# Patient Record
Sex: Female | Born: 1955
Health system: Southern US, Community
[De-identification: ages and names within clinical notes are randomized; demographics above are authoritative.]

## PROBLEM LIST (undated history)

## (undated) DIAGNOSIS — D649 Anemia, unspecified: Secondary | ICD-10-CM

## (undated) DIAGNOSIS — Z972 Presence of dental prosthetic device (complete) (partial): Secondary | ICD-10-CM

## (undated) DIAGNOSIS — Z5189 Encounter for other specified aftercare: Secondary | ICD-10-CM

## (undated) DIAGNOSIS — F419 Anxiety disorder, unspecified: Secondary | ICD-10-CM

## (undated) DIAGNOSIS — I10 Essential (primary) hypertension: Secondary | ICD-10-CM

## (undated) DIAGNOSIS — K219 Gastro-esophageal reflux disease without esophagitis: Secondary | ICD-10-CM

## (undated) DIAGNOSIS — E7800 Pure hypercholesterolemia, unspecified: Secondary | ICD-10-CM

## (undated) DIAGNOSIS — M1990 Unspecified osteoarthritis, unspecified site: Secondary | ICD-10-CM

## (undated) DIAGNOSIS — C50919 Malignant neoplasm of unspecified site of unspecified female breast: Secondary | ICD-10-CM

## (undated) DIAGNOSIS — Z923 Personal history of irradiation: Secondary | ICD-10-CM

## (undated) HISTORY — PX: COSMETIC SURGERY: SHX468

## (undated) HISTORY — PX: REDUCTION MAMMAPLASTY: SUR839

## (undated) HISTORY — PX: ABDOMINAL HYSTERECTOMY: SHX81

## (undated) HISTORY — DX: Anxiety disorder, unspecified: F41.9

## (undated) HISTORY — PX: COLONOSCOPY: SHX174

## (undated) HISTORY — DX: Unspecified osteoarthritis, unspecified site: M19.90

## (undated) HISTORY — PX: EYE SURGERY: SHX253

## (undated) HISTORY — DX: Encounter for other specified aftercare: Z51.89

## (undated) HISTORY — DX: Essential (primary) hypertension: I10

## (undated) HISTORY — PX: FOOT SURGERY: SHX648

## (undated) HISTORY — DX: Malignant neoplasm of unspecified site of unspecified female breast: C50.919

## (undated) HISTORY — PX: TUBAL LIGATION: SHX77

## (undated) HISTORY — DX: Anemia, unspecified: D64.9

## (undated) HISTORY — DX: Pure hypercholesterolemia, unspecified: E78.00

## (undated) HISTORY — PX: BREAST SURGERY: SHX581

---

## 2000-02-04 ENCOUNTER — Other Ambulatory Visit: Admission: RE | Admit: 2000-02-04 | Discharge: 2000-02-04 | Payer: Self-pay | Admitting: Obstetrics and Gynecology

## 2001-03-30 ENCOUNTER — Ambulatory Visit (HOSPITAL_COMMUNITY): Admission: RE | Admit: 2001-03-30 | Discharge: 2001-03-30 | Payer: Self-pay | Admitting: Obstetrics and Gynecology

## 2001-03-30 ENCOUNTER — Encounter: Payer: Self-pay | Admitting: Obstetrics and Gynecology

## 2001-04-29 ENCOUNTER — Encounter: Admission: RE | Admit: 2001-04-29 | Discharge: 2001-04-29 | Payer: Self-pay | Admitting: Otolaryngology

## 2001-04-29 ENCOUNTER — Encounter: Payer: Self-pay | Admitting: Obstetrics and Gynecology

## 2002-01-20 ENCOUNTER — Other Ambulatory Visit: Admission: RE | Admit: 2002-01-20 | Discharge: 2002-01-20 | Payer: Self-pay | Admitting: Obstetrics and Gynecology

## 2002-12-28 ENCOUNTER — Encounter: Payer: Self-pay | Admitting: Obstetrics and Gynecology

## 2002-12-28 ENCOUNTER — Encounter: Admission: RE | Admit: 2002-12-28 | Discharge: 2002-12-28 | Payer: Self-pay | Admitting: Obstetrics and Gynecology

## 2004-01-16 ENCOUNTER — Encounter: Admission: RE | Admit: 2004-01-16 | Discharge: 2004-01-16 | Payer: Self-pay | Admitting: Obstetrics and Gynecology

## 2005-02-04 ENCOUNTER — Encounter: Admission: RE | Admit: 2005-02-04 | Discharge: 2005-02-04 | Payer: Self-pay | Admitting: Obstetrics and Gynecology

## 2005-02-12 ENCOUNTER — Ambulatory Visit: Admission: RE | Admit: 2005-02-12 | Discharge: 2005-02-12 | Payer: Self-pay | Admitting: Gynecologic Oncology

## 2006-02-12 ENCOUNTER — Encounter: Admission: RE | Admit: 2006-02-12 | Discharge: 2006-02-12 | Payer: Self-pay | Admitting: Obstetrics and Gynecology

## 2006-09-16 DIAGNOSIS — Z923 Personal history of irradiation: Secondary | ICD-10-CM

## 2006-09-16 HISTORY — DX: Personal history of irradiation: Z92.3

## 2007-02-16 ENCOUNTER — Encounter: Admission: RE | Admit: 2007-02-16 | Discharge: 2007-02-16 | Payer: Self-pay | Admitting: *Deleted

## 2007-02-27 ENCOUNTER — Encounter (INDEPENDENT_AMBULATORY_CARE_PROVIDER_SITE_OTHER): Payer: Self-pay | Admitting: Diagnostic Radiology

## 2007-02-27 ENCOUNTER — Encounter: Admission: RE | Admit: 2007-02-27 | Discharge: 2007-02-27 | Payer: Self-pay | Admitting: Obstetrics and Gynecology

## 2007-02-27 DIAGNOSIS — C50919 Malignant neoplasm of unspecified site of unspecified female breast: Secondary | ICD-10-CM

## 2007-02-27 HISTORY — DX: Malignant neoplasm of unspecified site of unspecified female breast: C50.919

## 2007-02-27 HISTORY — PX: BREAST BIOPSY: SHX20

## 2007-03-09 ENCOUNTER — Encounter: Admission: RE | Admit: 2007-03-09 | Discharge: 2007-03-09 | Payer: Self-pay | Admitting: Obstetrics and Gynecology

## 2007-04-02 ENCOUNTER — Encounter (INDEPENDENT_AMBULATORY_CARE_PROVIDER_SITE_OTHER): Payer: Self-pay | Admitting: Surgery

## 2007-04-02 ENCOUNTER — Ambulatory Visit (HOSPITAL_COMMUNITY): Admission: RE | Admit: 2007-04-02 | Discharge: 2007-04-02 | Payer: Self-pay | Admitting: Surgery

## 2007-04-02 ENCOUNTER — Encounter: Admission: RE | Admit: 2007-04-02 | Discharge: 2007-04-02 | Payer: Self-pay | Admitting: Surgery

## 2007-04-02 ENCOUNTER — Ambulatory Visit (HOSPITAL_BASED_OUTPATIENT_CLINIC_OR_DEPARTMENT_OTHER): Admission: RE | Admit: 2007-04-02 | Discharge: 2007-04-02 | Payer: Self-pay | Admitting: Surgery

## 2007-04-02 HISTORY — PX: BREAST LUMPECTOMY: SHX2

## 2007-04-17 ENCOUNTER — Ambulatory Visit: Payer: Self-pay | Admitting: Radiation Oncology

## 2007-04-28 ENCOUNTER — Ambulatory Visit: Payer: Self-pay | Admitting: Oncology

## 2007-05-04 LAB — CBC WITH DIFFERENTIAL (CANCER CENTER ONLY)
BASO%: 0.4 % (ref 0.0–2.0)
HCT: 33.8 % — ABNORMAL LOW (ref 34.8–46.6)
LYMPH#: 1.4 10*3/uL (ref 0.9–3.3)
MONO#: 0.4 10*3/uL (ref 0.1–0.9)
Platelets: 238 10*3/uL (ref 145–400)
RDW: 12.2 % (ref 10.5–14.6)
WBC: 7.2 10*3/uL (ref 3.9–10.0)

## 2007-05-04 LAB — COMPREHENSIVE METABOLIC PANEL
ALT: 22 U/L (ref 0–35)
CO2: 28 mEq/L (ref 19–32)
Calcium: 8.9 mg/dL (ref 8.4–10.5)
Chloride: 99 mEq/L (ref 96–112)
Creatinine, Ser: 0.65 mg/dL (ref 0.40–1.20)
Glucose, Bld: 158 mg/dL — ABNORMAL HIGH (ref 70–99)
Total Bilirubin: 0.5 mg/dL (ref 0.3–1.2)
Total Protein: 6.3 g/dL (ref 6.0–8.3)

## 2007-05-11 ENCOUNTER — Ambulatory Visit: Payer: Self-pay | Admitting: Radiation Oncology

## 2007-05-18 ENCOUNTER — Ambulatory Visit: Payer: Self-pay | Admitting: Radiation Oncology

## 2007-06-04 LAB — CBC WITH DIFFERENTIAL (CANCER CENTER ONLY)
BASO#: 0 10*3/uL (ref 0.0–0.2)
BASO%: 0.4 % (ref 0.0–2.0)
HCT: 38 % (ref 34.8–46.6)
HGB: 12.8 g/dL (ref 11.6–15.9)
LYMPH#: 1.3 10*3/uL (ref 0.9–3.3)
MONO#: 0.4 10*3/uL (ref 0.1–0.9)
NEUT%: 69.5 % (ref 39.6–80.0)
WBC: 5.8 10*3/uL (ref 3.9–10.0)

## 2007-06-04 LAB — BASIC METABOLIC PANEL
Calcium: 9.5 mg/dL (ref 8.4–10.5)
Potassium: 3.7 mEq/L (ref 3.5–5.3)
Sodium: 140 mEq/L (ref 135–145)

## 2007-06-17 ENCOUNTER — Ambulatory Visit: Payer: Self-pay | Admitting: Radiation Oncology

## 2007-07-17 ENCOUNTER — Ambulatory Visit: Payer: Self-pay | Admitting: Oncology

## 2007-07-18 ENCOUNTER — Ambulatory Visit: Payer: Self-pay | Admitting: Radiation Oncology

## 2007-07-20 LAB — COMPREHENSIVE METABOLIC PANEL
ALT: 23 U/L (ref 0–35)
Albumin: 3.9 g/dL (ref 3.5–5.2)
CO2: 33 mEq/L — ABNORMAL HIGH (ref 19–32)
Calcium: 9.3 mg/dL (ref 8.4–10.5)
Chloride: 104 mEq/L (ref 96–112)
Glucose, Bld: 103 mg/dL — ABNORMAL HIGH (ref 70–99)
Sodium: 141 mEq/L (ref 135–145)
Total Protein: 6.6 g/dL (ref 6.0–8.3)

## 2007-07-20 LAB — CBC WITH DIFFERENTIAL (CANCER CENTER ONLY)
BASO#: 0 10*3/uL (ref 0.0–0.2)
EOS%: 2.7 % (ref 0.0–7.0)
Eosinophils Absolute: 0.1 10*3/uL (ref 0.0–0.5)
HCT: 36.4 % (ref 34.8–46.6)
HGB: 12.4 g/dL (ref 11.6–15.9)
MCH: 29.3 pg (ref 26.0–34.0)
MCHC: 34 g/dL (ref 32.0–36.0)
MONO%: 9.2 % (ref 0.0–13.0)
NEUT%: 69.2 % (ref 39.6–80.0)
RBC: 4.22 10*6/uL (ref 3.70–5.32)

## 2007-09-03 ENCOUNTER — Ambulatory Visit: Payer: Self-pay | Admitting: Radiation Oncology

## 2007-09-17 ENCOUNTER — Ambulatory Visit: Payer: Self-pay | Admitting: Radiation Oncology

## 2007-09-22 ENCOUNTER — Ambulatory Visit: Payer: Self-pay | Admitting: Oncology

## 2007-09-24 LAB — CMP (CANCER CENTER ONLY)
ALT(SGPT): 27 U/L (ref 10–47)
AST: 31 U/L (ref 11–38)
Alkaline Phosphatase: 35 U/L (ref 26–84)
Potassium: 3.9 mEq/L (ref 3.3–4.7)
Sodium: 136 mEq/L (ref 128–145)
Total Bilirubin: 0.5 mg/dl (ref 0.20–1.60)
Total Protein: 7.1 g/dL (ref 6.4–8.1)

## 2007-09-24 LAB — CBC WITH DIFFERENTIAL (CANCER CENTER ONLY)
BASO#: 0 10*3/uL (ref 0.0–0.2)
EOS%: 1.8 % (ref 0.0–7.0)
Eosinophils Absolute: 0.1 10*3/uL (ref 0.0–0.5)
HCT: 34.4 % — ABNORMAL LOW (ref 34.8–46.6)
HGB: 11.7 g/dL (ref 11.6–15.9)
LYMPH#: 0.9 10*3/uL (ref 0.9–3.3)
MCH: 29.1 pg (ref 26.0–34.0)
MCHC: 33.9 g/dL (ref 32.0–36.0)
NEUT%: 72.6 % (ref 39.6–80.0)

## 2008-01-22 ENCOUNTER — Other Ambulatory Visit: Admission: RE | Admit: 2008-01-22 | Discharge: 2008-01-22 | Payer: Self-pay | Admitting: Obstetrics and Gynecology

## 2008-01-25 ENCOUNTER — Encounter: Admission: RE | Admit: 2008-01-25 | Discharge: 2008-01-25 | Payer: Self-pay | Admitting: Obstetrics and Gynecology

## 2008-02-03 ENCOUNTER — Ambulatory Visit: Payer: Self-pay | Admitting: Internal Medicine

## 2008-02-17 ENCOUNTER — Ambulatory Visit: Payer: Self-pay | Admitting: Internal Medicine

## 2008-02-22 ENCOUNTER — Encounter: Admission: RE | Admit: 2008-02-22 | Discharge: 2008-02-22 | Payer: Self-pay | Admitting: Oncology

## 2008-07-15 ENCOUNTER — Ambulatory Visit: Payer: Self-pay | Admitting: Oncology

## 2008-07-18 LAB — CMP (CANCER CENTER ONLY)
ALT(SGPT): 16 U/L (ref 10–47)
Alkaline Phosphatase: 46 U/L (ref 26–84)
CO2: 29 mEq/L (ref 18–33)
Creat: 0.8 mg/dl (ref 0.6–1.2)
Sodium: 138 mEq/L (ref 128–145)
Total Bilirubin: 0.4 mg/dl (ref 0.20–1.60)
Total Protein: 7 g/dL (ref 6.4–8.1)

## 2008-07-18 LAB — CBC WITH DIFFERENTIAL (CANCER CENTER ONLY)
BASO%: 0.4 % (ref 0.0–2.0)
LYMPH%: 27.1 % (ref 14.0–48.0)
MCV: 87 fL (ref 81–101)
MONO#: 0.3 10*3/uL (ref 0.1–0.9)
MONO%: 6 % (ref 0.0–13.0)
Platelets: 223 10*3/uL (ref 145–400)
RDW: 12.6 % (ref 10.5–14.6)
WBC: 4.7 10*3/uL (ref 3.9–10.0)

## 2008-07-18 LAB — CANCER ANTIGEN 27.29: CA 27.29: 10 U/mL (ref 0–39)

## 2008-10-25 ENCOUNTER — Ambulatory Visit: Payer: Self-pay | Admitting: Nurse Practitioner

## 2008-10-25 ENCOUNTER — Encounter: Payer: Self-pay | Admitting: Family Medicine

## 2008-10-25 ENCOUNTER — Encounter: Payer: Self-pay | Admitting: Nurse Practitioner

## 2008-10-25 LAB — CONVERTED CEMR LAB
ALT: 18 units/L (ref 0–35)
BUN: 15 mg/dL (ref 6–23)
Chloride: 103 meq/L (ref 96–112)
Cholesterol: 224 mg/dL — ABNORMAL HIGH (ref 0–200)
Creatinine, Ser: 0.67 mg/dL (ref 0.40–1.20)
Glucose, Bld: 100 mg/dL — ABNORMAL HIGH (ref 70–99)
Hemoglobin: 12.5 g/dL (ref 12.0–15.0)
LDL Cholesterol: 121 mg/dL — ABNORMAL HIGH (ref 0–99)
MCV: 87.7 fL (ref 78.0–100.0)
Platelets: 204 10*3/uL (ref 150–400)
RBC: 4.46 M/uL (ref 3.87–5.11)
TSH: 1.511 microintl units/mL (ref 0.350–4.50)
Total Bilirubin: 0.4 mg/dL (ref 0.3–1.2)
Total CHOL/HDL Ratio: 3.9
Triglycerides: 229 mg/dL — ABNORMAL HIGH (ref <150)
VLDL: 46 mg/dL — ABNORMAL HIGH (ref 0–40)

## 2008-10-28 ENCOUNTER — Encounter: Payer: Self-pay | Admitting: Family Medicine

## 2008-10-28 LAB — CONVERTED CEMR LAB: Vit D, 1,25-Dihydroxy: 21 — ABNORMAL LOW (ref 30–89)

## 2008-11-07 ENCOUNTER — Ambulatory Visit (HOSPITAL_COMMUNITY): Admission: RE | Admit: 2008-11-07 | Discharge: 2008-11-07 | Payer: Self-pay | Admitting: Obstetrics & Gynecology

## 2009-01-10 ENCOUNTER — Ambulatory Visit: Payer: Self-pay | Admitting: Obstetrics & Gynecology

## 2009-01-19 ENCOUNTER — Ambulatory Visit: Payer: Self-pay | Admitting: Oncology

## 2009-02-02 ENCOUNTER — Ambulatory Visit: Payer: Self-pay | Admitting: Obstetrics & Gynecology

## 2009-03-06 ENCOUNTER — Encounter: Admission: RE | Admit: 2009-03-06 | Discharge: 2009-03-06 | Payer: Self-pay | Admitting: Obstetrics & Gynecology

## 2009-04-20 ENCOUNTER — Ambulatory Visit: Payer: Self-pay | Admitting: Oncology

## 2009-04-24 LAB — CBC WITH DIFFERENTIAL (CANCER CENTER ONLY)
EOS%: 1.8 % (ref 0.0–7.0)
Eosinophils Absolute: 0.1 10*3/uL (ref 0.0–0.5)
LYMPH%: 32.9 % (ref 14.0–48.0)
MCH: 28.6 pg (ref 26.0–34.0)
MCHC: 34 g/dL (ref 32.0–36.0)
MCV: 84 fL (ref 81–101)
MONO%: 5.9 % (ref 0.0–13.0)
Platelets: 212 10*3/uL (ref 145–400)
RBC: 4.14 10*6/uL (ref 3.70–5.32)
RDW: 12.5 % (ref 10.5–14.6)

## 2009-04-24 LAB — CMP (CANCER CENTER ONLY)
AST: 34 U/L (ref 11–38)
Albumin: 3.2 g/dL — ABNORMAL LOW (ref 3.3–5.5)
Alkaline Phosphatase: 51 U/L (ref 26–84)
Total Bilirubin: 0.4 mg/dl (ref 0.20–1.60)
Total Protein: 6.9 g/dL (ref 6.4–8.1)

## 2009-04-25 ENCOUNTER — Ambulatory Visit: Payer: Self-pay | Admitting: Nurse Practitioner

## 2009-06-20 ENCOUNTER — Ambulatory Visit: Payer: Self-pay | Admitting: Obstetrics & Gynecology

## 2009-09-22 ENCOUNTER — Ambulatory Visit: Payer: Self-pay | Admitting: Oncology

## 2009-10-10 ENCOUNTER — Ambulatory Visit: Payer: Self-pay | Admitting: Nurse Practitioner

## 2009-10-18 LAB — CBC WITH DIFFERENTIAL (CANCER CENTER ONLY)
HGB: 12.2 g/dL (ref 11.6–15.9)
LYMPH#: 1.3 10*3/uL (ref 0.9–3.3)
LYMPH%: 29.4 % (ref 14.0–48.0)
MCH: 29.7 pg (ref 26.0–34.0)
MCHC: 33.8 g/dL (ref 32.0–36.0)
NEUT#: 2.7 10*3/uL (ref 1.5–6.5)
NEUT%: 60.2 % (ref 39.6–80.0)
Platelets: 212 10*3/uL (ref 145–400)
RDW: 11.9 % (ref 10.5–14.6)
WBC: 4.4 10*3/uL (ref 3.9–10.0)

## 2009-10-18 LAB — CMP (CANCER CENTER ONLY)
Calcium: 9.5 mg/dL (ref 8.0–10.3)
Chloride: 101 mEq/L (ref 98–108)
Potassium: 3.9 mEq/L (ref 3.3–4.7)
Sodium: 139 mEq/L (ref 128–145)
Total Bilirubin: 0.5 mg/dl (ref 0.20–1.60)
Total Protein: 7.5 g/dL (ref 6.4–8.1)

## 2009-10-24 ENCOUNTER — Ambulatory Visit: Payer: Self-pay | Admitting: Obstetrics and Gynecology

## 2009-10-24 ENCOUNTER — Encounter: Payer: Self-pay | Admitting: Family Medicine

## 2009-10-24 LAB — CONVERTED CEMR LAB
Albumin: 4.1 g/dL (ref 3.5–5.2)
Calcium: 9.5 mg/dL (ref 8.4–10.5)
Cholesterol: 221 mg/dL — ABNORMAL HIGH (ref 0–200)
HDL: 59 mg/dL (ref >39)
LDL Cholesterol: 132 mg/dL — ABNORMAL HIGH (ref 0–99)
Sodium: 143 meq/L (ref 135–145)
Total CHOL/HDL Ratio: 3.7
Total Protein: 7 g/dL (ref 6.0–8.3)
Triglycerides: 151 mg/dL — ABNORMAL HIGH (ref <150)
VLDL: 30 mg/dL (ref 0–40)

## 2009-11-08 ENCOUNTER — Ambulatory Visit (HOSPITAL_COMMUNITY): Admission: RE | Admit: 2009-11-08 | Discharge: 2009-11-08 | Payer: Self-pay | Admitting: Gynecology

## 2010-03-13 ENCOUNTER — Encounter: Admission: RE | Admit: 2010-03-13 | Discharge: 2010-03-13 | Payer: Self-pay | Admitting: Obstetrics & Gynecology

## 2010-04-20 ENCOUNTER — Ambulatory Visit: Payer: Self-pay | Admitting: Oncology

## 2010-05-25 ENCOUNTER — Ambulatory Visit: Payer: Self-pay | Admitting: Oncology

## 2010-11-27 ENCOUNTER — Ambulatory Visit: Payer: PRIVATE HEALTH INSURANCE | Admitting: Nurse Practitioner

## 2010-11-27 DIAGNOSIS — N760 Acute vaginitis: Secondary | ICD-10-CM

## 2010-11-27 DIAGNOSIS — G43109 Migraine with aura, not intractable, without status migrainosus: Secondary | ICD-10-CM

## 2010-11-27 DIAGNOSIS — D493 Neoplasm of unspecified behavior of breast: Secondary | ICD-10-CM

## 2010-11-28 ENCOUNTER — Encounter: Payer: Self-pay | Admitting: Family Medicine

## 2010-11-28 LAB — CONVERTED CEMR LAB
Trich, Wet Prep: NONE SEEN
Yeast Wet Prep HPF POC: NONE SEEN

## 2010-12-06 ENCOUNTER — Encounter (INDEPENDENT_AMBULATORY_CARE_PROVIDER_SITE_OTHER): Payer: Self-pay | Admitting: *Deleted

## 2010-12-06 LAB — CONVERTED CEMR LAB
FSH: 36 milliintl units/mL
LDL Cholesterol: 129 mg/dL — ABNORMAL HIGH (ref 0–99)
TSH: 1.893 microintl units/mL (ref 0.350–4.500)
Triglycerides: 225 mg/dL — ABNORMAL HIGH (ref <150)
VLDL: 45 mg/dL — ABNORMAL HIGH (ref 0–40)

## 2010-12-07 NOTE — Assessment & Plan Note (Unsigned)
NAME:  [name], [name] NO.:  [id number]  MEDICAL RECORD NO.:  [id number]           PATIENT TYPE:  LOCATION:  [facility]           FACILITY:  PHYSICIAN:  Allie Bossier, MD             DATE OF BIRTH:  DATE OF SERVICE:  11/27/2010                                 CLINIC NOTE  Patient comes to office today for her annual exam.  Patient was seen in February of 2011 for her last yearly exam.  At that time, she had requested that we check her cholesterol which at that time was 221, triglycerides 151, and LDL was 132.  Her TSH was normal.  She does have a family history of thyroid problems and her other labs were normal.  We did on that visit go up on her Wellbutrin to 150.  She feels that her symptoms of depression have improved and her headaches are significantly better, in fact she cannot remember the last time she took her Zomig. She has been keeping all of her followup appointments with her mammograms and with her oncologist, Dr. Welton Flakes, for her stage 0 breast cancer that was diagnosed 4 years ago.  She has been on the tamoxifen. She is continuing to have side effects that include weight gain and vaginal dryness.  The vaginal dryness the result of lack of estrogen in her during her menopause.  She did have a hysterectomy many years ago for noncancerous reasons.  She has had some significant complaints of vaginal dryness that interfere with intercourse.  She did also have an ultrasound last year for a followup on her right simple cyst.  They did advise that she have that repeated in 1 year which we will do today. Otherwise, patient is doing well.  Her job remains very stressful. 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  She has been working too much, not exercising enough, continues to drink sweetened tea all day long for which she feels that she has had the weight gain.  PHYSICAL EXAM:  Well-developed, well-nourished, 55 year old, Caucasian female in no  acute distress. HEENT:  Head is normocephalic and atraumatic.  Pupils equal and reactive.  Thyroid is without enlargement or nodules. BREASTS:  Not symmetrical.  The left breast is larger than the right breast.  There is no mass.  There is no skin dimpling.  No nipple discharge. ABDOMEN:  Soft, nontender.  She does have a well-healed old scar. GENITALIA:  Externally there are no lesions or discharges.  Internally, vaginal vault, there is a thin, white, nonodorous discharge.  The cervix is absent.  Bimanual exam, no adnexal mass. EXTREMITIES:  Warm and dry.  ASSESSMENT: 1. Yearly annual exam. 2. Depression. 3. Migraine. 4. Menopause. 5. Right ovarian cyst.  PLAN:  We will recheck her lipid panel today and her TSH.  She will get refills on her Wellbutrin, Zomig, Ambien, Macrobid.  Patient is given today 3 samples of Estrace and a prescription for Estrace 0.5 g 3 times per week.  This was discussed with Dr. Marice Potter.  She is encouraged to keep her appointments with Dr. Welton Flakes.  We will do a repeat ultrasound  for her right ovarian cyst.  Patient is encouraged to exercise daily, decrease her sugar intake.  She will follow up in 3 months and we will review her issues.     [name], NP   ______________________________ Allie Bossier, MD   LR/MEDQ  D:  11/27/2010  T:  11/27/2010  Job:  [id number]

## 2010-12-11 ENCOUNTER — Other Ambulatory Visit: Payer: Self-pay | Admitting: Nurse Practitioner

## 2010-12-11 DIAGNOSIS — Z9889 Other specified postprocedural states: Secondary | ICD-10-CM

## 2011-01-29 NOTE — Assessment & Plan Note (Signed)
NAME:  [name], [name] NO.:  [id number]   MEDICAL RECORD NO.:  [id number]          PATIENT TYPE:  POB   LOCATION:  [facility]         FACILITY:  [facility]   PHYSICIAN:  Argentina Donovan, MD        DATE OF BIRTH:  1956-09-15   DATE OF SERVICE:  10/25/2008                                  CLINIC NOTE   The patient comes to office today for her yearly well-woman exam.  This  patient is well known to me from years of taking care of her with  headaches.  She is doing significantly better with her headaches and is  not here for that today.  Since I did see her last, she has developed a  breast cancer in 2008.  She has had a lumpectomy as well as radiation  therapy and done very well.  She continues to be followed by her  oncologist and is currently on tamoxifen 20 mg per day that she seems to  be doing very well on.  She does feel that the tamoxifen is causing her  to gain some weight and possibly to cause her to have some fatigue.  She  has had ongoing fatigue for the last 4-6 months.  She has discussed this  with her oncologist several times.  She has had blood drawn from her  oncologist.  She currently has a job that is physical in nature.  She  does housecleaning.  She has also helped her husband out who has  business and she cares for her children and grandchildren.  She feels  that she is sleeping more than normal.  She is currently sleeping 8 or 9  hours per night.  Apparently, the oncologist had some question about her  thyroid, and she has had an ultrasound of her thyroid in the last 1  year.   ALLERGIES:  No known allergies.   CURRENT MEDICATIONS:  Tamoxifen and Effexor.   MENSTRUAL HISTORY:  The patient has started her period at the age of 75,  contraceptive.  She had a hysterectomy in 1983 for anemia related to  blood loss.  She does continue to have her ovaries.   OBSTETRICAL HISTORY:  G2, P2.  Last Pap smear was in July 2009.  She has  never had an  abnormal.   FAMILY HISTORY:  Diabetes in grandmother.  High blood pressure in mother  and father.  Cancer in father's sister.  Mother has thyroid disease.   PERSONAL HISTORY:  The patient has a longstanding history of migraine  and anxiety as well as elevated cholesterol and breast cancer.  Socially, the patient lives alone at home with her husband.  She does  work outside of the home.  She does not smoke.  She does not drink.  She  drinks 3 caffeinated beverages per day.  She has not been sexually  abused.   REVIEW OF SYSTEMS:  The patient is positive for numbness, weakness in  fingers, fatigue, dizziness, hot flashes.  Negative for muscle aches,  headaches, problems with vision, chest pain.   PHYSICAL EXAMINATION:  GENERAL:  Well-developed, well-nourished 55-year-  old Caucasian female in no  acute distress.  VITAL SIGNS:  Blood pressure is 141/84, weight is 161, height is 5 feet  5, pulse is 82.  HEENT:  Head is normocephalic and atraumatic.  Thyroid is without  nodules or enlargement.  CARDIAC:  Regular rate and rhythm.  LUNGS:  Clear bilaterally.  BREAST:  The patient does have a well-healed scar on her right breast.  She does have some hardening of the breast tissue on her right breast,  negative on her left breast.  ABDOMEN:  Soft, nontender.  She has a well-healed scar from an old  hysterectomy.  PELVIC:  Externally, genitalia is without lesions.  Vaginal vault was  with a small amount of white discharge.  There is no cervix present.  Bimanual exam, there was no mass appreciated.  EXTREMITIES:  There is no bruising or clubbing of her extremities.   ASSESSMENT:  1. Status post right breast cancer.  2. Migraine headaches.  3. Fatigue.  4. Well-woman exam.   PLAN:  The patient has requested to refill her on Zomig 5 mg as well as  her Phenergan and that she will be given prescriptions for both of  those.  We will check her TSH, CBC, CMET, cholesterol today.  The  patient  also requesting a DEXA scan.  The patient is encouraged to  follow up with her primary care physician for her ongoing fatigue.  We  did note today that her blood pressure is slightly elevated.  She is  encouraged to purchase a blood pressure cuff and monitor her blood  pressure that it remains elevated, she will see her primary care  physician.  She will return here in 1 year for her annual exam.      Leah Richter, NP    ______________________________  Argentina Donovan, MD    LR/MEDQ  D:  10/25/2008  T:  10/26/2008  Job:  [id number]

## 2011-01-29 NOTE — Assessment & Plan Note (Signed)
NAME:  [name], [name]                ACCOUNT NO.:  [id number]   MEDICAL RECORD NO.:  [id number]          PATIENT TYPE:  POB   LOCATION:  [facility]         FACILITY:  [facility]   PHYSICIAN:  Allie Bossier, MD        DATE OF BIRTH:  Oct 05, 1955   DATE OF SERVICE:                                  CLINIC NOTE   [name] is a 54 year old married white gravida 2, para 2 with 2  grand daughters and 3 grand kids.  She comes in today for complaints  associated with depression.  She feels sad and tired.  She also says  that she feels great difficulty getting motivated.  She tells me about  many life stressors and changes in her life including the somewhat  recent death of her 22 year old father from multiple medical problems,  the fact that she is now.  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  In  addition to these complaints, she also says that in the recent months,  she has been having night sweats 3-4 times at the night as well as a few  daily hot flashes.  When these problems began bothering her months ago,  she spoke with [name] who treated here for her headaches.  On  Linda's recommendations, she increased her Effexor from 75 mg a day to  150 mg a day, and in fact she went further than this and increased it to  225 mg a day for several weeks.  After several months of no change with  increasing the dose, she is now back to her 75 mg a day.  She does say  that her headaches are  rare and that she will use a Zomig on a p.r.n.  basis and this takes away her migraines when it began.  On further  elucidation, she tells me that she has a history of periods of  depression and has actually been on Prozac in the past.  I certainly  believe that she is having a periods of depression right now, which  compounded by her menopausal symptoms.  Of  note, she had breast cancer  diagnosed in 2008 and was started on tamoxifen at that time.  However,  she cannot describe the hot flashes until more recently.  We have  recommended is that we start a SSRI in the form of Prozac 20 mg daily in  the morning.  I have explained that it will take several weeks for this  for the affect to be felt, but should she have problems she should call  sooner, otherwise she will be seen back here in approximately 3 weeks,  and we will adjust her medicines as necessary.  In the long-term, I  would like to have her off of her Effexor since I would prefer to only  use 1 agent if possible.  We will titrate her medicines as necessary.      Allie Bossier, MD     MCD/MEDQ  D:  01/10/2009  T:  01/11/2009  Job:  [id number]

## 2011-01-29 NOTE — Assessment & Plan Note (Signed)
NAME:  TAMEEKA, LUO NO.:  [id number]   MEDICAL RECORD NO.:  [id number]          PATIENT TYPE:  POB   LOCATION:  [facility]         FACILITY:  [facility]   PHYSICIAN:  Elsie Lincoln, MD      DATE OF BIRTH:  Dec 02, 1955   DATE OF SERVICE:  02/02/2009                                  CLINIC NOTE   The patient is a 55 year old female who presents for followup of  depression.  The patient is started on Prozac 20 mg.  She is still on  the Effexor 75 mg for headache prophylaxis.  Dr. Marice Potter talked about  before I think that we should wean the Effexor down, given that it does  have SSRI component.  The patient is doing much better on the Prozac.  She has mild anorgasmia that hopefully will be relieved with the Effexor  being discontinued.  She has increased energy and feels better in  general.  We will keep the Prozac at 20 mg and if she need to increase  she will call us, we can just increase the dose to 40 mg.  Otherwise the  patient will return in 3 months just for a check in.           ______________________________  Elsie Lincoln, MD     KL/MEDQ  D:  02/02/2009  T:  02/02/2009  Job:  [id number]

## 2011-01-29 NOTE — Op Note (Signed)
NAME:  [name], [name]                ACCOUNT NO.:  [id number]   MEDICAL RECORD NO.:  [id number]          PATIENT TYPE:  AMB   LOCATION:  [facility]                          FACILITY:  [facility]   PHYSICIAN:  Thomas A. Cornett, M.D.DATE OF BIRTH:  [birth year]   DATE OF PROCEDURE:  04/02/2007  DATE OF DISCHARGE:                               OPERATIVE REPORT   PREOPERATIVE DIAGNOSIS:  Right breast ductal carcinoma in situ in the  upper outer quadrant.   POSTOPERATIVE DIAGNOSIS:  Right breast ductal carcinoma in situ in the  upper outer quadrant.   PROCEDURE:  1. Right breast needle-localized lumpectomy.  2. Right lymph node mapping with injection of methylene blue dye.   SURGEON:  Dr. Harriette Bouillon.   ANESTHESIA:  LMA with 30 mL of 0.25% Sensorcaine local.   ESTIMATED BLOOD LOSS:  10 mL.   SPECIMEN:  1. Right breast tissue with calcifications and localizing wire      verified by radiograph.  2. Right sentinel node negative by touch prep.   INDICATIONS FOR PROCEDURE:  The patient is a 55 year old female who was  found to have calcifications in her right upper outer quadrant.  Core  biopsy showed DCIS.  I recommended a lumpectomy to her since she wanted  to conserve her breast with needle localization.  Given the location in  the upper outer quadrant, I felt the sentinel node would be worth our  while during this time since it would be difficult, she does have an  invasive malignancy to do a __________. She was okay with that and we  decided to agree to do it at the same time.   DESCRIPTION OF PROCEDURE:  After the patient underwent injection by  nuclear medicine and localization by the radiologist, she was taken back  to the operating suite.  The right breast was prepped and draped in a  sterile fashion.  The sentinel node was done first.  A Neoprobe was  used.  I injected 4 mL of methylene blue dye in a subareolar position  and massaged for 5 minutes.  A Neoprobe was used and a site  was  localized in the right axilla.  An incision was made in the right  axilla, dissection was carried down and a hot blue node was found and  removed.  There were no other hot blue nodes in the axilla.  Touch preps  revealed this to be negative.   Next the right lumpectomy was done. Localizing wire came out of the  right upper outer quadrant.  A curvilinear incision was made after  injection of local anesthesia and tissue around the wire was excised in  its entirety.  This was sent to radiology and found to be adequate by  radiography.  The pathologist then received the specimen for permanents.  Irrigation was used and suctioned out of both wounds.  Hemostasis was  excellent.  The sounds were  closed in layers with 3-0 Vicryl and 4-0 Monocryl in a subcuticular  fashion.  Dermabond was then placed and allowed to dry.  An Ace wrap was  placed.  All final counts of sponge, needle and instruments found to be  correct at this portion of the case.  The patient was awoke and taken to  recovery in satisfactory condition.      Thomas A. Cornett, M.D.  Electronically Signed     TAC/MEDQ  D:  04/02/2007  T:  04/03/2007  Job:  [id number]   cc:   Vonita Moss, MD

## 2011-01-29 NOTE — Assessment & Plan Note (Signed)
NAME:  [name], [name] NO.:  [id number]   MEDICAL RECORD NO.:  [id number]          PATIENT TYPE:  POB   LOCATION:  [facility]         FACILITY:  [facility]   PHYSICIAN:  Scheryl Darter, MD       DATE OF BIRTH:  Mar 28, 1956   DATE OF SERVICE:  04/25/2009                                  CLINIC NOTE   HISTORY:  The patient comes to the office today for a followup on her  depression and her migraines.  She also has another issue and that is of  chronic UTIs following intercourse and a need for prescription.  We will  begin with the depression.  The patient was weaned off her Effexor and  onto Prozac 20 mg.  She does feel that she is somewhat improved in her  depression symptoms.  She had been struggling with some problems  following her breast cancer and her father's death, but she does feel  that from a situational standpoint that she is improved.  She did at one  point try to go up to 40 mg on her Prozac, but had some difficulty with  sleep.  Her difficulty with sleep has been ongoing now for some time.  She cannot exactly say that it is related to the breast cancer and  tamoxifen, but feels that there may be some relationship.  She does not  have any problems falling asleep, but she has multiple awakening, she is  up at 2, 3, 4, and 5.  She is doing well with her migraine headache.  She usually takes a Zomig and it goes away without any difficulties.  She is also has some problems with her varicose veins.  At one point,  she thought it was restless leg, but she now believes that it is  varicose veins and she is considering treatment.   PHYSICAL EXAMINATION:  GENERAL:  Well-developed, well-nourished, 55-year-  old Caucasian female, in no acute distress.  VITAL SIGNS:  Blood pressure is 147/89.  Today, she has headache and has  taken a Zomig.  Her pulse is 78, weight is 161, height is 5 feet 4  inches.  HEENT:  Head is normocephalic and atraumatic.  NEUROLOGIC:   The patient is very anxious.  She has difficulty focusing  on 1 topic, but otherwise seems to be well coordinated with good muscle  sensation.   ASSESSMENT:  1. Depression.  2. Insomnia.  3. Migraine.  4. Chronic urinary tract infections.   PLAN:  We had a very lengthy discussion today concerning all of the  above issues.  The patient is doing well with her migraine.  She takes  Zomig and has good response from that.  From the standpoint of her  depression as she seems to be doing well on Prozac 20 mg, we will leave  her there.  She has the option of taking it timing wise whenever she  feels that it affects her sleep least.  She will be given Ambien 10 mg  one-half to 1 tablet p.o. p.r.n. insomnia with 3 refills if she can take  that as needed.  She was asked for and had been  given a prescription for  Macrobid 100 mg 1 p.o. p.r.n. following intercourse #30 with 6 refills.  This is a continuation of a prescription that  she has had for years.  The patient will see how all of this works  together and she will return in 6 weeks.  She can certainly call sooner  if she is having any problems.       [name], NP    ______________________________  Scheryl Darter, MD    LR/MEDQ  D:  04/25/2009  T:  04/26/2009  Job:  [id number]

## 2011-01-29 NOTE — Assessment & Plan Note (Signed)
NAME:  [name], [name] NO.:  [id number]   MEDICAL RECORD NO.:  [id number]          PATIENT TYPE:  POB   LOCATION:  [facility]         FACILITY:  [facility]   PHYSICIAN:  [name], NP   DATE OF BIRTH:  08/02/1956   DATE OF SERVICE:  10/10/2009                                  CLINIC NOTE   The patient comes to office today for medication consultation.  She was  last seen by me in October 2010.  She was doing well at that point  psychologically.  She came back in October and for some reason after  that visit, she decided to come off of her Prozac on her own.  Dr.  Macon Large did refer her to a psychiatrist, but her psychiatrist seen would  not be paid by her medical insurance and so she did not go.  She does  feel that she is having some worsening of her emotional issues secondary  to the tamoxifen and being off the Prozac.  She has also had some  changes at work and at home that have been difficult for her.  She does  have a longstanding history of depression, anxiety.  She has been doing  very well with her headaches recently.   PHYSICAL EXAMINATION:  VITAL SIGNS:  Blood pressure is 128/86, weight  168, height 5 feet 4-1/2, pulse is 86.  GENERAL:  Well-developed, well-nourished 55 year old Caucasian female in  no acute distress.   ASSESSMENT:  Depression.   PLAN:  We had a very lengthy discussion today concerning her issues  which are fatigue, depression, and weight gain.  She is also having  issues with hot flashes.  She does plan to visit her oncologist soon to  discuss her weight gain and her general physical symptoms.  We have  decided today to start her on Wellbutrin 100 mg one p.o. q.a.m. #30 with  three refills.  She will return to this clinic in 2-3 weeks for complete  physical exam.  At that time, we will also do her fasting labs.  Last  year her cholesterol was elevated as well as her triglycerides.  She has  not felt well enough to exercise  and in fact has gained 7 pounds, so we  will reevaluate after a physical.      [name], NP     LR/MEDQ  D:  10/10/2009  T:  10/11/2009  Job:  [id number]

## 2011-01-29 NOTE — Assessment & Plan Note (Signed)
NAME:  [name], [name] NO.:  [id number]   MEDICAL RECORD NO.:  [id number]          PATIENT TYPE:  POB   LOCATION:  [facility]         FACILITY:  [facility]   PHYSICIAN:  Catalina Antigua, MD     DATE OF BIRTH:  1956/03/22   DATE OF SERVICE:  10/24/2009                                  CLINIC NOTE   The patient comes to the office today for her yearly physical.  The  patient is interested in having her blood drawn today.  She does  routinely have her CBC drawn at her oncologist, but last year had  elevated cholesterol and elevated blood sugar.  She has attempted to  make some dietary changes.  She has not done a good job of exercise.  She has had some worsening of depression after getting off her  antidepressant.  She has also had some weight gain.  She was diagnosed  with ductal breast cancer in 2008.  She did have a lumpectomy as well as  some tissue removal.  She also had radiation to her right breast.  She  was seen 2 weeks ago by me for migraine as well as depression.  At that  time, we put her on Wellbutrin.  She was feeling that she had a rather  low energy and signs of depression.  She did have a colonoscopy in 2010,  which was negative.  She has never had a DEXA scan.  She was seeing Dr.  Tresa Res for her GYN care.  She with Dr. Tresa Res was being followed for  some ovarian cyst that has not been done in approximately 1 year.  The  patient has been having some vague tenderness in her right cyst  occasionally in her left.   ALLERGIES:  No known allergies.   PHYSICAL EXAMINATION:  VITAL SIGNS:  Blood pressure is 143/85, pulse is  81, weight 166, height is 5 feet 4-1/2 inches.   First day of the last menstrual period, the patient had a hysterectomy  for bleeding reasons.  Last Pap smear was October 25, 2008.  GENERAL:  The patient is a well-developed, well-nourished 55 year old  Caucasian female, in no acute distress.  HEENT:  Neck is supple.  The thyroid  is not enlarged.  There are no  nodules.  BREASTS:  Symmetrical.  There is some discoloration of the areola on the  right breast secondary to radiation.  There are well-healed scars from  her surgery.  There are no obvious mass.  No nipple discharge.  ABDOMEN:  Soft, nontender.  She does have a well-healed scar from  hysterectomy.  GENITALIA:  Externally, there is a bit of redness at the clitoral  region.  Internally at vaginal vault, there is a thick white, non  odorous discharge, moderate amount.  BIMANUAL:  Both ovaries are slightly tender.  EXTREMITIES:  Warm and dry.   ASSESSMENT:  1. Yearly exam.  We will not do a Pap smear.  She had a negative Pap      smear last year.  2. Presumptive yeast infection.  The patient is given a prescription      for Diflucan 150 mg 1  time dose okay to repeat once in 4 days with      2 refills.  3. We will check her labs today.  We will do a fasting lipid, CMET,      and TSH.  The patient will be scheduled for a DEXA scan to be done      at her convenience.  We will also have her scheduled for repeat      ultrasound of her ovaries at [facility] that should be able to      compare her old ultrasounds to this.  4. The patient will continue on her Wellbutrin for ongoing depression.      She does state that she is feeling slightly      better.  Her mood has lifted a bit.  She will return to see me as      previously scheduled for her migraines and depression.      [name], NP    ______________________________  Catalina Antigua, MD    LR/MEDQ  D:  10/24/2009  T:  10/25/2009  Job:  [id number]

## 2011-02-01 NOTE — Consult Note (Signed)
NAME:  [name], [name]                ACCOUNT NO.:  [id number]   MEDICAL RECORD NO.:  [id number]          PATIENT TYPE:  OUT   LOCATION:  GYN                          FACILITY:  [facility]   PHYSICIAN:  Paola A. Duard Brady, MD    DATE OF BIRTH:  05-12-1956   DATE OF CONSULTATION:  02/12/2005  DATE OF DISCHARGE:                                   CONSULTATION   REFERRING PHYSICIAN:  Dr. Edwena Felty. Romine.   The patient was seen today in consultation at the request of Dr. Tresa Res.   [name] is a 55 year old gravida 2, para 2, who has had bilateral lower  quadrant discomfort.  The pain is intermittent, and recently it started  increasing.  She has been seen by Dr. Tresa Res on a fairly regular basis and  has had transvaginal ultrasound, of which I have copies since June of 2005.  In June of 2005, she had a 4.3-x-2.6-x-2-cm echo-free, smooth cyst arising  from the superior aspect of the ovary.  It appears that that cyst has been  persistent since that time with the addition of a few other cysts.  Most  recently she had an ultrasound Jan 16, 2005 that showed a similar size, echo-  free, smoothly marginated cyst superior to the ovary.  She also has two  other smaller cysts on the right ovary measuring 3.8 x 3 x 2.4 cm and 1.2 x  1.  This one appears to be somewhat collapsed, measuring 10 x 9 mm.  She  does complain of some occasional pelvic pressure for the past month with  some increasing voiding with no urinary incontinence or leakage.  She  complains of occasional hot flashes, primarily at night.  Her mom was  menopausal at the age of 35.  She otherwise has no other complaints of  nausea, vomiting, fevers, chills, headaches or visual changes.  She denies  any other significant change in her bowel or bladder habits.   PAST MEDICAL HISTORY:  Migraines which are cyclical about twice a month.   PAST SURGICAL HISTORY:  Cesarean section via Pfannenstiel with repeat  operation for hemorrhage and  infection with a JP postoperatively, cesarean  section via vertical skin incision.   MEDICATIONS:  1. Effexor RX 75 mg daily.  2. Topamax 50 mg daily.     SOCIAL HISTORY:  She denies any tobacco or alcohol.  She runs a cleaning  business.  Her husband is a Financial planner.  They have two children, ages  41 and 51.  She has a 78-year-old granddaughter.   FAMILY HISTORY:  Significant for diabetes in grandmother and paternal aunt.  Her father had throat cancer.  He was a smoker.  She has a paternal aunt  with bladder cancer.  She was not a smoker.  She has three paternal uncles  who had prostate cancer.  She has a paternal first cousin who was diagnosed  and died of breast cancer at the age of 11.  She has a maternal uncle who  had lung cancer and was a smoker.  On her mother's side there  is also  Alzheimer's, diabetes and hypertension.   HEALTH MAINTENANCE:  She had a mammogram a week ago that was negative.   PHYSICAL EXAMINATION:  GENERAL APPEARANCE: 150 pounds.  Height 5 feet 4-1/2.  Well-nourished, well-developed female in no acute distress.  ABDOMEN:  Shows both a vertical and transverse skin incision.  The abdomen  is soft and nontender, nondistended.  There is no palpable mass or  hepatosplenomegaly.  Groins are negative for adenopathy.  PELVIC:  Bimanual examination reveals no masses or nodularity within the  pelvis.  The vaginal cuff is nontender.  Rectal confirms.   ASSESSMENT:  A 55 year old with ovarian cyst which could be related to the  ovary, or she could have peritoneal inclusion cyst.  She has a significant  history for adhesive disease per her report.   PLAN:  I discussed the options with her including close followup with repeat  ultrasound versus surgical intervention.  I do not think that this  represents a malignant process.  The cyst has not grown significantly.  As a  result it has been persistent for at least a year.  I spent approximately 30  minutes  face-to-face time with her and her husband discussing the options.  As it currently is, the patient is leaning towards surgery but would like to  have her repeat ultrasound which is scheduled for August of 2006 with Dr.  Harlene Salts office.  If the cyst is persistent and slightly increased in size,  as it has been on subsequent ultrasounds, she wishes to proceed with  surgery.  I discussed with her that I would be willing to attempt  laparoscopic surgery, but her need for exploratory laparotomy is fairly  great, based on the history.  She states that her records from her surgery  were sent to Dr. Harlene Salts office, and we will attempt to get those records.  Recommend bilateral salpingo-oophorectomy at the time based on her age and  difficult potential nature of surgery in the future.  As it is now, she will  follow up with Dr. Tresa Res in August for a repeat ultrasound, and she will  contact me should she wish to proceed with surgery at that time.      PAG/MEDQ  D:  02/12/2005  T:  02/12/2005  Job:  [id number]

## 2011-02-26 ENCOUNTER — Ambulatory Visit: Payer: PRIVATE HEALTH INSURANCE | Admitting: Nurse Practitioner

## 2011-03-14 ENCOUNTER — Ambulatory Visit
Admission: RE | Admit: 2011-03-14 | Discharge: 2011-03-14 | Disposition: A | Discharge status: Home / Self Care | Payer: PRIVATE HEALTH INSURANCE | Source: Ambulatory Visit | Attending: Nurse Practitioner | Admitting: Nurse Practitioner

## 2011-03-14 DIAGNOSIS — Z9889 Other specified postprocedural states: Secondary | ICD-10-CM

## 2011-07-01 LAB — POCT HEMOGLOBIN-HEMACUE
Hemoglobin: 13
Operator id: 123881

## 2011-07-24 ENCOUNTER — Telehealth: Payer: Self-pay

## 2011-07-24 MED ORDER — ZOLPIDEM TARTRATE 10 MG PO TABS: 10.0000 mg | ORAL_TABLET | Freq: Every evening | ORAL | Status: DC | PRN

## 2011-07-24 NOTE — Telephone Encounter (Signed)
Patient is calling about having her Ambien 10 mg refilled she last got it from [facility], and it's been a while, wanted to know if a nurse can call her about it. Her pharmacy is [facility] in [place]. Can she get it filled? Thanks!

## 2011-07-24 NOTE — Telephone Encounter (Signed)
[name] authorized 30 Ambien 10mg  with 3 refills. This was called into [facility] pharmacy in [place].

## 2011-07-30 NOTE — Telephone Encounter (Signed)
thanks

## 2011-12-31 ENCOUNTER — Encounter: Payer: Self-pay | Admitting: Nurse Practitioner

## 2011-12-31 ENCOUNTER — Ambulatory Visit (INDEPENDENT_AMBULATORY_CARE_PROVIDER_SITE_OTHER): Payer: PRIVATE HEALTH INSURANCE | Admitting: Nurse Practitioner

## 2011-12-31 VITALS — BP 164/95 | HR 80 | Ht 64.5 in | Wt 155.0 lb

## 2011-12-31 DIAGNOSIS — F419 Anxiety disorder, unspecified: Secondary | ICD-10-CM | POA: Insufficient documentation

## 2011-12-31 DIAGNOSIS — C50919 Malignant neoplasm of unspecified site of unspecified female breast: Secondary | ICD-10-CM

## 2011-12-31 DIAGNOSIS — G4700 Insomnia, unspecified: Secondary | ICD-10-CM

## 2011-12-31 DIAGNOSIS — F39 Unspecified mood [affective] disorder: Secondary | ICD-10-CM | POA: Insufficient documentation

## 2011-12-31 DIAGNOSIS — F329 Major depressive disorder, single episode, unspecified: Secondary | ICD-10-CM

## 2011-12-31 DIAGNOSIS — F411 Generalized anxiety disorder: Secondary | ICD-10-CM

## 2011-12-31 DIAGNOSIS — N390 Urinary tract infection, site not specified: Secondary | ICD-10-CM

## 2011-12-31 DIAGNOSIS — Z13220 Encounter for screening for lipoid disorders: Secondary | ICD-10-CM

## 2011-12-31 DIAGNOSIS — Z124 Encounter for screening for malignant neoplasm of cervix: Secondary | ICD-10-CM

## 2011-12-31 DIAGNOSIS — F32A Depression, unspecified: Secondary | ICD-10-CM

## 2011-12-31 DIAGNOSIS — Z01419 Encounter for gynecological examination (general) (routine) without abnormal findings: Secondary | ICD-10-CM

## 2011-12-31 DIAGNOSIS — Z853 Personal history of malignant neoplasm of breast: Secondary | ICD-10-CM | POA: Insufficient documentation

## 2011-12-31 DIAGNOSIS — G43909 Migraine, unspecified, not intractable, without status migrainosus: Secondary | ICD-10-CM | POA: Insufficient documentation

## 2011-12-31 LAB — LIPID PANEL
Cholesterol: 259 mg/dL — ABNORMAL HIGH (ref 0–200)
HDL: 56 mg/dL (ref >39)
Total CHOL/HDL Ratio: 4.6 Ratio
VLDL: 27 mg/dL (ref 0–40)

## 2011-12-31 MED ORDER — IBUPROFEN 800 MG PO TABS: 800.0000 mg | ORAL_TABLET | Freq: Three times a day (TID) | ORAL | Status: DC | PRN

## 2011-12-31 MED ORDER — BUPROPION HCL 75 MG PO TABS: 75.0000 mg | ORAL_TABLET | Freq: Once | ORAL | Status: DC

## 2011-12-31 MED ORDER — NITROFURANTOIN MONOHYD MACRO 100 MG PO CAPS: 100.0000 mg | ORAL_CAPSULE | Freq: Once | ORAL | Status: DC

## 2011-12-31 MED ORDER — RIZATRIPTAN BENZOATE 10 MG PO TABS: 10.0000 mg | ORAL_TABLET | Freq: Once | ORAL | Status: DC | PRN

## 2011-12-31 NOTE — Progress Notes (Signed)
Yearly exam/tn

## 2011-12-31 NOTE — Patient Instructions (Signed)
Pap Test A Pap test checks the cells in the cervix for any infections or changes that may turn into cancer. Have a Pap test every year if:  You are having sex (intercourse).   You are high risk for cervical cancer. You are high risk if:   You started having sex at a very young age.   You have sex with a lot of different partners.   You smoke.   You have human papillomavirus (HPV).   You have HIV.  BEFORE THE PROCEDURE  Schedule the Pap test before or after your period.   Do not have sex for 2 days before your Pap test.   For 3 days before your Pap test, do not douche or put any creams or medicines in your vagina.  PROCEDURE  Try to relax. You will be more comfortable. Try breathing slowly and deeply. Do not tighten up your muscles.   A tool (speculum) is put into your vagina and opens it up.   Your doctor will use a special swab or brush to take cells from your cervix.   You may feel pressure or pulling as the cells are taken off your cervix.   The cells will be checked under a microscope.  AFTER THE PROCEDURE You may have a few spots of blood from your vagina for 2 to 3 days. If you have a lot of bleeding, or it does not stop in a few days, tell your doctor. Finding out the results of your test Ask when your test results will be ready. Make sure you get your test results. PAP TEST RECOMMENDATIONS  Your first Pap test should be done at age 21.   Pap tests are repeated every 2 years between ages 21 and 29.   Beginning at age 30, you should have a Pap test every 3 years as long as your past 3 PAP tests have been normal.   If you are at risk of getting cervical cancer. Talk to your caregiver if a new problem shows up soon after your last Pap test.   If you have had past treatment for cervical cancer or a condition that could lead to cancer, you need Pap tests and screening for cancer for at least 20 years after your treatment.  WHO DOES NOT NEED TO HAVE A PAP TEST? You  do not need a Pap test if:  If you had a hysterectomy for a problem that was not cancer or a condition that could lead to cancer. A regular exam is still recommended.    If you are between ages 65 and 70, and you have had normal Pap tests for 10 years. A regular exam is still recommended.    If Pap tests have been discontinued, risk factors (such as a new sexual partner)need to be looked at again to see if screening should be started again.  Document Released: 10/05/2010 Document Revised: 08/22/2011 Document Reviewed: 10/05/2010 Exit[employer] Patient Information 2012 [employer]. 

## 2011-12-31 NOTE — Progress Notes (Signed)
Subjective:     Patient ID: [name], female   DOB: [birth year], 56 y.o.   MRN: [id number]  HPI Pt is in office today for her yearly pap and breast exam. Pt has hx of breast cancer followed by Dr Park Breed in [place]. She has stopped taking Tamoxifen after 4 + years. She was having difficulty with side effects. Since stopping she has noted weight loss and more energy. Today her BP is elevated, it was repeated at end of exam and was 157/ 90. She keeps her BP at home and it ranges 135-140/ 70-80. She is very anxious and in fact we discussed decreasing dose of Wellbutrin today. She is feeling better now that her husbands business has sold and they are back home.Her depression is more in winter months. She is requesting refills on macrobid for chronic UTIs  and medications for migraine Motrin/ Maxalt.    Review of Systems  Constitutional: Negative for activity change, appetite change, fatigue and unexpected weight change.  HENT: Positive for sneezing, neck pain and postnasal drip. Negative for ear pain.   Eyes: Negative.   Respiratory: Negative.   Cardiovascular: Negative.   Gastrointestinal: Negative.        C/O some times food sticking in her throat  Genitourinary: Negative.  Negative for vaginal bleeding, vaginal discharge and vaginal pain.  Skin: Negative.   Neurological: Positive for headaches. Negative for weakness.  Psychiatric/Behavioral: Positive for sleep disturbance. The patient is nervous/anxious.        Objective:   Physical Exam  Constitutional: She is oriented to person, place, and time. She appears well-developed and well-nourished. No distress.  HENT:  Head: Normocephalic and atraumatic.  Eyes: Pupils are equal, round, and reactive to light.  Neck: Normal range of motion. No thyromegaly present.  Cardiovascular: Normal rate, regular rhythm and normal heart sounds.  Exam reveals no gallop and no friction rub.   No murmur heard. Pulmonary/Chest: Effort normal and breath  sounds normal. No respiratory distress. She has no wheezes. She has no rales. She exhibits no tenderness.  Abdominal: Soft. Bowel sounds are normal. She exhibits no distension and no mass. There is no tenderness. There is no rebound and no guarding.  Genitourinary: Vagina normal and uterus normal.       Rectal exam negative  Musculoskeletal: Normal range of motion.  Neurological: She is alert and oriented to person, place, and time.  Skin: Skin is warm and dry.  Psychiatric: She has a normal mood and affect. Her behavior is normal. Judgment and thought content normal.       She is anxious always       Assessment:     Yearly Pap Exam  Migraine without Aura Anxiety Insomnia Chronic UTI's Elevated BP Depression Hx Breast Cancer    Plan:     Her pap and physical exam were done today. She is asked again to get Medical Doctor that will follow her from medical standpoint. She will be referred to [facility] at [facility]. Will check lipid panel, CBC with diff today. Refill medications macrobid, maxalt and motrin. Will decrease dose of Wellbutrin to 75mg  and if that does not help her anxiety will consider adding SSRI.

## 2012-01-01 LAB — CBC WITH DIFFERENTIAL/PLATELET
Basophils Absolute: 0 10*3/uL (ref 0.0–0.1)
Basophils Relative: 0 % (ref 0–1)
Eosinophils Absolute: 0.1 10*3/uL (ref 0.0–0.7)
Eosinophils Relative: 3 % (ref 0–5)
Lymphs Abs: 1.4 10*3/uL (ref 0.7–4.0)
MCH: 28.5 pg (ref 26.0–34.0)
MCV: 88.1 fL (ref 78.0–100.0)
Neutrophils Relative %: 58 % (ref 43–77)
Platelets: 236 10*3/uL (ref 150–400)
RBC: 4.52 MIL/uL (ref 3.87–5.11)
RDW: 13.5 % (ref 11.5–15.5)
WBC: 4.3 10*3/uL (ref 4.0–10.5)

## 2012-01-14 ENCOUNTER — Telehealth: Payer: Self-pay | Admitting: Nurse Practitioner

## 2012-01-14 MED ORDER — TRAMADOL HCL 50 MG PO TABS: 50.0000 mg | ORAL_TABLET | Freq: Four times a day (QID) | ORAL | Status: DC | PRN

## 2012-01-14 NOTE — Telephone Encounter (Signed)
Pt was informed of elevated cholesterol and need to see MD at [facility] at [facility]. She is aware and will make that appointment. Also requested ultram prescription for rescue from migraine.

## 2012-01-20 ENCOUNTER — Other Ambulatory Visit: Payer: Self-pay | Admitting: Obstetrics & Gynecology

## 2012-01-20 DIAGNOSIS — Z853 Personal history of malignant neoplasm of breast: Secondary | ICD-10-CM

## 2012-01-28 ENCOUNTER — Other Ambulatory Visit: Payer: Self-pay | Admitting: Nurse Practitioner

## 2012-01-28 DIAGNOSIS — F419 Anxiety disorder, unspecified: Secondary | ICD-10-CM

## 2012-01-28 MED ORDER — SERTRALINE HCL 50 MG PO TABS: 50.0000 mg | ORAL_TABLET | Freq: Every day | ORAL | Status: DC

## 2012-02-14 ENCOUNTER — Telehealth: Payer: Self-pay | Admitting: *Deleted

## 2012-02-14 DIAGNOSIS — F32A Depression, unspecified: Secondary | ICD-10-CM

## 2012-02-14 DIAGNOSIS — F329 Major depressive disorder, single episode, unspecified: Secondary | ICD-10-CM

## 2012-02-14 MED ORDER — BUPROPION HCL ER (SR) 150 MG PO TB12: 150.0000 mg | ORAL_TABLET | Freq: Two times a day (BID) | ORAL | Status: DC

## 2012-02-14 NOTE — Telephone Encounter (Signed)
Patients pharmacy sent refill request for her wellbutrin.

## 2012-03-09 ENCOUNTER — Encounter: Payer: Self-pay | Admitting: Family Medicine

## 2012-03-09 ENCOUNTER — Ambulatory Visit (INDEPENDENT_AMBULATORY_CARE_PROVIDER_SITE_OTHER): Payer: PRIVATE HEALTH INSURANCE | Admitting: Family Medicine

## 2012-03-09 VITALS — BP 140/90 | HR 76 | Temp 98.4°F | Ht 65.0 in | Wt 156.0 lb

## 2012-03-09 DIAGNOSIS — F329 Major depressive disorder, single episode, unspecified: Secondary | ICD-10-CM

## 2012-03-09 DIAGNOSIS — R030 Elevated blood-pressure reading, without diagnosis of hypertension: Secondary | ICD-10-CM

## 2012-03-09 DIAGNOSIS — IMO0001 Reserved for inherently not codable concepts without codable children: Secondary | ICD-10-CM | POA: Insufficient documentation

## 2012-03-09 DIAGNOSIS — F32A Depression, unspecified: Secondary | ICD-10-CM

## 2012-03-09 DIAGNOSIS — F419 Anxiety disorder, unspecified: Secondary | ICD-10-CM

## 2012-03-09 DIAGNOSIS — E785 Hyperlipidemia, unspecified: Secondary | ICD-10-CM | POA: Insufficient documentation

## 2012-03-09 DIAGNOSIS — F411 Generalized anxiety disorder: Secondary | ICD-10-CM

## 2012-03-09 DIAGNOSIS — C50919 Malignant neoplasm of unspecified site of unspecified female breast: Secondary | ICD-10-CM

## 2012-03-09 MED ORDER — CITALOPRAM HYDROBROMIDE 20 MG PO TABS: 20.0000 mg | ORAL_TABLET | Freq: Every day | ORAL | Status: DC

## 2012-03-09 MED ORDER — SIMVASTATIN 10 MG PO TABS: 10.0000 mg | ORAL_TABLET | Freq: Every day | ORAL | Status: DC

## 2012-03-09 NOTE — Progress Notes (Signed)
Subjective:    Patient ID: [name], female    DOB: 1955-12-01, 56 y.o.   MRN: [id number]  HPI  56 yo here to establish care.  HLD-  Has been trying to work on diet but lipids have continued to remain high. Strong FH of HLD.  Has never been on cholesterol medication. Lab Results  Component Value Date   CHOL 259* 12/31/2011   HDL 56 12/31/2011   LDLCALC 176* 12/31/2011   TRIG 133 12/31/2011   CHOLHDL 4.6 12/31/2011   H/o DCIS in 2008- s/p tamoxifen. 5 year mammogram is next week!  Anxiety- had been doing quite well on Wellbutrin (low dose) but lately feels more anxious. Difficulty falling asleep.  Husband is "irritating" her more than he usually does- feels they have an excellent marriage. Has not lost interest in doing things she likes to do, just feel constantly worried. In the past few years has endured several personal tragedies- ex husband was killed, father died, lost her job.  Frequently tearful.  No SI or HI. Says she has tried multiple SSRIs in past, felt they did not work.  White coat HTN- BP typically elevated at doctor's office. Brings in BP log from home- ranging 117/76-145/82. No CP, Blurred vision, HA or SOB.  Patient Active Problem List  Diagnosis  . Migraine  . Anxiety  . Depression  . Insomnia  . Breast cancer  . HLD (hyperlipidemia)   Past Medical History  Diagnosis Date  . Cancer 02/27/2007    Breast Cancer  . Migraine    Past Surgical History  Procedure Date  . Cesarean section     two times  . Abdominal hysterectomy    History  Substance Use Topics  . Smoking status: Never Smoker   . Smokeless tobacco: Never Used  . Alcohol Use: No   Family History  Problem Relation Age of Onset  . Thyroid disease Mother   . Hypertension Mother   . Hyperlipidemia Mother   . Alzheimer's disease Mother   . Cancer Father     throat cancer  . Hyperlipidemia Father   . Diabetes Sister   . Alzheimer's disease Maternal Aunt   . Alzheimer's disease  Maternal Uncle   . Diabetes Paternal Uncle   . Alzheimer's disease Maternal Grandmother   . Diabetes Paternal Grandmother    No Known Allergies Current Outpatient Prescriptions on File Prior to Visit  Medication Sig Dispense Refill  . buPROPion (WELLBUTRIN SR) 150 MG 12 hr tablet Take 1 tablet (150 mg total) by mouth 2 (two) times daily.  30 tablet  11  . fish oil-omega-3 fatty acids 1000 MG capsule Take 2 g by mouth daily.      Chilton Si Tea 150 MG CAPS Take by mouth.      . traMADol (ULTRAM) 50 MG tablet Take 1 tablet (50 mg total) by mouth every 6 (six) hours as needed for pain.  30 tablet  1  . citalopram (CELEXA) 20 MG tablet Take 1 tablet (20 mg total) by mouth daily.  30 tablet  2  . rizatriptan (MAXALT) 10 MG tablet Take 1 tablet (10 mg total) by mouth once as needed for migraine. May repeat in 2 hours if needed  12 tablet  11  . simvastatin (ZOCOR) 10 MG tablet Take 1 tablet (10 mg total) by mouth at bedtime.  90 tablet  3   The PMH, PSH, Social History, Family History, Medications, and allergies have been reviewed in [facility], and have  been updated if relevant.   Review of Systems See HPI Patient reports no  vision/ hearing changes,anorexia, weight change, fever ,adenopathy, persistant / recurrent hoarseness, swallowing issues, chest pain, edema,persistant / recurrent cough, hemoptysis, dyspnea(rest, exertional, paroxysmal nocturnal), gastrointestinal  bleeding (melena, rectal bleeding), abdominal pain, excessive heart burn, GU symptoms(dysuria, hematuria, pyuria, voiding/incontinence  Issues) syncope, focal weakness, severe memory loss    Objective:   Physical Exam BP 140/90  Pulse 76  Temp 98.4 F (36.9 C)  Ht 5\' 5"  (1.651 m)  Wt 156 lb (70.761 kg)  BMI 25.96 kg/m2  General:  Well-developed,well-nourished,in no acute distress; alert,appropriate and cooperative throughout examination Head:  normocephalic and atraumatic.   Eyes:  vision grossly intact, pupils equal, pupils  round, and pupils reactive to light.   Ears:  R ear normal and L ear normal.   Nose:  no external deformity.   Lungs:  Normal respiratory effort, chest expands symmetrically. Lungs are clear to auscultation, no crackles or wheezes. Heart:  Normal rate and regular rhythm. S1 and S2 normal without gallop, murmur, click, rub or other extra sounds. Msk:  No deformity or scoliosis noted of thoracic or lumbar spine.   Extremities:  No clubbing, cyanosis, edema, or deformity noted with normal full range of motion of all joints.   Neurologic:  alert & oriented X3 and gait normal.   Skin:  Intact without suspicious lesions or rashes Cervical Nodes:  No lymphadenopathy noted Psych:  Cognition and judgment appear intact. Alert and cooperative with normal attention span and concentration. No apparent delusions, illusions, hallucinations     Assessment & Plan:   1. HLD (hyperlipidemia)  New- discussed low cholesterol diet. Given persistently elevated LDL, will start low dose simvastatin 10 mg qhs- follow up labs in 3 months. The patient indicates understanding of these issues and agrees with the plan.   2. Breast cancer  S/p tamoxifen. Doing well-5 years cancer free!    3. Depression  Deteriorated. Discussed tx options.  Will d/c wellbutrin. Start Celexa 20 mg daily. Follow up win 3-4 weeks.   4. Anxiety  Deteriorated. See above.   5. Elevated BP  With normal BP at home. No tx at this time, continue to monitor.

## 2012-03-09 NOTE — Patient Instructions (Addendum)
Let's stop taking the Wellbutrin. Please come in for a lab visit in 3 months- please bring your colonoscopy report when you come. Please call me in a few weeks to let me know if the celexa is working.

## 2012-03-16 ENCOUNTER — Ambulatory Visit
Admission: RE | Admit: 2012-03-16 | Discharge: 2012-03-16 | Disposition: A | Discharge status: Home / Self Care | Payer: PRIVATE HEALTH INSURANCE | Source: Ambulatory Visit | Attending: Obstetrics & Gynecology | Admitting: Obstetrics & Gynecology

## 2012-03-16 DIAGNOSIS — Z853 Personal history of malignant neoplasm of breast: Secondary | ICD-10-CM

## 2012-03-25 ENCOUNTER — Telehealth: Payer: Self-pay | Admitting: Family Medicine

## 2012-03-25 ENCOUNTER — Telehealth: Payer: Self-pay

## 2012-03-25 NOTE — Telephone Encounter (Signed)
Caller: Datra/Patient; PCP: Ruthe Mannan (Nestor Ramp); CB#: [phone]; ; ; Call regarding Abdominal Pain; Onset several days, after starting Citaloprm and Zocor on 03/10/12.  02/2912 abd pain noted after taking Celexa and then eating, unable to eat very much without abd hurting.. Takes Celexa in am and few hours later will have abd pain, "like a nervous stomach", has bloating also. Pt states she is fine every morning on waking but once she takes Rx and eats  will have abd pain. Pt has researched side effects of both Citalapram and Zocor and feels these are causing sxs.  Afebrile. All emergent s/s r/o per Abdominal Pain with the exception of "Abdominal discomfort (feeling of fullness/bloating, nausea) and has started new prescription, nonprescription or alternative medication or change in dose or frequency of usual medication". RN instructed needs an evaluation in 24 hours. Care advice given. Appt scheduled with Dr Dayton Martes at 0945 on 03/26/12.

## 2012-03-25 NOTE — Telephone Encounter (Signed)
Pt left v/m started new med on 03/09/12 and stomach hurting. Called pt back and pt said was on the other phone with CAN. I advised pt if CAN resolves pts need that is fine but can call office back if needed. Pt appreciated.

## 2012-03-26 ENCOUNTER — Telehealth: Payer: Self-pay | Admitting: *Deleted

## 2012-03-26 ENCOUNTER — Ambulatory Visit: Payer: PRIVATE HEALTH INSURANCE | Admitting: Family Medicine

## 2012-03-26 NOTE — Telephone Encounter (Signed)
Pt called call a nurse line yesterday regarding abd pain and was scheduled an appt.  She states she really cant afford to come in so she cancelled.  She has been taking celexa for about 2 weeks and has abd pain and bloating after eating.  She thinks this is caused by the celexa.  She has been taking this in the morning and asks if ok if she takes it at night instead.  She was wondering if ok to take that and zocor together.  Advised yes.  Suggested she try this for a couple of weeks and call back if still having problems.

## 2012-03-26 NOTE — Telephone Encounter (Signed)
Agreed -

## 2012-05-26 ENCOUNTER — Other Ambulatory Visit: Payer: Self-pay | Admitting: Family Medicine

## 2012-06-09 ENCOUNTER — Ambulatory Visit: Payer: PRIVATE HEALTH INSURANCE | Admitting: Family Medicine

## 2012-06-25 ENCOUNTER — Other Ambulatory Visit: Payer: Self-pay | Admitting: Family Medicine

## 2012-06-30 ENCOUNTER — Telehealth: Payer: Self-pay

## 2012-06-30 MED ORDER — CITALOPRAM HYDROBROMIDE 40 MG PO TABS: 40.0000 mg | ORAL_TABLET | Freq: Every day | ORAL | Status: DC

## 2012-06-30 NOTE — Telephone Encounter (Signed)
Pt called back pt taking citalopram 20 mg.Please advise.

## 2012-06-30 NOTE — Telephone Encounter (Signed)
Yes, I reviewed her last OV note.  Rx for higher dose of celexa sent to her pharmacy. Please make sure she is aware of a possible interaction between higher dose celexa and her migraine medication.  As long as she is not taking her migraine medication (maxalt) frequently, this should not be an issue but it remains a possibility.

## 2012-06-30 NOTE — Telephone Encounter (Signed)
Pt knows she needs to come back for f/u but no insurance until Jan. Med for anxiety (pt not sure of name of med) has helped but pt still has some anxiety and nervousness due to issues with mother.Pt wants to know if can increase med without being seen. Pt will call back name of medication. [facility].

## 2012-07-01 NOTE — Telephone Encounter (Signed)
Advised patient.  She says she never took the maxalt but does take zomig, but says she only takes 2.5 mg when she has a migraine- which she says she rarely has anymore.  Patient is asking what type of reaction could this cause.

## 2012-07-01 NOTE — Telephone Encounter (Signed)
Advised patient as instructed. 

## 2012-07-01 NOTE — Telephone Encounter (Signed)
It theoretically can cause serotonin sydrome which is very rare and unlikely if she is not taking it frequently.

## 2012-08-10 ENCOUNTER — Telehealth: Payer: Self-pay

## 2012-08-10 NOTE — Telephone Encounter (Signed)
Patient called after taking antibiotics she has develop a yeast infection and would like [place] to call something in for her. Per nurse I called in some Dyflucan 150 mg to her [facility] pharmacy

## 2012-08-17 ENCOUNTER — Other Ambulatory Visit: Payer: Self-pay | Admitting: Nurse Practitioner

## 2012-09-21 ENCOUNTER — Ambulatory Visit: Payer: PRIVATE HEALTH INSURANCE | Admitting: Family Medicine

## 2012-12-29 ENCOUNTER — Other Ambulatory Visit: Payer: Self-pay | Admitting: Nurse Practitioner

## 2013-01-14 ENCOUNTER — Other Ambulatory Visit: Payer: Self-pay | Admitting: Plastic Surgery

## 2013-01-14 HISTORY — PX: BREAST SURGERY: SHX581

## 2013-03-24 ENCOUNTER — Other Ambulatory Visit: Payer: Self-pay | Admitting: Nurse Practitioner

## 2013-03-30 ENCOUNTER — Ambulatory Visit (INDEPENDENT_AMBULATORY_CARE_PROVIDER_SITE_OTHER): Payer: BC Managed Care – PPO | Admitting: Family Medicine

## 2013-03-30 ENCOUNTER — Encounter: Payer: Self-pay | Admitting: Family Medicine

## 2013-03-30 VITALS — BP 150/90 | HR 76 | Temp 98.9°F | Wt 170.5 lb

## 2013-03-30 DIAGNOSIS — K582 Mixed irritable bowel syndrome: Secondary | ICD-10-CM | POA: Insufficient documentation

## 2013-03-30 DIAGNOSIS — K589 Irritable bowel syndrome without diarrhea: Secondary | ICD-10-CM

## 2013-03-30 DIAGNOSIS — J069 Acute upper respiratory infection, unspecified: Secondary | ICD-10-CM

## 2013-03-30 DIAGNOSIS — J019 Acute sinusitis, unspecified: Secondary | ICD-10-CM | POA: Insufficient documentation

## 2013-03-30 MED ORDER — FLUCONAZOLE 150 MG PO TABS: 150.0000 mg | ORAL_TABLET | Freq: Once | ORAL | Status: DC

## 2013-03-30 MED ORDER — AMOXICILLIN 875 MG PO TABS: 875.0000 mg | ORAL_TABLET | Freq: Two times a day (BID) | ORAL | Status: DC

## 2013-03-30 MED ORDER — LINACLOTIDE 145 MCG PO CAPS: 145.0000 ug | ORAL_CAPSULE | Freq: Every day | ORAL | Status: DC

## 2013-03-30 NOTE — Progress Notes (Signed)
SUBJECTIVE:  [name] is a 57 y.o. female who complains of headache and bilateral sinus pain, facial swelling for 9 days. She denies a history of anorexia, chest pain, chills, dizziness and fatigue and denies a history of asthma. Patient denies smoke cigarettes.   Also would like to discuss chronic constipation.  On and off for years.  Also gets bloated, especially at end of day and with gas producing foods.  Normal colonoscopy (Dr. Juanda Chance) in 2009.  No blood or mucous in stool.  Has tried stool softeners, laxatives, probiotics without improvement. Patient Active Problem List   Diagnosis Date Noted  . Acute upper respiratory infections of unspecified site 03/30/2013  . HLD (hyperlipidemia) 03/09/2012  . Elevated BP 03/09/2012  . Migraine 12/31/2011  . Anxiety 12/31/2011  . Depression 12/31/2011  . Insomnia 12/31/2011  . Breast cancer 12/31/2011   Past Medical History  Diagnosis Date  . Cancer 02/27/2007    Breast Cancer  . Migraine    Past Surgical History  Procedure Laterality Date  . Cesarean section      two times  . Abdominal hysterectomy     History  Substance Use Topics  . Smoking status: Never Smoker   . Smokeless tobacco: Never Used  . Alcohol Use: No   Family History  Problem Relation Age of Onset  . Thyroid disease Mother   . Hypertension Mother   . Hyperlipidemia Mother   . Alzheimer's disease Mother   . Cancer Father     throat cancer  . Hyperlipidemia Father   . Diabetes Sister   . Alzheimer's disease Maternal Aunt   . Alzheimer's disease Maternal Uncle   . Diabetes Paternal Uncle   . Alzheimer's disease Maternal Grandmother   . Diabetes Paternal Grandmother    No Known Allergies Current Outpatient Prescriptions on File Prior to Visit  Medication Sig Dispense Refill  . citalopram (CELEXA) 40 MG tablet Take 1 tablet (40 mg total) by mouth daily.  30 tablet  6  . Green Tea 150 MG CAPS Take by mouth.      [name] ibuprofen (ADVIL,MOTRIN) 800 MG  tablet Take one by mouth as needed      . traMADol (ULTRAM) 50 MG tablet TAKE ONE TABLET BY MOUTH EVERY 6 HOURS AS NEEDED FOR PAIN  30 tablet  0  . fish oil-omega-3 fatty acids 1000 MG capsule Take 2 g by mouth daily.      . simvastatin (ZOCOR) 10 MG tablet Take 1 tablet (10 mg total) by mouth at bedtime.  90 tablet  3   No current facility-administered medications on file prior to visit.    OBJECTIVE: BP 150/90  Pulse 76  Temp(Src) 98.9 F (37.2 C) (Oral)  Wt 170 lb 8 oz (77.338 kg)  BMI 28.37 kg/m2  SpO2 97%  She appears well, vital signs are as noted. Face appears a little puffy.  Ears normal.  Throat and pharynx normal.  Neck supple. No adenopathy in the neck. Nose is congested. Sinuses tender to palpation. The chest is clear, without wheezes or rales.  ASSESSMENT/PLAN:  1.  Sinusitis-  Given duration and progression of symptoms, will treat for bacterial sinusitis.  Symptomatic therapy suggested: push fluids, rest and return office visit prn if symptoms persist or worsen.  Call or return to clinic prn if these symptoms worsen or fail to improve as anticipated.  2.  IBS with constipation- Will add Linzess.  See AVS for further recommendations.

## 2013-03-30 NOTE — Patient Instructions (Addendum)
Take antibiotic as directed.  Drink lots of fluids.   Treat sympotmatically with Mucinex, nasal saline irrigation, and Tylenol/Ibuprofen.   Also try an antihistamine/decongestant like claritin D or zyrtec D (both have sudafed in them so please don't take with sudafed) over the counter- two times a day as needed ( have to sign for them at pharmacy).   Try over the counter nasocort-start with 2 sprays per nostril per day...and then try to taper to 1 spray per nostril once symptoms improve.   You can use warm compresses.  Cough suppressant at night.   Call if not improving as expected in 5-7 days.   For IBS, we are starting Linzess.  Call me in 4 weeks with an update. Increase fiber and water, and try over the counter gas-x (four times a day when necessary) or beano for bloating.   pepcid 20 mg daily for next 2 weeks. Exclude gas producing foods (beans, onions, celery, carrots, raisins, bananas, apricots, prunes, brussel sprouts, wheat germ, pretzels)   Trial of align for bloating/IBS symptoms (OTC).

## 2013-04-19 ENCOUNTER — Other Ambulatory Visit: Payer: Self-pay | Admitting: Nurse Practitioner

## 2013-04-19 DIAGNOSIS — G8929 Other chronic pain: Secondary | ICD-10-CM

## 2013-04-19 NOTE — Telephone Encounter (Signed)
Patient is requesting refill of her ibuprofen, she has set up an appointment for her physical for a couple of weeks out.

## 2013-04-26 ENCOUNTER — Encounter: Payer: Self-pay | Admitting: Family Medicine

## 2013-04-26 ENCOUNTER — Ambulatory Visit (INDEPENDENT_AMBULATORY_CARE_PROVIDER_SITE_OTHER): Payer: BC Managed Care – PPO | Admitting: Family Medicine

## 2013-04-26 ENCOUNTER — Telehealth: Payer: Self-pay | Admitting: Family Medicine

## 2013-04-26 VITALS — BP 144/90 | HR 84 | Temp 98.9°F | Wt 169.8 lb

## 2013-04-26 DIAGNOSIS — J069 Acute upper respiratory infection, unspecified: Secondary | ICD-10-CM

## 2013-04-26 MED ORDER — AMOXICILLIN-POT CLAVULANATE 875-125 MG PO TABS: 1.0000 | ORAL_TABLET | Freq: Two times a day (BID) | ORAL | Status: AC

## 2013-04-26 NOTE — Telephone Encounter (Signed)
Patient Information:  Caller Name: Alexanderia  Phone: [phone]  Patient: [name], [name]  Gender: Female  DOB: 10/31/1955  Age: 57 Years  PCP: Ruthe Mannan [facility])  Office Follow Up:  Does the office need to follow up with this patient?: No  Instructions For The Office: N/A  RN Note:  Pt having same symptoms as treated for on 7-15:  Sinus pressure, Ears popping, Teeth sore and post nasal drainage.  Appt scheduled.  Dr Dayton Martes not available on 8-11.   Symptoms  Reason For Call & Symptoms: Sinus Pressure, Ears popping and Teeth sore, post nasal drainage  Reviewed Health History In EMR: Yes  Reviewed Medications In EMR: Yes  Reviewed Allergies In EMR: Yes  Reviewed Surgeries / Procedures: Yes  Date of Onset of Symptoms: 04/22/2013  Treatments Tried: Sudafed  Treatments Tried Worked: No  Guideline(s) Used:  Sinus Pain and Congestion  Disposition Per Guideline:   Go to Office Now  Reason For Disposition Reached:   Severe sinus pain  Advice Given:  N/A  Patient Will Follow Care Advice:  YES  Appointment Scheduled:  04/26/2013 14:15:00 Appointment Scheduled Provider:  Eustaquio Boyden (Family Practice)

## 2013-04-26 NOTE — Progress Notes (Signed)
  Subjective:    Patient ID: [name], female    DOB: 11-20-1955, 57 y.o.   MRN: [id number]  HPI CC: sinusitis?  Seen last month by PCP with dx sinusitis, treated with 10d course of amoxicillin.  Felt better after this, then in the last 5 days noticing sxs returning.  Frontal sinus pressure headache, sinus congestion, fullness in ears.  + tooth pain and PNdrainage. sxs worsening over last 1-2 days.  No fevers/chills, abd pain, diarrhea, ear pain, ST, cough.  Taking pseudophed.  No sick contacts at home.  No smokers at home. No h/o asthma/COPD.  Past Medical History  Diagnosis Date  . Cancer 02/27/2007    Breast Cancer  . Migraine      Review of Systems Per HPI    Objective:   Physical Exam  Nursing note and vitals reviewed. Constitutional: She appears well-developed and well-nourished. No distress.  HENT:  Head: Normocephalic and atraumatic.  Right Ear: Hearing, tympanic membrane, external ear and ear canal normal.  Left Ear: Hearing, tympanic membrane, external ear and ear canal normal.  Nose: No mucosal edema or rhinorrhea. Right sinus exhibits maxillary sinus tenderness (mild). Right sinus exhibits no frontal sinus tenderness. Left sinus exhibits maxillary sinus tenderness (mild). Left sinus exhibits no frontal sinus tenderness.  Mouth/Throat: Uvula is midline, oropharynx is clear and moist and mucous membranes are normal. No oropharyngeal exudate, posterior oropharyngeal edema, posterior oropharyngeal erythema or tonsillar abscesses.  Eyes: Conjunctivae and EOM are normal. Pupils are equal, round, and reactive to light. No scleral icterus.  Neck: Normal range of motion. Neck supple.  Cardiovascular: Normal rate, regular rhythm, normal heart sounds and intact distal pulses.   No murmur heard. Pulmonary/Chest: Effort normal and breath sounds normal. No respiratory distress. She has no wheezes. She has no rales.  Lymphadenopathy:    She has no cervical adenopathy.   Skin: Skin is warm and dry. No rash noted.       Assessment & Plan:

## 2013-04-26 NOTE — Assessment & Plan Note (Signed)
In setting of recent sinusitis treated with 10d amox. However fully improved after treatment pointing to today's illness as likely rpt acute sinusitis, possibly viral. Supportive care as per instructions. Provided with [facility] script for augmentin, discussed reasons to fill. Pt agrees with plan.

## 2013-04-26 NOTE — Patient Instructions (Addendum)
You have a sinus infection. Take medicine as prescribed: augmentin if any worsening or not improving as expected. Push fluids and plenty of rest. Nasal saline irrigation or neti pot to help drain sinuses. Use ibuprofen for sinus inflammation May use plain mucinex or immediate release guaifenesin with plenty of fluid to help mobilize mucous. Let us know if fever >101.5, trouble opening/closing mouth, difficulty swallowing, or worsening - you may need to be seen again.

## 2013-04-28 NOTE — Telephone Encounter (Signed)
Pt seen

## 2013-05-04 ENCOUNTER — Ambulatory Visit: Payer: BC Managed Care – PPO | Admitting: Nurse Practitioner

## 2013-05-11 ENCOUNTER — Encounter: Payer: Self-pay | Admitting: Nurse Practitioner

## 2013-05-11 ENCOUNTER — Ambulatory Visit (INDEPENDENT_AMBULATORY_CARE_PROVIDER_SITE_OTHER): Payer: BC Managed Care – PPO | Admitting: Nurse Practitioner

## 2013-05-11 VITALS — BP 136/89 | HR 76 | Ht 64.0 in | Wt 170.0 lb

## 2013-05-11 DIAGNOSIS — Z1151 Encounter for screening for human papillomavirus (HPV): Secondary | ICD-10-CM

## 2013-05-11 DIAGNOSIS — F411 Generalized anxiety disorder: Secondary | ICD-10-CM

## 2013-05-11 DIAGNOSIS — G8929 Other chronic pain: Secondary | ICD-10-CM

## 2013-05-11 DIAGNOSIS — C50919 Malignant neoplasm of unspecified site of unspecified female breast: Secondary | ICD-10-CM

## 2013-05-11 DIAGNOSIS — Z01419 Encounter for gynecological examination (general) (routine) without abnormal findings: Secondary | ICD-10-CM

## 2013-05-11 DIAGNOSIS — N390 Urinary tract infection, site not specified: Secondary | ICD-10-CM

## 2013-05-11 DIAGNOSIS — F419 Anxiety disorder, unspecified: Secondary | ICD-10-CM

## 2013-05-11 DIAGNOSIS — R51 Headache: Secondary | ICD-10-CM

## 2013-05-11 DIAGNOSIS — C50911 Malignant neoplasm of unspecified site of right female breast: Secondary | ICD-10-CM

## 2013-05-11 DIAGNOSIS — Z124 Encounter for screening for malignant neoplasm of cervix: Secondary | ICD-10-CM

## 2013-05-11 MED ORDER — ZOLMITRIPTAN 5 MG PO TABS: 5.0000 mg | ORAL_TABLET | ORAL | Status: DC | PRN

## 2013-05-11 MED ORDER — TRAMADOL HCL 50 MG PO TABS: 50.0000 mg | ORAL_TABLET | Freq: Four times a day (QID) | ORAL | Status: DC | PRN

## 2013-05-11 MED ORDER — ZOLMITRIPTAN 5 MG NA SOLN: 1.0000 | NASAL | Status: DC | PRN

## 2013-05-11 MED ORDER — NITROFURANTOIN MONOHYD MACRO 100 MG PO CAPS: 100.0000 mg | ORAL_CAPSULE | Freq: Once | ORAL | Status: DC

## 2013-05-11 MED ORDER — IBUPROFEN 800 MG PO TABS: 800.0000 mg | ORAL_TABLET | Freq: Three times a day (TID) | ORAL | Status: DC | PRN

## 2013-05-11 NOTE — Patient Instructions (Signed)
Menopause Menopause is the normal time of life when menstrual periods stop completely. Menopause is complete when you have missed 12 consecutive menstrual periods. It usually occurs between the ages of 48 to 55, with an average age of 51. Very rarely does a woman develop menopause before 57 years old. At menopause, your ovaries stop producing the female hormones, estrogen and progesterone. This can cause undesirable symptoms and also affect your health. Sometimes the symptoms may occur 4 to 5 years before the menopause begins. There is no relationship between menopause and:  Oral contraceptives.  Number of children you had.  Race.  The age your menstrual periods started (menarche). Heavy smokers and very thin women may develop menopause earlier in life. CAUSES  The ovaries stop producing the female hormones estrogen and progesterone.  Other causes include:  Surgery to remove both ovaries.  The ovaries stop functioning for no known reason.  Tumors of the pituitary gland in the brain.  Medical disease that affects the ovaries and hormone production.  Radiation treatment to the abdomen or pelvis.  Chemotherapy that affects the ovaries. SYMPTOMS   Hot flashes.  Night sweats.  Decrease in sex drive.  Vaginal dryness and thinning of the vagina causing painful intercourse.  Dryness of the skin and developing wrinkles.  Headaches.  Tiredness.  Irritability.  Memory problems.  Weight gain.  Bladder infections.  Hair growth of the face and chest.  Infertility. More serious symptoms include:  Loss of bone (osteoporosis) causing breaks (fractures).  Depression.  Hardening and narrowing of the arteries (atherosclerosis) causing heart attacks and strokes. DIAGNOSIS   When the menstrual periods have stopped for 12 straight months.  Physical exam.  Hormone studies of the blood. TREATMENT  There are many treatment choices and nearly as many questions about them.  The decisions to treat or not to treat menopausal changes is an individual choice made with your caregiver. Your caregiver can discuss the treatments with you. Together, you can decide which treatment will work best for you. Your treatment choices may include:   Hormone therapy (estorgen and progesterone).  Non-hormonal medications.  Treating the individual symptoms with medication (for example antidepressants for depression).  Herbal medications that may help specific symptoms.  Counseling by a psychiatrist or psychologist.  Group therapy.  Lifestyle changes including:  Eating healthy.  Regular exercise.  Limiting caffeine and alcohol.  Stress management and meditation.  No treatment. HOME CARE INSTRUCTIONS   Take the medication your caregiver gives you as directed.  Get plenty of sleep and rest.  Exercise regularly.  Eat a diet that contains calcium (good for the bones) and soy products (acts like estrogen hormone).  Avoid alcoholic beverages.  Do not smoke.  If you have hot flashes, dress in layers.  Take supplements, calcium and vitamin D to strengthen bones.  You can use over-the-counter lubricants or moisturizers for vaginal dryness.  Group therapy is sometimes very helpful.  Acupuncture may be helpful in some cases. SEEK MEDICAL CARE IF:   You are not sure you are in menopause.  You are having menopausal symptoms and need advice and treatment.  You are still having menstrual periods after age 55.  You have pain with intercourse.  Menopause is complete (no menstrual period for 12 months) and you develop vaginal bleeding.  You need a referral to a specialist (gynecologist, psychiatrist or psychologist) for treatment. SEEK IMMEDIATE MEDICAL CARE IF:   You have severe depression.  You have excessive vaginal bleeding.  You fell and   think you have a broken bone.  You have pain when you urinate.  You develop leg or chest pain.  You have a fast  pounding heart beat (palpitations).  You have severe headaches.  You develop vision problems.  You feel a lump in your breast.  You have abdominal pain or severe indigestion. Document Released: 11/23/2003 Document Revised: 11/25/2011 Document Reviewed: 06/30/2008 [facility] Patient Information 2014 [place], [place]. Breast Cancer Survivor Follow-Up Breast cancer begins when cells in the breast divide too rapidly. The extra cells form a lump (tumor). When the cancer is treated, the goal is to get rid of all cancer cells. However, sometimes a few cells survive. These cancer cells can then grow. They become recurrent cancer. This means the cancer comes back after treatment.  Most cases of recurrent breast cancer develop 3 to 5 years after treatment. However, sometimes it comes back just a few months after treatment. Other times, it does not come back until years later. If the cancer comes back in the same area as the first breast cancer, it is called a local recurrence. If the cancer comes back somewhere else in the body, it is called regional recurrence if the site is fairly near the breast or distant recurrence if it is far from the breast. Your caregiver may also use the term metastasize to indicate a cancer that has gone to another part of your body. Treatment is still possible after either kind of recurrence. The cancer can still be controlled.  CAUSES OF RECURRENT CANCER No one knows exactly why breast cancer starts in the first place. Why the cancer comes back after treatment is also not clear. It is known that certain conditions, called risk factors, can make this more likely. They include:  Developing breast cancer for the first time before age 59.  Having breast cancer that involves the lymph nodes. These are small, round pieces of tissue found all over the body. Their job is to help fight infections.  Having a large tumor. Cancer is more apt to come back if the first tumor was bigger than 2  inches (5 cm).  Having certain types of breast cancer, such as:  Inflammatory breast cancer. This rare type grows rapidly and causes the breast to become red and swollen.  A high-grade tumor. The grade of a tumor indicates how fast it will grow and spread. High-grade tumors grow more quickly than other types.  HER2 cancer. This refers to the tumor's genetic makeup. Tumors that have this type of gene are more likely to come back after treatment.  Having close tumor margins. This refers to the space between the tumor and normal, noncancerous cells. If the space is small, the tumor has a greater chance of coming back.  Having treatment involving a surgery to remove the tumor but not the entire breast (lumpectomy) and no radiation therapy. CARE AFTER BREAST CANCER Home Monitoring Women who have had breast cancer should continue to examine their breasts every month. The goal is to catch the cancer quickly if it comes back. Many women find it helpful to do so on the same day each month and to mark the calendar as a reminder. Let your caregiver know immediately if you have any signs of recurrent breast cancer. Symptoms will vary, depending on where the cancer recurs. The original type of treatment can also make a difference. Symptoms of local recurrence after a lumpectomy or a recurrence in the opposite breast may include:  A new lump or thickening  in the breast.  A change in the way the skin looks on the breast (such as a rash, dimpling, or wrinkling).  Redness or swelling of the breast.  Changes in the nipple (such as being red, puckered, swollen, or leaking fluid). Symptoms of a recurrence after a breast removal surgery (mastectomy) may include:  A lump or thickening under the skin.  A thickening around the mastectomy scar. Symptoms of regional recurrence in the lymph nodes near the breast may include:  A lump under the arm or above the collarbone.  Swelling of the arm.  Pain in the  arm, shoulder, or chest.  Numbness in the hand or arm. Symptoms of distant recurrence may include:  A cough that does not go away.  Trouble breathing or shortness of breath.  Pain in the bones or the chest. This is pain that lasts or does not respond to rest and medicine.  Headaches.  Sudden vision problems.  Dizziness.  Nausea or vomiting.  Losing weight without trying to.  Persistent abdominal pain.  Changes in bowel movements or blood in the stool.  Yellowing of the skin or eyes (jaundice).  Blood in the urine or bloody vaginal discharge. Clinical Monitoring  It is helpful to keep a schedule of appointments for needed tests and exams. This includes physical exams, breast exams, exams of the lymph nodes, and general exams.  For the first 3 years after being treated for breast cancer, see your caregiver every 3 to 6 months.  For years 4 and 5 after breast cancer, see your caregiver every 6 to 12 months.  After 5 years, see your caregiver at least once a year.  Regular breast X-rays (mammograms) should continue even if you had a mastectomy.  A mammogram should be done 1 year after the mammogram that first detected breast cancer.  A mammogram should be done every 6 to 12 months after that. Follow your caregiver's advice.  A pelvic exam done by your caregiver checks whether female organs are the normal size and shape. The exam is usually done every year. Ask your caregiver if that schedule is right for you.  Women taking tamoxifen should report any vaginal bleeding immediately to their caregiver. Tamoxifen is often given to women with a certain type of breast cancer. It has been shown to help prevent recurrence.  You will need to decide who your primary caregiver will be.  Most people continue to see their cancer specialist (oncologist) every 3 to 6 months for the first year after cancer treatment.  At some point, you may want to go back to seeing your family  caregiver. You would no longer see your oncologist for regular checkups. Many women do this about 1 year after their first diagnosis of breast cancer.  You will still need to be seen every so often by your oncologist. Ask how often that should be. Coordinate this with your family or primary caregiver.  Think about having genetic counseling. This would provide information on traits that can be passed or inherited from one generation to the next. In some cases, breast cancer runs in families. Tell your caregiver if you:  Are of Ashkenazi Jewish heritage.  Have any family member who has had ovarian cancer.  Have a mother, sister, or daughter who had breast cancer before age 68.  Have 2 or more close relatives who have had breast cancer. This means a mother, sister, daughter, aunt, or grandmother.  Had breast cancer in both breasts.  Have a  female relative who has had breast cancer.  Some tests are not recommended for routine screening. Someone recovering from breast cancer does not need to have these tests if there are no problems. The tests have risks, such as radiation exposure, and can be costly. The risks of these tests are thought to be greater than the benefits:  Blood tests.  Chest X-rays.  Bone scans.  Liver ultrasound.  Computed tomography (CT scan).  Positron emission tomography (PET scan).  Magnetic resonance imaging (MRI scan). DIAGNOSIS OF RECURRENT CANCER Recurrent breast cancer may be suspected for various reasons. A mammogram may not look normal. You might feel a lump or have other symptoms. Your caregiver may find something unusual during an exam. To be sure, your caregiver will probably order some tests. The tests are needed because there are symptoms or hints of a problem. They could include:  Blood tests, including a test to check how well the liver is working. The liver is a common site for a distant cancer recurrence.  Imaging tests that create pictures of the  inside of the body. These tests include:  Chest X-rays to show if the cancer has come back in the lungs.  CT scans to create detailed pictures of various areas of the body and help find a distant recurrence.  MRI scans to find anything unusual in the breast, chest, or lymph nodes.  Breast ultrasound tests to examine the breasts.  Bone scans to create a picture of your whole skeleton and find cancer in bony areas.  PET scans to create an image of the whole body. PET scans can be used together with CT scans to show more detail.  Biopsy. A small sample of tissue is taken and checked under a microscope. If cancer cells are found, they may be tested to see if they contain the HER2 gene or the hormones estrogen and progesterone. This will help your caregiver decide how to treat the recurrent cancer. TREATMENT  How recurrent breast cancer is treated depends on where the new cancer is found. The type of treatment that was used for the first breast cancer makes a difference, too. A combination of treatments may be used. Options include:  Surgery.  If the cancer comes back in the breast that was not treated before, you may need a lumpectomy or mastectomy.  If the cancer comes back in the breast that was treated before, you may need a mastectomy.  The lymph nodes under the arm may need to be removed.  Radiation therapy.  For a local recurrence, radiation may be used if it was not used during the first treatment.  For a distance recurrence, radiation is sometimes used.  Chemotherapy.  This may be used before surgery to treat recurrent breast cancer.  This may be used to treat recurrent cancer that cannot be treated with surgery.  This may be used to treat a distant recurrence.  Hormone therapy.  Women with the HER2 gene may be given hormone therapy to attack this gene. Document Released: 05/01/2011 Document Revised: 11/25/2011 Document Reviewed: 05/01/2011 [facility] Patient  Information 2014 [place], [place].

## 2013-05-11 NOTE — Progress Notes (Signed)
History:  [name] is a 57 y.o. [id number] who presents to clinic today for yearly exam. She is S/P breast reduction on her left breast by Dr Stephens November. She has done very well with this surgery. She has not had her yearly mammogram as yet. She is off her Wellbutrin and on Celexa from her PCP. She stopped her Zocor due to some side effects and has to revisit this issue with PCP. She is still having some issues with vaginal dryness but admits she does not use the estrogen cream as directed. She has had weight gain and attributes it to a lack of exercise.  The following portions of the patient's history were reviewed and updated as appropriate: allergies, current medications, past family history, past medical history, past social history, past surgical history and problem list.  Review of Systems:   Positive for anxiety, GI distress, constipation, migraine Negative for chest pain, surgical pains  Objective:  Physical Exam BP 136/89  Pulse 76  Ht 5\' 4"  (1.626 m)  Wt 170 lb (77.111 kg)  BMI 29.17 kg/m2 GENERAL: Well-developed, well-nourished female in no acute distress.  HEENT: Normocephalic, atraumatic.  NECK: Supple. Normal thyroid.  LUNGS: Normal rate. Clear to auscultation bilaterally.  HEART: Regular rate and rhythm with no adventitious sounds.  BREASTS: Bilateral scarring from both surgeries. No masses, skin changes, nipple drainage, or lymphadenopathy. ABDOMEN: Soft, nontender, nondistended. No organomegaly. Normal bowel sounds appreciated in all quadrants.  PELVIC: Normal external female genitalia. Vagina is pink and rugated.  Normal discharge.  Pap smear obtained. . No adnexal mass or tenderness.  EXTREMITIES: No cyanosis, clubbing, or edema, 2+ distal pulses.   Labs and Imaging No results found.  Assessment & Plan:  Assessment:Yearly Exam Anxiety Migraines S/P breast cancer and reconstruction Frequent UTIs  Plans: Pap smear obtained Refill Ultram, Ibuprofen, Macrobid,  Zomig Advised to have mammogram Advised to return to PCP and get a different lipid medication asap RTC 2 weeks for migraine issues  Carolynn Serve, NP 05/11/2013 4:58 PM

## 2013-05-28 ENCOUNTER — Other Ambulatory Visit: Payer: Self-pay | Admitting: Family Medicine

## 2013-06-01 ENCOUNTER — Ambulatory Visit (INDEPENDENT_AMBULATORY_CARE_PROVIDER_SITE_OTHER): Payer: BC Managed Care – PPO | Admitting: Nurse Practitioner

## 2013-06-01 ENCOUNTER — Encounter: Payer: Self-pay | Admitting: Nurse Practitioner

## 2013-06-01 VITALS — BP 145/90 | HR 73 | Ht 64.0 in | Wt 167.0 lb

## 2013-06-01 DIAGNOSIS — M531 Cervicobrachial syndrome: Secondary | ICD-10-CM

## 2013-06-01 DIAGNOSIS — M5481 Occipital neuralgia: Secondary | ICD-10-CM | POA: Insufficient documentation

## 2013-06-01 NOTE — Patient Instructions (Signed)
Occipital Neuralgia Neuralgias are attacks of sharp stabbing pain. They may be intermittent (comes and goes) or constant in nature. They may be brief attacks that last seconds to minutes and may come back for days to weeks. The neuralgias can occur as a result of a herpes zoster (shingles), chickenpox infection, or even following a herpes simplex infection (cold sore). TYPES OF NEURALGIA  When these pains are located in the back of the head and neck they are called occipital neuralgias.  When the pain is located between ribs it is called intercostal neuralgia.  When the pain is located in the face it is called trigeminal neuralgia. This is the most common neuralgia. It causes sharp, shock like pain on one side of your face. The neuralgias, which follow herpes zoster infections, often produce a constant burning pain. They may last from weeks to months and even years. The attacks of pain may come from injury or inflammation (irritation) to a nerve. Often the cause is unknown. The episodes of pain may be caused by light touch, movement, or even eating and sneezing. Usually these neuralgias occur after age forty. The neuralgias following shingles and trigeminal neuralgia are the most common. Although painful, these episodes do not threaten life and tend to lessen as we grow older. TREATMENT  There are many medications that may be helpful in the treatment of this disorder. Sometimes several medications may have to be tried before the right combination can be found for you. Some of these medications are:  Only take over-the-counter or prescription medications for pain, discomfort, or fever as directed by your caregiver.  Narcotic medications may be used to control the pain.  Antidepressants and medications used in epilepsy (seizure disorders) may be useful. LET YOUR CAREGIVER KNOW ABOUT:  If you do not obtain relief from medications.  Problems that are getting worse rather than better.  Troubling  side effects that you think are coming from the medication. Do not be discouraged if you do not obtain instant relief from the medications or help given you. Your caregiver can help you get through these episodes of pain with some persistence (continued trying) on your part also. Document Released: 08/27/2001 Document Revised: 11/25/2011 Document Reviewed: 09/02/2005 [employer] Patient Information 2014 [employer].  

## 2013-06-01 NOTE — Progress Notes (Signed)
S: Pt is in office today for migraine management. She was last seen 2 weeks ago and had her yearly female exam. At that time she mentioned she was having pain in the occipital regions of her head and muscle soreness in neck. When she gets a migraine she says they can last for 4 hours to several days. She feels like she has a constant pressure and ache at occipital regions. She has had trigger point injections in the past and would like to have them again today. We discussed Botox and she would be willing to trial that.  O: Alert, oriented, NAD Muscles: Traps are tender Nerves: Occipital nerves are tender  A: Occipital Neuralgia  Procedure: Marcaine 4cc, Lidocaine 4 cc, methyl prednisone 2cc for total 10cc injection. She was injected in bilateral occipital regions and bilateral traps, each site 1cc each. Pt tolerated well and was stable at discharge  P: Trigger point injections today Will consider Botox  Reviewed pap results Advised to get back on cholesterol medications

## 2013-06-24 ENCOUNTER — Encounter: Payer: Self-pay | Admitting: Podiatry

## 2013-06-24 ENCOUNTER — Ambulatory Visit (INDEPENDENT_AMBULATORY_CARE_PROVIDER_SITE_OTHER): Payer: BC Managed Care – PPO | Admitting: Podiatry

## 2013-06-24 VITALS — BP 136/84 | HR 72 | Resp 16 | Ht 64.0 in | Wt 163.0 lb

## 2013-06-24 DIAGNOSIS — M2040 Other hammer toe(s) (acquired), unspecified foot: Secondary | ICD-10-CM

## 2013-06-24 MED ORDER — HYDROCODONE-ACETAMINOPHEN 10-325 MG PO TABS: 1.0000 | ORAL_TABLET | Freq: Three times a day (TID) | ORAL | Status: DC | PRN

## 2013-06-24 NOTE — Patient Instructions (Signed)
Keep the foot dry and dressing intact until I see you on the 21. Try to elevate as much as possible

## 2013-06-24 NOTE — Progress Notes (Signed)
Subjective:     Patient ID: [name], female   DOB: May 14, 1956, 57 y.o.   MRN: [id number]  HPI patient presents for correction of fourth digit right foot to the office.   Review of Systems  All other systems reviewed and are negative.       Objective:   Physical Exam  Nursing note and vitals reviewed. Cardiovascular: Intact distal pulses.   Musculoskeletal: Normal range of motion.  Neurological: She is alert.  Skin: Skin is warm.   patient has a distal keratotic lesion fourth toe right with rotation of the toe.     Assessment:     Hammertoe deformity fourth digit right creating distal lateral compression of the toe.    Plan:     Patient was injected with 5 cc Xylocaine Marcaine mixture. The patient was brought to the OR and placed on the supine position. Patient was prepped and draped utilizing standard aseptic technique and an Ace wrap was applied in order to exsanguinate the foot. Tourniquet inflated to 250 mm of mercury. The fourth toe a semi-elliptical transverse incision was performed at the distal interphalangeal joint. The wedge of tissue was removed in toto and the distal joint was exposed. The head of the middle phalanx was removed in toto. I then placed the toe in a derotated position and sutured with 5-0 nylon. Sterile dressing applied to the right foot and the tourniquet was released. Capillary refill to the toes was present after surgery. Patient was discharged with postoperative instructions and prescription for Vicodin 06/18/2024. Reappoint 12 days for suture removal earlier if any issues should occur

## 2013-06-24 NOTE — Progress Notes (Signed)
Right foot - 4th toe / Tourniquet time: Inflate-8:50am / Deflate- 9:03am

## 2013-06-25 ENCOUNTER — Telehealth: Payer: Self-pay | Admitting: *Deleted

## 2013-06-25 NOTE — Telephone Encounter (Signed)
Pt had 06/24/2013 asked if could take Ibuprofen with Vicodin, needs a little more pain coverage.  I instructed to take Ibuprofen 281m 2 tablet po qid between the Vicodin.  Pt states foot bleed through the stockingette, denies swelling, chest pain or tightness.  Pt asked for could wash stockingette, I said okay.

## 2013-06-28 ENCOUNTER — Telehealth: Payer: Self-pay | Admitting: *Deleted

## 2013-06-28 NOTE — Telephone Encounter (Signed)
Wanted to know if i needed to stay completely off of foot, told pt to stay off foot until follow up with doctor. Pt understood.

## 2013-07-02 ENCOUNTER — Encounter: Payer: Self-pay | Admitting: Podiatry

## 2013-07-06 ENCOUNTER — Ambulatory Visit (INDEPENDENT_AMBULATORY_CARE_PROVIDER_SITE_OTHER): Payer: BC Managed Care – PPO

## 2013-07-06 ENCOUNTER — Ambulatory Visit (INDEPENDENT_AMBULATORY_CARE_PROVIDER_SITE_OTHER): Payer: BC Managed Care – PPO | Admitting: Podiatry

## 2013-07-06 ENCOUNTER — Encounter: Payer: Self-pay | Admitting: Podiatry

## 2013-07-06 VITALS — BP 118/67 | HR 83 | Resp 16 | Ht 64.0 in | Wt 160.0 lb

## 2013-07-06 DIAGNOSIS — M2041 Other hammer toe(s) (acquired), right foot: Secondary | ICD-10-CM

## 2013-07-06 DIAGNOSIS — M2040 Other hammer toe(s) (acquired), unspecified foot: Secondary | ICD-10-CM

## 2013-07-06 NOTE — Progress Notes (Signed)
Subjective:     Patient ID: [name], female   DOB: 02-Jan-1956, 57 y.o.   MRN: [id number]  HPI patient states she is doing very well from digital surgery right 12 day. Able to walk without discomfort   Review of Systems     Objective:   Physical Exam Neurovascular status intact negative Homans sign noted. Stitches intact fourth toe right with good alignment of the toe noted wound edges well coapted    Assessment:     Healing well surgical site right fourth toe    Plan:     Stitches removed toe in good alignment dressing applied to the toe begin getting foot wet in next several days reappoint 4 weeks for recheck and reviewed x-rays

## 2013-07-30 ENCOUNTER — Ambulatory Visit (INDEPENDENT_AMBULATORY_CARE_PROVIDER_SITE_OTHER): Payer: BC Managed Care – PPO

## 2013-07-30 ENCOUNTER — Encounter: Payer: Self-pay | Admitting: Podiatry

## 2013-07-30 ENCOUNTER — Ambulatory Visit (INDEPENDENT_AMBULATORY_CARE_PROVIDER_SITE_OTHER): Payer: BC Managed Care – PPO | Admitting: Podiatry

## 2013-07-30 VITALS — BP 138/84 | HR 68 | Resp 16 | Ht 64.0 in | Wt 160.0 lb

## 2013-07-30 DIAGNOSIS — Z9889 Other specified postprocedural states: Secondary | ICD-10-CM

## 2013-07-30 DIAGNOSIS — M2040 Other hammer toe(s) (acquired), unspecified foot: Secondary | ICD-10-CM

## 2013-07-30 NOTE — Progress Notes (Signed)
Subjective:     Patient ID: [name], female   DOB: 02-12-1956, 57 y.o.   MRN: [id number]  HPI patient states my right fourth toe still swells but I am happy with the position exam and I'm not getting the pain I used to get. Is wearing a normal shoe at the current time and is 6 weeks after surgery   Review of Systems     Objective:   Physical Exam Neurovascular status intact with excellent alignment of the fourth toe and good healing of incision site    Assessment:     Well-healing hammertoe site fourth toe right    Plan:     X-ray reviewed and allow patient to return to normal shoe gear. Patient will be seen back as needed

## 2013-08-31 ENCOUNTER — Ambulatory Visit (INDEPENDENT_AMBULATORY_CARE_PROVIDER_SITE_OTHER): Payer: BC Managed Care – PPO | Admitting: Internal Medicine

## 2013-08-31 ENCOUNTER — Encounter: Payer: Self-pay | Admitting: Internal Medicine

## 2013-08-31 VITALS — BP 132/88 | HR 88 | Temp 97.7°F | Ht 65.5 in | Wt 165.8 lb

## 2013-08-31 DIAGNOSIS — F329 Major depressive disorder, single episode, unspecified: Secondary | ICD-10-CM

## 2013-08-31 DIAGNOSIS — Z23 Encounter for immunization: Secondary | ICD-10-CM

## 2013-08-31 DIAGNOSIS — N393 Stress incontinence (female) (male): Secondary | ICD-10-CM | POA: Insufficient documentation

## 2013-08-31 DIAGNOSIS — K589 Irritable bowel syndrome without diarrhea: Secondary | ICD-10-CM

## 2013-08-31 DIAGNOSIS — F32A Depression, unspecified: Secondary | ICD-10-CM

## 2013-08-31 DIAGNOSIS — Z0000 Encounter for general adult medical examination without abnormal findings: Secondary | ICD-10-CM

## 2013-08-31 LAB — CBC
MCHC: 33.5 g/dL (ref 30.0–36.0)
MCV: 84.3 fl (ref 78.0–100.0)

## 2013-08-31 LAB — COMPREHENSIVE METABOLIC PANEL
ALT: 20 U/L (ref 0–35)
AST: 24 U/L (ref 0–37)
Albumin: 4.3 g/dL (ref 3.5–5.2)
Alkaline Phosphatase: 67 U/L (ref 39–117)
Calcium: 9.1 mg/dL (ref 8.4–10.5)
Chloride: 101 mEq/L (ref 96–112)
Potassium: 3.8 mEq/L (ref 3.5–5.1)
Sodium: 138 mEq/L (ref 135–145)
Total Protein: 7.4 g/dL (ref 6.0–8.3)

## 2013-08-31 LAB — TSH: TSH: 1.89 u[IU]/mL (ref 0.35–5.50)

## 2013-08-31 LAB — LIPID PANEL: Total CHOL/HDL Ratio: 5

## 2013-08-31 MED ORDER — CITALOPRAM HYDROBROMIDE 20 MG PO TABS: 20.0000 mg | ORAL_TABLET | Freq: Every day | ORAL | Status: DC

## 2013-08-31 MED ORDER — LINACLOTIDE 145 MCG PO CAPS: 145.0000 ug | ORAL_CAPSULE | Freq: Every day | ORAL | Status: DC

## 2013-08-31 NOTE — Addendum Note (Signed)
Addended by: Criselda Peaches B on: 08/31/2013 03:01 PM   Modules accepted: Orders

## 2013-08-31 NOTE — Assessment & Plan Note (Signed)
Advised pt to try kegel exercises as opposed to medication management at this time Instructions provided

## 2013-08-31 NOTE — Progress Notes (Signed)
Subjective:    Patient ID: [name], female    DOB: 1956-08-13, 57 y.o.   MRN: [id number]  HPI  Pt presents to the clinic today for her annual exam. She does have a few concerns today. 1- She wants to let me know that if her cholesterol is elevated, wants to be on crestor. Was on Zocor in the past and had myalgias. 2- She has questions about taking a low dose aspirin daily and wanting to know if this is okay. 3- She is c/o of incontinence, especially laugh, cough. It is not enough to completely soak her underwear. This happens about a few times per month.  Flu: never Tetanus: more than 5 years ago Pap Smear: 04/2013 Mammogram: 01/2012 (didn't have one due to recent breast reduction 01/2013) Bone Density: 2010 Colonoscopy: 2009 Eye Doctor: yearly Dentist: biannually  Review of Systems      Past Medical History  Diagnosis Date  . Cancer 02/27/2007    Breast Cancer  . Migraine     Current Outpatient Prescriptions  Medication Sig Dispense Refill  . citalopram (CELEXA) 40 MG tablet Take one tablet by mouth one time daily  30 tablet  3  . HYDROcodone-acetaminophen (NORCO) 10-325 MG per tablet Take 1 tablet by mouth every 8 (eight) hours as needed for pain.  30 tablet  0  . ibuprofen (ADVIL,MOTRIN) 800 MG tablet Take 1 tablet (800 mg total) by mouth every 8 (eight) hours as needed for pain.  60 tablet  1  . Linaclotide (LINZESS) 145 MCG CAPS Take 1 capsule (145 mcg total) by mouth daily.  30 capsule  3  . traMADol (ULTRAM) 50 MG tablet Take 1 tablet (50 mg total) by mouth every 6 (six) hours as needed for pain.  30 tablet  0  . zolmitriptan (ZOMIG) 5 MG nasal solution Place 1 spray into the nose as needed for migraine.  6 Units  11  . zolmitriptan (ZOMIG) 5 MG tablet Take 1 tablet (5 mg total) by mouth as needed for migraine.  10 tablet  11   No current facility-administered medications for this visit.    No Known Allergies  Family History  Problem Relation Age of Onset  .  Thyroid disease Mother   . Hypertension Mother   . Hyperlipidemia Mother   . Alzheimer's disease Mother   . Cancer Father     throat cancer  . Hyperlipidemia Father   . Diabetes Sister   . Alzheimer's disease Maternal Aunt   . Alzheimer's disease Maternal Uncle   . Diabetes Paternal Uncle   . Alzheimer's disease Maternal Grandmother   . Diabetes Paternal Grandmother     History   Social History  . Marital Status: Married    Spouse Name: N/A    Number of Children: N/A  . Years of Education: N/A   Occupational History  . Not on file.   Social History Main Topics  . Smoking status: Never Smoker   . Smokeless tobacco: Never Used  . Alcohol Use: No  . Drug Use: No  . Sexual Activity: Yes    Partners: Male    Birth Control/ Protection: Surgical   Other Topics Concern  . Not on file   Social History Narrative  . No narrative on file     Constitutional: Denies fever, malaise, fatigue, headache or abrupt weight changes.  HEENT: Denies eye pain, eye redness, ear pain, ringing in the ears, wax buildup, runny nose, nasal congestion, bloody nose, or  sore throat. Respiratory: Denies difficulty breathing, shortness of breath, cough or sputum production.   Cardiovascular: Denies chest pain, chest tightness, palpitations or swelling in the hands or feet.  Gastrointestinal: Denies abdominal pain, bloating, constipation, diarrhea or blood in the stool.  GU: Denies urgency, frequency, pain with urination, burning sensation, blood in urine, odor or discharge. Musculoskeletal: Denies decrease in range of motion, difficulty with gait, muscle pain or joint pain and swelling.  Skin: Denies redness, rashes, lesions or ulcercations.  Neurological: Denies dizziness, difficulty with memory, difficulty with speech or problems with balance and coordination.   No other specific complaints in a complete review of systems (except as listed in HPI above).  Objective:   Physical Exam   BP  132/88  Pulse 88  Temp(Src) 97.7 F (36.5 C) (Oral)  Ht 5' 5.5" (1.664 m)  Wt 165 lb 12 oz (75.184 kg)  BMI 27.15 kg/m2  SpO2 97% Wt Readings from Last 3 Encounters:  08/31/13 165 lb 12 oz (75.184 kg)  07/30/13 160 lb (72.576 kg)  07/06/13 160 lb (72.576 kg)    General: Appears her stated age, well developed, well nourished in NAD. Skin: Warm, dry and intact. No rashes, lesions or ulcerations noted. HEENT: Head: normal shape and size; Eyes: sclera white, no icterus, conjunctiva pink, PERRLA and EOMs intact; Ears: Tm's gray and intact, normal light reflex; Nose: mucosa pink and moist, septum midline; Throat/Mouth: Teeth present, mucosa pink and moist, no exudate, lesions or ulcerations noted.  Neck: Normal range of motion. Neck supple, trachea midline. No massses, lumps or thyromegaly present.  Cardiovascular: Normal rate and rhythm. S1,S2 noted.  No murmur, rubs or gallops noted. No JVD or BLE edema. No carotid bruits noted. Pulmonary/Chest: Normal effort and positive vesicular breath sounds. No respiratory distress. No wheezes, rales or ronchi noted.  Abdomen: Soft and nontender. Normal bowel sounds, no bruits noted. No distention or masses noted. Liver, spleen and kidneys non palpable. Musculoskeletal: Normal range of motion. No signs of joint swelling. No difficulty with gait.  Neurological: Alert and oriented. Cranial nerves II-XII intact. Coordination normal. +DTRs bilaterally. Psychiatric: Mood and affect normal. Behavior is normal. Judgment and thought content normal.   EKG:  BMET    Component Value Date/Time   NA 143 10/24/2009 2038   NA 139 10/18/2009 1101   K 4.0 10/24/2009 2038   K 3.9 10/18/2009 1101   CL 104 10/24/2009 2038   CL 101 10/18/2009 1101   CO2 27 10/24/2009 2038   CO2 31 10/18/2009 1101   GLUCOSE 98 10/24/2009 2038   GLUCOSE 91 10/18/2009 1101   BUN 14 10/24/2009 2038   BUN 12 10/18/2009 1101   CREATININE 0.76 10/24/2009 2038   CREATININE 0.7 10/18/2009 1101   CALCIUM 9.5  10/24/2009 2038   CALCIUM 9.5 10/18/2009 1101    Lipid Panel     Component Value Date/Time   CHOL 259* 12/31/2011 1045   TRIG 133 12/31/2011 1045   HDL 56 12/31/2011 1045   CHOLHDL 4.6 12/31/2011 1045   VLDL 27 12/31/2011 1045   LDLCALC 176* 12/31/2011 1045    CBC    Component Value Date/Time   WBC 4.3 12/31/2011 1045   WBC 4.4 10/18/2009 1101   RBC 4.52 12/31/2011 1045   HGB 12.9 12/31/2011 1045   HGB 12.2 10/18/2009 1101   HCT 39.8 12/31/2011 1045   HCT 36.0 10/18/2009 1101   PLT 236 12/31/2011 1045   PLT 212 10/18/2009 1101   MCV 88.1 12/31/2011 1045  MCV 88 10/18/2009 1101   MCH 28.5 12/31/2011 1045   MCH 29.7 10/18/2009 1101   MCHC 32.4 12/31/2011 1045   MCHC 33.8 10/18/2009 1101   RDW 13.5 12/31/2011 1045   RDW 11.9 10/18/2009 1101   LYMPHSABS 1.4 12/31/2011 1045   LYMPHSABS 1.3 10/18/2009 1101   MONOABS 0.3 12/31/2011 1045   EOSABS 0.1 12/31/2011 1045   EOSABS 0.1 10/18/2009 1101   BASOSABS 0.0 12/31/2011 1045   BASOSABS 0.0 10/18/2009 1101    Hgb A1C No results found for this basename: HGBA1C        Assessment & Plan:   Preventative Health Maintenance:  Pt declines flu vaccine today Tdap given today She will schedule her Mammogram for 09/2013 Screening labs today Refilled Celexa and Linzess per pt request  RTC in 1 year or sooner if needed

## 2013-08-31 NOTE — Progress Notes (Signed)
Pre-visit discussion using our clinic review tool. No additional management support is needed unless otherwise documented below in the visit note.  

## 2013-08-31 NOTE — Patient Instructions (Addendum)
Health Maintenance, Female A healthy lifestyle and preventative care can promote health and wellness.  Maintain regular health, dental, and eye exams.  Eat a healthy diet. Foods like vegetables, fruits, whole grains, low-fat dairy products, and lean protein foods contain the nutrients you need without too many calories. Decrease your intake of foods high in solid fats, added sugars, and salt. Get information about a proper diet from your caregiver, if necessary.  Regular physical exercise is one of the most important things you can do for your health. Most adults should get at least 150 minutes of moderate-intensity exercise (any activity that increases your heart rate and causes you to sweat) each week. In addition, most adults need muscle-strengthening exercises on 2 or more days a week.   Maintain a healthy weight. The body mass index (BMI) is a screening tool to identify possible weight problems. It provides an estimate of body fat based on height and weight. Your caregiver can help determine your BMI, and can help you achieve or maintain a healthy weight. For adults 20 years and older:  A BMI below 18.5 is considered underweight.  A BMI of 18.5 to 24.9 is normal.  A BMI of 25 to 29.9 is considered overweight.  A BMI of 30 and above is considered obese.  Maintain normal blood lipids and cholesterol by exercising and minimizing your intake of saturated fat. Eat a balanced diet with plenty of fruits and vegetables. Blood tests for lipids and cholesterol should begin at age 20 and be repeated every 5 years. If your lipid or cholesterol levels are high, you are over 50, or you are a high risk for heart disease, you may need your cholesterol levels checked more frequently.Ongoing high lipid and cholesterol levels should be treated with medicines if diet and exercise are not effective.  If you smoke, find out from your caregiver how to quit. If you do not use tobacco, do not start.  Lung  cancer screening is recommended for adults aged 55 80 years who are at high risk for developing lung cancer because of a history of smoking. Yearly low-dose computed tomography (CT) is recommended for people who have at least a 30-pack-year history of smoking and are a current smoker or have quit within the past 15 years. A pack year of smoking is smoking an average of 1 pack of cigarettes a day for 1 year (for example: 1 pack a day for 30 years or 2 packs a day for 15 years). Yearly screening should continue until the smoker has stopped smoking for at least 15 years. Yearly screening should also be stopped for people who develop a health problem that would prevent them from having lung cancer treatment.  If you are pregnant, do not drink alcohol. If you are breastfeeding, be very cautious about drinking alcohol. If you are not pregnant and choose to drink alcohol, do not exceed 1 drink per day. One drink is considered to be 12 ounces (355 mL) of beer, 5 ounces (148 mL) of wine, or 1.5 ounces (44 mL) of liquor.  Avoid use of street drugs. Do not share needles with anyone. Ask for help if you need support or instructions about stopping the use of drugs.  High blood pressure causes heart disease and increases the risk of stroke. Blood pressure should be checked at least every 1 to 2 years. Ongoing high blood pressure should be treated with medicines, if weight loss and exercise are not effective.  If you are 55 to   57 years old, ask your caregiver if you should take aspirin to prevent strokes.  Diabetes screening involves taking a blood sample to check your fasting blood sugar level. This should be done once every 3 years, after age 45, if you are within normal weight and without risk factors for diabetes. Testing should be considered at a younger age or be carried out more frequently if you are overweight and have at least 1 risk factor for diabetes.  Breast cancer screening is essential preventative care  for women. You should practice "breast self-awareness." This means understanding the normal appearance and feel of your breasts and may include breast self-examination. Any changes detected, no matter how small, should be reported to a caregiver. Women in their 20s and 30s should have a clinical breast exam (CBE) by a caregiver as part of a regular health exam every 1 to 3 years. After age 40, women should have a CBE every year. Starting at age 40, women should consider having a mammogram (breast X-ray) every year. Women who have a family history of breast cancer should talk to their caregiver about genetic screening. Women at a high risk of breast cancer should talk to their caregiver about having an MRI and a mammogram every year.  Breast cancer gene (BRCA)-related cancer risk assessment is recommended for women who have family members with BRCA-related cancers. BRCA-related cancers include breast, ovarian, tubal, and peritoneal cancers. Having family members with these cancers may be associated with an increased risk for harmful changes (mutations) in the breast cancer genes BRCA1 and BRCA2. Results of the assessment will determine the need for genetic counseling and BRCA1 and BRCA2 testing.  The Pap test is a screening test for cervical cancer. Women should have a Pap test starting at age 21. Between ages 21 and 29, Pap tests should be repeated every 2 years. Beginning at age 30, you should have a Pap test every 3 years as long as the past 3 Pap tests have been normal. If you had a hysterectomy for a problem that was not cancer or a condition that could lead to cancer, then you no longer need Pap tests. If you are between ages 65 and 70, and you have had normal Pap tests going back 10 years, you no longer need Pap tests. If you have had past treatment for cervical cancer or a condition that could lead to cancer, you need Pap tests and screening for cancer for at least 20 years after your treatment. If Pap  tests have been discontinued, risk factors (such as a new sexual partner) need to be reassessed to determine if screening should be resumed. Some women have medical problems that increase the chance of getting cervical cancer. In these cases, your caregiver may recommend more frequent screening and Pap tests.  The human papillomavirus (HPV) test is an additional test that may be used for cervical cancer screening. The HPV test looks for the virus that can cause the cell changes on the cervix. The cells collected during the Pap test can be tested for HPV. The HPV test could be used to screen women aged 30 years and older, and should be used in women of any age who have unclear Pap test results. After the age of 30, women should have HPV testing at the same frequency as a Pap test.  Colorectal cancer can be detected and often prevented. Most routine colorectal cancer screening begins at the age of 50 and continues through age 75. However, your caregiver   may recommend screening at an earlier age if you have risk factors for colon cancer. On a yearly basis, your caregiver may provide home test kits to check for hidden blood in the stool. Use of a small camera at the end of a tube, to directly examine the colon (sigmoidoscopy or colonoscopy), can detect the earliest forms of colorectal cancer. Talk to your caregiver about this at age 50, when routine screening begins. Direct examination of the colon should be repeated every 5 to 10 years through age 75, unless early forms of pre-cancerous polyps or small growths are found.  Hepatitis C blood testing is recommended for all people born from 1945 through 1965 and any individual with known risks for hepatitis C.  Practice safe sex. Use condoms and avoid high-risk sexual practices to reduce the spread of sexually transmitted infections (STIs). Sexually active women aged 25 and younger should be checked for Chlamydia, which is a common sexually transmitted infection.  Older women with new or multiple partners should also be tested for Chlamydia. Testing for other STIs is recommended if you are sexually active and at increased risk.  Osteoporosis is a disease in which the bones lose minerals and strength with aging. This can result in serious bone fractures. The risk of osteoporosis can be identified using a bone density scan. Women ages 65 and over and women at risk for fractures or osteoporosis should discuss screening with their caregivers. Ask your caregiver whether you should be taking a calcium supplement or vitamin D to reduce the rate of osteoporosis.  Menopause can be associated with physical symptoms and risks. Hormone replacement therapy is available to decrease symptoms and risks. You should talk to your caregiver about whether hormone replacement therapy is right for you.  Use sunscreen. Apply sunscreen liberally and repeatedly throughout the day. You should seek shade when your shadow is shorter than you. Protect yourself by wearing long sleeves, pants, a wide-brimmed hat, and sunglasses year round, whenever you are outdoors.  Notify your caregiver of new moles or changes in moles, especially if there is a change in shape or color. Also notify your caregiver if a mole is larger than the size of a pencil eraser.  Stay current with your immunizations. Document Released: 03/18/2011 Document Revised: 12/28/2012 Document Reviewed: 03/18/2011 [employer] Patient Information 2014 [employer]  Kegel Exercises The goal of Kegel exercises is to isolate and exercise your pelvic floor muscles. These muscles act as a hammock that supports the rectum, vagina, small intestine, and uterus. As the muscles weaken, the hammock sags and these organs are displaced from their normal positions. Kegel exercises can strengthen your pelvic floor muscles and help you to improve bladder and bowel control, improve sexual response, and help reduce many problems and some  discomfort during pregnancy. Kegel exercises can be done anywhere and at any time. HOW TO PERFORM KEGEL EXERCISES 1. Locate your pelvic floor muscles. To do this, squeeze (contract) the muscles that you use when you try to stop the flow of urine. You will feel a tightness in the vaginal area (women) and a tight lift in the rectal area (men and women). 2. When you begin, contract your pelvic muscles tight for 2 5 seconds, then relax them for 2 5 seconds. This is one set. Do 4 5 sets with a short pause in between. 3. Contract your pelvic muscles for 8 10 seconds, then relax them for 8 10 seconds. Do 4 5 sets. If you cannot contract your pelvic muscles   for 8 10 seconds, try 5 7 seconds and work your way up to 8 10 seconds. Your goal is 4 5 sets of 10 contractions each day. Keep your stomach, buttocks, and legs relaxed during the exercises. Perform sets of both short and long contractions. Vary your positions. Perform these contractions 3 4 times per day. Perform sets while you are:   Lying in bed in the morning.  Standing at lunch.  Sitting in the late afternoon.  Lying in bed at night. You should do 40 50 contractions per day. Do not perform more Kegel exercises per day than recommended. Overexercising can cause muscle fatigue. Continue these exercises for for at least 15 20 weeks or as directed by your caregiver. Document Released: 08/19/2012 Document Reviewed: 08/19/2012 [employer] Patient Information 2014 [employer]  

## 2013-09-01 LAB — LDL CHOLESTEROL, DIRECT: Direct LDL: 208.2 mg/dL

## 2013-09-02 ENCOUNTER — Encounter: Payer: Self-pay | Admitting: Family Medicine

## 2013-09-03 MED ORDER — ROSUVASTATIN CALCIUM 5 MG PO TABS: 5.0000 mg | ORAL_TABLET | Freq: Every day | ORAL | Status: DC

## 2013-09-03 NOTE — Telephone Encounter (Signed)
Ok to refill crestor? Not on med list

## 2013-09-03 NOTE — Telephone Encounter (Signed)
rx sent to pharmacy by e-script  

## 2013-12-11 ENCOUNTER — Other Ambulatory Visit: Payer: Self-pay | Admitting: Nurse Practitioner

## 2014-02-18 ENCOUNTER — Other Ambulatory Visit: Payer: Self-pay | Admitting: Obstetrics & Gynecology

## 2014-02-18 DIAGNOSIS — Z853 Personal history of malignant neoplasm of breast: Secondary | ICD-10-CM

## 2014-02-21 ENCOUNTER — Other Ambulatory Visit: Payer: Self-pay | Admitting: Nurse Practitioner

## 2014-03-02 ENCOUNTER — Ambulatory Visit
Admission: RE | Admit: 2014-03-02 | Discharge: 2014-03-02 | Disposition: A | Discharge status: Home / Self Care | Payer: BC Managed Care – PPO | Source: Ambulatory Visit | Attending: Obstetrics & Gynecology | Admitting: Obstetrics & Gynecology

## 2014-03-02 DIAGNOSIS — Z853 Personal history of malignant neoplasm of breast: Secondary | ICD-10-CM

## 2014-03-08 ENCOUNTER — Ambulatory Visit (INDEPENDENT_AMBULATORY_CARE_PROVIDER_SITE_OTHER): Payer: BC Managed Care – PPO | Admitting: Family Medicine

## 2014-03-08 ENCOUNTER — Encounter: Payer: Self-pay | Admitting: Family Medicine

## 2014-03-08 VITALS — BP 126/78 | HR 77 | Temp 98.3°F | Ht 65.0 in | Wt 173.5 lb

## 2014-03-08 DIAGNOSIS — J069 Acute upper respiratory infection, unspecified: Secondary | ICD-10-CM | POA: Insufficient documentation

## 2014-03-08 DIAGNOSIS — R635 Abnormal weight gain: Secondary | ICD-10-CM

## 2014-03-08 DIAGNOSIS — F329 Major depressive disorder, single episode, unspecified: Secondary | ICD-10-CM

## 2014-03-08 DIAGNOSIS — E785 Hyperlipidemia, unspecified: Secondary | ICD-10-CM

## 2014-03-08 DIAGNOSIS — R5381 Other malaise: Secondary | ICD-10-CM | POA: Insufficient documentation

## 2014-03-08 DIAGNOSIS — K589 Irritable bowel syndrome without diarrhea: Secondary | ICD-10-CM

## 2014-03-08 DIAGNOSIS — F32A Depression, unspecified: Secondary | ICD-10-CM

## 2014-03-08 DIAGNOSIS — F3289 Other specified depressive episodes: Secondary | ICD-10-CM

## 2014-03-08 DIAGNOSIS — Z833 Family history of diabetes mellitus: Secondary | ICD-10-CM

## 2014-03-08 DIAGNOSIS — R5383 Other fatigue: Secondary | ICD-10-CM

## 2014-03-08 LAB — CBC WITH DIFFERENTIAL/PLATELET
BASOS PCT: 0.6 % (ref 0.0–3.0)
Basophils Absolute: 0 10*3/uL (ref 0.0–0.1)
EOS PCT: 3.3 % (ref 0.0–5.0)
Eosinophils Absolute: 0.2 10*3/uL (ref 0.0–0.7)
HCT: 38.3 % (ref 36.0–46.0)
HEMOGLOBIN: 12.6 g/dL (ref 12.0–15.0)
LYMPHS PCT: 21.4 % (ref 12.0–46.0)
Lymphs Abs: 1.1 10*3/uL (ref 0.7–4.0)
MCHC: 33 g/dL (ref 30.0–36.0)
MCV: 85.7 fl (ref 78.0–100.0)
MONOS PCT: 7.4 % (ref 3.0–12.0)
Monocytes Absolute: 0.4 10*3/uL (ref 0.1–1.0)
NEUTROS ABS: 3.4 10*3/uL (ref 1.4–7.7)
Neutrophils Relative %: 67.3 % (ref 43.0–77.0)
Platelets: 244 10*3/uL (ref 150.0–400.0)
RBC: 4.46 Mil/uL (ref 3.87–5.11)
RDW: 13.9 % (ref 11.5–15.5)
WBC: 5 10*3/uL (ref 4.0–10.5)

## 2014-03-08 LAB — VITAMIN B12: VITAMIN B 12: 320 pg/mL (ref 211–911)

## 2014-03-08 LAB — TSH: TSH: 2.36 u[IU]/mL (ref 0.35–4.50)

## 2014-03-08 LAB — HEMOGLOBIN A1C: HEMOGLOBIN A1C: 6.4 % (ref 4.6–6.5)

## 2014-03-08 LAB — VITAMIN D 25 HYDROXY (VIT D DEFICIENCY, FRACTURES): VITD: 30.28 ng/mL

## 2014-03-08 LAB — T4, FREE: Free T4: 0.67 ng/dL (ref 0.60–1.60)

## 2014-03-08 MED ORDER — LINACLOTIDE 145 MCG PO CAPS: 145.0000 ug | ORAL_CAPSULE | Freq: Every day | ORAL | Status: DC

## 2014-03-08 MED ORDER — CITALOPRAM HYDROBROMIDE 20 MG PO TABS: 20.0000 mg | ORAL_TABLET | Freq: Every day | ORAL | Status: DC

## 2014-03-08 MED ORDER — ROSUVASTATIN CALCIUM 5 MG PO TABS: 5.0000 mg | ORAL_TABLET | Freq: Every day | ORAL | Status: DC

## 2014-03-08 NOTE — Assessment & Plan Note (Signed)
Restart Crestor. Recheck lipids and CMET in 8 weeks.

## 2014-03-08 NOTE — Progress Notes (Signed)
Pre visit review using our clinic review tool, if applicable. No additional management support is needed unless otherwise documented below in the visit note. 

## 2014-03-08 NOTE — Patient Instructions (Addendum)
Good to see you. Please schedule a follow up with Dr. Lorelei Pont.

## 2014-03-08 NOTE — Assessment & Plan Note (Signed)
Likely viral. Advised continued supportive care- lozenges, warm tea with honey, etc. Call or return to clinic prn if these symptoms worsen or fail to improve as anticipated. The patient indicates understanding of these issues and agrees with the plan.

## 2014-03-08 NOTE — Assessment & Plan Note (Signed)
Stable on current rx, refilled today.

## 2014-03-08 NOTE — Progress Notes (Signed)
Subjective:    Patient ID: [name], female    DOB: 1956-05-29, 58 y.o.   MRN: [id number]  HPI  58 yo here for 6 month follow up.  Saw Webb Silversmith for CPX in 08/2013 while I was on maternity leave.  Has URI symptoms for four 4 days.  Sore throat, sinus congestion, dry cough. + chills, no fevers.  Non smoker.  HLD-  Started on Crestor in 08/2013.  Has not had it since 09/2013 because of insurance.  Now she has insurance again and will pick it up.  Lipids have not been rechecked since 08/2013. Lab Results  Component Value Date   CHOL 277* 08/31/2013   HDL 56.10 08/31/2013   LDLCALC 176* 12/31/2011   LDLDIRECT 208.2 08/31/2013   TRIG 141.0 08/31/2013   CHOLHDL 5 08/31/2013   She is concerned about weight gained. Has been walking more and trying to eat right.  She is concerned about her thyroid because mom has hypothyroidism.  Has seen a nutritionist last year. Wt Readings from Last 3 Encounters:  03/08/14 173 lb 8 oz (78.699 kg)  08/31/13 165 lb 12 oz (75.184 kg)  07/30/13 160 lb (72.576 kg)    H/o DCIS in 2008- s/p tamoxifen. 5 year mammogram is next week!  Anxiety- much better since starting celexa last summer. Denies any anxiety or depression.   Patient Active Problem List   Diagnosis Date Noted  . Stress incontinence 08/31/2013  . Cervico-occipital neuralgia 06/01/2013  . IBS (irritable bowel syndrome) 03/30/2013  . HLD (hyperlipidemia) 03/09/2012  . Migraine 12/31/2011  . Anxiety 12/31/2011  . Depression 12/31/2011  . Insomnia 12/31/2011  . Breast cancer 12/31/2011   Past Medical History  Diagnosis Date  . Cancer 02/27/2007    Breast Cancer  . Migraine    Past Surgical History  Procedure Laterality Date  . Cesarean section      two times  . Abdominal hysterectomy    . Breast surgery  01/14/2013    breast reduction left side  . Foot surgery Right    History  Substance Use Topics  . Smoking status: Never Smoker   . Smokeless tobacco: Never Used   . Alcohol Use: No   Family History  Problem Relation Age of Onset  . Thyroid disease Mother   . Hypertension Mother   . Hyperlipidemia Mother   . Alzheimer's disease Mother   . Cancer Father     throat cancer  . Hyperlipidemia Father   . Diabetes Sister   . Alzheimer's disease Maternal Aunt   . Alzheimer's disease Maternal Uncle   . Diabetes Paternal Uncle   . Alzheimer's disease Maternal Grandmother   . Diabetes Paternal Grandmother    No Known Allergies Current Outpatient Prescriptions on File Prior to Visit  Medication Sig Dispense Refill  . HYDROcodone-acetaminophen (NORCO) 10-325 MG per tablet Take 1 tablet by mouth every 8 (eight) hours as needed for pain.  30 tablet  0  . ibuprofen (ADVIL,MOTRIN) 800 MG tablet TAKE ONE TABLET BY MOUTH EVERY 8 HOURS AS NEEDED FOR RELIEF FROM PAIN  60 tablet  0  . traMADol (ULTRAM) 50 MG tablet Take 1 tablet (50 mg total) by mouth every 6 (six) hours as needed for pain.  30 tablet  0  . zolmitriptan (ZOMIG) 5 MG nasal solution Place 1 spray into the nose as needed for migraine.  6 Units  11   No current facility-administered medications on file prior to visit.   The PMH,  PSH, Social History, Family History, Medications, and allergies have been reviewed in [facility], and have been updated if relevant.   Review of Systems See HPI Denies any cold or heat intolerance Cannot tell if her bowel habits have changed- has IBS Denies palpitations   +fatigue Objective:   Physical Exam BP 126/78  Pulse 77  Temp(Src) 98.3 F (36.8 C) (Oral)  Ht 5\' 5"  (1.651 m)  Wt 173 lb 8 oz (78.699 kg)  BMI 28.87 kg/m2  SpO2 97%  General:  Well-developed,well-nourished,in no acute distress; alert,appropriate and cooperative throughout examination Head:  normocephalic and atraumatic.   No thyromegaly +PND Eyes:  vision grossly intact, pupils equal, pupils round, and pupils reactive to light.   Ears:  R ear normal and L ear normal.   Nose:  no external  deformity.   Lungs:  Normal respiratory effort, chest expands symmetrically. Lungs are clear to auscultation, no crackles or wheezes. Heart:  Normal rate and regular rhythm. S1 and S2 normal without gallop, murmur, click, rub or other extra sounds. Msk:  No deformity or scoliosis noted of thoracic or lumbar spine.   Extremities:  No clubbing, cyanosis, edema, or deformity noted with normal full range of motion of all joints.   Neurologic:  alert & oriented X3 and gait normal.   Skin:  Intact without suspicious lesions or rashes Cervical Nodes:  No lymphadenopathy noted Psych:  Cognition and judgment appear intact. Alert and cooperative with normal attention span and concentration. No apparent delusions, illusions, hallucinations     Assessment & Plan:

## 2014-03-08 NOTE — Assessment & Plan Note (Signed)
Check labs today. Orders Placed This Encounter  Procedures  . Comprehensive metabolic panel  . Lipid panel  . TSH  . T4, Free  . Hemoglobin A1c  . Vitamin B12  . Vitamin D, 25-hydroxy  . CBC with Differential

## 2014-03-10 ENCOUNTER — Encounter: Payer: Self-pay | Admitting: Family Medicine

## 2014-03-10 ENCOUNTER — Other Ambulatory Visit (INDEPENDENT_AMBULATORY_CARE_PROVIDER_SITE_OTHER): Payer: BC Managed Care – PPO

## 2014-03-10 DIAGNOSIS — E785 Hyperlipidemia, unspecified: Secondary | ICD-10-CM

## 2014-03-10 LAB — COMPREHENSIVE METABOLIC PANEL
ALBUMIN: 4.4 g/dL (ref 3.5–5.2)
ALT: 24 U/L (ref 0–35)
AST: 33 U/L (ref 0–37)
Alkaline Phosphatase: 69 U/L (ref 39–117)
BUN: 15 mg/dL (ref 6–23)
CALCIUM: 9.2 mg/dL (ref 8.4–10.5)
CHLORIDE: 103 meq/L (ref 96–112)
CO2: 27 meq/L (ref 19–32)
CREATININE: 0.8 mg/dL (ref 0.4–1.2)
GFR: 83.21 mL/min (ref >60.00)
Glucose, Bld: 93 mg/dL (ref 70–99)
POTASSIUM: 4.2 meq/L (ref 3.5–5.1)
Sodium: 140 mEq/L (ref 135–145)
Total Bilirubin: 0.3 mg/dL (ref 0.2–1.2)
Total Protein: 7.8 g/dL (ref 6.0–8.3)

## 2014-03-10 LAB — LIPID PANEL
Cholesterol: 285 mg/dL — ABNORMAL HIGH (ref 0–200)
HDL: 46.3 mg/dL (ref >39.00)
LDL Cholesterol: 178 mg/dL — ABNORMAL HIGH (ref 0–99)
NONHDL: 238.7
Total CHOL/HDL Ratio: 6
Triglycerides: 303 mg/dL — ABNORMAL HIGH (ref 0.0–149.0)
VLDL: 60.6 mg/dL — ABNORMAL HIGH (ref 0.0–40.0)

## 2014-03-14 ENCOUNTER — Ambulatory Visit (INDEPENDENT_AMBULATORY_CARE_PROVIDER_SITE_OTHER)
Admission: RE | Admit: 2014-03-14 | Discharge: 2014-03-14 | Disposition: A | Discharge status: Home / Self Care | Payer: BC Managed Care – PPO | Source: Ambulatory Visit | Attending: Family Medicine | Admitting: Family Medicine

## 2014-03-14 ENCOUNTER — Encounter: Payer: Self-pay | Admitting: Family Medicine

## 2014-03-14 ENCOUNTER — Ambulatory Visit (INDEPENDENT_AMBULATORY_CARE_PROVIDER_SITE_OTHER): Payer: BC Managed Care – PPO | Admitting: Family Medicine

## 2014-03-14 VITALS — BP 120/80 | HR 75 | Temp 98.4°F | Ht 65.0 in | Wt 176.0 lb

## 2014-03-14 DIAGNOSIS — M25561 Pain in right knee: Secondary | ICD-10-CM

## 2014-03-14 DIAGNOSIS — M25569 Pain in unspecified knee: Secondary | ICD-10-CM

## 2014-03-14 DIAGNOSIS — IMO0002 Reserved for concepts with insufficient information to code with codable children: Secondary | ICD-10-CM

## 2014-03-14 DIAGNOSIS — M7050 Other bursitis of knee, unspecified knee: Secondary | ICD-10-CM

## 2014-03-14 MED ORDER — TRAMADOL HCL 50 MG PO TABS: 50.0000 mg | ORAL_TABLET | Freq: Four times a day (QID) | ORAL | Status: DC | PRN

## 2014-03-14 MED ORDER — TRAMADOL HCL 50 MG PO TABS
50.0000 mg | ORAL_TABLET | Freq: Four times a day (QID) | ORAL | Status: DC | PRN
Start: 2014-03-14 — End: 2014-06-15

## 2014-03-14 NOTE — Progress Notes (Signed)
[name] [address] Phone: [phone] Fax: [phone]  Patient ID: [name] MRN: [id number], DOB: [birth year], 58 y.o. Date of Encounter: 03/14/2014  Primary Physician:  Arnette Norris, [name]   Chief Complaint: Edema   Subjective:   Dear Dr. Deborra Medina,  Thank you for having me see [name] in consultation today at [facility] at [facility] for her problem with bilalteral knee pain.  As you may recall, she is a 58 y.o. year old female with a history of breast cancer, but she has been having some pain in her knees bilaterally over the last few months, and this is localized more in the RIGHT knee. She primarily has some anterior knee pain, and she has swelling just distal to the knee. She has not had any specific injury or trauma that could have hurt her knees. She is not having any mechanical locking up of her knees, no symptomatic giving way.  She is having a little bit of some lower extremity edema, and she just saw vascular surgery. They did some type of vascular evaluation and it sounds as if they did an ultrasound, and the patient does not have any deep vein thrombosis. Dr. Henrine Screws notes should be coming to your attention soon.   She has had some anterior catching and popping that she notices when she rises from a seated position when she goes up and down stairs.   Past Medical History  Diagnosis Date  . Cancer 02/27/2007    Breast Cancer  . Migraine     Past Surgical History  Procedure Laterality Date  . Cesarean section      two times  . Abdominal hysterectomy    . Breast surgery  01/14/2013    breast reduction left side  . Foot surgery Right     History   Social History  . Marital Status: Married    Spouse Name: N/A    Number of Children: N/A  . Years of Education: N/A   Social History Main Topics  . Smoking status: Never Smoker   . Smokeless tobacco: Never Used  . Alcohol Use: No  . Drug Use: No  . Sexual Activity: Yes   Partners: Male    Birth Control/ Protection: Surgical   Other Topics Concern  . None   Social History Narrative  . None    Family History  Problem Relation Age of Onset  . Thyroid disease Mother   . Hypertension Mother   . Hyperlipidemia Mother   . Alzheimer's disease Mother   . Cancer Father     throat cancer  . Hyperlipidemia Father   . Diabetes Sister   . Alzheimer's disease Maternal Aunt   . Alzheimer's disease Maternal Uncle   . Diabetes Paternal Uncle   . Alzheimer's disease Maternal Grandmother   . Diabetes Paternal Grandmother     Medications and Allergies reviewed  Review of Systems:    GEN: No fevers, chills. Nontoxic. Primarily MSK c/o today. MSK: Detailed in the HPI. Left shoulder pain GI: tolerating PO intake without difficulty Neuro: No numbness, parasthesias, or tingling associated. Otherwise, the pertinent positives and negatives are listed above and in the HPI, otherwise a full review of systems has been reviewed and is negative unless noted positive.   Objective:   Physical Examination: BP 120/80  Pulse 75  Temp(Src) 98.4 F (36.9 C) (Oral)  Ht 5\' 5"  (1.651 m)  Wt 176 lb (79.833 kg)  BMI 29.29 kg/m2  GEN: WDWN, NAD, Non-toxic, Alert & Oriented x 3 HEENT: Atraumatic, Normocephalic.  Ears and Nose: No external deformity. EXTR: No clubbing/cyanosis/edema NEURO: Normal gait.  PSYCH: Normally interactive. Conversant. Not depressed or anxious appearing.  Calm demeanor.   Knee:  BILATERAL Gait: Normal heel toe pattern ROM: 0-125 B Effusion: neg Echymosis or edema: none PES BURSA BILATERALLY IS SWOLLEN AND TENDER TO PALPATION Patellar tendon NT Painful PLICA: neg Patellar grind: minimal positive Medial and lateral patellar facet loading: mild pos medial and lateral joint lines: on R medial joint line ttp Mcmurray's neg Flexion-pinch mild pain Varus and valgus stress: stable Lachman: neg Ant and Post drawer: neg Hip abduction, IR, ER:  WNL Hip flexion str: 5/5 Hip abd: 5/5 Quad: 5/5 VMO atrophy:No Hamstring concentric and eccentric: 5/5   Radiology:  Dg Knee Ap/lat W/sunrise Right  03/14/2014   CLINICAL DATA:  Right knee pain.  EXAM: DG KNEE - 3 VIEWS  COMPARISON:  None.  FINDINGS: The joint spaces are maintained. No acute fracture is identified. No osteochondral lesion. No joint effusion.  IMPRESSION: No acute bony findings or joint effusion.   Electronically Signed   By: Kalman Jewels M.D.   On: 03/14/2014 09:11   The radiological images were independently reviewed by myself in the office and results were reviewed with the patient. My independent interpretation of images:  Agreed that the RIGHT weight-bearing knee series is grossly unremarkable. There is no significant osteoarthritic changes are present. There are no occult fractures. The patella is located. Electronically Signed  By: [name], [name] On: 03/15/2014 9:47 AM    Assessment & Plan:   1. Bilateral R > L Pes anserine bursitis 2. Probable PFS vs chondromalacia patella B 3. X-rays grossly normal. Clinically likely some minimal OA is present at 57 and by exam  Recommendations: 1. Continue Motrin 800 mg TID prn, tramadol prn. 2. Ice knee over the next few days. 3. Pes bursa and intraarticular injection on the R. 4. Also has shoulder / impingement complaints, and I will recheck in 3 weeks this and knees.  Pes Anserine Bursitis Injection, RIGHT Verbal consent was obtained. Risks (including rare infection, skin lightening, and potential atrophy), benefits, and alternatives explained. Sterilely prepped with Chloraprep. Ethyl chloride for anesthesia. Under sterile conditions, 2 cc of Lidocaine 1% and 1 cc of Depo-Medrol 40 mg injected directly on the pes anserinus perpendicularly taking the needle to bone then slightly withdrawing. No resistance encountered. No complications with procedure and tolerated well. Patient had decreased pain post-injection. 22 gauge  1 1/2 inch needle   Knee Injection, RIGHT Patient verbally consented to procedure. Risks (including potential rare risk of infection), benefits, and alternatives explained. Sterilely prepped with Chloraprep. Ethyl cholride used for anesthesia. 8 cc Lidocaine 1% mixed with Depo-Medrol 80 mg injected using the anteromedial approach without difficulty. No complications with procedure and tolerated well. Patient had decreased pain post-injection.   New Prescriptions   No medications on file   Modified Medications   Modified Medication Previous Medication   TRAMADOL (ULTRAM) 50 MG TABLET traMADol (ULTRAM) 50 MG tablet      Take 1 tablet (50 mg total) by mouth every 6 (six) hours as needed.    Take 1 tablet (50 mg total) by mouth every 6 (six) hours as needed for pain.   Orders Placed This Encounter  Procedures  . DG Knee AP/LAT W/Sunrise Right    We will see the patient back in Return in about 3 weeks (around 04/04/2014) for Left  shoulder eval and f/u knee .[name] If not noted, then follow-up as needed.   Thank you for having [place] see [name] in consultation.  Feel free to contact me with any questions.  Signed,  Signed,  [name]. Copland, [name], CAQ Sports Medicine   Discontinued Medications   HYDROCODONE-ACETAMINOPHEN (NORCO) 10-325 MG PER TABLET    Take 1 tablet by mouth every 8 (eight) hours as needed for pain.   ZOLMITRIPTAN (ZOMIG) 5 MG NASAL SOLUTION    Place 1 spray into the nose as needed for migraine.   Current Medications at Discharge:   Medication List       This list is accurate as of: 03/14/14 11:59 PM.  Always use your most recent med list.               citalopram 20 MG tablet  Commonly known as:  CELEXA  Take 1 tablet (20 mg total) by mouth daily.     ibuprofen 800 MG tablet  Commonly known as:  ADVIL,MOTRIN  TAKE ONE TABLET BY MOUTH EVERY 8 HOURS AS NEEDED FOR RELIEF FROM PAIN     Linaclotide 145 MCG Caps capsule  Commonly known as:  LINZESS  Take 1  capsule (145 mcg total) by mouth daily.     rosuvastatin 5 MG tablet  Commonly known as:  CRESTOR  Take 1 tablet (5 mg total) by mouth daily.     traMADol 50 MG tablet  Commonly known as:  ULTRAM  Take 1 tablet (50 mg total) by mouth every 6 (six) hours as needed.     zolmitriptan 5 MG tablet  Commonly known as:  ZOMIG  Take 5 mg by mouth as needed for migraine.

## 2014-04-04 ENCOUNTER — Ambulatory Visit: Payer: BC Managed Care – PPO | Admitting: Family Medicine

## 2014-04-06 ENCOUNTER — Ambulatory Visit: Payer: BC Managed Care – PPO

## 2014-04-11 ENCOUNTER — Ambulatory Visit (INDEPENDENT_AMBULATORY_CARE_PROVIDER_SITE_OTHER): Payer: BC Managed Care – PPO | Admitting: Family Medicine

## 2014-04-11 ENCOUNTER — Ambulatory Visit: Payer: BC Managed Care – PPO | Admitting: Family Medicine

## 2014-04-11 ENCOUNTER — Encounter: Payer: Self-pay | Admitting: Family Medicine

## 2014-04-11 VITALS — BP 120/80 | HR 88 | Temp 98.2°F | Ht 65.0 in | Wt 174.0 lb

## 2014-04-11 DIAGNOSIS — M7050 Other bursitis of knee, unspecified knee: Secondary | ICD-10-CM

## 2014-04-11 DIAGNOSIS — M25569 Pain in unspecified knee: Secondary | ICD-10-CM

## 2014-04-11 DIAGNOSIS — E538 Deficiency of other specified B group vitamins: Secondary | ICD-10-CM | POA: Insufficient documentation

## 2014-04-11 DIAGNOSIS — M25561 Pain in right knee: Secondary | ICD-10-CM

## 2014-04-11 DIAGNOSIS — IMO0002 Reserved for concepts with insufficient information to code with codable children: Secondary | ICD-10-CM

## 2014-04-11 MED ORDER — CYANOCOBALAMIN 1000 MCG/ML IJ SOLN
1000.0000 ug | Freq: Once | INTRAMUSCULAR | Status: AC
  Administered 2014-04-11: 1000 ug via INTRAMUSCULAR

## 2014-04-11 NOTE — Progress Notes (Signed)
Pre visit review using our clinic review tool, if applicable. No additional management support is needed unless otherwise documented below in the visit note. 

## 2014-04-11 NOTE — Progress Notes (Signed)
[place] [address] Phone: [phone] Fax: [phone]  Patient ID: [name] MRN: [id number], DOB: Nov 02, 1955, 58 y.o. Date of Encounter: 04/11/2014  Primary Physician:  Arnette Norris, MD   Chief Complaint: Follow-up   Subjective:   F/u R knee pain, pes bursitis and mild oa exac s/p pes bursa inject and knee injection, NSAIDS, and rest. She has done great, R knee feels much better. Mild L knee pain.  Shoulder pain resolved.   Some persistent B LE tr edema  03/14/2014 Last OV with Leah Loffler, MD  Thank you for having me see [name] in consultation today at [facility] at [facility] for her problem with bilalteral knee pain.  As you may recall, she is a 58 y.o. year old female with a history of breast cancer, but she has been having some pain in her knees bilaterally over the last few months, and this is localized more in the RIGHT knee. She primarily has some anterior knee pain, and she has swelling just distal to the knee. She has not had any specific injury or trauma that could have hurt her knees. She is not having any mechanical locking up of her knees, no symptomatic giving way.  She is having a little bit of some lower extremity edema, and she just saw vascular surgery. They did some type of vascular evaluation and it sounds as if they did an ultrasound, and the patient does not have any deep vein thrombosis. Dr. Henrine Screws notes should be coming to your attention soon.   She has had some anterior catching and popping that she notices when she rises from a seated position when she goes up and down stairs.   Past Medical History  Diagnosis Date  . Cancer 02/27/2007    Breast Cancer  . Migraine     Past Surgical History  Procedure Laterality Date  . Cesarean section      two times  . Abdominal hysterectomy    . Breast surgery  01/14/2013    breast reduction left side  . Foot surgery Right     History   Social History  .  Marital Status: Married    Spouse Name: N/A    Number of Children: N/A  . Years of Education: N/A   Social History Main Topics  . Smoking status: Never Smoker   . Smokeless tobacco: Never Used  . Alcohol Use: No  . Drug Use: No  . Sexual Activity: Yes    Partners: Male    Birth Control/ Protection: Surgical   Other Topics Concern  . None   Social History Narrative  . None    Family History  Problem Relation Age of Onset  . Thyroid disease Mother   . Hypertension Mother   . Hyperlipidemia Mother   . Alzheimer's disease Mother   . Cancer Father     throat cancer  . Hyperlipidemia Father   . Diabetes Sister   . Alzheimer's disease Maternal Aunt   . Alzheimer's disease Maternal Uncle   . Diabetes Paternal Uncle   . Alzheimer's disease Maternal Grandmother   . Diabetes Paternal Grandmother     Medications and Allergies reviewed  Review of Systems:    GEN: No fevers, chills. Nontoxic. Primarily MSK c/o today. MSK: Detailed in the HPI. Left shoulder pain GI: tolerating PO intake without difficulty Neuro: No numbness, parasthesias, or tingling associated. Otherwise, the pertinent positives and negatives are listed above and in the  HPI, otherwise a full review of systems has been reviewed and is negative unless noted positive.   Objective:   Physical Examination: BP 120/80  Pulse 88  Temp(Src) 98.2 F (36.8 C) (Oral)  Ht 5\' 5"  (1.651 m)  Wt 174 lb (78.926 kg)  BMI 28.96 kg/m2    GEN: WDWN, NAD, Non-toxic, Alert & Oriented x 3 HEENT: Atraumatic, Normocephalic.  Ears and Nose: No external deformity. EXTR: No clubbing/cyanosis/edema NEURO: Normal gait.  PSYCH: Normally interactive. Conversant. Not depressed or anxious appearing.  Calm demeanor.   Knee:  BILATERAL Gait: Normal heel toe pattern ROM: 0-125 B Effusion: neg Echymosis or edema: none PES BURSA BILATERALLY IS SWOLLEN AND NT Patellar tendon NT Painful PLICA: neg Patellar grind: minimal  positive Medial and lateral patellar facet loading: neg medial and lateral joint lines: on R medial joint line ttp, mild on L Mcmurray's neg Flexion-pinch mild pain Varus and valgus stress: stable Lachman: neg Ant and Post drawer: neg Hip abduction, IR, ER: WNL Hip flexion str: 5/5 Hip abd: 5/5 Quad: 5/5 VMO atrophy:No Hamstring concentric and eccentric: 5/5   Radiology:  Dg Knee Ap/lat W/sunrise Right  03/14/2014   CLINICAL DATA:  Right knee pain.  EXAM: DG KNEE - 3 VIEWS  COMPARISON:  None.  FINDINGS: The joint spaces are maintained. No acute fracture is identified. No osteochondral lesion. No joint effusion.  IMPRESSION: No acute bony findings or joint effusion.   Electronically Signed   By: Kalman Jewels M.D.   On: 03/14/2014 09:11   The radiological images were independently reviewed by myself in the office and results were reviewed with the patient. My independent interpretation of images:  Agreed that the RIGHT weight-bearing knee series is grossly unremarkable. There is no significant osteoarthritic changes are present. There are no occult fractures. The patella is located. Electronically Signed  By: Leah Loffler, MD On: 04/11/2014 10:00 AM    Assessment & Plan:   1. Bilateral R > L Pes anserine bursitis 2. Probable PFS vs chondromalacia patella B 3. X-rays grossly normal. Clinically likely some minimal OA is present at 57 and by exam  Recommendations: 1.Compression stockings 2. Ice knee over the next few days. 3. Exercise. 4. Also has shoulder / impingement complaints: now resolved   We will see the patient back prn  Thank you for having [place] see [name] in consultation.  Feel free to contact me with any questions.  Signed,  Signed,  [name]. Berklie Dethlefs, MD, CAQ Sports Medicine   Discontinued Medications   No medications on file   Current Medications at Discharge:   Medication List       This list is accurate as of: 04/11/14 10:00 AM.  Always use  your most recent med list.               citalopram 20 MG tablet  Commonly known as:  CELEXA  Take 1 tablet (20 mg total) by mouth daily.     ibuprofen 800 MG tablet  Commonly known as:  ADVIL,MOTRIN  TAKE ONE TABLET BY MOUTH EVERY 8 HOURS AS NEEDED FOR RELIEF FROM PAIN     Linaclotide 145 MCG Caps capsule  Commonly known as:  LINZESS  Take 1 capsule (145 mcg total) by mouth daily.     rosuvastatin 5 MG tablet  Commonly known as:  CRESTOR  Take 1 tablet (5 mg total) by mouth daily.     traMADol 50 MG tablet  Commonly known as:  Veatrice Bourbon  Take 1 tablet (50 mg total) by mouth every 6 (six) hours as needed.     zolmitriptan 5 MG tablet  Commonly known as:  ZOMIG  Take 5 mg by mouth as needed for migraine.

## 2014-05-09 ENCOUNTER — Encounter: Payer: Self-pay | Admitting: Family Medicine

## 2014-05-09 ENCOUNTER — Other Ambulatory Visit: Payer: Self-pay | Admitting: Family Medicine

## 2014-05-09 ENCOUNTER — Other Ambulatory Visit (INDEPENDENT_AMBULATORY_CARE_PROVIDER_SITE_OTHER): Payer: BC Managed Care – PPO

## 2014-05-09 DIAGNOSIS — E785 Hyperlipidemia, unspecified: Secondary | ICD-10-CM

## 2014-05-09 LAB — COMPREHENSIVE METABOLIC PANEL
ALK PHOS: 69 U/L (ref 39–117)
ALT: 17 U/L (ref 0–35)
AST: 22 U/L (ref 0–37)
Albumin: 3.8 g/dL (ref 3.5–5.2)
BILIRUBIN TOTAL: 0.5 mg/dL (ref 0.2–1.2)
BUN: 14 mg/dL (ref 6–23)
CO2: 30 mEq/L (ref 19–32)
Calcium: 9.2 mg/dL (ref 8.4–10.5)
Chloride: 102 mEq/L (ref 96–112)
Creatinine, Ser: 0.7 mg/dL (ref 0.4–1.2)
GFR: 92.98 mL/min (ref >60.00)
Glucose, Bld: 91 mg/dL (ref 70–99)
Potassium: 4.3 mEq/L (ref 3.5–5.1)
SODIUM: 141 meq/L (ref 135–145)
TOTAL PROTEIN: 7.1 g/dL (ref 6.0–8.3)

## 2014-05-09 LAB — LIPID PANEL
Cholesterol: 238 mg/dL — ABNORMAL HIGH (ref 0–200)
HDL: 57.1 mg/dL (ref >39.00)
LDL Cholesterol: 145 mg/dL — ABNORMAL HIGH (ref 0–99)
NONHDL: 180.9
Total CHOL/HDL Ratio: 4
Triglycerides: 179 mg/dL — ABNORMAL HIGH (ref 0.0–149.0)
VLDL: 35.8 mg/dL (ref 0.0–40.0)

## 2014-05-10 ENCOUNTER — Ambulatory Visit (INDEPENDENT_AMBULATORY_CARE_PROVIDER_SITE_OTHER): Payer: BC Managed Care – PPO | Admitting: Nurse Practitioner

## 2014-05-10 ENCOUNTER — Encounter: Payer: Self-pay | Admitting: Nurse Practitioner

## 2014-05-10 VITALS — BP 123/80 | HR 81 | Ht 64.0 in | Wt 177.2 lb

## 2014-05-10 DIAGNOSIS — Z01419 Encounter for gynecological examination (general) (routine) without abnormal findings: Secondary | ICD-10-CM | POA: Diagnosis not present

## 2014-05-10 DIAGNOSIS — R319 Hematuria, unspecified: Secondary | ICD-10-CM

## 2014-05-10 DIAGNOSIS — Z124 Encounter for screening for malignant neoplasm of cervix: Secondary | ICD-10-CM

## 2014-05-10 DIAGNOSIS — C50919 Malignant neoplasm of unspecified site of unspecified female breast: Secondary | ICD-10-CM

## 2014-05-10 DIAGNOSIS — R300 Dysuria: Secondary | ICD-10-CM | POA: Diagnosis not present

## 2014-05-10 DIAGNOSIS — N390 Urinary tract infection, site not specified: Secondary | ICD-10-CM | POA: Diagnosis not present

## 2014-05-10 DIAGNOSIS — Z1151 Encounter for screening for human papillomavirus (HPV): Secondary | ICD-10-CM

## 2014-05-10 DIAGNOSIS — G44229 Chronic tension-type headache, not intractable: Secondary | ICD-10-CM

## 2014-05-10 DIAGNOSIS — C50911 Malignant neoplasm of unspecified site of right female breast: Secondary | ICD-10-CM

## 2014-05-10 DIAGNOSIS — R609 Edema, unspecified: Secondary | ICD-10-CM

## 2014-05-10 LAB — POCT URINALYSIS DIPSTICK
BILIRUBIN UA: NEGATIVE
Glucose, UA: NEGATIVE
KETONES UA: NEGATIVE
LEUKOCYTES UA: NEGATIVE
Nitrite, UA: NEGATIVE
Protein, UA: NEGATIVE
Spec Grav, UA: 1.015
Urobilinogen, UA: NEGATIVE
pH, UA: 6

## 2014-05-10 MED ORDER — PROMETHAZINE HCL 25 MG PO TABS: 25.0000 mg | ORAL_TABLET | Freq: Four times a day (QID) | ORAL | Status: DC | PRN

## 2014-05-10 NOTE — Progress Notes (Signed)
History:  [name] is a 58 y.o. G2P2002 who presents to [facility] clinic today for yearly well woman exam. She is experiencing several issues including edema in her legs up to her knees, weight gain, indigestion, and uti sx. She has a history of elevated cholesterol and has been on crestor until her insurance changed and the price became prohibitive and she switched to red yeast rice. She was seen recently by Dr Deborra Medina and all of her blood work was negative though there is no EKG. She is doing very well with her migraines but would like phenergan for occasional nausea.  The following portions of the patient's history were reviewed and updated as appropriate: allergies, current medications, past family history, past medical history, past social history, past surgical history and problem list.  Review of Systems:    Objective:  Physical Exam BP 123/80  Pulse 81  Ht 5\' 4"  (1.626 m)  Wt 177 lb 3.2 oz (80.377 kg)  BMI 30.40 kg/m2 GENERAL: Well-developed, well-nourished female in no acute distress.  HEENT: Normocephalic, atraumatic.  NECK: Supple. Normal thyroid.  LUNGS: Normal rate. Clear to auscultation bilaterally.  HEART: Regular rate and rhythm with no adventitious sounds.  BREASTS: Symmetric in size. No masses, skin changes, nipple drainage, or lymphadenopathy. S/P breast surgery and reduction scars ABDOMEN: Soft, nontender, nondistended. No organomegaly. Normal bowel sounds appreciated in all quadrants.  PELVIC: Normal external female genitalia. Vagina is pink and rugated.  Normal discharge.  Pap smear obtained.  No adnexal mass or tenderness.  EXTREMITIES: No cyanosis, clubbing, 2+ distal pulses. There is +2 pitting edema to her knees   Labs and Imaging No results found. Results for orders placed in visit on 05/10/14 (from the past 24 hour(s))  POCT URINALYSIS DIPSTICK     Status: Abnormal   Collection Time    05/10/14 10:31 AM      Result Value Ref Range   Color, UA yellow      Clarity, UA clear     Glucose, UA negative     Bilirubin, UA negative     Ketones, UA negative     Spec Grav, UA 1.015     Blood, UA trace     pH, UA 6.0     Protein, UA negative     Urobilinogen, UA negative     Nitrite, UA negative     Leukocytes, UA Negative      Assessment & Plan:  Assessment:  Well Woman Exam Peripheral Edema Weight gain ? UTI Migraines   Plans:  Considering edema, weight gain, indigestion and elevated cholesterol patient is advised to make an appointment to see Dr Deborra Medina asap for cardiac evaluation including EKG Will culture urine Phenergan 25 mg po prn If cardiac evaluation is negative patient would like to resume [place], NP 05/10/2014 11:39 AM

## 2014-05-10 NOTE — Addendum Note (Signed)
Addended by: Erik Obey on: 05/10/2014 01:23 PM   Modules accepted: Orders

## 2014-05-10 NOTE — Patient Instructions (Signed)

## 2014-05-11 ENCOUNTER — Encounter: Payer: Self-pay | Admitting: Family Medicine

## 2014-05-11 ENCOUNTER — Ambulatory Visit (INDEPENDENT_AMBULATORY_CARE_PROVIDER_SITE_OTHER): Payer: BC Managed Care – PPO | Admitting: Family Medicine

## 2014-05-11 VITALS — BP 114/72 | HR 75 | Temp 98.0°F | Ht 64.5 in | Wt 176.0 lb

## 2014-05-11 DIAGNOSIS — M7989 Other specified soft tissue disorders: Secondary | ICD-10-CM

## 2014-05-11 DIAGNOSIS — R609 Edema, unspecified: Secondary | ICD-10-CM

## 2014-05-11 DIAGNOSIS — R600 Localized edema: Secondary | ICD-10-CM | POA: Insufficient documentation

## 2014-05-11 DIAGNOSIS — Z23 Encounter for immunization: Secondary | ICD-10-CM

## 2014-05-11 DIAGNOSIS — E538 Deficiency of other specified B group vitamins: Secondary | ICD-10-CM

## 2014-05-11 LAB — BRAIN NATRIURETIC PEPTIDE: Pro B Natriuretic peptide (BNP): 17 pg/mL (ref 0.0–100.0)

## 2014-05-11 LAB — URINE CULTURE
Colony Count: NO GROWTH
Organism ID, Bacteria: NO GROWTH

## 2014-05-11 MED ORDER — HYDROCHLOROTHIAZIDE 12.5 MG PO CAPS: ORAL_CAPSULE | ORAL | Status: DC

## 2014-05-11 MED ORDER — CYANOCOBALAMIN 1000 MCG/ML IJ SOLN
1000.0000 ug | Freq: Once | INTRAMUSCULAR | Status: AC
  Administered 2014-05-11: 1000 ug via INTRAMUSCULAR

## 2014-05-11 NOTE — Patient Instructions (Addendum)
Great to see you. Your EKG looks great. We will call you with your lab results from today.  Ok to try compression use. Only use HCTZ for severe swelling.

## 2014-05-11 NOTE — Assessment & Plan Note (Signed)
New-  Only trace edema today Likely dependent. EKG NSR but I explained to her that this is not gold standard for ruling out CHF --although I have very low suspicion for her. Advised compression hose. Check BNP today for further reassurance. Low dose HCTZ prn- discussed potential for hypotension.

## 2014-05-11 NOTE — Progress Notes (Signed)
Pre visit review using our clinic review tool, if applicable. No additional management support is needed unless otherwise documented below in the visit note. 

## 2014-05-11 NOTE — Progress Notes (Signed)
Subjective:   Patient ID: [name], female    DOB: 1955/12/28, 58 y.o.   MRN: [id number]  JULIO STORR is a pleasant 58 y.o. year old female who presents to clinic today with Leg Swelling  on 05/11/2014  HPI: Sent her from GYN- saw [employer] yesterday- she advised her to come see me today "for EKG."  Has been having bilateral lower extremity edema on and off for months.  When she wakes up in am, it has resolved.   No CP or SOB.  No recent injury.  Does have OA of knees.  No new rx.   Lab Results  Component Value Date   TSH 2.36 03/08/2014   Lab Results  Component Value Date   WBC 5.0 03/08/2014   HGB 12.6 03/08/2014   HCT 38.3 03/08/2014   MCV 85.7 03/08/2014   PLT 244.0 03/08/2014   Current Outpatient Prescriptions on File Prior to Visit  Medication Sig Dispense Refill  . citalopram (CELEXA) 20 MG tablet Take 1 tablet (20 mg total) by mouth daily.  30 tablet  5  . ibuprofen (ADVIL,MOTRIN) 800 MG tablet TAKE ONE TABLET BY MOUTH EVERY 8 HOURS AS NEEDED FOR RELIEF FROM PAIN  60 tablet  0  . Linaclotide (LINZESS) 145 MCG CAPS capsule Take 1 capsule (145 mcg total) by mouth daily.  30 capsule  3  . Multiple Vitamin (MULTIVITAMIN) tablet Take 1 tablet by mouth daily.      . nitrofurantoin, macrocrystal-monohydrate, (MACROBID) 100 MG capsule       . promethazine (PHENERGAN) 25 MG tablet Take 1 tablet (25 mg total) by mouth every 6 (six) hours as needed for nausea or vomiting.  30 tablet  1  . Red Yeast Rice Extract 500 MG/0.5GM POWD Take by mouth.      . traMADol (ULTRAM) 50 MG tablet Take 1 tablet (50 mg total) by mouth every 6 (six) hours as needed.  40 tablet  3  . zolmitriptan (ZOMIG) 5 MG tablet Take 5 mg by mouth as needed for migraine.      [name] zolpidem (AMBIEN) 10 MG tablet Take 1 tablet (10 mg total) by mouth at bedtime as needed for sleep.  30 tablet  3   No current facility-administered medications on file prior to visit.    No Known Allergies  Past Medical  History  Diagnosis Date  . Cancer 02/27/2007    Breast Cancer  . Migraine   . Hypercholesterolemia     Past Surgical History  Procedure Laterality Date  . Cesarean section      two times  . Abdominal hysterectomy    . Breast surgery  01/14/2013    breast reduction left side  . Foot surgery Right     Family History  Problem Relation Age of Onset  . Thyroid disease Mother   . Hypertension Mother   . Hyperlipidemia Mother   . Alzheimer's disease Mother   . Cancer Father     throat cancer  . Hyperlipidemia Father   . Diabetes Sister   . Alzheimer's disease Maternal Aunt   . Alzheimer's disease Maternal Uncle   . Diabetes Paternal Uncle   . Alzheimer's disease Maternal Grandmother   . Diabetes Paternal Grandmother     History   Social History  . Marital Status: Married    Spouse Name: N/A    Number of Children: N/A  . Years of Education: N/A   Occupational History  . Not on file.  Social History Main Topics  . Smoking status: Never Smoker   . Smokeless tobacco: Never Used  . Alcohol Use: No  . Drug Use: No  . Sexual Activity: Yes    Partners: Male    Birth Control/ Protection: Surgical   Other Topics Concern  . Not on file   Social History Narrative  . No narrative on file   The PMH, PSH, Social History, Family History, Medications, and allergies have been reviewed in [facility], and have been updated if relevant.   Review of Systems See HPI No blurred vision No HA No orthopnea No CP No SOB    Objective:    BP 114/72  Pulse 75  Temp(Src) 98 F (36.7 C) (Oral)  Ht 5' 4.5" (1.638 m)  Wt 176 lb (79.833 kg)  BMI 29.75 kg/m2  SpO2 95%   Physical Exam  GENERAL: Well-developed, well-nourished female in no acute distress.  HEENT: Normocephalic, atraumatic.  NECK: Supple. Normal thyroid.  LUNGS: Normal rate. Clear to auscultation bilaterally.  HEART: Regular rate and rhythm with no adventitious sounds.  ABDOMEN: Soft, nontender, nondistended. No  organomegaly. Normal bowel sounds appreciated in all quadrants.  EXTREMITIES: No cyanosis, clubbing, 2+ distal pulses. There is trace pitting edema to her knees     Assessment & Plan:   Leg swelling - Plan: EKG 12-Lead, Brain natriuretic peptide  Bilateral leg edema  Vitamin B 12 deficiency  B12 deficiency - Plan: cyanocobalamin ((VITAMIN B-12)) injection 1,000 mcg, CANCELED: Vitamin B12  Need for prophylactic vaccination and inoculation against influenza - Plan: Flu Vaccine QUAD 36+ mos PF IM (Fluarix Quad PF) No Follow-up on file.

## 2014-05-12 ENCOUNTER — Ambulatory Visit: Payer: BC Managed Care – PPO

## 2014-05-12 LAB — CYTOLOGY - PAP

## 2014-05-17 ENCOUNTER — Other Ambulatory Visit: Payer: Self-pay | Admitting: *Deleted

## 2014-05-17 ENCOUNTER — Telehealth: Payer: Self-pay | Admitting: Family Medicine

## 2014-05-17 DIAGNOSIS — G43909 Migraine, unspecified, not intractable, without status migrainosus: Secondary | ICD-10-CM

## 2014-05-17 DIAGNOSIS — R609 Edema, unspecified: Secondary | ICD-10-CM

## 2014-05-17 MED ORDER — IBUPROFEN 800 MG PO TABS: ORAL_TABLET | ORAL | Status: DC

## 2014-05-17 NOTE — Telephone Encounter (Signed)
Referral placed.

## 2014-05-17 NOTE — Telephone Encounter (Signed)
Pt is calling and says she has seen all the others drs for leg problem and swelling but would like a referral to a Cardiologist. Pt is aware you are out of the office today and will return tomorrow. Thank you

## 2014-05-17 NOTE — Telephone Encounter (Signed)
RF authorization for Ibuprofen 800 mg sent to pharmacy.

## 2014-05-26 ENCOUNTER — Encounter: Payer: Self-pay | Admitting: Internal Medicine

## 2014-05-30 ENCOUNTER — Ambulatory Visit (INDEPENDENT_AMBULATORY_CARE_PROVIDER_SITE_OTHER): Payer: BC Managed Care – PPO | Admitting: Cardiovascular Disease

## 2014-05-30 ENCOUNTER — Encounter: Payer: Self-pay | Admitting: Cardiovascular Disease

## 2014-05-30 VITALS — BP 118/88 | HR 80 | Ht 65.5 in | Wt 179.2 lb

## 2014-05-30 DIAGNOSIS — R600 Localized edema: Secondary | ICD-10-CM

## 2014-05-30 DIAGNOSIS — R609 Edema, unspecified: Secondary | ICD-10-CM

## 2014-05-30 NOTE — Progress Notes (Signed)
[name] Date of Birth  03/12/1956       [name] [address], [address], [place], [name]  [address]   [place], [place]  [address] [place]   Fax  [phone]     Fax [phone]  Problem List: 1. Leg edema 2. Dyspnea on exertion 3. Weight gain 4. Hyperlipidemia 5. Hx of ductal carcinoma - clear after 7 years.   History of Present Illness:  [name] is a 58 yo who presents for leg edema.  She also has worsening DOE and weight gain over the past 6-8 months.   She just started going to the [facility].  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    Works part time Education administrator houses.    She was seen by her medical doctor - [name] was 17.   She nas no CP when she works out.    Current Outpatient Prescriptions on File Prior to Visit  Medication Sig Dispense Refill  . citalopram (CELEXA) 20 MG tablet Take 1 tablet (20 mg total) by mouth daily.  30 tablet  5  . hydrochlorothiazide (MICROZIDE) 12.5 MG capsule 1 tab by mouth daily prn swelling  30 capsule  1  . ibuprofen (ADVIL,MOTRIN) 800 MG tablet TAKE ONE TABLET BY MOUTH EVERY 8 HOURS AS NEEDED FOR RELIEF FROM PAIN  60 tablet  0  . Linaclotide (LINZESS) 145 MCG CAPS capsule Take 1 capsule (145 mcg total) by mouth daily.  30 capsule  3  . Multiple Vitamin (MULTIVITAMIN) tablet Take 1 tablet by mouth daily.      . nitrofurantoin, macrocrystal-monohydrate, (MACROBID) 100 MG capsule Take 100 mg by mouth as needed.       . promethazine (PHENERGAN) 25 MG tablet Take 1 tablet (25 mg total) by mouth every 6 (six) hours as needed for nausea or vomiting.  30 tablet  1  . Red Yeast Rice Extract 500 MG/0.5GM POWD Take by mouth.      . traMADol (ULTRAM) 50 MG tablet Take 1 tablet (50 mg total) by mouth every 6 (six) hours as needed.  40 tablet  3  . zolmitriptan (ZOMIG) 5 MG tablet Take 5 mg by mouth as needed for migraine.      [name]  zolpidem (AMBIEN) 10 MG tablet Take 1 tablet (10 mg total) by mouth at bedtime as needed for sleep.  30 tablet  3   No current facility-administered medications on file prior to visit.    No Known Allergies  Past Medical History  Diagnosis Date  . Cancer 02/27/2007    Breast Cancer  . Migraine   . Hypercholesterolemia     Past Surgical History  Procedure Laterality Date  . Cesarean section      two times  . Abdominal hysterectomy    . Breast surgery  01/14/2013    breast reduction left side  . Foot surgery Right     History  Smoking status  . Never Smoker   Smokeless tobacco  . Never Used    History  Alcohol Use No    Family History  Problem Relation Age of Onset  . Thyroid disease Mother   . Hypertension Mother   . Hyperlipidemia Mother   . Alzheimer's disease Mother   . Heart failure Mother   . Cancer Father     throat cancer  . Hyperlipidemia  Father   . Heart murmur Father   . Diabetes Sister   . Alzheimer's disease Maternal Aunt   . Alzheimer's disease Maternal Uncle   . Diabetes Paternal Uncle   . Alzheimer's disease Maternal Grandmother   . Diabetes Paternal Grandmother     Reviw of Systems:  Reviewed in the HPI.  All other systems are negative.  Physical Exam: Blood pressure 118/88, pulse 80, height 5' 5.5" (1.664 m), weight 179 lb 4 oz (81.307 kg). Wt Readings from Last 3 Encounters:  05/30/14 179 lb 4 oz (81.307 kg)  05/11/14 176 lb (79.833 kg)  05/10/14 177 lb 3.2 oz (80.377 kg)     General: Well developed, well nourished, in no acute distress.  Head: Normocephalic, atraumatic, sclera non-icteric, mucus membranes are moist,   Neck: Supple. Carotids are 2 + without bruits. No JVD   Lungs: Clear   Heart: RR, normal S1S2  Abdomen: Soft, non-tender, non-distended with normal bowel sounds.  Msk:  Strength and tone are normal   Extremities: No clubbing or cyanosis. Trace  edema.  Distal pedal pulses are 2+ and equal    Neuro: CN  II - XII intact.  Alert and oriented X 3.   Psych:  Normal    ECG: 05/30/2014: Normal sinus rhythm at 68. She has no ST or T wave changes.  Assessment / Plan:

## 2014-05-30 NOTE — Patient Instructions (Signed)
Your physician recommends that you schedule a follow-up appointment in:  As needed  Your physician recommends that you continue on your current medications as directed. Please refer to the Current Medication list given to you today.  

## 2014-05-30 NOTE — Assessment & Plan Note (Signed)
[name] presents for further evaluation of her leg edema. On exam she has only trace edema. She notes weight gain and some shortness of breath with exertion that has been there for almost a year.  He pro BNP was found to be completely normal-17.  She does not exercise on a regular basis.  She goes out to  eat a lot and so probably eats a lot of extra salt.  I've encouraged her to start a regular exercise program. She needs to work up to exercising one hour a day.  I've advised her to be extremely careful with her salt intake.  If She's not able to advance in her exercise program I've advised her to call us back and we can evaluate things further. At this point I do not think that she needs an echocardiogram but we can certainly order one if she is not able to exercise or has worsening symptoms.

## 2014-06-01 ENCOUNTER — Encounter: Payer: Self-pay | Admitting: Nurse Practitioner

## 2014-06-01 DIAGNOSIS — G43909 Migraine, unspecified, not intractable, without status migrainosus: Secondary | ICD-10-CM

## 2014-06-09 MED ORDER — BUPROPION HCL 75 MG PO TABS: 75.0000 mg | ORAL_TABLET | Freq: Two times a day (BID) | ORAL | Status: DC

## 2014-06-09 NOTE — Telephone Encounter (Signed)
Visit with the heart doctor came back wnl, she would like to go ahead and have Wellbutrin called in for her.  Will start with the 75mg  tablets taking once daily for one week then increase to bid.  She will take for one month and we will then evaluate to see if she needs to increase back to the 150mg  xl dose.

## 2014-06-15 ENCOUNTER — Other Ambulatory Visit: Payer: Self-pay | Admitting: Family Medicine

## 2014-06-15 NOTE — Telephone Encounter (Signed)
Last office visit 05/11/2014.  Last refilled 03/14/2014 for #40 with 3 refills.  Ok to refill?

## 2014-06-15 NOTE — Telephone Encounter (Signed)
Called to [facility]

## 2014-06-27 ENCOUNTER — Other Ambulatory Visit: Payer: Self-pay | Admitting: Nurse Practitioner

## 2014-06-29 ENCOUNTER — Other Ambulatory Visit: Payer: Self-pay | Admitting: Family Medicine

## 2014-06-29 ENCOUNTER — Encounter: Payer: Self-pay | Admitting: Family Medicine

## 2014-06-29 DIAGNOSIS — K589 Irritable bowel syndrome without diarrhea: Secondary | ICD-10-CM

## 2014-06-29 MED ORDER — LINACLOTIDE 290 MCG PO CAPS: 290.0000 ug | ORAL_CAPSULE | Freq: Every day | ORAL | Status: DC

## 2014-07-07 ENCOUNTER — Telehealth: Payer: Self-pay | Admitting: *Deleted

## 2014-07-07 DIAGNOSIS — G43009 Migraine without aura, not intractable, without status migrainosus: Secondary | ICD-10-CM

## 2014-07-07 MED ORDER — ZOLMITRIPTAN 5 MG PO TABS: 5.0000 mg | ORAL_TABLET | ORAL | Status: DC | PRN

## 2014-07-07 NOTE — Telephone Encounter (Signed)
Pharmacy request refill zomig

## 2014-07-18 ENCOUNTER — Encounter: Payer: Self-pay | Admitting: Cardiovascular Disease

## 2014-07-19 ENCOUNTER — Other Ambulatory Visit: Payer: Self-pay | Admitting: *Deleted

## 2014-07-19 DIAGNOSIS — G43909 Migraine, unspecified, not intractable, without status migrainosus: Secondary | ICD-10-CM

## 2014-07-19 MED ORDER — IBUPROFEN 800 MG PO TABS: ORAL_TABLET | ORAL | Status: DC

## 2014-07-19 NOTE — Telephone Encounter (Signed)
Pharmacy sent fax request for refill ibuprofen 800 mg.

## 2014-08-08 ENCOUNTER — Ambulatory Visit (INDEPENDENT_AMBULATORY_CARE_PROVIDER_SITE_OTHER): Payer: BC Managed Care – PPO | Admitting: Family Medicine

## 2014-08-08 ENCOUNTER — Encounter: Payer: Self-pay | Admitting: Family Medicine

## 2014-08-08 VITALS — BP 114/68 | HR 81 | Temp 98.2°F | Wt 178.5 lb

## 2014-08-08 DIAGNOSIS — R1013 Epigastric pain: Secondary | ICD-10-CM

## 2014-08-08 DIAGNOSIS — E538 Deficiency of other specified B group vitamins: Secondary | ICD-10-CM

## 2014-08-08 LAB — COMPREHENSIVE METABOLIC PANEL WITH GFR
ALT: 19 U/L (ref 0–35)
AST: 22 U/L (ref 0–37)
Albumin: 4.1 g/dL (ref 3.5–5.2)
Alkaline Phosphatase: 75 U/L (ref 39–117)
BUN: 15 mg/dL (ref 6–23)
CO2: 30 meq/L (ref 19–32)
Calcium: 9.2 mg/dL (ref 8.4–10.5)
Chloride: 103 meq/L (ref 96–112)
Creatinine, Ser: 0.8 mg/dL (ref 0.4–1.2)
GFR: 81.85 mL/min
Glucose, Bld: 100 mg/dL — ABNORMAL HIGH (ref 70–99)
Potassium: 3.8 meq/L (ref 3.5–5.1)
Sodium: 141 meq/L (ref 135–145)
Total Bilirubin: 0.2 mg/dL (ref 0.2–1.2)
Total Protein: 7.4 g/dL (ref 6.0–8.3)

## 2014-08-08 LAB — H. PYLORI ANTIBODY, IGG: H Pylori IgG: NEGATIVE

## 2014-08-08 LAB — LIPASE: Lipase: 51 U/L (ref 11.0–59.0)

## 2014-08-08 MED ORDER — CYANOCOBALAMIN 1000 MCG/ML IJ SOLN
1000.0000 ug | Freq: Once | INTRAMUSCULAR | Status: AC
  Administered 2014-08-08: 1000 ug via INTRAMUSCULAR

## 2014-08-08 MED ORDER — ESOMEPRAZOLE MAGNESIUM 40 MG PO CPDR: 40.0000 mg | DELAYED_RELEASE_CAPSULE | Freq: Two times a day (BID) | ORAL | Status: DC

## 2014-08-08 NOTE — Progress Notes (Signed)
Subjective:   Patient ID: [name], female    DOB: [birth year], 58 y.o.   MRN: [id number]  [name] is a pleasant 58 y.o. year old female who presents to clinic today with Abdominal Pain  on 08/08/2014  HPI: Over 6 months- 1 year of intermittent epigastric pain.  Came in today because last weekend, it woke her up from sleep and lasted several hours.  Cannot remember what she ate the night prior.  She does sometimes have feeling of acid coming up but did not have it that night.  Was belching and felt bloated.  Took an antacid but does not know what actually relieved it.  Does not take PPI or H2 blocker regularly.  She did notice the pain once after eating spaghetti with tomato sauce. She does drink coffee.  No nausea, vomiting or black stools.  Did go through a period when she felt some difficulty swallowing intermittently.  Has never had an endoscopy.  Her father "had to have esophagus stretched." Colonoscopy UTD- 6/3/209- Dr. Olevia Perches.  Current Outpatient Prescriptions on File Prior to Visit  Medication Sig Dispense Refill  . buPROPion (WELLBUTRIN) 75 MG tablet Take 1 tablet (75 mg total) by mouth 2 (two) times daily. 60 tablet 3  . citalopram (CELEXA) 20 MG tablet Take 1 tablet (20 mg total) by mouth daily. 30 tablet 5  . hydrochlorothiazide (MICROZIDE) 12.5 MG capsule 1 tab by mouth daily prn swelling 30 capsule 1  . ibuprofen (ADVIL,MOTRIN) 800 MG tablet TAKE ONE TABLET BY MOUTH EVERY 8 HOURS AS NEEDED FOR RELIEF FROM PAIN 60 tablet 3  . Linaclotide (LINZESS) 290 MCG CAPS capsule Take 1 capsule (290 mcg total) by mouth daily. 30 capsule 3  . Multiple Vitamin (MULTIVITAMIN) tablet Take 1 tablet by mouth daily.    . nitrofurantoin, macrocrystal-monohydrate, (MACROBID) 100 MG capsule Take 100 mg by mouth as needed.     . nitrofurantoin, macrocrystal-monohydrate, (MACROBID) 100 MG capsule TAKE ONE CAPSULE BY MOUTH ONE TIME DAILY  15 capsule 5  . promethazine (PHENERGAN) 25 MG  tablet Take 1 tablet (25 mg total) by mouth every 6 (six) hours as needed for nausea or vomiting. 30 tablet 1  . Red Yeast Rice Extract 500 MG/0.5GM POWD Take by mouth.    . traMADol (ULTRAM) 50 MG tablet TAKE ONE TABLET BY MOUTH EVERY SIX HOURS AS NEEDED  40 tablet 2  . zolmitriptan (ZOMIG) 5 MG tablet Take 1 tablet (5 mg total) by mouth as needed for migraine. 10 tablet 5  . zolpidem (AMBIEN) 10 MG tablet Take 1 tablet (10 mg total) by mouth at bedtime as needed for sleep. 30 tablet 3   No current facility-administered medications on file prior to visit.    No Known Allergies  Past Medical History  Diagnosis Date  . Cancer 02/27/2007    Breast Cancer  . Migraine   . Hypercholesterolemia     Past Surgical History  Procedure Laterality Date  . Cesarean section      two times  . Abdominal hysterectomy    . Breast surgery  01/14/2013    breast reduction left side  . Foot surgery Right     Family History  Problem Relation Age of Onset  . Thyroid disease Mother   . Hypertension Mother   . Hyperlipidemia Mother   . Alzheimer's disease Mother   . Heart failure Mother   . Cancer Father     throat cancer  . Hyperlipidemia Father   .  Heart murmur Father   . Diabetes Sister   . Alzheimer's disease Maternal Aunt   . Alzheimer's disease Maternal Uncle   . Diabetes Paternal Uncle   . Alzheimer's disease Maternal Grandmother   . Diabetes Paternal Grandmother     History   Social History  . Marital Status: Married    Spouse Name: N/A    Number of Children: N/A  . Years of Education: N/A   Occupational History  . Not on file.   Social History Main Topics  . Smoking status: Never Smoker   . Smokeless tobacco: Never Used  . Alcohol Use: No  . Drug Use: No  . Sexual Activity:    Partners: Male    Birth Control/ Protection: Surgical   Other Topics Concern  . Not on file   Social History Narrative   The PMH, PSH, Social History, Family History, Medications, and  allergies have been reviewed in [facility], and have been updated if relevant.   Review of Systems  Constitutional: Negative.   HENT: Negative.   Eyes: Negative.   Respiratory: Negative.   Cardiovascular: Negative.   Gastrointestinal: Positive for abdominal pain and abdominal distention. Negative for nausea, vomiting, diarrhea, blood in stool, anal bleeding and rectal pain.  Skin: Negative.   Hematological: Negative.   Psychiatric/Behavioral: Negative.       See HPI  Objective:    BP 114/68 mmHg  Pulse 81  Temp(Src) 98.2 F (36.8 C) (Oral)  Wt 178 lb 8 oz (80.967 kg)  SpO2 95%   Physical Exam  Constitutional: She is oriented to person, place, and time. She appears well-developed and well-nourished. No distress.  Abdominal: Soft. Normal appearance and bowel sounds are normal. She exhibits no shifting dullness, no distension, no pulsatile liver and no fluid wave. There is no tenderness. There is no rebound.  Neurological: She is alert and oriented to person, place, and time.  Skin: Skin is warm and dry.  Psychiatric: She has a normal mood and affect. Her behavior is normal. Judgment and thought content normal.  Nursing note and vitals reviewed.         Assessment & Plan:   Abdominal pain, epigastric - Plan: Lipase, Comprehensive metabolic panel, H. pylori antibody, IgG, Ambulatory referral to Gastroenterology No Follow-up on file.

## 2014-08-08 NOTE — Progress Notes (Signed)
Pre visit review using our clinic review tool, if applicable. No additional management support is needed unless otherwise documented below in the visit note. 

## 2014-08-08 NOTE — Patient Instructions (Signed)
Great to see you. Happy Thanksgiving.  Please stop by to see Vaughan Basta on your way out to set up your GI referral.  We will call you with your lab results.  Gastroesophageal Reflux Disease, Adult Gastroesophageal reflux disease (GERD) happens when acid from your stomach goes into your food pipe (esophagus). The acid can cause a burning feeling in your chest. Over time, the acid can make small holes (ulcers) in your food pipe.  HOME CARE  Ask your doctor for advice about:  Losing weight.  Quitting smoking.  Alcohol use.  Avoid foods and drinks that make your problems worse. You may want to avoid:  Caffeine and alcohol.  Chocolate.  Mints.  Garlic and onions.  Spicy foods.  Citrus fruits, such as oranges, lemons, or limes.  Foods that contain tomato, such as sauce, chili, salsa, and pizza.  Fried and fatty foods.  Avoid lying down for 3 hours before you go to bed or before you take a nap.  Eat small meals often, instead of large meals.  Wear loose-fitting clothing. Do not wear anything tight around your waist.  Raise (elevate) the head of your bed 6 to 8 inches with wood blocks. Using extra pillows does not help.  Only take medicines as told by your doctor.  Do not take aspirin or ibuprofen. GET HELP RIGHT AWAY IF:   You have pain in your arms, neck, jaw, teeth, or back.  Your pain gets worse or changes.  You feel sick to your stomach (nauseous), throw up (vomit), or sweat (diaphoresis).  You feel short of breath, or you pass out (faint).  Your throw up is green, yellow, black, or looks like coffee grounds or blood.  Your poop (stool) is red, bloody, or black. MAKE SURE YOU:   Understand these instructions.  Will watch your condition.  Will get help right away if you are not doing well or get worse. Document Released: 02/19/2008 Document Revised: 11/25/2011 Document Reviewed: 03/22/2011 [facility] Patient Information 2015 [place], [place]. This information  is not intended to replace advice given to you by your health care provider. Make sure you discuss any questions you have with your health care provider.

## 2014-08-08 NOTE — Assessment & Plan Note (Signed)
Persistent- most consistent with PUD/GERD. Given rx for nexium to take daily.  Given GERD friendly handout. Check lipase, H pylori and CMET today. Refer to Dr. Hilarie Fredrickson for endoscopy.

## 2014-08-09 ENCOUNTER — Encounter: Payer: Self-pay | Admitting: Family Medicine

## 2014-08-09 ENCOUNTER — Encounter: Payer: Self-pay | Admitting: Nurse Practitioner

## 2014-08-15 ENCOUNTER — Ambulatory Visit (INDEPENDENT_AMBULATORY_CARE_PROVIDER_SITE_OTHER): Payer: BC Managed Care – PPO | Admitting: Nurse Practitioner

## 2014-08-15 ENCOUNTER — Encounter: Payer: Self-pay | Admitting: Nurse Practitioner

## 2014-08-15 VITALS — BP 132/80 | HR 88 | Ht 65.5 in | Wt 175.2 lb

## 2014-08-15 DIAGNOSIS — R1310 Dysphagia, unspecified: Secondary | ICD-10-CM | POA: Insufficient documentation

## 2014-08-15 DIAGNOSIS — R1013 Epigastric pain: Secondary | ICD-10-CM

## 2014-08-15 NOTE — Patient Instructions (Addendum)
You have been scheduled for an endoscopy. Please follow written instructions given to you at your visit today. If you use inhalers (even only as needed), please bring them with you on the day of your procedure. Your physician has requested that you go to www.startemmi.com and enter the access code given to you at your visit today(SENT TO YOUR E-MAIL). This web site gives a general overview about your procedure. However, you should still follow specific instructions given to you by our office regarding your preparation for the procedure.  Information on reflux is below for you to read over  Continue Nexium __________________________________________________________________________________________________________________  Gastroesophageal Reflux Disease, Adult Gastroesophageal reflux disease (GERD) happens when acid from your stomach flows up into the esophagus. When acid comes in contact with the esophagus, the acid causes soreness (inflammation) in the esophagus. Over time, GERD may create small holes (ulcers) in the lining of the esophagus. CAUSES   Increased body weight. This puts pressure on the stomach, making acid rise from the stomach into the esophagus.  Smoking. This increases acid production in the stomach.  Drinking alcohol. This causes decreased pressure in the lower esophageal sphincter (valve or ring of muscle between the esophagus and stomach), allowing acid from the stomach into the esophagus.  Late evening meals and a full stomach. This increases pressure and acid production in the stomach.  A malformed lower esophageal sphincter. Sometimes, no cause is found. SYMPTOMS   Burning pain in the lower part of the mid-chest behind the breastbone and in the mid-stomach area. This may occur twice a week or more often.  Trouble swallowing.  Sore throat.  Dry cough.  Asthma-like symptoms including chest tightness, shortness of breath, or wheezing. DIAGNOSIS  Your caregiver may  be able to diagnose GERD based on your symptoms. In some cases, X-rays and other tests may be done to check for complications or to check the condition of your stomach and esophagus. TREATMENT  Your caregiver may recommend over-the-counter or prescription medicines to help decrease acid production. Ask your caregiver before starting or adding any new medicines.  HOME CARE INSTRUCTIONS   Change the factors that you can control. Ask your caregiver for guidance concerning weight loss, quitting smoking, and alcohol consumption.  Avoid foods and drinks that make your symptoms worse, such as:  Caffeine or alcoholic drinks.  Chocolate.  Peppermint or mint flavorings.  Garlic and onions.  Spicy foods.  Citrus fruits, such as oranges, lemons, or limes.  Tomato-based foods such as sauce, chili, salsa, and pizza.  Fried and fatty foods.  Avoid lying down for the 3 hours prior to your bedtime or prior to taking a nap.  Eat small, frequent meals instead of large meals.  Wear loose-fitting clothing. Do not wear anything tight around your waist that causes pressure on your stomach.  Raise the head of your bed 6 to 8 inches with wood blocks to help you sleep. Extra pillows will not help.  Only take over-the-counter or prescription medicines for pain, discomfort, or fever as directed by your caregiver.  Do not take aspirin, ibuprofen, or other nonsteroidal anti-inflammatory drugs (NSAIDs). SEEK IMMEDIATE MEDICAL CARE IF:   You have pain in your arms, neck, jaw, teeth, or back.  Your pain increases or changes in intensity or duration.  You develop nausea, vomiting, or sweating (diaphoresis).  You develop shortness of breath, or you faint.  Your vomit is green, yellow, black, or looks like coffee grounds or blood.  Your stool is red, bloody, or  black. These symptoms could be signs of other problems, such as heart disease, gastric bleeding, or esophageal bleeding. MAKE SURE YOU:    Understand these instructions.  Will watch your condition.  Will get help right away if you are not doing well or get worse. Document Released: 06/12/2005 Document Revised: 11/25/2011 Document Reviewed: 03/22/2011 [facility] Patient Information 2015 [place], [place]. This information is not intended to replace advice given to you by your health care provider. Make sure you discuss any questions you have with your health care provider.

## 2014-08-15 NOTE — Progress Notes (Signed)
HPI :  Patient is an 58 year old female known remotely to Dr. Olevia Perches. She is referred for evaluation of intermittent epigastric pain which started 1-2 years ago. Episodes can last for several days but then she may be pain free for months . Pain is non-radiating  not related to eating though spicy foods can give her reflux / pyrosis.  She takes over-the-counter antacid medications as needed . PCP recently started her on daily Nexium which seems to be helping. She seldom takes NSAIDs. Patient endorses occasional solid food dysphagia. Weight fluctuates but overall she is up several pounds over last month. She has chronic constipation managed with daily Linzess.   Recent CBC ,lipase and liver function studies were normal. H. pylori antibody negative.   Past Medical History  Diagnosis Date  . Breast cancer 02/27/2007  . Migraine   . Hypercholesterolemia     Family History  Problem Relation Age of Onset  . Thyroid disease Mother   . Hypertension Mother   . Hyperlipidemia Mother   . Alzheimer's disease Mother   . Heart failure Mother   . Esophageal cancer Father   . Hyperlipidemia Father   . Heart murmur Father   . Diabetes Sister   . Alzheimer's disease Maternal Aunt   . Alzheimer's disease Maternal Uncle   . Diabetes Paternal Uncle   . Alzheimer's disease Maternal Grandmother   . Diabetes Paternal Grandmother   . Colon polyps Father   . Colon cancer Neg Hx   . Breast cancer Sister   . Irritable bowel syndrome Sister    History  Substance Use Topics  . Smoking status: Never Smoker   . Smokeless tobacco: Never Used  . Alcohol Use: No   Current Outpatient Prescriptions  Medication Sig Dispense Refill  . buPROPion (WELLBUTRIN) 75 MG tablet Take 1 tablet (75 mg total) by mouth 2 (two) times daily. 60 tablet 3  . citalopram (CELEXA) 20 MG tablet Take 1 tablet (20 mg total) by mouth daily. 30 tablet 5  . cyanocobalamin (,VITAMIN B-12,) 1000 MCG/ML injection Inject 1,000 mcg into  the muscle every 30 (thirty) days.    [name] esomeprazole (NEXIUM) 40 MG capsule Take 1 capsule (40 mg total) by mouth 2 (two) times daily before a meal. 60 capsule 1  . ibuprofen (ADVIL,MOTRIN) 800 MG tablet TAKE ONE TABLET BY MOUTH EVERY 8 HOURS AS NEEDED FOR RELIEF FROM PAIN 60 tablet 3  . Linaclotide (LINZESS) 290 MCG CAPS capsule Take 1 capsule (290 mcg total) by mouth daily. 30 capsule 3  . Multiple Vitamin (MULTIVITAMIN) tablet Take 1 tablet by mouth daily.    . nitrofurantoin, macrocrystal-monohydrate, (MACROBID) 100 MG capsule TAKE ONE CAPSULE BY MOUTH ONE TIME DAILY  (Patient taking differently: TAKE ONE CAPSULE BY MOUTH ONE TIME DAILY prn) 15 capsule 5  . promethazine (PHENERGAN) 25 MG tablet Take 1 tablet (25 mg total) by mouth every 6 (six) hours as needed for nausea or vomiting. 30 tablet 1  . Red Yeast Rice Extract 500 MG/0.5GM POWD Take by mouth.    . traMADol (ULTRAM) 50 MG tablet TAKE ONE TABLET BY MOUTH EVERY SIX HOURS AS NEEDED  40 tablet 2  . zolmitriptan (ZOMIG) 5 MG tablet Take 1 tablet (5 mg total) by mouth as needed for migraine. 10 tablet 5  . hydrochlorothiazide (MICROZIDE) 12.5 MG capsule 1 tab by mouth daily prn swelling (Patient not taking: Reported on 08/15/2014) 30 capsule 1  . zolpidem (AMBIEN) 10 MG tablet Take 1  tablet (10 mg total) by mouth at bedtime as needed for sleep. 30 tablet 3   No current facility-administered medications for this visit.   No Known Allergies   Review of Systems: All systems reviewed and negative except where noted in HPI.   Physical Exam: BP 132/80 mmHg  Pulse 88  Ht 5' 5.5" (1.664 m)  Wt 175 lb 3.2 oz (79.47 kg)  BMI 28.70 kg/m2 Constitutional: Pleasant,well-developed, white female in no acute distress. HEENT: Normocephalic and atraumatic. Conjunctivae are normal. No scleral icterus. Neck supple.  Cardiovascular: Normal rate, regular rhythm.  Pulmonary/chest: Effort normal and breath sounds normal. No wheezing, rales or  rhonchi. Abdominal: Soft, nondistended, nontender. Bowel sounds active throughout. There are no masses palpable. No hepatomegaly. Extremities: no edema Lymphadenopathy: No cervical adenopathy noted. Neurological: Alert and oriented to person place and time. Skin: Skin is warm and dry. No rashes noted. Psychiatric: Normal mood and affect. Behavior is normal.   ASSESSMENT AND PLAN:  13. 58 year old female with chronic, intermittent, non-radiating epigastric pain over last 1-2 years. Recent initiation of Nexium seems to be helping. We discussed giving Nexium more time and if no improvement then proceeding with EGD. She has no alarm features. Patient would like to be tentatively scheduled for EGD for evaluation of pain (as well as dysphagia) but will cancel procedure if symptoms resolve on Nexium. The benefits, risks, and potential complications of EGD with possible biopsies and/or dilation were discussed with the patient and she agrees to proceed. Recent labs were normal. If EGD negative and pain persists will likely need imaging.  2. Intermittent solid food dysphagia. Further evaluation at time of EGD. If she opts not to proceed with EGD we could get a barium swallow.    3. [place] of "throat cancer" in father. No further details available.

## 2014-08-16 NOTE — Progress Notes (Signed)
Reviewed and agree.

## 2014-08-22 ENCOUNTER — Encounter: Payer: Self-pay | Admitting: Internal Medicine

## 2014-08-31 ENCOUNTER — Encounter: Payer: Self-pay | Admitting: Internal Medicine

## 2014-08-31 ENCOUNTER — Ambulatory Visit (AMBULATORY_SURGERY_CENTER): Payer: BC Managed Care – PPO | Admitting: Internal Medicine

## 2014-08-31 VITALS — BP 165/87 | HR 68 | Temp 98.0°F | Resp 17 | Ht 65.5 in | Wt 175.0 lb

## 2014-08-31 DIAGNOSIS — R1013 Epigastric pain: Secondary | ICD-10-CM

## 2014-08-31 DIAGNOSIS — K2990 Gastroduodenitis, unspecified, without bleeding: Secondary | ICD-10-CM

## 2014-08-31 DIAGNOSIS — K219 Gastro-esophageal reflux disease without esophagitis: Secondary | ICD-10-CM

## 2014-08-31 DIAGNOSIS — R1310 Dysphagia, unspecified: Secondary | ICD-10-CM

## 2014-08-31 DIAGNOSIS — K2970 Gastritis, unspecified, without bleeding: Secondary | ICD-10-CM

## 2014-08-31 MED ORDER — SODIUM CHLORIDE 0.9 % IV SOLN: 500.0000 mL | INTRAVENOUS | Status: DC

## 2014-08-31 NOTE — Progress Notes (Signed)
Report to PACU, RN, vss, BBS= Clear.  

## 2014-08-31 NOTE — Progress Notes (Signed)
Called to room to assist during endoscopic procedure.  Patient ID and intended procedure confirmed with present staff. Received instructions for my participation in the procedure from the performing physician.  

## 2014-08-31 NOTE — Op Note (Signed)
[facility]  [address] [place], [id number]   ENDOSCOPY PROCEDURE REPORT  PATIENT: [name], [name]  MR#: [id number] BIRTHDATE: Mar 27, 1956 , 89  yrs. old GENDER: female ENDOSCOPIST: Lafayette Dragon, MD REFERRED BY:  Arnette Norris, M.D. PROCEDURE DATE:  08/31/2014 PROCEDURE:  EGD w/ biopsy and Maloney dilation of esophagus ASA CLASS:     Class I INDICATIONS:  dysphagia, epigastric pain, chest pain, and epigastric pain -1 year, father had throat cancer,improved on Nexium. MEDICATIONS: Monitored anesthesia care and Propofol 200 mg IV TOPICAL ANESTHETIC: none  DESCRIPTION OF PROCEDURE: After the risks benefits and alternatives of the procedure were thoroughly explained, informed consent was obtained.  The LB [id number] endoscope was introduced through the mouth and advanced to the second portion of the duodenum , Without limitations.  The instrument was slowly withdrawn as the mucosa was fully examined.    Esophagus: proximal and mid esophageal mucosa was normal. Z line was irregular. There was no sign of esophagitis or hiatal hernia. There was no stricture. Biopsies were obtained from the Z line to rule out Barrett's esophagus Stomach: gastric folds were normal. Gastric antrum and pyloric outlet showed mild erythema. There were patchy erosions[ retroflexion of the endoscope revealed normal fundus and cardia.Biopsies were taken from the gastric antrum to rule out H. pylori Duodenum: duodenal bulb and descending duodenum was normal Maloney dilator 3 [place] passed through the esophagus without difficulty. There was blood on the dilator      The scope was then withdrawn from the patient and the procedure completed.  COMPLICATIONS: There were no immediate complications.  ENDOSCOPIC IMPRESSION: 1.no evidence of esophageal stricture. Passage of 48 [place] Maloney dilator 2.Irregular Z line. Biopsies to rule out Barrett's esophagus 3.Mild antral gastritis.  Biopsies to rule out H. pylori  RECOMMENDATIONS: 1.  Anti-reflux regimen to be follow 2.  Await pathology results 3.  Continue PPI  REPEAT EXAM: for EGD pending biopsy results.  eSigned:  Lafayette Dragon, MD 08/31/2014 11:11 AM    CC:  PATIENT NAME:  [name], [name] MR#: [id number]

## 2014-08-31 NOTE — Patient Instructions (Signed)
YOU HAD AN ENDOSCOPIC PROCEDURE TODAY AT [facility]: Refer to the procedure report that was given to you for any specific questions about what was found during the examination.  If the procedure report does not answer your questions, please call your gastroenterologist to clarify.  If you requested that your care partner not be given the details of your procedure findings, then the procedure report has been included in a sealed envelope for you to review at your convenience later.  YOU SHOULD EXPECT: Some feelings of bloating in the abdomen. Passage of more gas than usual.  Walking can help get rid of the air that was put into your GI tract during the procedure and reduce the bloating. If you had a lower endoscopy (such as a colonoscopy or flexible sigmoidoscopy) you may notice spotting of blood in your stool or on the toilet paper. If you underwent a bowel prep for your procedure, then you may not have a normal bowel movement for a few days.  DIET: Your first meal following the procedure should be a light meal and then it is ok to progress to your normal diet.  A half-sandwich or bowl of soup is an example of a good first meal.  Heavy or fried foods are harder to digest and may make you feel nauseous or bloated.  Likewise meals heavy in dairy and vegetables can cause extra gas to form and this can also increase the bloating.  Drink plenty of fluids but you should avoid alcoholic beverages for 24 hours.  ACTIVITY: Your care partner should take you home directly after the procedure.  You should plan to take it easy, moving slowly for the rest of the day.  You can resume normal activity the day after the procedure however you should NOT DRIVE or use heavy machinery for 24 hours (because of the sedation medicines used during the test).    SYMPTOMS TO REPORT IMMEDIATELY: A gastroenterologist can be reached at any hour.  During normal business hours, 8:30 AM to 5:00 PM Monday through Friday,  call [phone].  After hours and on weekends, please call the GI answering service at [phone] who will take a message and have the physician on call contact you.     Following upper endoscopy (EGD)  Vomiting of blood or coffee ground material  New chest pain or pain under the shoulder blades  Painful or persistently difficult swallowing  New shortness of breath  Fever of 100F or higher  Black, tarry-looking stools  FOLLOW UP: If any biopsies were taken you will be contacted by phone or by letter within the next 1-3 weeks.  Call your gastroenterologist if you have not heard about the biopsies in 3 weeks.  Our staff will call the home number listed on your records the next business day following your procedure to check on you and address any questions or concerns that you may have at that time regarding the information given to you following your procedure. This is a courtesy call and so if there is no answer at the home number and we have not heard from you through the emergency physician on call, we will assume that you have returned to your regular daily activities without incident.  SIGNATURES/CONFIDENTIALITY: You and/or your care partner have signed paperwork which will be entered into your electronic medical record.  These signatures attest to the fact that that the information above on your After Visit Summary has been reviewed and is understood.  Full responsibility of the confidentiality of this discharge information lies with you and/or your care-partner.  Post dilation diet information given.  Increase Nexium to 40 mg. Twice daily.  Gastritis and anti reflux regimen given.

## 2014-09-01 ENCOUNTER — Telehealth: Payer: Self-pay | Admitting: *Deleted

## 2014-09-01 NOTE — Telephone Encounter (Signed)
No answer,no voicemail on f/u callback

## 2014-09-06 ENCOUNTER — Encounter: Payer: Self-pay | Admitting: Internal Medicine

## 2014-09-19 ENCOUNTER — Telehealth: Payer: Self-pay

## 2014-09-19 NOTE — Telephone Encounter (Signed)
PLEASE NOTE: All timestamps contained within this report are represented as [place] Standard Time. CONFIDENTIALTY NOTICE: This fax transmission is intended only for the addressee. It contains information that is legally privileged, confidential or otherwise protected from use or disclosure. If you are not the intended recipient, you are strictly prohibited from reviewing, disclosing, copying using or disseminating any of this information or taking any action in reliance on or regarding this information. If you have received this fax in error, please notify us immediately by telephone so that we can arrange for its return to [place]. Phone: [phone], Toll-Free: [phone], Fax: [phone] Page: 1 of 2 Call Id: [id number] [place] Patient Name: [name] Gender: Female DOB: 1956/06/06 Age: 59 Y 21 D Return Phone Number: [phone] (Primary), [phone] (Secondary) Address: [place] City/State/Zip: [place] [address] Client [facility] - Client Client Site [facility] - Day Physician Arnette Norris Contact Type Call Call Type Triage / Clinical Relationship To Patient Self Return Phone Number [phone] (Primary) Chief Complaint Arm Pain Initial Comment Caller states she is having left arm pain down the back of it, started last week. Has appt Wednesday. [place] probably PreDisposition Call Doctor Nurse Assessment Nurse: Verlin Fester, RN, [facility] Date/Time Eilene Ghazi Time): 09/19/2014 12:03:57 PM Confirm and document reason for call. If symptomatic, describe symptoms. ---Patient states she is having left arm pain that runs down her left arm from the shoulder to the elbow for the last wee. Has the patient traveled out of the country within the last 30 days? ---Not Applicable Does the patient require triage? ---Yes Related  visit to physician within the last 2 weeks? ---No Does the PT have any chronic conditions? (i.e. diabetes, asthma, etc.) ---No Guidelines Guideline Title Affirmed Question Affirmed Notes Nurse Date/Time (Eastern Time) Arm Pain [1] Age > 40 AND [2] no obvious cause AND [3] pain even when not moving the arm (Exception: pain is clearly made worse by moving arm or bending neck) Verlin Fester, RN, [facility] 09/19/2014 12:05:48 PM Disp. Time Eilene Ghazi Time) Disposition Final User 09/19/2014 11:41:39 AM Attempt made - message left Atilano Ina 09/19/2014 12:09:12 PM Go to ED Now Yes Verlin Fester, RN, Nemia[facility] Commander Understands: Yes Disagree/Comply: Comply PLEASE NOTE: All timestamps contained within this report are represented as [place] Standard Time. CONFIDENTIALTY NOTICE: This fax transmission is intended only for the addressee. It contains information that is legally privileged, confidential or otherwise protected from use or disclosure. If you are not the intended recipient, you are strictly prohibited from reviewing, disclosing, copying using or disseminating any of this information or taking any action in reliance on or regarding this information. If you have received this fax in error, please notify us immediately by telephone so that we can arrange for its return to [place]. Phone: [phone], Toll-Free: [phone], Fax: [phone] Page: 2 of 2 Call Id: [id number] Care Advice Given Per Guideline GO TO ED NOW: You need to be seen in the Emergency Department. Go to the ER at ___________ [place] now. Drive carefully. NOTE TO TRIAGER - DRIVING: * Another adult should drive. * If immediate transportation is not available via car or taxi, then the patient should be instructed to call [id number]. BRING MEDICINES: * Please bring a list of your current medicines when you go to the Emergency Department (ER). * It is also a good idea to bring the pill bottles  too. This will help the doctor to make certain you are taking  the right medicines and the right dose. CARE ADVICE given per Arm Pain (Adult) guideline. After Care Instructions Given Call Event Type User Date / Time Description

## 2014-09-20 ENCOUNTER — Ambulatory Visit: Payer: BC Managed Care – PPO

## 2014-09-21 ENCOUNTER — Ambulatory Visit (INDEPENDENT_AMBULATORY_CARE_PROVIDER_SITE_OTHER): Payer: BLUE CROSS/BLUE SHIELD | Admitting: Family Medicine

## 2014-09-21 ENCOUNTER — Encounter: Payer: Self-pay | Admitting: Family Medicine

## 2014-09-21 VITALS — BP 120/76 | HR 70 | Temp 98.2°F | Wt 175.5 lb

## 2014-09-21 DIAGNOSIS — M7542 Impingement syndrome of left shoulder: Secondary | ICD-10-CM

## 2014-09-21 DIAGNOSIS — M75102 Unspecified rotator cuff tear or rupture of left shoulder, not specified as traumatic: Secondary | ICD-10-CM

## 2014-09-21 DIAGNOSIS — E538 Deficiency of other specified B group vitamins: Secondary | ICD-10-CM

## 2014-09-21 MED ORDER — OMEPRAZOLE 20 MG PO CPDR: 20.0000 mg | DELAYED_RELEASE_CAPSULE | Freq: Every day | ORAL | Status: DC

## 2014-09-21 MED ORDER — CYANOCOBALAMIN 1000 MCG/ML IJ SOLN
1000.0000 ug | Freq: Once | INTRAMUSCULAR | Status: AC
  Administered 2014-09-21: 1000 ug via INTRAMUSCULAR

## 2014-09-21 NOTE — Patient Instructions (Signed)
You have rotator cuff impingement Try to avoid painful activities (overhead activities, lifting with extended arm) as much as possible. Aleve 2 tabs twice a day with food OR ibuprofen 3 tabs three times a day with food for pain and inflammation. Can take tylenol in addition to this.  Subacromial injection may be beneficial to help with pain and to decrease inflammation.  If not improving at follow-up we will  imaging, physical therapy and/or shoulder injections.

## 2014-09-21 NOTE — Progress Notes (Signed)
Subjective:   Patient ID: [name], female    DOB: [birth year], 59 y.o.   MRN: [id number]  [name] is a pleasant 59 y.o. year old female who presents to clinic today with Shoulder Pain  on 09/21/2014  HPI: SUBJECTIVE: [name] is a 59 y.o. female who complains of  left shoulder/arm pain for 2 weeks. No known injury.  Pain is constant, usually does not go past her upper arm.  Feels like a "tooth ache."  Reaching for something behind her makes it worse.  Has not tried anything for the pain.  No UE weakness.  Symptoms have been constant since that time. Prior history of related problems: no prior problems with this area in the past.  She was concerned since this is her left arm if this was cardiac in nature. Denies associated CP, DOE, SOB, palpitations, dizziness or diaphoresis.  No jaw pain.  She is a non smoker. She does have h/o HLD which is improving. Lab Results  Component Value Date   CHOL 238* 05/09/2014   HDL 57.10 05/09/2014   LDLCALC 145* 05/09/2014   LDLDIRECT 208.2 08/31/2013   TRIG 179.0* 05/09/2014   CHOLHDL 4 05/09/2014     Current Outpatient Prescriptions on File Prior to Visit  Medication Sig Dispense Refill  . buPROPion (WELLBUTRIN) 75 MG tablet Take 1 tablet (75 mg total) by mouth 2 (two) times daily. 60 tablet 3  . citalopram (CELEXA) 20 MG tablet Take 1 tablet (20 mg total) by mouth daily. 30 tablet 5  . cyanocobalamin (,VITAMIN B-12,) 1000 MCG/ML injection Inject 1,000 mcg into the muscle every 30 (thirty) days.    . hydrochlorothiazide (MICROZIDE) 12.5 MG capsule 1 tab by mouth daily prn swelling 30 capsule 1  . ibuprofen (ADVIL,MOTRIN) 800 MG tablet TAKE ONE TABLET BY MOUTH EVERY 8 HOURS AS NEEDED FOR RELIEF FROM PAIN 60 tablet 3  . Multiple Vitamin (MULTIVITAMIN) tablet Take 1 tablet by mouth daily.    . nitrofurantoin, macrocrystal-monohydrate, (MACROBID) 100 MG capsule TAKE ONE CAPSULE BY MOUTH ONE TIME DAILY  (Patient taking differently:  TAKE ONE CAPSULE BY MOUTH ONE TIME DAILY prn) 15 capsule 5  . promethazine (PHENERGAN) 25 MG tablet Take 1 tablet (25 mg total) by mouth every 6 (six) hours as needed for nausea or vomiting. 30 tablet 1  . Red Yeast Rice Extract 500 MG/0.5GM POWD Take by mouth.    . traMADol (ULTRAM) 50 MG tablet TAKE ONE TABLET BY MOUTH EVERY SIX HOURS AS NEEDED  40 tablet 2  . zolmitriptan (ZOMIG) 5 MG tablet Take 1 tablet (5 mg total) by mouth as needed for migraine. 10 tablet 5  . zolpidem (AMBIEN) 10 MG tablet   2   No current facility-administered medications on file prior to visit.    No Known Allergies  Past Medical History  Diagnosis Date  . Breast cancer 02/27/2007  . Migraine   . Hypercholesterolemia   . Anemia   . Anxiety   . Blood transfusion without reported diagnosis     Past Surgical History  Procedure Laterality Date  . Cesarean section      two times  . Abdominal hysterectomy    . Breast surgery  01/14/2013    breast reduction left side  . Foot surgery Right   . Colonoscopy      Family History  Problem Relation Age of Onset  . Thyroid disease Mother   . Hypertension Mother   . Hyperlipidemia Mother   .  Alzheimer's disease Mother   . Heart failure Mother   . Esophageal cancer Father   . Hyperlipidemia Father   . Heart murmur Father   . Diabetes Sister   . Alzheimer's disease Maternal Aunt   . Alzheimer's disease Maternal Uncle   . Diabetes Paternal Uncle   . Alzheimer's disease Maternal Grandmother   . Diabetes Paternal Grandmother   . Colon polyps Father   . Colon cancer Neg Hx   . Breast cancer Sister   . Irritable bowel syndrome Sister   . Ulcerative colitis Sister     ????    History   Social History  . Marital Status: Married    Spouse Name: N/A    Number of Children: N/A  . Years of Education: N/A   Occupational History  . Not on file.   Social History Main Topics  . Smoking status: Never Smoker   . Smokeless tobacco: Never Used  . Alcohol  Use: No  . Drug Use: No  . Sexual Activity:    Partners: Male    Birth Control/ Protection: Surgical   Other Topics Concern  . Not on file   Social History Narrative   The PMH, PSH, Social History, Family History, Medications, and allergies have been reviewed in [facility], and have been updated if relevant.   Review of Systems  Constitutional: Negative.   HENT: Negative.   Respiratory: Negative.   Cardiovascular: Negative for chest pain, palpitations and leg swelling.  Gastrointestinal: Negative.   Endocrine: Negative.   Genitourinary: Negative.   Musculoskeletal: Negative for joint swelling, neck pain and neck stiffness.  Skin: Negative.   Allergic/Immunologic: Negative.   Neurological: Negative.   Hematological: Negative.   Psychiatric/Behavioral: Negative.   All other systems reviewed and are negative.      Objective:    BP 120/76 mmHg  Pulse 70  Temp(Src) 98.2 F (36.8 C) (Oral)  Wt 175 lb 8 oz (79.606 kg)  SpO2 99%   Physical Exam  Constitutional: She is oriented to person, place, and time. She appears well-developed and well-nourished. No distress.  HENT:  Head: Normocephalic.  Eyes: Conjunctivae are normal.  Neck: Normal range of motion.  Cardiovascular: Normal rate and regular rhythm.   Pulmonary/Chest: Effort normal and breath sounds normal.  Musculoskeletal: She exhibits no edema.  reduced range of motion of left shoulder- neg arch, positive impingement signs.  Neurological: She is alert and oriented to person, place, and time. No cranial nerve deficit.  Skin: Skin is warm and dry.  Psychiatric: She has a normal mood and affect. Her behavior is normal. Judgment and thought content normal.  Nursing note and vitals reviewed.         Assessment & Plan:   Rotator cuff impingement syndrome of left shoulder No Follow-up on file.

## 2014-09-21 NOTE — Assessment & Plan Note (Signed)
New- does not seem cardiac in nature- reassurance provided but did ask her to keep in close contact with me should symptoms not improve or change in anyway, ie develop new symptoms.  Advised supportive care as per AVS. No imaging warranted at this time but if symptoms do not improve with rest/NSAIDs, consider PT, imaging, and or subacromial injection. Call or return to clinic prn if these symptoms worsen or fail to improve as anticipated. The patient indicates understanding of these issues and agrees with the plan.

## 2014-09-21 NOTE — Progress Notes (Signed)
Pre visit review using our clinic review tool, if applicable. No additional management support is needed unless otherwise documented below in the visit note. 

## 2014-09-22 ENCOUNTER — Encounter: Payer: Self-pay | Admitting: Family Medicine

## 2014-10-13 ENCOUNTER — Ambulatory Visit (INDEPENDENT_AMBULATORY_CARE_PROVIDER_SITE_OTHER): Payer: BLUE CROSS/BLUE SHIELD | Admitting: Internal Medicine

## 2014-10-13 ENCOUNTER — Encounter: Payer: Self-pay | Admitting: Internal Medicine

## 2014-10-13 VITALS — BP 120/84 | HR 84 | Temp 98.4°F | Wt 176.5 lb

## 2014-10-13 DIAGNOSIS — R0982 Postnasal drip: Secondary | ICD-10-CM | POA: Diagnosis not present

## 2014-10-13 DIAGNOSIS — R0981 Nasal congestion: Secondary | ICD-10-CM

## 2014-10-13 DIAGNOSIS — K146 Glossodynia: Secondary | ICD-10-CM

## 2014-10-13 MED ORDER — FLUTICASONE PROPIONATE 50 MCG/ACT NA SUSP: 2.0000 | Freq: Every day | NASAL | Status: DC

## 2014-10-13 NOTE — Progress Notes (Signed)
Pre visit review using our clinic review tool, if applicable. No additional management support is needed unless otherwise documented below in the visit note. 

## 2014-10-13 NOTE — Progress Notes (Signed)
HPI  Pt. is a 59 y.o. female presenting to clinic due to dry cough, nasal congestion and cuts on her tongue that have been sore for 1 week.  She believes that the dry cough and nasal congestion are due to her chronic sinus drainage. She tried some nasal saline but no improvement. On 1/6, Dr. Clarene Duke - dx. sinus drainage; Claritin, didn't rx any antbx. She didn't see improvement w/ Claritin so used OTC dextromethorphan/guaif/phenylephrine which alleviated symptoms. Tongue feels raw and burns when she eats. No pus or bleeding on tongue. Hasn't eaten any spicy foods or anything sharp/hot. Lips are dry/sore as well. Dental partial. Routine B12 shots. Hx. of anemia when menstruating but now post-menopausal.  Review of Systems      Past Medical History  Diagnosis Date  . Breast cancer 02/27/2007  . Migraine   . Hypercholesterolemia   . Anemia   . Anxiety   . Blood transfusion without reported diagnosis     Family History  Problem Relation Age of Onset  . Thyroid disease Mother   . Hypertension Mother   . Hyperlipidemia Mother   . Alzheimer's disease Mother   . Heart failure Mother   . Esophageal cancer Father   . Hyperlipidemia Father   . Heart murmur Father   . Diabetes Sister   . Alzheimer's disease Maternal Aunt   . Alzheimer's disease Maternal Uncle   . Diabetes Paternal Uncle   . Alzheimer's disease Maternal Grandmother   . Diabetes Paternal Grandmother   . Colon polyps Father   . Colon cancer Neg Hx   . Breast cancer Sister   . Irritable bowel syndrome Sister   . Ulcerative colitis Sister     ????    History   Social History  . Marital Status: Married    Spouse Name: N/A    Number of Children: N/A  . Years of Education: N/A   Occupational History  . Not on file.   Social History Main Topics  . Smoking status: Never Smoker   . Smokeless tobacco: Never Used  . Alcohol Use: No  . Drug Use: No  . Sexual Activity:    Partners: Male    Birth Control/ Protection:  Surgical   Other Topics Concern  . Not on file   Social History Narrative    No Known Allergies   Constitutional: Denies headache, fatigue, fever, or abrupt weight changes.  HEENT:  Positive nasal congestion and PND. Denies eye redness, eye pain, pressure behind the eyes, facial pain, ear pain, ringing in the ears, wax buildup, runny nose or bloody nose. Respiratory: Positive dry cough. Denies difficulty breathing or shortness of breath.  Cardiovascular: Denies chest pain, chest tightness, palpitations or swelling in the hands or feet.   No other specific complaints in a complete review of systems (except as listed in HPI above).  Objective:   BP 120/84 mmHg  Pulse 84  Temp(Src) 98.4 F (36.9 C) (Oral)  Wt 176 lb 8 oz (80.06 kg)  SpO2 97% Wt Readings from Last 3 Encounters:  10/13/14 176 lb 8 oz (80.06 kg)  09/21/14 175 lb 8 oz (79.606 kg)  08/31/14 175 lb (79.379 kg)     General: Appears her stated age, well developed, well nourished in NAD. HEENT: Head: normal shape and size, no sinus tenderness noted; Eyes: sclera white, no icterus, conjunctiva pink; Ears: Tm's gray and intact, normal light reflex; Nose: erythematous mucosa and moist, septum midline; Throat/Mouth: + PND. Teeth present & partial dental  hardware, mucosa erythematous on outer distal edge of tongue, multiple enlarged fissures on tongue; dry gums and lower lip; no exudate, blood, lesions or ulcerations noted.  Neck: No lymphadenopathy, masses, lumps or thyromegaly present.  Cardiovascular: Normal rate and rhythm. S1,S2 noted.  No murmur, rubs or gallops noted.  Pulmonary/Chest: Normal effort and positive vesicular breath sounds. No respiratory distress. No wheezes, rales or ronchi noted.      Assessment & Plan:   Soreness of tongue:  Possibly due to frequent use of OTC cough syrups/dehydration. Stay hydrated Avoid spicy food and sharp/rough food textures Ok to try Biotine Mouth moustrizer if  needed  Nasal congestion/PND/dry cough:  Flonase 50 mcg nasal spray Continue Claritin  RTC as needed or if symptoms persist.

## 2014-10-13 NOTE — Patient Instructions (Signed)

## 2014-10-15 ENCOUNTER — Other Ambulatory Visit: Payer: Self-pay | Admitting: Family Medicine

## 2014-10-31 ENCOUNTER — Telehealth: Payer: Self-pay | Admitting: Family Medicine

## 2014-10-31 NOTE — Telephone Encounter (Signed)
Spoke to patient and was advised that she left a message at the eye doctor for a call back and they have not returned her call. Patient stated that she took some ibuprofen and it has eased the pain. Appointment scheduled for 11/01/14.

## 2014-10-31 NOTE — Telephone Encounter (Signed)
Patient Name: [name] DOB: [birth year] Initial Comment Caller states she has burst blood vessel in eye, has been having intermittent sharp pain from eye to temple Nurse Assessment Nurse: Vallery Sa, RN, [place] Date/Time (Eastern Time): 10/31/2014 12:45:38 PM Confirm and document reason for call. If symptomatic, describe symptoms. ---Caller states that an eye blood vessel burst in her left eye 4 days ago and she developed severe pain in the eye into the temple yesterday. No injury in the past 3 days. No fever. Has the patient traveled out of the country within the last 30 days? ---No Does the patient require triage? ---Yes Related visit to physician within the last 2 weeks? ---No Does the PT have any chronic conditions? (i.e. diabetes, asthma, etc.) ---Yes List chronic conditions. ---Dry Eye, High Cholesterol Guidelines Guideline Title Affirmed Question Affirmed Notes Eye Pain [1] Eye pain/discomfort AND [2] more than mild Final Disposition User See Physician within 4 Hours (or PCP triage) Vallery Sa, RN, Lennox Grumbles plans to call her Eye Doctor to see if they can see her today. She will call back as needed.

## 2014-10-31 NOTE — Telephone Encounter (Signed)
If unable to see eye doctor today, I would have her make an appt tomorrow here

## 2014-11-01 ENCOUNTER — Encounter: Payer: Self-pay | Admitting: Internal Medicine

## 2014-11-01 ENCOUNTER — Ambulatory Visit (INDEPENDENT_AMBULATORY_CARE_PROVIDER_SITE_OTHER): Payer: BLUE CROSS/BLUE SHIELD | Admitting: Internal Medicine

## 2014-11-01 ENCOUNTER — Telehealth: Payer: Self-pay

## 2014-11-01 VITALS — BP 120/84 | HR 91 | Temp 98.1°F | Wt 172.0 lb

## 2014-11-01 DIAGNOSIS — H5712 Ocular pain, left eye: Secondary | ICD-10-CM

## 2014-11-01 DIAGNOSIS — H578 Other specified disorders of eye and adnexa: Secondary | ICD-10-CM

## 2014-11-01 DIAGNOSIS — H5789 Other specified disorders of eye and adnexa: Secondary | ICD-10-CM

## 2014-11-01 MED ORDER — POLYMYXIN B-TRIMETHOPRIM 10000-0.1 UNIT/ML-% OP SOLN: 1.0000 [drp] | OPHTHALMIC | Status: DC

## 2014-11-01 NOTE — Progress Notes (Signed)
Pre visit review using our clinic review tool, if applicable. No additional management support is needed unless otherwise documented below in the visit note. 

## 2014-11-01 NOTE — Patient Instructions (Signed)
Eye Drops Use eye drops as directed. It may be easier to have someone help you put the drops in your eye. If you are alone, use the following instructions to help you.  Wash your hands before putting drops in your eyes.  Read the label and look at your medication. Check for any expiration date that may appear on the bottle or tube. Changes of color may be a warning that the medication is old or ineffective. This is especially true if the medication has become brown in color. If you have questions or concerns, call your caregiver. DROPS  Tilt your head back with the affected eye uppermost. Gently pull down on your lower lid. Do not pull up on the upper lid.  Look up. Place the dropper or bottle just over the edge of the lower lid near the white portion at the bottom of the eye. The goal is to have the drop go into the little sac formed by the lower lid and the bottom of the eye itself. Do not release the drop from a height of several inches over the eye. That will only serve to startle the person receiving the medicine when it lands and forces a blink.  Steady your hand in a comfortable manner. An example would be to hold the dropper or bottle between your thumb and index (pointing) finger. Lean your index finger against the brow.  Then, slowly and gently squeeze one drop of medication into your eye.  Once the medication has been applied, place your finger between the lower eyelid and the nose, pressing firmly against the nose for 5-10 seconds. This will slow the process of the eye drop entering the small canal that normally drains tears into the nose, and therefore increases the exposure of the medicine to the eye for a few extra seconds. OINTMENTS  Look up. Place the tip of the tube just over the edge of the lower lid near the white portion at the bottom of the eye. The goal is to create a line of ointment along the inner surface of the eyelid in the little sac formed by the lower lid and the  bottom of the eye itself.  Avoid touching the tube tip to your eyeball or eyelid. This avoids contamination of the tube or the medicine in the tube.  Once a line of medicine has been created, hold the upper lid up and look down before releasing the upper lid. This will force the ointment to spread over the surface of the eye.  Your vision will be very blurry for a few minutes after applying an ointment properly. This is normal and will clear as you continue to blink. For this reason, it is best to apply ointments just before going to sleep, or at a time when you can rest your eyes for 5-10 minutes after applying the medication. GENERAL  Store your medicine in a cool, dry place after each use.  If you need a second medication, wait at least two minutes. This helps the first medication to be taken up (absorbed) by the eye.  If you have been instructed to use both an eye drop and an eye ointment, always apply the drop first and then the ointment 3-4 minutes afterward. Never put medications into the eye unless the label reads, "For Ophthalmic Use," "For Use In Eyes" or "Eye Drops." If you have questions, call your caregiver. Document Released: 12/09/2000 Document Revised: 01/17/2014 Document Reviewed: 02/14/2009 [facility] Patient Information 2015 [place], [place]. This  information is not intended to replace advice given to you by your health care provider. Make sure you discuss any questions you have with your health care provider.  

## 2014-11-01 NOTE — Telephone Encounter (Signed)
Left detailed msg on VM per HIPAA letting pt know--per Tally Due make a f/u appt with her eye Dr asap to rule out scleritis

## 2014-11-01 NOTE — Progress Notes (Addendum)
Subjective:    Patient ID: [name], female    DOB: October 23, 1955, 59 y.o.   MRN: [id number]  HPI  Pt presents to the clinic today with c/o left eye pain. She reports this started 4 days ago. She reports the pain is sharp at time but sore most of the time. She has had some drainage from the left eye. She denies any changes in her vision or trauma to the eye. She did call her eye doctor and he advised her to wait to see if symptoms resolved. They did improve but they have not resolve.  Review of Systems      Past Medical History  Diagnosis Date  . Breast cancer 02/27/2007  . Migraine   . Hypercholesterolemia   . Anemia   . Anxiety   . Blood transfusion without reported diagnosis     Current Outpatient Prescriptions  Medication Sig Dispense Refill  . citalopram (CELEXA) 20 MG tablet TAKE ONE TABLET BY MOUTH ONE TIME DAILY  30 tablet 4  . cyanocobalamin (,VITAMIN B-12,) 1000 MCG/ML injection Inject 1,000 mcg into the muscle every 30 (thirty) days.    . fluticasone (FLONASE) 50 MCG/ACT nasal spray Place 2 sprays into both nostrils daily. 16 g 6  . hydrochlorothiazide (MICROZIDE) 12.5 MG capsule 1 tab by mouth daily prn swelling 30 capsule 1  . ibuprofen (ADVIL,MOTRIN) 800 MG tablet TAKE ONE TABLET BY MOUTH EVERY 8 HOURS AS NEEDED FOR RELIEF FROM PAIN 60 tablet 3  . Multiple Vitamin (MULTIVITAMIN) tablet Take 1 tablet by mouth daily.    . Multiple Vitamins-Minerals (HAIR/SKIN/NAILS PO) Take 1 capsule by mouth daily.    [name] omeprazole (PRILOSEC) 20 MG capsule Take 1 capsule (20 mg total) by mouth daily. 30 capsule 3  . Probiotic Product (PRO-BIOTIC BLEND PO) Take 1 capsule by mouth daily.    . promethazine (PHENERGAN) 25 MG tablet Take 1 tablet (25 mg total) by mouth every 6 (six) hours as needed for nausea or vomiting. 30 tablet 1  . Red Yeast Rice Extract 500 MG/0.5GM POWD Take by mouth.    . traMADol (ULTRAM) 50 MG tablet TAKE ONE TABLET BY MOUTH EVERY SIX HOURS AS NEEDED  40  tablet 2  . zolmitriptan (ZOMIG) 5 MG tablet Take 1 tablet (5 mg total) by mouth as needed for migraine. 10 tablet 5  . zolpidem (AMBIEN) 10 MG tablet   2   No current facility-administered medications for this visit.    No Known Allergies  Family History  Problem Relation Age of Onset  . Thyroid disease Mother   . Hypertension Mother   . Hyperlipidemia Mother   . Alzheimer's disease Mother   . Heart failure Mother   . Esophageal cancer Father   . Hyperlipidemia Father   . Heart murmur Father   . Diabetes Sister   . Alzheimer's disease Maternal Aunt   . Alzheimer's disease Maternal Uncle   . Diabetes Paternal Uncle   . Alzheimer's disease Maternal Grandmother   . Diabetes Paternal Grandmother   . Colon polyps Father   . Colon cancer Neg Hx   . Breast cancer Sister   . Irritable bowel syndrome Sister   . Ulcerative colitis Sister     ????    History   Social History  . Marital Status: Married    Spouse Name: N/A  . Number of Children: N/A  . Years of Education: N/A   Occupational History  . Not on file.   Social  History Main Topics  . Smoking status: Never Smoker   . Smokeless tobacco: Never Used  . Alcohol Use: No  . Drug Use: No  . Sexual Activity:    Partners: Male    Birth Control/ Protection: Surgical   Other Topics Concern  . Not on file   Social History Narrative     Constitutional: Denies fever, malaise, fatigue, headache or abrupt weight changes.  HEENT: Pt reports eye pain and redness. Denies ear pain, ringing in the ears, wax buildup, runny nose, nasal congestion, bloody nose, or sore throat. Respiratory: Denies difficulty breathing, shortness of breath, cough or sputum production.   Cardiovascular: Denies chest pain, chest tightness, palpitations or swelling in the hands or feet.  Skin: Denies redness, rashes, lesions or ulcercations.  Neurological: Denies dizziness, difficulty with memory, difficulty with speech or problems with balance and  coordination.   No other specific complaints in a complete review of systems (except as listed in HPI above).  Objective:   Physical Exam  BP 120/84 mmHg  Pulse 91  Temp(Src) 98.1 F (36.7 C) (Oral)  Wt 172 lb (78.019 kg)  SpO2 98% Wt Readings from Last 3 Encounters:  11/01/14 172 lb (78.019 kg)  10/13/14 176 lb 8 oz (80.06 kg)  09/21/14 175 lb 8 oz (79.606 kg)    General: Appears her stated age, well developed, well nourished in NAD. Skin: Warm, dry and intact. No rashes, lesions or ulcerations noted. HEENT: Head: normal shape and size; Left Eye: sclera icteric and injected, conjunctiva erythematous, PERRLA and EOMs intact; Right Eye: Normal, no icterus.  Cardiovascular: Normal rate and rhythm. S1,S2 noted.  No murmur, rubs or gallops noted.  Pulmonary/Chest: Normal effort and positive vesicular breath sounds. No respiratory distress. No wheezes, rales or ronchi noted.  Neurological: Alert and oriented.    BMET    Component Value Date/Time   NA 141 08/08/2014 1506   NA 139 10/18/2009 1101   K 3.8 08/08/2014 1506   K 3.9 10/18/2009 1101   CL 103 08/08/2014 1506   CL 101 10/18/2009 1101   CO2 30 08/08/2014 1506   CO2 31 10/18/2009 1101   GLUCOSE 100* 08/08/2014 1506   GLUCOSE 91 10/18/2009 1101   BUN 15 08/08/2014 1506   BUN 12 10/18/2009 1101   CREATININE 0.8 08/08/2014 1506   CREATININE 0.7 10/18/2009 1101   CALCIUM 9.2 08/08/2014 1506   CALCIUM 9.5 10/18/2009 1101    Lipid Panel     Component Value Date/Time   CHOL 238* 05/09/2014 0908   TRIG 179.0* 05/09/2014 0908   HDL 57.10 05/09/2014 0908   CHOLHDL 4 05/09/2014 0908   VLDL 35.8 05/09/2014 0908   LDLCALC 145* 05/09/2014 0908    CBC    Component Value Date/Time   WBC 5.0 03/08/2014 0842   WBC 4.4 10/18/2009 1101   RBC 4.46 03/08/2014 0842   RBC 4.10 10/18/2009 1101   HGB 12.6 03/08/2014 0842   HGB 12.2 10/18/2009 1101   HCT 38.3 03/08/2014 0842   HCT 36.0 10/18/2009 1101   PLT 244.0 03/08/2014  0842   PLT 212 10/18/2009 1101   MCV 85.7 03/08/2014 0842   MCV 88 10/18/2009 1101   MCH 28.5 12/31/2011 1045   MCH 29.7 10/18/2009 1101   MCHC 33.0 03/08/2014 0842   MCHC 33.8 10/18/2009 1101   RDW 13.9 03/08/2014 0842   RDW 11.9 10/18/2009 1101   LYMPHSABS 1.1 03/08/2014 0842   LYMPHSABS 1.3 10/18/2009 1101   MONOABS 0.4 03/08/2014  0842   EOSABS 0.2 03/08/2014 0842   EOSABS 0.1 10/18/2009 1101   BASOSABS 0.0 03/08/2014 0842   BASOSABS 0.0 10/18/2009 1101    Hgb A1C Lab Results  Component Value Date   HGBA1C 6.4 03/08/2014         Assessment & Plan:   Left eye redness and pain:  Does not appear to be subconjunctival hemorrhage eRx for polytrim eye drops Q4h while awake- to treat possible conjunctivitis Warm compresses and Ibuprofen TID for comfort If no improvement of worse by Friday, make a follow up appt with eye doctor  Addendum: After she left, I was looking on UTD. ? If she has scleritis of the left eye. Advised her to go ahead and follow up with her eye doctor today or tomorrow.  RTC as needed or if symptoms persist or worsen

## 2015-02-14 ENCOUNTER — Ambulatory Visit (INDEPENDENT_AMBULATORY_CARE_PROVIDER_SITE_OTHER): Payer: BLUE CROSS/BLUE SHIELD | Admitting: Family Medicine

## 2015-02-16 ENCOUNTER — Encounter: Payer: Self-pay | Admitting: Family Medicine

## 2015-02-16 ENCOUNTER — Ambulatory Visit (INDEPENDENT_AMBULATORY_CARE_PROVIDER_SITE_OTHER): Payer: BLUE CROSS/BLUE SHIELD | Admitting: Family Medicine

## 2015-02-16 VITALS — BP 114/72 | HR 79 | Temp 98.3°F | Wt 160.5 lb

## 2015-02-16 DIAGNOSIS — K589 Irritable bowel syndrome without diarrhea: Secondary | ICD-10-CM

## 2015-02-16 DIAGNOSIS — F329 Major depressive disorder, single episode, unspecified: Secondary | ICD-10-CM | POA: Diagnosis not present

## 2015-02-16 DIAGNOSIS — N951 Menopausal and female climacteric states: Secondary | ICD-10-CM | POA: Diagnosis not present

## 2015-02-16 DIAGNOSIS — F32A Depression, unspecified: Secondary | ICD-10-CM

## 2015-02-16 DIAGNOSIS — E538 Deficiency of other specified B group vitamins: Secondary | ICD-10-CM | POA: Diagnosis not present

## 2015-02-16 DIAGNOSIS — G4700 Insomnia, unspecified: Secondary | ICD-10-CM

## 2015-02-16 LAB — CBC WITH DIFFERENTIAL/PLATELET
Basophils Absolute: 0 10*3/uL (ref 0.0–0.1)
Basophils Relative: 0.2 % (ref 0.0–3.0)
EOS ABS: 0.1 10*3/uL (ref 0.0–0.7)
EOS PCT: 2.9 % (ref 0.0–5.0)
HCT: 38 % (ref 36.0–46.0)
HEMOGLOBIN: 12.4 g/dL (ref 12.0–15.0)
Lymphocytes Relative: 22.7 % (ref 12.0–46.0)
Lymphs Abs: 1.2 10*3/uL (ref 0.7–4.0)
MCHC: 32.7 g/dL (ref 30.0–36.0)
MCV: 85.6 fl (ref 78.0–100.0)
MONOS PCT: 7.4 % (ref 3.0–12.0)
Monocytes Absolute: 0.4 10*3/uL (ref 0.1–1.0)
Neutro Abs: 3.4 10*3/uL (ref 1.4–7.7)
Neutrophils Relative %: 66.8 % (ref 43.0–77.0)
PLATELETS: 236 10*3/uL (ref 150.0–400.0)
RBC: 4.44 Mil/uL (ref 3.87–5.11)
RDW: 13.8 % (ref 11.5–15.5)
WBC: 5.1 10*3/uL (ref 4.0–10.5)

## 2015-02-16 LAB — T4, FREE: Free T4: 0.84 ng/dL (ref 0.60–1.60)

## 2015-02-16 LAB — VITAMIN D 25 HYDROXY (VIT D DEFICIENCY, FRACTURES): VITD: 28.5 ng/mL — ABNORMAL LOW (ref 30.00–100.00)

## 2015-02-16 LAB — TSH: TSH: 1.4 u[IU]/mL (ref 0.35–4.50)

## 2015-02-16 MED ORDER — LINACLOTIDE 145 MCG PO CAPS: 145.0000 ug | ORAL_CAPSULE | Freq: Every day | ORAL | Status: DC

## 2015-02-16 MED ORDER — CYANOCOBALAMIN 1000 MCG/ML IJ SOLN
1000.0000 ug | Freq: Once | INTRAMUSCULAR | Status: AC
  Administered 2015-02-16: 1000 ug via INTRAMUSCULAR

## 2015-02-16 MED ORDER — ESCITALOPRAM OXALATE 10 MG PO TABS: 10.0000 mg | ORAL_TABLET | Freq: Every day | ORAL | Status: DC

## 2015-02-16 NOTE — Patient Instructions (Signed)
Great to see you. To wean off celexa:  1 tablet every other day for 1 week, then 1/2 tab every other day for 1 week. Go ahead and start taking Lexpro 10 mg daily.  Please keep me updated.

## 2015-02-16 NOTE — Progress Notes (Signed)
Pre visit review using our clinic review tool, if applicable. No additional management support is needed unless otherwise documented below in the visit note. 

## 2015-02-16 NOTE — Addendum Note (Signed)
Addended by: Modena Nunnery on: 02/16/2015 11:25 AM   Modules accepted: Orders

## 2015-02-16 NOTE — Assessment & Plan Note (Signed)
Deteriorated along with several other symptoms which do seem consistent with menopausal symptoms. >25 minutes spent in face to face time with patient, >50% spent in counselling or coordination of care concerning insomnia, depression, hot flashes, and constipation. Wean off celexa, start lexapro 10 mg daily. Also check labs today, continue OTC lubricant. She has follow up scheduled in August- she will keep me updated before this follow up. Orders Placed This Encounter  Procedures  . TSH  . T4, Free  . CBC with Differential/Platelet  . Vitamin D, 25-hydroxy

## 2015-02-16 NOTE — Progress Notes (Signed)
Subjective:   Patient ID: [name], female    DOB: 1955/12/26, 59 y.o.   MRN: [id number]  [name] is a pleasant 59 y.o. year old female who presents to clinic today with Follow-up  on 02/16/2015  HPI: ? Depression- she has been on celexa 20 mg daily for years.  Tried to increase dose in past but she felt this actually worsened her mood.   Past few weeks, more tearful and sad "for no reason."  Always has had issues with insomnia- takes prn Lorrin Mais, this is worse lately too.  No SI or HI.  Also has noticed hot flashes which are very similar to hot flashes she had when she was taking Tamoxifen.  Remote h/o hysterectomy.  +vaginal dryness- OTC lubricants do help.  Still having some constipation.  Linzess and probiotics have worked well in the past.  Mom was on thyroid replacement therapy.  Current Outpatient Prescriptions on File Prior to Visit  Medication Sig Dispense Refill  . cyanocobalamin (,VITAMIN B-12,) 1000 MCG/ML injection Inject 1,000 mcg into the muscle every 30 (thirty) days.    . fluticasone (FLONASE) 50 MCG/ACT nasal spray Place 2 sprays into both nostrils daily. 16 g 6  . hydrochlorothiazide (MICROZIDE) 12.5 MG capsule 1 tab by mouth daily prn swelling 30 capsule 1  . ibuprofen (ADVIL,MOTRIN) 800 MG tablet TAKE ONE TABLET BY MOUTH EVERY 8 HOURS AS NEEDED FOR RELIEF FROM PAIN 60 tablet 3  . Multiple Vitamin (MULTIVITAMIN) tablet Take 1 tablet by mouth daily.    . Multiple Vitamins-Minerals (HAIR/SKIN/NAILS PO) Take 1 capsule by mouth daily.    [name] omeprazole (PRILOSEC) 20 MG capsule Take 1 capsule (20 mg total) by mouth daily. 30 capsule 3  . Probiotic Product (PRO-BIOTIC BLEND PO) Take 1 capsule by mouth daily.    . promethazine (PHENERGAN) 25 MG tablet Take 1 tablet (25 mg total) by mouth every 6 (six) hours as needed for nausea or vomiting. 30 tablet 1  . Red Yeast Rice Extract 500 MG/0.5GM POWD Take by mouth.    . traMADol (ULTRAM) 50 MG tablet TAKE ONE TABLET BY  MOUTH EVERY SIX HOURS AS NEEDED  40 tablet 2  . zolmitriptan (ZOMIG) 5 MG tablet Take 1 tablet (5 mg total) by mouth as needed for migraine. 10 tablet 5  . zolpidem (AMBIEN) 10 MG tablet   2   No current facility-administered medications on file prior to visit.    No Known Allergies  Past Medical History  Diagnosis Date  . Breast cancer 02/27/2007  . Migraine   . Hypercholesterolemia   . Anemia   . Anxiety   . Blood transfusion without reported diagnosis     Past Surgical History  Procedure Laterality Date  . Cesarean section      two times  . Abdominal hysterectomy    . Breast surgery  01/14/2013    breast reduction left side  . Foot surgery Right   . Colonoscopy      Family History  Problem Relation Age of Onset  . Thyroid disease Mother   . Hypertension Mother   . Hyperlipidemia Mother   . Alzheimer's disease Mother   . Heart failure Mother   . Esophageal cancer Father   . Hyperlipidemia Father   . Heart murmur Father   . Diabetes Sister   . Alzheimer's disease Maternal Aunt   . Alzheimer's disease Maternal Uncle   . Diabetes Paternal Uncle   . Alzheimer's disease Maternal Grandmother   .  Diabetes Paternal Grandmother   . Colon polyps Father   . Colon cancer Neg Hx   . Breast cancer Sister   . Irritable bowel syndrome Sister   . Ulcerative colitis Sister     ????    History   Social History  . Marital Status: Married    Spouse Name: N/A  . Number of Children: N/A  . Years of Education: N/A   Occupational History  . Not on file.   Social History Main Topics  . Smoking status: Never Smoker   . Smokeless tobacco: Never Used  . Alcohol Use: No  . Drug Use: No  . Sexual Activity:    Partners: Male    Birth Control/ Protection: Surgical   Other Topics Concern  . Not on file   Social History Narrative   The PMH, PSH, Social History, Family History, Medications, and allergies have been reviewed in [facility], and have been updated if  relevant.   Review of Systems  Constitutional: Positive for fatigue. Negative for fever.  HENT: Negative.   Respiratory: Negative.   Cardiovascular: Negative.   Gastrointestinal: Positive for constipation. Negative for nausea, vomiting, abdominal pain, diarrhea, blood in stool, abdominal distention and rectal pain.  Endocrine: Positive for heat intolerance.  Genitourinary: Negative.  Negative for vaginal bleeding, menstrual problem and pelvic pain.  Musculoskeletal: Negative.   Skin: Negative.   Allergic/Immunologic: Negative.   Neurological: Negative.   Psychiatric/Behavioral: Positive for sleep disturbance, dysphoric mood and decreased concentration. Negative for suicidal ideas, behavioral problems, self-injury and agitation. The patient is not nervous/anxious.   All other systems reviewed and are negative.      Objective:    BP 114/72 mmHg  Pulse 79  Temp(Src) 98.3 F (36.8 C) (Oral)  Wt 160 lb 8 oz (72.802 kg)  SpO2 98%   Physical Exam  Constitutional: She is oriented to person, place, and time. She appears well-developed and well-nourished. No distress.  HENT:  Head: Normocephalic.  Eyes: Conjunctivae are normal.  Neck: Normal range of motion.  Cardiovascular: Normal rate.   Pulmonary/Chest: Effort normal.  Abdominal: Soft. Bowel sounds are normal.  Neurological: She is alert and oriented to person, place, and time. No cranial nerve deficit.  Skin: Skin is warm and dry.  Psychiatric: She has a normal mood and affect. Her behavior is normal. Judgment and thought content normal.  Nursing note and vitals reviewed.         Assessment & Plan:   No diagnosis found. No Follow-up on file.

## 2015-02-17 ENCOUNTER — Encounter: Payer: Self-pay | Admitting: Family Medicine

## 2015-03-16 ENCOUNTER — Other Ambulatory Visit: Payer: Self-pay

## 2015-03-16 DIAGNOSIS — Z1231 Encounter for screening mammogram for malignant neoplasm of breast: Secondary | ICD-10-CM

## 2015-04-18 ENCOUNTER — Other Ambulatory Visit: Payer: Self-pay | Admitting: Family Medicine

## 2015-04-18 ENCOUNTER — Encounter: Payer: Self-pay | Admitting: Family Medicine

## 2015-04-18 MED ORDER — ESCITALOPRAM OXALATE 20 MG PO TABS: 20.0000 mg | ORAL_TABLET | Freq: Every day | ORAL | Status: DC

## 2015-04-20 ENCOUNTER — Ambulatory Visit
Admission: RE | Admit: 2015-04-20 | Discharge: 2015-04-20 | Disposition: A | Discharge status: Home / Self Care | Payer: BLUE CROSS/BLUE SHIELD | Source: Ambulatory Visit

## 2015-04-20 DIAGNOSIS — Z1231 Encounter for screening mammogram for malignant neoplasm of breast: Secondary | ICD-10-CM

## 2015-05-03 ENCOUNTER — Other Ambulatory Visit: Payer: Self-pay | Admitting: Family Medicine

## 2015-05-03 DIAGNOSIS — E785 Hyperlipidemia, unspecified: Secondary | ICD-10-CM

## 2015-05-03 DIAGNOSIS — E538 Deficiency of other specified B group vitamins: Secondary | ICD-10-CM

## 2015-05-03 DIAGNOSIS — Z01419 Encounter for gynecological examination (general) (routine) without abnormal findings: Secondary | ICD-10-CM | POA: Insufficient documentation

## 2015-05-09 ENCOUNTER — Other Ambulatory Visit: Payer: BLUE CROSS/BLUE SHIELD

## 2015-05-10 ENCOUNTER — Other Ambulatory Visit (INDEPENDENT_AMBULATORY_CARE_PROVIDER_SITE_OTHER): Payer: BLUE CROSS/BLUE SHIELD

## 2015-05-10 DIAGNOSIS — Z0000 Encounter for general adult medical examination without abnormal findings: Secondary | ICD-10-CM

## 2015-05-10 DIAGNOSIS — E538 Deficiency of other specified B group vitamins: Secondary | ICD-10-CM

## 2015-05-10 DIAGNOSIS — E785 Hyperlipidemia, unspecified: Secondary | ICD-10-CM

## 2015-05-10 DIAGNOSIS — Z01419 Encounter for gynecological examination (general) (routine) without abnormal findings: Secondary | ICD-10-CM

## 2015-05-10 LAB — COMPREHENSIVE METABOLIC PANEL
ALK PHOS: 58 U/L (ref 39–117)
ALT: 15 U/L (ref 0–35)
AST: 20 U/L (ref 0–37)
Albumin: 4.4 g/dL (ref 3.5–5.2)
BUN: 14 mg/dL (ref 6–23)
CO2: 32 meq/L (ref 19–32)
Calcium: 9.5 mg/dL (ref 8.4–10.5)
Chloride: 100 mEq/L (ref 96–112)
Creatinine, Ser: 0.75 mg/dL (ref 0.40–1.20)
GFR: 84.15 mL/min (ref >60.00)
GLUCOSE: 101 mg/dL — AB (ref 70–99)
POTASSIUM: 4 meq/L (ref 3.5–5.1)
SODIUM: 140 meq/L (ref 135–145)
TOTAL PROTEIN: 7.7 g/dL (ref 6.0–8.3)
Total Bilirubin: 0.4 mg/dL (ref 0.2–1.2)

## 2015-05-10 LAB — CBC WITH DIFFERENTIAL/PLATELET
BASOS ABS: 0 10*3/uL (ref 0.0–0.1)
Basophils Relative: 0.5 % (ref 0.0–3.0)
EOS PCT: 2.7 % (ref 0.0–5.0)
Eosinophils Absolute: 0.1 10*3/uL (ref 0.0–0.7)
HCT: 40.6 % (ref 36.0–46.0)
Hemoglobin: 13.4 g/dL (ref 12.0–15.0)
LYMPHS ABS: 1.3 10*3/uL (ref 0.7–4.0)
Lymphocytes Relative: 25.8 % (ref 12.0–46.0)
MCHC: 33 g/dL (ref 30.0–36.0)
MCV: 86 fl (ref 78.0–100.0)
MONO ABS: 0.3 10*3/uL (ref 0.1–1.0)
Monocytes Relative: 7 % (ref 3.0–12.0)
NEUTROS ABS: 3.2 10*3/uL (ref 1.4–7.7)
NEUTROS PCT: 64 % (ref 43.0–77.0)
PLATELETS: 244 10*3/uL (ref 150.0–400.0)
RBC: 4.73 Mil/uL (ref 3.87–5.11)
RDW: 13.9 % (ref 11.5–15.5)
WBC: 5 10*3/uL (ref 4.0–10.5)

## 2015-05-10 LAB — TSH: TSH: 2.13 u[IU]/mL (ref 0.35–4.50)

## 2015-05-10 LAB — VITAMIN B12: Vitamin B-12: 544 pg/mL (ref 211–911)

## 2015-05-10 LAB — LIPID PANEL
CHOL/HDL RATIO: 5
Cholesterol: 274 mg/dL — ABNORMAL HIGH (ref 0–200)
HDL: 59.6 mg/dL (ref >39.00)
LDL CALC: 184 mg/dL — AB (ref 0–99)
NONHDL: 213.96
Triglycerides: 152 mg/dL — ABNORMAL HIGH (ref 0.0–149.0)
VLDL: 30.4 mg/dL (ref 0.0–40.0)

## 2015-05-15 ENCOUNTER — Encounter: Payer: Self-pay | Admitting: Family Medicine

## 2015-05-15 ENCOUNTER — Ambulatory Visit (INDEPENDENT_AMBULATORY_CARE_PROVIDER_SITE_OTHER): Payer: BLUE CROSS/BLUE SHIELD | Admitting: Family Medicine

## 2015-05-15 VITALS — BP 134/72 | HR 80 | Temp 98.1°F | Ht 64.75 in | Wt 157.5 lb

## 2015-05-15 DIAGNOSIS — C50911 Malignant neoplasm of unspecified site of right female breast: Secondary | ICD-10-CM

## 2015-05-15 DIAGNOSIS — F419 Anxiety disorder, unspecified: Secondary | ICD-10-CM

## 2015-05-15 DIAGNOSIS — Z0000 Encounter for general adult medical examination without abnormal findings: Secondary | ICD-10-CM

## 2015-05-15 DIAGNOSIS — Z01419 Encounter for gynecological examination (general) (routine) without abnormal findings: Secondary | ICD-10-CM

## 2015-05-15 DIAGNOSIS — F32A Depression, unspecified: Secondary | ICD-10-CM

## 2015-05-15 DIAGNOSIS — E538 Deficiency of other specified B group vitamins: Secondary | ICD-10-CM

## 2015-05-15 DIAGNOSIS — G43009 Migraine without aura, not intractable, without status migrainosus: Secondary | ICD-10-CM

## 2015-05-15 DIAGNOSIS — F329 Major depressive disorder, single episode, unspecified: Secondary | ICD-10-CM

## 2015-05-15 DIAGNOSIS — Z23 Encounter for immunization: Secondary | ICD-10-CM

## 2015-05-15 DIAGNOSIS — E785 Hyperlipidemia, unspecified: Secondary | ICD-10-CM

## 2015-05-15 MED ORDER — OMEPRAZOLE 20 MG PO CPDR: 20.0000 mg | DELAYED_RELEASE_CAPSULE | Freq: Every day | ORAL | Status: DC

## 2015-05-15 MED ORDER — ESCITALOPRAM OXALATE 20 MG PO TABS: 20.0000 mg | ORAL_TABLET | Freq: Every day | ORAL | Status: DC

## 2015-05-15 MED ORDER — HYDROCHLOROTHIAZIDE 12.5 MG PO CAPS: ORAL_CAPSULE | ORAL | Status: DC

## 2015-05-15 MED ORDER — PRAVASTATIN SODIUM 20 MG PO TABS: 20.0000 mg | ORAL_TABLET | Freq: Every day | ORAL | Status: DC

## 2015-05-15 MED ORDER — CYANOCOBALAMIN 1000 MCG/ML IJ SOLN
1000.0000 ug | Freq: Once | INTRAMUSCULAR | Status: AC
  Administered 2015-05-15: 1000 ug via INTRAMUSCULAR

## 2015-05-15 NOTE — Addendum Note (Signed)
Addended by: Modena Nunnery on: 05/15/2015 10:01 AM   Modules accepted: Orders

## 2015-05-15 NOTE — Assessment & Plan Note (Signed)
Mammogram UTD. 

## 2015-05-15 NOTE — Progress Notes (Signed)
Subjective:   Patient ID: [name], female    DOB: 01-23-1956, 59 y.o.   MRN: [id number]  [name] is a pleasant 59 y.o. year old female who presents to clinic today with Annual Exam and follow up of chronic medical conditions on 05/15/2015  HPI:  Remote h/o hysterectomy Colonoscopy 02/2009 Mammogram 04/20/15 Tdap 08/31/13  Depression- when I saw her in 02/2015, symptoms were not well controlled on celexa which she had been taking for years.  She wanted to wean off celexa and start another SSRI.  Lexapro 10 mg daily started at that time for depression and post menopausal symptoms.  Tried 20 mg daily but couldn't tolerate side effects.  Symptoms much improved on 10 mg daily.  No anxiety or mood swings.  Hot flashes a little better.  HLD- LDL very high this month.  Could not tolerate Crestor or Zocor.  Both caused muscle aches.  Lab Results  Component Value Date   CHOL 274* 05/10/2015   HDL 59.60 05/10/2015   LDLCALC 184* 05/10/2015   LDLDIRECT 208.2 08/31/2013   TRIG 152.0* 05/10/2015   CHOLHDL 5 05/10/2015    Lab Results  Component Value Date   WBC 5.0 05/10/2015   HGB 13.4 05/10/2015   HCT 40.6 05/10/2015   MCV 86.0 05/10/2015   PLT 244.0 05/10/2015   Lab Results  Component Value Date   TSH 2.13 05/10/2015   Lab Results  Component Value Date   ALT 15 05/10/2015   AST 20 05/10/2015   ALKPHOS 58 05/10/2015   BILITOT 0.4 05/10/2015    Lab Results  Component Value Date   CREATININE 0.75 05/10/2015   Current Outpatient Prescriptions on File Prior to Visit  Medication Sig Dispense Refill  . cyanocobalamin (,VITAMIN B-12,) 1000 MCG/ML injection Inject 1,000 mcg into the muscle every 30 (thirty) days.    [name] escitalopram (LEXAPRO) 20 MG tablet Take 1 tablet (20 mg total) by mouth at bedtime. (Patient taking differently: Take 10 mg by mouth at bedtime. ) 30 tablet 0  . fluticasone (FLONASE) 50 MCG/ACT nasal spray Place 2 sprays into both nostrils daily. 16 g 6    . hydrochlorothiazide (MICROZIDE) 12.5 MG capsule 1 tab by mouth daily prn swelling 30 capsule 1  . ibuprofen (ADVIL,MOTRIN) 800 MG tablet TAKE ONE TABLET BY MOUTH EVERY 8 HOURS AS NEEDED FOR RELIEF FROM PAIN 60 tablet 3  . Linaclotide (LINZESS) 145 MCG CAPS capsule Take 1 capsule (145 mcg total) by mouth daily. 30 capsule 3  . Multiple Vitamin (MULTIVITAMIN) tablet Take 1 tablet by mouth daily.    . Multiple Vitamins-Minerals (HAIR/SKIN/NAILS PO) Take 1 capsule by mouth daily.    [name] omeprazole (PRILOSEC) 20 MG capsule Take 1 capsule (20 mg total) by mouth daily. 30 capsule 3  . Probiotic Product (PRO-BIOTIC BLEND PO) Take 1 capsule by mouth daily.    . promethazine (PHENERGAN) 25 MG tablet Take 1 tablet (25 mg total) by mouth every 6 (six) hours as needed for nausea or vomiting. 30 tablet 1  . Red Yeast Rice Extract 500 MG/0.5GM POWD Take by mouth.    . traMADol (ULTRAM) 50 MG tablet TAKE ONE TABLET BY MOUTH EVERY SIX HOURS AS NEEDED  40 tablet 2  . zolmitriptan (ZOMIG) 5 MG tablet Take 1 tablet (5 mg total) by mouth as needed for migraine. 10 tablet 5   No current facility-administered medications on file prior to visit.    No Known Allergies  Past Medical History  Diagnosis Date  . Breast cancer 02/27/2007  . Migraine   . Hypercholesterolemia   . Anemia   . Anxiety   . Blood transfusion without reported diagnosis     Past Surgical History  Procedure Laterality Date  . Cesarean section      two times  . Abdominal hysterectomy    . Breast surgery  01/14/2013    breast reduction left side  . Foot surgery Right   . Colonoscopy      Family History  Problem Relation Age of Onset  . Thyroid disease Mother   . Hypertension Mother   . Hyperlipidemia Mother   . Alzheimer's disease Mother   . Heart failure Mother   . Esophageal cancer Father   . Hyperlipidemia Father   . Heart murmur Father   . Diabetes Sister   . Alzheimer's disease Maternal Aunt   . Alzheimer's disease  Maternal Uncle   . Diabetes Paternal Uncle   . Alzheimer's disease Maternal Grandmother   . Diabetes Paternal Grandmother   . Colon polyps Father   . Colon cancer Neg Hx   . Breast cancer Sister   . Irritable bowel syndrome Sister   . Ulcerative colitis Sister     ????    Social History   Social History  . Marital Status: Married    Spouse Name: N/A  . Number of Children: N/A  . Years of Education: N/A   Occupational History  . Not on file.   Social History Main Topics  . Smoking status: Never Smoker   . Smokeless tobacco: Never Used  . Alcohol Use: No  . Drug Use: No  . Sexual Activity:    Partners: Male    Birth Control/ Protection: Surgical   Other Topics Concern  . Not on file   Social History Narrative   The PMH, PSH, Social History, Family History, Medications, and allergies have been reviewed in [facility], and have been updated if relevant.    Review of Systems  Constitutional: Negative.   HENT: Negative.   Respiratory: Negative.   Cardiovascular: Negative.   Gastrointestinal: Negative.   Endocrine: Negative.   Genitourinary: Negative.   Musculoskeletal: Negative.   Skin: Negative.   Allergic/Immunologic: Negative.   Neurological: Negative.   Hematological: Negative.   Psychiatric/Behavioral: Negative.   All other systems reviewed and are negative.      Objective:    BP 134/72 mmHg  Pulse 80  Temp(Src) 98.1 F (36.7 C) (Oral)  Ht 5' 4.75" (1.645 m)  Wt 157 lb 8 oz (71.442 kg)  BMI 26.40 kg/m2  SpO2 95%   Physical Exam   General:  Well-developed,well-nourished,in no acute distress; alert,appropriate and cooperative throughout examination Head:  normocephalic and atraumatic.   Eyes:  vision grossly intact, pupils equal, pupils round, and pupils reactive to light.   Ears:  R ear normal and L ear normal.   Nose:  no external deformity.   Mouth:  good dentition.   Neck:  No deformities, masses, or tenderness noted. Breasts:  No mass,  nodules, thickening, tenderness, bulging, retraction, inflamation, nipple discharge or skin changes noted.   Lungs:  Normal respiratory effort, chest expands symmetrically. Lungs are clear to auscultation, no crackles or wheezes. Heart:  Normal rate and regular rhythm. S1 and S2 normal without gallop, murmur, click, rub or other extra sounds. Abdomen:  Bowel sounds positive,abdomen soft and non-tender without masses, organomegaly or hernias noted. Rectal:  no external abnormalities.   Genitalia:  Pelvic Exam:  External: normal female genitalia without lesions or masses        Vagina: normal without lesions or masses              Adnexa: normal bimanual exam without masses or fullness        Uterus: absent Msk:  No deformity or scoliosis noted of thoracic or lumbar spine.   Extremities:  No clubbing, cyanosis, edema, or deformity noted with normal full range of motion of all joints.   Neurologic:  alert & oriented X3 and gait normal.   Skin:  Intact without suspicious lesions or rashes Cervical Nodes:  No lymphadenopathy noted Axillary Nodes:  No palpable lymphadenopathy Psych:  Cognition and judgment appear intact. Alert and cooperative with normal attention span and concentration. No apparent delusions, illusions, hallucinations       Assessment & Plan:   Well woman exam  Vitamin B 12 deficiency  HLD (hyperlipidemia)  Anxiety  Depression No Follow-up on file.

## 2015-05-15 NOTE — Assessment & Plan Note (Addendum)
Reviewed preventive care protocols, scheduled due services, and updated immunizations Discussed nutrition, exercise, diet, and healthy lifestyle.  Influenza vaccine given today.  Remote h/o hysterectomy. BME

## 2015-05-15 NOTE — Assessment & Plan Note (Addendum)
Deteriorated. Trial of pravachol 20 mg daily. Repeat lipid panel and CMET in 4-8 weeks- labs ordered.

## 2015-05-15 NOTE — Patient Instructions (Signed)
Great to see you. We are starting Pravachol 20 mg daily.  Please schedule labs in 4- 8 weeks.

## 2015-05-15 NOTE — Assessment & Plan Note (Signed)
IM b12 today. 

## 2015-05-15 NOTE — Progress Notes (Signed)
Pre visit review using our clinic review tool, if applicable. No additional management support is needed unless otherwise documented below in the visit note. 

## 2015-05-15 NOTE — Assessment & Plan Note (Signed)
Stable on current dose of lexapro. No changes made today.

## 2015-06-15 ENCOUNTER — Encounter: Payer: Self-pay | Admitting: Family Medicine

## 2015-06-15 ENCOUNTER — Other Ambulatory Visit (INDEPENDENT_AMBULATORY_CARE_PROVIDER_SITE_OTHER): Payer: BLUE CROSS/BLUE SHIELD

## 2015-06-15 ENCOUNTER — Other Ambulatory Visit: Payer: BLUE CROSS/BLUE SHIELD

## 2015-06-15 ENCOUNTER — Ambulatory Visit (INDEPENDENT_AMBULATORY_CARE_PROVIDER_SITE_OTHER)
Admission: RE | Admit: 2015-06-15 | Discharge: 2015-06-15 | Disposition: A | Discharge status: Home / Self Care | Payer: BLUE CROSS/BLUE SHIELD | Source: Ambulatory Visit | Attending: Family Medicine | Admitting: Family Medicine

## 2015-06-15 ENCOUNTER — Ambulatory Visit (INDEPENDENT_AMBULATORY_CARE_PROVIDER_SITE_OTHER): Payer: BLUE CROSS/BLUE SHIELD | Admitting: Family Medicine

## 2015-06-15 ENCOUNTER — Encounter: Payer: Self-pay | Admitting: *Deleted

## 2015-06-15 VITALS — BP 133/81 | HR 74 | Temp 98.8°F | Ht 64.75 in | Wt 156.5 lb

## 2015-06-15 DIAGNOSIS — M79672 Pain in left foot: Secondary | ICD-10-CM | POA: Diagnosis not present

## 2015-06-15 DIAGNOSIS — E785 Hyperlipidemia, unspecified: Secondary | ICD-10-CM | POA: Diagnosis not present

## 2015-06-15 LAB — COMPREHENSIVE METABOLIC PANEL
ALK PHOS: 59 U/L (ref 39–117)
ALT: 20 U/L (ref 0–35)
AST: 22 U/L (ref 0–37)
Albumin: 4.3 g/dL (ref 3.5–5.2)
BILIRUBIN TOTAL: 0.4 mg/dL (ref 0.2–1.2)
BUN: 16 mg/dL (ref 6–23)
CALCIUM: 9.5 mg/dL (ref 8.4–10.5)
CO2: 34 meq/L — AB (ref 19–32)
Chloride: 102 mEq/L (ref 96–112)
Creatinine, Ser: 0.71 mg/dL (ref 0.40–1.20)
GFR: 89.62 mL/min (ref >60.00)
Glucose, Bld: 96 mg/dL (ref 70–99)
POTASSIUM: 4.1 meq/L (ref 3.5–5.1)
Sodium: 141 mEq/L (ref 135–145)
Total Protein: 7.4 g/dL (ref 6.0–8.3)

## 2015-06-15 LAB — LIPID PANEL
CHOL/HDL RATIO: 3
Cholesterol: 201 mg/dL — ABNORMAL HIGH (ref 0–200)
HDL: 60.9 mg/dL (ref >39.00)
LDL Cholesterol: 119 mg/dL — ABNORMAL HIGH (ref 0–99)
NonHDL: 140.07
TRIGLYCERIDES: 107 mg/dL (ref 0.0–149.0)
VLDL: 21.4 mg/dL (ref 0.0–40.0)

## 2015-06-15 NOTE — Progress Notes (Signed)
Pre visit review using our clinic review tool, if applicable. No additional management support is needed unless otherwise documented below in the visit note. 

## 2015-06-15 NOTE — Progress Notes (Signed)
Dr. Frederico Hamman T. Copland, MD, [facility] [place], [address] Phone: [phone] Fax: [phone]  06/15/2015  Patient: [name], MRN: [id number], DOB: 1956/08/14, 59 y.o.  Primary Physician:  Arnette Norris, MD  Chief Complaint: Foot Pain  Subjective:   [name] is a 59 y.o. very pleasant female patient who presents with the following:  L foot, a couple of years ago and went and bothered her a little while. At the beach about a month ago and pain since then. Pain with walking. She reports to distinct injuries, first 2 years ago, and then a little bit over a month ago. She stepped down and injured her foot and it did swell up and have some mild amount of bruising. Less so one month ago prior, when she was walking at the beach she injured her foot. Since then has been hurting having some swelling laterally.  Past Medical History, Surgical History, Social History, Family History, Problem List, Medications, and Allergies have been reviewed and updated if relevant.  Patient Active Problem List   Diagnosis Date Noted  . Well woman exam 05/03/2015  . Menopausal symptoms 02/16/2015  . Vitamin B 12 deficiency 04/11/2014  . Other malaise and fatigue 03/08/2014  . Stress incontinence 08/31/2013  . Cervico-occipital neuralgia 06/01/2013  . IBS (irritable bowel syndrome) 03/30/2013  . HLD (hyperlipidemia) 03/09/2012  . Migraine 12/31/2011  . Anxiety 12/31/2011  . Depression 12/31/2011  . Insomnia 12/31/2011  . Breast cancer 12/31/2011    Past Medical History  Diagnosis Date  . Breast cancer 02/27/2007  . Migraine   . Hypercholesterolemia   . Anemia   . Anxiety   . Blood transfusion without reported diagnosis     Past Surgical History  Procedure Laterality Date  . Cesarean section      two times  . Abdominal hysterectomy    . Breast surgery  01/14/2013    breast reduction left side  . Foot surgery Right     . Colonoscopy      Social History   Social History  . Marital Status: Married    Spouse Name: N/A  . Number of Children: N/A  . Years of Education: N/A   Occupational History  . Not on file.   Social History Main Topics  . Smoking status: Never Smoker   . Smokeless tobacco: Never Used  . Alcohol Use: No  . Drug Use: No  . Sexual Activity:    Partners: Male    Birth Control/ Protection: Surgical   Other Topics Concern  . Not on file   Social History Narrative    Family History  Problem Relation Age of Onset  . Thyroid disease Mother   . Hypertension Mother   . Hyperlipidemia Mother   . Alzheimer's disease Mother   . Heart failure Mother   . Esophageal cancer Father   . Hyperlipidemia Father   . Heart murmur Father   . Diabetes Sister   . Alzheimer's disease Maternal Aunt   . Alzheimer's disease Maternal Uncle   . Diabetes Paternal Uncle   . Alzheimer's disease Maternal Grandmother   . Diabetes Paternal Grandmother   . Colon polyps Father   . Colon cancer Neg Hx   . Breast cancer Sister   . Irritable bowel syndrome Sister   . Ulcerative colitis Sister     ????    Allergies  Allergen Reactions  . Statins Other (See Comments)  Muscle aches    Medication list reviewed and updated in full in [facility].  GEN: No fevers, chills. Nontoxic. Primarily MSK c/o today. MSK: Detailed in the HPI GI: tolerating PO intake without difficulty Neuro: No numbness, parasthesias, or tingling associated. Otherwise the pertinent positives of the ROS are noted above.   Objective:   BP 133/81 mmHg  Pulse 74  Temp(Src) 98.8 F (37.1 C) (Oral)  Ht 5' 4.75" (1.645 m)  Wt 156 lb 8 oz (70.988 kg)  BMI 26.23 kg/m2   GEN: WDWN, NAD, Non-toxic, Alert & Oriented x 3 HEENT: Atraumatic, Normocephalic.  Ears and Nose: No external deformity. EXTR: No clubbing/cyanosis/edema NEURO: Normal gait, antalgia PSYCH: Normally interactive. Conversant. Not depressed or  anxious appearing.  Calm demeanor.    Left foot: Mild decreased range of motion, notably at the great toe compared to baseline, but mild pain and loss of motion of other toes at the and DP joints as well. Nontender along the metatarsals. Nontender at the navicular. Nontender at the bases fifth metatarsal. Proximally 1 centimeter posterior to the base of the fifth metatarsal, the patient does have maximal tenderness and pain.  Strength is preserved. Normal 2 plus pulses DP and PT. Stable anterior drawer in subtalar tilt testing.  Radiology: Dg Foot Complete Left  06/15/2015   CLINICAL DATA:  Left foot pain 1 month.  EXAM: LEFT FOOT - COMPLETE 3+ VIEW  COMPARISON:  04/30/2013  FINDINGS: There is no evidence of fracture or dislocation. There is no evidence of arthropathy or other focal bone abnormality. Soft tissues are unremarkable.  IMPRESSION: Negative.   Electronically Signed   By: Marin Olp M.D.   On: 06/15/2015 12:03    The radiological images were independently reviewed by myself in the office and results were reviewed with the patient. My independent interpretation of images:  Adjacent to the cuboid, the patient does have a fairly large piece of radio opaque matter consistent with bone. Given clinical history, suspect that this is an old fracture fragment more likely. Secondary ossification center cannot fully be excluded. Electronically Signed  By: [name], MD On: 06/15/2015 1:56 PM    I do not have a full set of L foot xrays for comparison, incomplete 2014 films reviewed.   Assessment and Plan:   Left foot pain - Plan: DG Foot Complete Left  Patient may have had an old fracture 2 years ago laterally vs tendinopathy with pain at secondary ossification Center adjacent to bone and tendon.  Maximally tender at this location. Recommended conservative treatment with immobilization limitation of motion at the ankle and limitation of plantar flexion for 1 month with subsequent  reevaluation. I'm going to place the patient in a short cam walker boot and reassess in 1 month.  Follow-up: Return in about 4 weeks (around 07/13/2015).  New Prescriptions   No medications on file   Orders Placed This Encounter  Procedures  . DG Foot Complete Left    Signed,  Spencer T. Copland, MD   Patient's Medications  New Prescriptions   No medications on file  Previous Medications   CYANOCOBALAMIN (,VITAMIN B-12,) 1000 MCG/ML INJECTION    Inject 1,000 mcg into the muscle every 30 (thirty) days.   ESCITALOPRAM (LEXAPRO) 10 MG TABLET    Take 5 mg by mouth daily.   FLUTICASONE (FLONASE) 50 MCG/ACT NASAL SPRAY    Place 2 sprays into both nostrils daily.   HYDROCHLOROTHIAZIDE (MICROZIDE) 12.5 MG CAPSULE    1 tab by  mouth daily prn swelling   IBUPROFEN (ADVIL,MOTRIN) 800 MG TABLET    TAKE ONE TABLET BY MOUTH EVERY 8 HOURS AS NEEDED FOR RELIEF FROM PAIN   MULTIPLE VITAMIN (MULTIVITAMIN) TABLET    Take 1 tablet by mouth daily.   MULTIPLE VITAMINS-MINERALS (HAIR/SKIN/NAILS PO)    Take 1 capsule by mouth daily.   OMEPRAZOLE (PRILOSEC) 20 MG CAPSULE    Take 1 capsule (20 mg total) by mouth daily.   PRAVASTATIN (PRAVACHOL) 20 MG TABLET    Take 1 tablet (20 mg total) by mouth daily.   PROBIOTIC PRODUCT (PRO-BIOTIC BLEND PO)    Take 1 capsule by mouth daily.   PROMETHAZINE (PHENERGAN) 25 MG TABLET    Take 1 tablet (25 mg total) by mouth every 6 (six) hours as needed for nausea or vomiting.   TRAMADOL (ULTRAM) 50 MG TABLET    TAKE ONE TABLET BY MOUTH EVERY SIX HOURS AS NEEDED    ZOLMITRIPTAN (ZOMIG) 5 MG TABLET    Take 1 tablet (5 mg total) by mouth as needed for migraine.  Modified Medications   No medications on file  Discontinued Medications   ESCITALOPRAM (LEXAPRO) 20 MG TABLET    Take 1 tablet (20 mg total) by mouth at bedtime.   LINACLOTIDE (LINZESS) 145 MCG CAPS CAPSULE    Take 1 capsule (145 mcg total) by mouth daily.   RED YEAST RICE EXTRACT 500 MG/0.5GM POWD    Take by mouth.

## 2015-06-19 ENCOUNTER — Telehealth: Payer: Self-pay | Admitting: *Deleted

## 2015-06-19 NOTE — Telephone Encounter (Signed)
Received medication refill request for Ambien from pharmacy.  Called pt informed her that last visit was 04/2014 and she was due to make an appt with Santiago Glad for follow-up.  Pt acknowledged and will call back to schedule appt.

## 2015-06-20 ENCOUNTER — Encounter: Payer: Self-pay | Admitting: Family Medicine

## 2015-06-24 ENCOUNTER — Encounter: Payer: Self-pay | Admitting: Family Medicine

## 2015-07-13 ENCOUNTER — Encounter: Payer: Self-pay | Admitting: Family Medicine

## 2015-07-13 ENCOUNTER — Ambulatory Visit (INDEPENDENT_AMBULATORY_CARE_PROVIDER_SITE_OTHER): Payer: BLUE CROSS/BLUE SHIELD | Admitting: Family Medicine

## 2015-07-13 VITALS — BP 120/80 | HR 89 | Temp 98.3°F | Ht 64.75 in | Wt 162.5 lb

## 2015-07-13 DIAGNOSIS — E538 Deficiency of other specified B group vitamins: Secondary | ICD-10-CM | POA: Diagnosis not present

## 2015-07-13 DIAGNOSIS — M79672 Pain in left foot: Secondary | ICD-10-CM

## 2015-07-13 DIAGNOSIS — M7672 Peroneal tendinitis, left leg: Secondary | ICD-10-CM

## 2015-07-13 MED ORDER — CYANOCOBALAMIN 1000 MCG/ML IJ SOLN
1000.0000 ug | Freq: Once | INTRAMUSCULAR | Status: AC
  Administered 2015-07-13: 1000 ug via INTRAMUSCULAR

## 2015-07-13 NOTE — Patient Instructions (Signed)
Transition to out of the CAM boot.   Then wear the lace up - for the next few weeks.

## 2015-07-13 NOTE — Progress Notes (Signed)
Dr. Frederico Hamman T. Silus Lanzo, MD, [facility] and Sports Medicine [place], [address] Phone: [phone] Fax: [phone]  07/13/2015  Patient: [name], MRN: [id number], DOB: 1956-04-14, 59 y.o.  Primary Physician:  Arnette Norris, MD  Chief Complaint: Follow-up  Subjective:   [name] is a 59 y.o. very pleasant female patient who presents with the following:  1 mo f/u:  Bone adjacent to cuboid on the L, ? Old healed fx vs accessory growth remnant, pain, imm x 1 mo in short CAM. Feeling better, less pain, but she is not back to baseline.   06/15/2015 Last OV with [name], MD  L foot, a couple of years ago and went and bothered her a little while. At the beach about a month ago and pain since then. Pain with walking. She reports to distinct injuries, first 2 years ago, and then a little bit over a month ago. She stepped down and injured her foot and it did swell up and have some mild amount of bruising. Less so one month ago prior, when she was walking at the beach she injured her foot. Since then has been hurting having some swelling laterally.  Past Medical History, Surgical History, Social History, Family History, Problem List, Medications, and Allergies have been reviewed and updated if relevant.  Patient Active Problem List   Diagnosis Date Noted  . Well woman exam 05/03/2015  . Menopausal symptoms 02/16/2015  . Vitamin B 12 deficiency 04/11/2014  . Other malaise and fatigue 03/08/2014  . Stress incontinence 08/31/2013  . Cervico-occipital neuralgia 06/01/2013  . IBS (irritable bowel syndrome) 03/30/2013  . HLD (hyperlipidemia) 03/09/2012  . Migraine 12/31/2011  . Anxiety 12/31/2011  . Depression 12/31/2011  . Insomnia 12/31/2011  . Breast cancer ([place]) 12/31/2011    Past Medical History  Diagnosis Date  . Breast cancer ([place]) 02/27/2007  . Migraine   . Hypercholesterolemia   . Anemia   . Anxiety   . Blood  transfusion without reported diagnosis     Past Surgical History  Procedure Laterality Date  . Cesarean section      two times  . Abdominal hysterectomy    . Breast surgery  01/14/2013    breast reduction left side  . Foot surgery Right   . Colonoscopy      Social History   Social History  . Marital Status: Married    Spouse Name: N/A  . Number of Children: N/A  . Years of Education: N/A   Occupational History  . Not on file.   Social History Main Topics  . Smoking status: Never Smoker   . Smokeless tobacco: Never Used  . Alcohol Use: No  . Drug Use: No  . Sexual Activity:    Partners: Male    Birth Control/ Protection: Surgical   Other Topics Concern  . Not on file   Social History Narrative    Family History  Problem Relation Age of Onset  . Thyroid disease Mother   . Hypertension Mother   . Hyperlipidemia Mother   . Alzheimer's disease Mother   . Heart failure Mother   . Esophageal cancer Father   . Hyperlipidemia Father   . Heart murmur Father   . Diabetes Sister   . Alzheimer's disease Maternal Aunt   . Alzheimer's disease Maternal Uncle   . Diabetes Paternal Uncle   . Alzheimer's disease Maternal Grandmother   . Diabetes Paternal Grandmother   .  Colon polyps Father   . Colon cancer Neg Hx   . Breast cancer Sister   . Irritable bowel syndrome Sister   . Ulcerative colitis Sister     ????    Allergies  Allergen Reactions  . Statins Other (See Comments)    Muscle aches    Medication list reviewed and updated in full in [facility].  GEN: No fevers, chills. Nontoxic. Primarily MSK c/o today. MSK: Detailed in the HPI GI: tolerating PO intake without difficulty Neuro: No numbness, parasthesias, or tingling associated. Otherwise the pertinent positives of the ROS are noted above.   Objective:   BP 120/80 mmHg  Pulse 89  Temp(Src) 98.3 F (36.8 C) (Oral)  Ht 5' 4.75" (1.645 m)  Wt 162 lb 8 oz (73.71 kg)  BMI 27.24  kg/m2   GEN: WDWN, NAD, Non-toxic, Alert & Oriented x 3 HEENT: Atraumatic, Normocephalic.  Ears and Nose: No external deformity. EXTR: No clubbing/cyanosis/edema NEURO: Normal gait, antalgia PSYCH: Normally interactive. Conversant. Not depressed or anxious appearing.  Calm demeanor.    Left foot: Mild decreased range of motion, notably at the great toe compared to baseline, but mild pain and loss of motion of other toes at the and DP joints as well. Nontender along the metatarsals. Nontender at the navicular. Nontender at the bases fifth metatarsal. Area of prior pain lateral foot essentially NT  Strength is preserved. Normal 2 plus pulses DP and PT. Stable anterior drawer in subtalar tilt testing.  Radiology: Dg Foot Complete Left  06/15/2015  CLINICAL DATA:  Left foot pain 1 month. EXAM: LEFT FOOT - COMPLETE 3+ VIEW COMPARISON:  04/30/2013 FINDINGS: There is no evidence of fracture or dislocation. There is no evidence of arthropathy or other focal bone abnormality. Soft tissues are unremarkable. IMPRESSION: Negative. Electronically Signed   By: Marin Olp M.D.   On: 06/15/2015 12:03    The radiological images were independently reviewed by myself in the office and results were reviewed with the patient. My independent interpretation of images:  Adjacent to the cuboid, the patient does have a fairly large piece of radio opaque matter consistent with bone. Given clinical history, suspect that this is an old fracture fragment more likely. Secondary ossification center cannot fully be excluded. Electronically Signed  By: [name], MD On: 07/13/2015 10:38 AM    I do not have a full set of L foot xrays for comparison, incomplete 2014 films reviewed.   Assessment and Plan:   Left foot pain - Plan: Ambulatory referral to Physical Therapy  Peroneal tendinitis of lower leg, left - Plan: Ambulatory referral to Physical Therapy  Vitamin B 12 deficiency - Plan: cyanocobalamin ((VITAMIN  B-12)) injection 1,000 mcg  Much better Transition out to ASO   PT  Follow-up: 1 mo if not better  Patient Instructions  Transition to out of the CAM boot.   Then wear the lace up - for the next few weeks.     Orders Placed This Encounter  Procedures  . Ambulatory referral to Physical Therapy    Signed,  Frederico Hamman T. Faryal Marxen, MD   Patient's Medications  New Prescriptions   No medications on file  Previous Medications   CYANOCOBALAMIN (,VITAMIN B-12,) 1000 MCG/ML INJECTION    Inject 1,000 mcg into the muscle every 30 (thirty) days.   ESCITALOPRAM (LEXAPRO) 10 MG TABLET    Take 5 mg by mouth daily.   FLUTICASONE (FLONASE) 50 MCG/ACT NASAL SPRAY    Place 2 sprays into  both nostrils daily.   HYDROCHLOROTHIAZIDE (MICROZIDE) 12.5 MG CAPSULE    1 tab by mouth daily prn swelling   IBUPROFEN (ADVIL,MOTRIN) 800 MG TABLET    TAKE ONE TABLET BY MOUTH EVERY 8 HOURS AS NEEDED FOR RELIEF FROM PAIN   MULTIPLE VITAMIN (MULTIVITAMIN) TABLET    Take 1 tablet by mouth daily.   MULTIPLE VITAMINS-MINERALS (HAIR/SKIN/NAILS PO)    Take 1 capsule by mouth daily.   OMEPRAZOLE (PRILOSEC) 20 MG CAPSULE    Take 1 capsule (20 mg total) by mouth daily.   PRAVASTATIN (PRAVACHOL) 20 MG TABLET    Take 1 tablet (20 mg total) by mouth daily.   PROBIOTIC PRODUCT (PRO-BIOTIC BLEND PO)    Take 1 capsule by mouth daily.   PROMETHAZINE (PHENERGAN) 25 MG TABLET    Take 1 tablet (25 mg total) by mouth every 6 (six) hours as needed for nausea or vomiting.   TRAMADOL (ULTRAM) 50 MG TABLET    TAKE ONE TABLET BY MOUTH EVERY SIX HOURS AS NEEDED    ZOLMITRIPTAN (ZOMIG) 5 MG TABLET    Take 1 tablet (5 mg total) by mouth as needed for migraine.  Modified Medications   No medications on file  Discontinued Medications   No medications on file

## 2015-07-13 NOTE — Progress Notes (Signed)
Pre visit review using our clinic review tool, if applicable. No additional management support is needed unless otherwise documented below in the visit note. 

## 2015-07-25 ENCOUNTER — Telehealth: Payer: Self-pay

## 2015-07-25 NOTE — Telephone Encounter (Signed)
Cathy with [facility] left v/m; received order and iontophoresis was checked and that requires dexamethasone to [facility] on [address].Please advise.

## 2015-07-25 NOTE — Telephone Encounter (Signed)
For Iontophoresis  Dexamethasone Sodium Phosphate, 4 mg/mL, 75mL, no refills SIG: Apply as directed in physical therapy script for duration of physical therapy at therapist discretion

## 2015-07-26 ENCOUNTER — Telehealth: Payer: Self-pay | Admitting: *Deleted

## 2015-07-26 MED ORDER — DEXAMETHASONE SODIUM PHOSPHATE 4 MG/ML IJ SOLN: INTRAMUSCULAR | Status: DC

## 2015-07-26 NOTE — Telephone Encounter (Signed)
[name] from pharmacy left a voicemail stating that they received a script for Dexamethasone and they are wondering if this should have gone to the  physical therapist. If not they need to know how many days they need to supply?

## 2015-07-26 NOTE — Telephone Encounter (Signed)
Cathy with [facility] notified prescription has been sent in as requested.  Tye [place] states she will notify patient.

## 2015-07-26 NOTE — Telephone Encounter (Signed)
Carter Kitten, CMA at 07/26/2015 8:59 AM     Status: Signed       Expand All Collapse All   Cathy with [facility] notified prescription has been sent in as requested. Tye Maryland states she will notify patient.            [name], MD at 07/25/2015 5:19 PM     Status: Signed       Expand All Collapse All   For Iontophoresis  Dexamethasone Sodium Phosphate, 4 mg/mL, 51mL, no refills SIG: Apply as directed in physical therapy script for duration of physical therapy at therapist discretion             Helene Shoe, LPN at 50/0/9381 82:99 PM     Status: Signed       Expand All Collapse All   Cathy with [facility] left v/m; received order and iontophoresis was checked and that requires dexamethasone to [facility] [address].Please advise.       See below

## 2015-07-26 NOTE — Telephone Encounter (Signed)
Pharmacist at [facility] notified that patient needs 30 ml of Dexamethosone Sodium Phosphate 4mg /ml as prescribed.  Ms. Probert will pick up vial and take it with her to PT.  They will need to order this for patient to pick up tomorrow.  Advised that will be fine.  Ms. Buffkin notified prescription will not be available for pick up until tomorrow.

## 2015-07-31 ENCOUNTER — Ambulatory Visit: Payer: BLUE CROSS/BLUE SHIELD | Admitting: Family Medicine

## 2015-08-07 ENCOUNTER — Encounter: Payer: Self-pay | Admitting: Family Medicine

## 2015-08-07 ENCOUNTER — Ambulatory Visit (INDEPENDENT_AMBULATORY_CARE_PROVIDER_SITE_OTHER): Payer: BLUE CROSS/BLUE SHIELD | Admitting: Family Medicine

## 2015-08-07 VITALS — BP 130/78 | HR 88 | Temp 98.1°F | Ht 64.75 in | Wt 163.5 lb

## 2015-08-07 DIAGNOSIS — M722 Plantar fascial fibromatosis: Secondary | ICD-10-CM | POA: Diagnosis not present

## 2015-08-07 DIAGNOSIS — M7672 Peroneal tendinitis, left leg: Secondary | ICD-10-CM | POA: Diagnosis not present

## 2015-08-07 DIAGNOSIS — M79672 Pain in left foot: Secondary | ICD-10-CM | POA: Diagnosis not present

## 2015-08-07 MED ORDER — METHYLPREDNISOLONE ACETATE 40 MG/ML IJ SUSP
40.0000 mg | Freq: Once | INTRAMUSCULAR | Status: AC
  Administered 2015-08-07: 40 mg via INTRAMUSCULAR

## 2015-08-07 NOTE — Patient Instructions (Signed)
Please read handouts on Plantar Fascitis.  STRETCHING and Strengthening program critically important.  Strengthening on foot and calf muscles as seen in handout. Calf raises, 2 legged, then 1 legged. Foot massage with tennis ball. Ice massage.  Towel Scrunches: get a towel or hand towel, use toes to pick up and scrunch up the towel.  Marble pick-ups, practice picking up marbles with toes and placing into a cup  NEEDS TO BE DONE EVERY DAY  Recommended over the counter insoles. (Spenco or Hapad) - or what you have.  A rigid shoe with good arch support helps: Dansko (great), Bronwen Betters No easily bendable shoes.   Tuli's heel cups

## 2015-08-07 NOTE — Progress Notes (Signed)
Pre visit review using our clinic review tool, if applicable. No additional management support is needed unless otherwise documented below in the visit note. 

## 2015-08-07 NOTE — Progress Notes (Signed)
Dr. Frederico Hamman T. Jamiah Homeyer, MD, [facility] [place], [address] Phone: [phone] Fax: [phone]  08/07/2015  Patient: [name], MRN: [id number], DOB: 04-Jan-1956, 59 y.o.  Primary Physician:  Arnette Norris, MD  Chief Complaint: Foot Pain  Subjective:   [name] is a 59 y.o. very pleasant female patient who presents with the following:  F/u: new pf r inj pf R  l f/u old inj: OFC is doing quite a bit well, she's been doing rehabilitation and doing formal physical therapy. She initially was in her ASO brace, she wore this for about 2 or 3 weeks. Initially also had her first do a Banker. Overall in this regard she is about 95% better.  NEW: The patient presents with a 2 week long history of R heel pain. This is notable for worsening pain first thing in the morning when arising and standing after sitting. Fairly severe ongoing right now.   Orthotics or bracing: OTC Medications: none PT or home rehab: aggressive stretching Night splints: no Ice massage: no Ball massage: no  Metatarsal pain: no   Body mass index is 27.41 kg/(m^2).   07/13/2015 Last OV with [name], MD  1 mo f/u:  Bone adjacent to cuboid on the L, ? Old healed fx vs accessory growth remnant, pain, imm x 1 mo in short CAM. Feeling better, less pain, but she is not back to baseline.   06/15/2015 Last OV with [name], MD  L foot, a couple of years ago and went and bothered her a little while. At the beach about a month ago and pain since then. Pain with walking. She reports to distinct injuries, first 2 years ago, and then a little bit over a month ago. She stepped down and injured her foot and it did swell up and have some mild amount of bruising. Less so one month ago prior, when she was walking at the beach she injured her foot. Since then has been hurting having some swelling laterally.  Past Medical History, Surgical  History, Social History, Family History, Problem List, Medications, and Allergies have been reviewed and updated if relevant.  Patient Active Problem List   Diagnosis Date Noted  . Well woman exam 05/03/2015  . Menopausal symptoms 02/16/2015  . Vitamin B 12 deficiency 04/11/2014  . Other malaise and fatigue 03/08/2014  . Stress incontinence 08/31/2013  . Cervico-occipital neuralgia 06/01/2013  . IBS (irritable bowel syndrome) 03/30/2013  . HLD (hyperlipidemia) 03/09/2012  . Migraine 12/31/2011  . Anxiety 12/31/2011  . Depression 12/31/2011  . Insomnia 12/31/2011  . Breast cancer ([place]) 12/31/2011    Past Medical History  Diagnosis Date  . Breast cancer ([place]) 02/27/2007  . Migraine   . Hypercholesterolemia   . Anemia   . Anxiety   . Blood transfusion without reported diagnosis     Past Surgical History  Procedure Laterality Date  . Cesarean section      two times  . Abdominal hysterectomy    . Breast surgery  01/14/2013    breast reduction left side  . Foot surgery Right   . Colonoscopy      Social History   Social History  . Marital Status: Married    Spouse Name: N/A  . Number of Children: N/A  . Years of Education: N/A   Occupational History  . Not on file.   Social History Main Topics  . Smoking status:  Never Smoker   . Smokeless tobacco: Never Used  . Alcohol Use: No  . Drug Use: No  . Sexual Activity:    Partners: Male    Birth Control/ Protection: Surgical   Other Topics Concern  . Not on file   Social History Narrative    Family History  Problem Relation Age of Onset  . Thyroid disease Mother   . Hypertension Mother   . Hyperlipidemia Mother   . Alzheimer's disease Mother   . Heart failure Mother   . Esophageal cancer Father   . Hyperlipidemia Father   . Heart murmur Father   . Diabetes Sister   . Alzheimer's disease Maternal Aunt   . Alzheimer's disease Maternal Uncle   . Diabetes Paternal Uncle   . Alzheimer's disease Maternal  Grandmother   . Diabetes Paternal Grandmother   . Colon polyps Father   . Colon cancer Neg Hx   . Breast cancer Sister   . Irritable bowel syndrome Sister   . Ulcerative colitis Sister     ????    Allergies  Allergen Reactions  . Statins Other (See Comments)    Muscle aches    Medication list reviewed and updated in full in [facility].  GEN: No fevers, chills. Nontoxic. Primarily MSK c/o today. MSK: Detailed in the HPI GI: tolerating PO intake without difficulty Neuro: No numbness, parasthesias, or tingling associated. Otherwise the pertinent positives of the ROS are noted above.   Objective:   BP 130/78 mmHg  Pulse 88  Temp(Src) 98.1 F (36.7 C) (Oral)  Ht 5' 4.75" (1.645 m)  Wt 163 lb 8 oz (74.163 kg)  BMI 27.41 kg/m2   GEN: WDWN, NAD, Non-toxic, Alert & Oriented x 3 HEENT: Atraumatic, Normocephalic.  Ears and Nose: No external deformity. EXTR: No clubbing/cyanosis/edema NEURO: Normal gait, antalgia PSYCH: Normally interactive. Conversant. Not depressed or anxious appearing.  Calm demeanor.    Left foot: Mild decreased range of motion, notably at the great toe compared to baseline, but mild pain and loss of motion of other toes at the and DP joints as well. Nontender along the metatarsals. Nontender at the navicular. Nontender at the bases fifth metatarsal. Area of prior pain lateral foot essentially NT  Strength is preserved. Normal 2 plus pulses DP and PT. Stable anterior drawer in subtalar tilt testing.  RIGHT FOOT: Foot exam Echymosis: no Edema: no ROM: full LE B Gait: heel toe, non-antalgic MT pain: no Callus pattern: none Lateral Mall: NT Medial Mall: NT Talus: NT Navicular: NT Calcaneous: NT Metatarsals: NT 5th MT: NT Phalanges: NT Achilles: NT Plantar Fascia: tender, medial along PF. Pain with forced dorsi Fat Pad: NT Peroneals: NT Post Tib: NT Great Toe: Nml motion Ant Drawer: neg Other foot breakdown: none Sensation: intact    Radiology:  Assessment and Plan:   Plantar fasciitis - Plan: methylPREDNISolone acetate (DEPO-MEDROL) injection 40 mg  Left foot pain  Peroneal tendinitis of lower leg, left  PF prob secondary to L foot pain, which has improved.   Given acute pain now, we will inject PF Refer to the patient instructions sections for details of plan shared with patient.   Plantar Fascitis Injection, R  Verbal consent obtained. Risks including risk of rupture, hypopigmentation, and infection reviewed in addition to benefits and alternatives were reviewed. Chloraprep used for prep. Ethyl Chloride used for anesthesia. Under sterile conditions, using the medial approach 4 cc of Lidocaine 1% and Depo-Medrol 40 mg injected superior to plantar fascia and  fanned. No compications. Decreased pain post-injection.  Follow-up: prn  Patient Instructions  Please read handouts on Plantar Fascitis.  STRETCHING and Strengthening program critically important.  Strengthening on foot and calf muscles as seen in handout. Calf raises, 2 legged, then 1 legged. Foot massage with tennis ball. Ice massage.  Towel Scrunches: get a towel or hand towel, use toes to pick up and scrunch up the towel.  Marble pick-ups, practice picking up marbles with toes and placing into a cup  NEEDS TO BE DONE EVERY DAY  Recommended over the counter insoles. (Spenco or Hapad) - or what you have.  A rigid shoe with good arch support helps: Dansko (great), Bronwen Betters No easily bendable shoes.   Tuli's heel cups       Signed,  Zandon Talton T. Hawley Pavia, MD   Patient's Medications  New Prescriptions   No medications on file  Previous Medications   CYANOCOBALAMIN (,VITAMIN B-12,) 1000 MCG/ML INJECTION    Inject 1,000 mcg into the muscle every 30 (thirty) days.   DEXAMETHASONE (DECADRON) 120 MG/30ML SOLN INJECTION    APPLY AS DIRECTED IN PHYSICAL THERAPY FOR DURATION OF PHYSICAL THERAPY AT THERAPIST DISCRETION   FLUTICASONE  (FLONASE) 50 MCG/ACT NASAL SPRAY    Place 2 sprays into both nostrils daily.   HYDROCHLOROTHIAZIDE (MICROZIDE) 12.5 MG CAPSULE    1 tab by mouth daily prn swelling   IBUPROFEN (ADVIL,MOTRIN) 800 MG TABLET    TAKE ONE TABLET BY MOUTH EVERY 8 HOURS AS NEEDED FOR RELIEF FROM PAIN   MULTIPLE VITAMIN (MULTIVITAMIN) TABLET    Take 1 tablet by mouth daily.   MULTIPLE VITAMINS-MINERALS (HAIR/SKIN/NAILS PO)    Take 1 capsule by mouth daily.   OMEPRAZOLE (PRILOSEC) 20 MG CAPSULE    Take 1 capsule (20 mg total) by mouth daily.   PRAVASTATIN (PRAVACHOL) 20 MG TABLET    Take 1 tablet (20 mg total) by mouth daily.   PROBIOTIC PRODUCT (PRO-BIOTIC BLEND PO)    Take 1 capsule by mouth daily.   PROMETHAZINE (PHENERGAN) 25 MG TABLET    Take 1 tablet (25 mg total) by mouth every 6 (six) hours as needed for nausea or vomiting.   TRAMADOL (ULTRAM) 50 MG TABLET    TAKE ONE TABLET BY MOUTH EVERY SIX HOURS AS NEEDED    ZOLMITRIPTAN (ZOMIG) 5 MG TABLET    Take 1 tablet (5 mg total) by mouth as needed for migraine.  Modified Medications   No medications on file  Discontinued Medications   DEXAMETHASONE (DECADRON) 4 MG/ML INJECTION    Apply as directed in physical therapy script for duration of physical therapy at therapist discretion    ESCITALOPRAM (LEXAPRO) 10 MG TABLET    Take 5 mg by mouth daily.

## 2015-08-15 ENCOUNTER — Ambulatory Visit (INDEPENDENT_AMBULATORY_CARE_PROVIDER_SITE_OTHER): Payer: BLUE CROSS/BLUE SHIELD | Admitting: Family Medicine

## 2015-08-15 ENCOUNTER — Encounter: Payer: Self-pay | Admitting: Family Medicine

## 2015-08-15 VITALS — BP 126/84 | HR 83 | Temp 98.2°F | Wt 160.2 lb

## 2015-08-15 DIAGNOSIS — R350 Frequency of micturition: Secondary | ICD-10-CM | POA: Diagnosis not present

## 2015-08-15 LAB — POCT URINALYSIS DIPSTICK
Bilirubin, UA: NEGATIVE
GLUCOSE UA: NEGATIVE
KETONES UA: NEGATIVE
Leukocytes, UA: NEGATIVE
Nitrite, UA: NEGATIVE
PH UA: 6
Protein, UA: NEGATIVE
SPEC GRAV UA: 1.015
UROBILINOGEN UA: 0.2

## 2015-08-15 MED ORDER — CIPROFLOXACIN HCL 500 MG PO TABS: 500.0000 mg | ORAL_TABLET | Freq: Two times a day (BID) | ORAL | Status: DC

## 2015-08-15 NOTE — Addendum Note (Signed)
Addended by: Modena Nunnery on: 08/15/2015 12:17 PM   Modules accepted: Orders

## 2015-08-15 NOTE — Patient Instructions (Signed)

## 2015-08-15 NOTE — Progress Notes (Signed)
SUBJECTIVE: [name] is a 59 y.o. female who complains of urinary frequency, urgency and dysuria x 7 days, without flank pain, fever, chills, or abnormal vaginal discharge or bleeding.   Current Outpatient Prescriptions on File Prior to Visit  Medication Sig Dispense Refill  . cyanocobalamin (,VITAMIN B-12,) 1000 MCG/ML injection Inject 1,000 mcg into the muscle every 30 (thirty) days.    [name] dexamethasone (DECADRON) 120 MG/30ML SOLN injection APPLY AS DIRECTED IN PHYSICAL THERAPY FOR DURATION OF PHYSICAL THERAPY AT THERAPIST DISCRETION  0  . fluticasone (FLONASE) 50 MCG/ACT nasal spray Place 2 sprays into both nostrils daily. 16 g 6  . hydrochlorothiazide (MICROZIDE) 12.5 MG capsule 1 tab by mouth daily prn swelling 30 capsule 1  . ibuprofen (ADVIL,MOTRIN) 800 MG tablet TAKE ONE TABLET BY MOUTH EVERY 8 HOURS AS NEEDED FOR RELIEF FROM PAIN 60 tablet 3  . Multiple Vitamin (MULTIVITAMIN) tablet Take 1 tablet by mouth daily.    . Multiple Vitamins-Minerals (HAIR/SKIN/NAILS PO) Take 1 capsule by mouth daily.    [name] omeprazole (PRILOSEC) 20 MG capsule Take 1 capsule (20 mg total) by mouth daily. 30 capsule 3  . pravastatin (PRAVACHOL) 20 MG tablet Take 1 tablet (20 mg total) by mouth daily. 30 tablet 3  . Probiotic Product (PRO-BIOTIC BLEND PO) Take 1 capsule by mouth daily.    . promethazine (PHENERGAN) 25 MG tablet Take 1 tablet (25 mg total) by mouth every 6 (six) hours as needed for nausea or vomiting. 30 tablet 1  . traMADol (ULTRAM) 50 MG tablet TAKE ONE TABLET BY MOUTH EVERY SIX HOURS AS NEEDED  40 tablet 2  . zolmitriptan (ZOMIG) 5 MG tablet Take 1 tablet (5 mg total) by mouth as needed for migraine. 10 tablet 5   No current facility-administered medications on file prior to visit.    Allergies  Allergen Reactions  . Statins Other (See Comments)    Muscle aches    Past Medical History  Diagnosis Date  . Breast cancer ([place]) 02/27/2007  . Migraine   . Hypercholesterolemia   .  Anemia   . Anxiety   . Blood transfusion without reported diagnosis     Past Surgical History  Procedure Laterality Date  . Cesarean section      two times  . Abdominal hysterectomy    . Breast surgery  01/14/2013    breast reduction left side  . Foot surgery Right   . Colonoscopy      Family History  Problem Relation Age of Onset  . Thyroid disease Mother   . Hypertension Mother   . Hyperlipidemia Mother   . Alzheimer's disease Mother   . Heart failure Mother   . Esophageal cancer Father   . Hyperlipidemia Father   . Heart murmur Father   . Diabetes Sister   . Alzheimer's disease Maternal Aunt   . Alzheimer's disease Maternal Uncle   . Diabetes Paternal Uncle   . Alzheimer's disease Maternal Grandmother   . Diabetes Paternal Grandmother   . Colon polyps Father   . Colon cancer Neg Hx   . Breast cancer Sister   . Irritable bowel syndrome Sister   . Ulcerative colitis Sister     ????    Social History   Social History  . Marital Status: Married    Spouse Name: N/A  . Number of Children: N/A  . Years of Education: N/A   Occupational History  . Not on file.   Social History Main Topics  .  Smoking status: Never Smoker   . Smokeless tobacco: Never Used  . Alcohol Use: No  . Drug Use: No  . Sexual Activity:    Partners: Male    Birth Control/ Protection: Surgical   Other Topics Concern  . Not on file   Social History Narrative   The PMH, PSH, Social History, Family History, Medications, and allergies have been reviewed in [facility], and have been updated if relevant.  OBJECTIVE:  BP 126/84 mmHg  Pulse 83  Temp(Src) 98.2 F (36.8 C) (Oral)  Wt 160 lb 4 oz (72.689 kg)  SpO2 97%  Appears well, in no apparent distress.  Vital signs are normal. The abdomen is soft without tenderness, guarding, mass, rebound or organomegaly. No CVA tenderness or inguinal adenopathy noted. Urine dipstick shows positive for RBC's.   ASSESSMENT: UTI uncomplicated without  evidence of pyelonephritis  PLAN: Treatment per orders -cipro 500 mg twice daily x 3days,  also push fluids, may use Pyridium OTC prn. Call or return to clinic prn if these symptoms worsen or fail to improve as anticipated.

## 2015-08-15 NOTE — Progress Notes (Signed)
Pre visit review using our clinic review tool, if applicable. No additional management support is needed unless otherwise documented below in the visit note. 

## 2015-08-17 LAB — URINE CULTURE

## 2015-09-04 ENCOUNTER — Other Ambulatory Visit: Payer: Self-pay | Admitting: Family Medicine

## 2015-09-26 ENCOUNTER — Other Ambulatory Visit: Payer: Self-pay

## 2015-09-26 MED ORDER — OMEPRAZOLE 20 MG PO CPDR: 20.0000 mg | DELAYED_RELEASE_CAPSULE | Freq: Every day | ORAL | Status: DC

## 2015-09-26 NOTE — Telephone Encounter (Signed)
[facility] [place] left v/m requesting 90 day refill omeprazole. Last annual 05/15/15. Refill done per protocol.

## 2015-10-04 ENCOUNTER — Telehealth: Payer: Self-pay | Admitting: Family Medicine

## 2015-10-04 ENCOUNTER — Ambulatory Visit (INDEPENDENT_AMBULATORY_CARE_PROVIDER_SITE_OTHER)
Admission: RE | Admit: 2015-10-04 | Discharge: 2015-10-04 | Disposition: A | Discharge status: Home / Self Care | Payer: BLUE CROSS/BLUE SHIELD | Source: Ambulatory Visit | Attending: Family Medicine | Admitting: Family Medicine

## 2015-10-04 ENCOUNTER — Encounter: Payer: Self-pay | Admitting: Family Medicine

## 2015-10-04 ENCOUNTER — Ambulatory Visit (INDEPENDENT_AMBULATORY_CARE_PROVIDER_SITE_OTHER): Payer: BLUE CROSS/BLUE SHIELD | Admitting: Family Medicine

## 2015-10-04 VITALS — BP 120/80 | HR 105 | Temp 97.7°F | Ht 64.75 in | Wt 170.0 lb

## 2015-10-04 DIAGNOSIS — M79672 Pain in left foot: Secondary | ICD-10-CM

## 2015-10-04 DIAGNOSIS — M7672 Peroneal tendinitis, left leg: Secondary | ICD-10-CM

## 2015-10-04 DIAGNOSIS — M79671 Pain in right foot: Secondary | ICD-10-CM | POA: Diagnosis not present

## 2015-10-04 DIAGNOSIS — M722 Plantar fascial fibromatosis: Secondary | ICD-10-CM | POA: Diagnosis not present

## 2015-10-04 DIAGNOSIS — E538 Deficiency of other specified B group vitamins: Secondary | ICD-10-CM

## 2015-10-04 MED ORDER — CYANOCOBALAMIN 1000 MCG/ML IJ SOLN
1000.0000 ug | Freq: Once | INTRAMUSCULAR | Status: AC
  Administered 2015-10-04: 1000 ug via INTRAMUSCULAR

## 2015-10-04 MED ORDER — TRAMADOL HCL 50 MG PO TABS: 50.0000 mg | ORAL_TABLET | Freq: Four times a day (QID) | ORAL | Status: DC | PRN

## 2015-10-04 NOTE — Progress Notes (Signed)
Pre visit review using our clinic review tool, if applicable. No additional management support is needed unless otherwise documented below in the visit note. 

## 2015-10-04 NOTE — Telephone Encounter (Signed)
Heel x-rays results given to Ms. Landenberger by telephone.

## 2015-10-04 NOTE — Telephone Encounter (Signed)
Patient returned Donna's call. °

## 2015-10-04 NOTE — Patient Instructions (Signed)
Dansko shoes.

## 2015-10-04 NOTE — Progress Notes (Signed)
Dr. Frederico Hamman T. Shanti Agresti, MD, [facility] [place], [address] Phone: [phone] Fax: [phone]  10/04/2015  Patient: [name], MRN: [id number], DOB: 12-09-1955, 60 y.o.  Primary Physician:  Arnette Norris, MD  Chief Complaint: Foot Pain  Subjective:   [name] is a 60 y.o. very pleasant female patient who presents with the following:  R PF: Left foot pain is completely gone, and her right heel pain is still quite dramatic. Status post plantar fascia injection on her last office visit. She is very compliant with all of her rehabilitation, and she has some orthotics as well as an arch binder. She's been icing her foot and heel.  08/07/2015 Last OV with [name], MD  F/u: new pf r inj pf R  l f/u old inj: OFC is doing quite a bit well, she's been doing rehabilitation and doing formal physical therapy. She initially was in her ASO brace, she wore this for about 2 or 3 weeks. Initially also had her first do a Banker. Overall in this regard she is about 95% better.  NEW: The patient presents with a 2 week long history of R heel pain. This is notable for worsening pain first thing in the morning when arising and standing after sitting. Fairly severe ongoing right now.   Orthotics or bracing: OTC Medications: none PT or home rehab: aggressive stretching Night splints: no Ice massage: no Ball massage: no  Metatarsal pain: no   Body mass index is 28.5 kg/(m^2).   07/13/2015 Last OV with [name], MD  1 mo f/u:  Bone adjacent to cuboid on the L, ? Old healed fx vs accessory growth remnant, pain, imm x 1 mo in short CAM. Feeling better, less pain, but she is not back to baseline.   06/15/2015 Last OV with [name], MD  L foot, a couple of years ago and went and bothered her a little while. At the beach about a month ago and pain since then. Pain with walking. She reports to distinct  injuries, first 2 years ago, and then a little bit over a month ago. She stepped down and injured her foot and it did swell up and have some mild amount of bruising. Less so one month ago prior, when she was walking at the beach she injured her foot. Since then has been hurting having some swelling laterally.  Past Medical History, Surgical History, Social History, Family History, Problem List, Medications, and Allergies have been reviewed and updated if relevant.  Patient Active Problem List   Diagnosis Date Noted  . Well woman exam 05/03/2015  . Menopausal symptoms 02/16/2015  . Vitamin B 12 deficiency 04/11/2014  . Other malaise and fatigue 03/08/2014  . Stress incontinence 08/31/2013  . Cervico-occipital neuralgia 06/01/2013  . IBS (irritable bowel syndrome) 03/30/2013  . HLD (hyperlipidemia) 03/09/2012  . Migraine 12/31/2011  . Anxiety 12/31/2011  . Depression 12/31/2011  . Insomnia 12/31/2011  . Breast cancer ([place]) 12/31/2011    Past Medical History  Diagnosis Date  . Breast cancer ([place]) 02/27/2007  . Migraine   . Hypercholesterolemia   . Anemia   . Anxiety   . Blood transfusion without reported diagnosis     Past Surgical History  Procedure Laterality Date  . Cesarean section      two times  . Abdominal hysterectomy    . Breast surgery  01/14/2013    breast reduction left  side  . Foot surgery Right   . Colonoscopy      Social History   Social History  . Marital Status: Married    Spouse Name: N/A  . Number of Children: N/A  . Years of Education: N/A   Occupational History  . Not on file.   Social History Main Topics  . Smoking status: Never Smoker   . Smokeless tobacco: Never Used  . Alcohol Use: No  . Drug Use: No  . Sexual Activity:    Partners: Male    Birth Control/ Protection: Surgical   Other Topics Concern  . Not on file   Social History Narrative    Family History  Problem Relation Age of Onset  . Thyroid disease Mother   .  Hypertension Mother   . Hyperlipidemia Mother   . Alzheimer's disease Mother   . Heart failure Mother   . Esophageal cancer Father   . Hyperlipidemia Father   . Heart murmur Father   . Diabetes Sister   . Alzheimer's disease Maternal Aunt   . Alzheimer's disease Maternal Uncle   . Diabetes Paternal Uncle   . Alzheimer's disease Maternal Grandmother   . Diabetes Paternal Grandmother   . Colon polyps Father   . Colon cancer Neg Hx   . Breast cancer Sister   . Irritable bowel syndrome Sister   . Ulcerative colitis Sister     ????    Allergies  Allergen Reactions  . Statins Other (See Comments)    Muscle aches    Medication list reviewed and updated in full in [place].  GEN: No fevers, chills. Nontoxic. Primarily MSK c/o today. MSK: Detailed in the HPI GI: tolerating PO intake without difficulty Neuro: No numbness, parasthesias, or tingling associated. Otherwise the pertinent positives of the ROS are noted above.   Objective:   BP 120/80 mmHg  Pulse 105  Temp(Src) 97.7 F (36.5 C) (Oral)  Ht 5' 4.75" (1.645 m)  Wt 170 lb (77.111 kg)  BMI 28.50 kg/m2   GEN: WDWN, NAD, Non-toxic, Alert & Oriented x 3 HEENT: Atraumatic, Normocephalic.  Ears and Nose: No external deformity. EXTR: No clubbing/cyanosis/edema NEURO: Normal gait, antalgia PSYCH: Normally interactive. Conversant. Not depressed or anxious appearing.  Calm demeanor.    Left foot: NT throughout  RIGHT FOOT: Foot exam Echymosis: no Edema: no ROM: full LE B Gait: heel toe, non-antalgic MT pain: no Callus pattern: none Lateral Mall: NT Medial Mall: NT Talus: NT Navicular: NT Calcaneous: NT Metatarsals: NT 5th MT: NT Phalanges: NT Achilles: NT Plantar Fascia: tender, medial along PF. Pain with forced dorsi Fat Pad: NT Peroneals: NT Post Tib: NT Great Toe: Nml motion Ant Drawer: neg Other foot breakdown: none Sensation: intact   Radiology:  Assessment and Plan:   Right foot  pain - Plan: DG Os Calcis Right  Vitamin B 12 deficiency - Plan: cyanocobalamin ((VITAMIN B-12)) injection 1,000 mcg  Plantar fasciitis  Left foot pain  Peroneal tendinitis of lower leg, left  Challenging case of plantar fasciitis on the right. Obtain calcaneus film to ensure no bony anomaly on the right. Stress fracture would seem unlikely in this case.  Reviewed basic care. Likelihood symptoms will last anywhere on 6-9 months.  Patient Instructions  Dansko shoes   Follow-up: No Follow-up on file.  New Prescriptions   No medications on file   Modified Medications   Modified Medication Previous Medication   TRAMADOL (ULTRAM) 50 MG TABLET traMADol (ULTRAM) 50 MG tablet  Take 1 tablet (50 mg total) by mouth every 6 (six) hours as needed.    TAKE ONE TABLET BY MOUTH EVERY SIX HOURS AS NEEDED    Orders Placed This Encounter  Procedures  . DG Os Calcis Right    Signed,  Milah Recht T. Landyn Buckalew, MD   Patient's Medications  New Prescriptions   No medications on file  Previous Medications   CYANOCOBALAMIN (,VITAMIN B-12,) 1000 MCG/ML INJECTION    Inject 1,000 mcg into the muscle every 30 (thirty) days.   DEXAMETHASONE (DECADRON) 120 MG/30ML SOLN INJECTION    APPLY AS DIRECTED IN PHYSICAL THERAPY FOR DURATION OF PHYSICAL THERAPY AT THERAPIST DISCRETION   FLUTICASONE (FLONASE) 50 MCG/ACT NASAL SPRAY    Place 2 sprays into both nostrils daily.   HYDROCHLOROTHIAZIDE (MICROZIDE) 12.5 MG CAPSULE    1 tab by mouth daily prn swelling   IBUPROFEN (ADVIL,MOTRIN) 800 MG TABLET    TAKE ONE TABLET BY MOUTH EVERY 8 HOURS AS NEEDED FOR RELIEF FROM PAIN   MULTIPLE VITAMIN (MULTIVITAMIN) TABLET    Take 1 tablet by mouth daily.   MULTIPLE VITAMINS-MINERALS (HAIR/SKIN/NAILS PO)    Take 1 capsule by mouth daily.   OMEPRAZOLE (PRILOSEC) 20 MG CAPSULE    Take 1 capsule (20 mg total) by mouth daily.   PRAVASTATIN (PRAVACHOL) 20 MG TABLET    TAKE 1 TABLET (20 MG TOTAL) BY MOUTH DAILY.   PROMETHAZINE  (PHENERGAN) 25 MG TABLET    Take 1 tablet (25 mg total) by mouth every 6 (six) hours as needed for nausea or vomiting.   ZOLMITRIPTAN (ZOMIG) 5 MG TABLET    Take 1 tablet (5 mg total) by mouth as needed for migraine.  Modified Medications   Modified Medication Previous Medication   TRAMADOL (ULTRAM) 50 MG TABLET traMADol (ULTRAM) 50 MG tablet      Take 1 tablet (50 mg total) by mouth every 6 (six) hours as needed.    TAKE ONE TABLET BY MOUTH EVERY SIX HOURS AS NEEDED   Discontinued Medications   CIPROFLOXACIN (CIPRO) 500 MG TABLET    Take 1 tablet (500 mg total) by mouth 2 (two) times daily.   PROBIOTIC PRODUCT (PRO-BIOTIC BLEND PO)    Take 1 capsule by mouth daily.

## 2015-12-11 ENCOUNTER — Encounter: Payer: Self-pay | Admitting: Family Medicine

## 2015-12-11 ENCOUNTER — Other Ambulatory Visit: Payer: Self-pay | Admitting: Family Medicine

## 2015-12-11 MED ORDER — IBUPROFEN 800 MG PO TABS: 800.0000 mg | ORAL_TABLET | Freq: Three times a day (TID) | ORAL | Status: DC | PRN

## 2016-02-20 ENCOUNTER — Ambulatory Visit (INDEPENDENT_AMBULATORY_CARE_PROVIDER_SITE_OTHER): Payer: BLUE CROSS/BLUE SHIELD | Admitting: Family Medicine

## 2016-02-20 ENCOUNTER — Encounter: Payer: Self-pay | Admitting: Family Medicine

## 2016-02-20 VITALS — BP 122/78 | HR 94 | Temp 98.3°F | Wt 168.2 lb

## 2016-02-20 DIAGNOSIS — F39 Unspecified mood [affective] disorder: Secondary | ICD-10-CM

## 2016-02-20 DIAGNOSIS — N951 Menopausal and female climacteric states: Secondary | ICD-10-CM | POA: Diagnosis not present

## 2016-02-20 DIAGNOSIS — R4586 Emotional lability: Secondary | ICD-10-CM | POA: Insufficient documentation

## 2016-02-20 MED ORDER — ESCITALOPRAM OXALATE 10 MG PO TABS: 10.0000 mg | ORAL_TABLET | Freq: Every day | ORAL | Status: DC

## 2016-02-20 NOTE — Patient Instructions (Signed)
Great to see you.  We are restarting lexapro 10 mg daily. Please call me in a few weeks with an update.

## 2016-02-20 NOTE — Progress Notes (Signed)
Subjective:   Patient ID: [name], female    DOB: 11/29/1955, 60 y.o.   MRN: [id number]  [name] is a pleasant 60 y.o. year old female who presents to clinic today with mood swing  on 02/20/2016  HPI:  Hot flashes and mood swings worse over the past few months.  Was on celexa for years but weaned off of it in 02/2015 and started lexapro as she felt it was no longer effective- note reviewed from 02/16/2015.  Lexapro worked well for depression, anxiety and hot flashes but she did not want to have to take something every day and weaned off of it.  Insomnia is now worse too.  Denies SI or HI.   Current Outpatient Prescriptions on File Prior to Visit  Medication Sig Dispense Refill  . cyanocobalamin (,VITAMIN B-12,) 1000 MCG/ML injection Inject 1,000 mcg into the muscle every 30 (thirty) days.    . fluticasone (FLONASE) 50 MCG/ACT nasal spray Place 2 sprays into both nostrils daily. 16 g 6  . ibuprofen (ADVIL,MOTRIN) 800 MG tablet Take 1 tablet (800 mg total) by mouth every 8 (eight) hours as needed. 60 tablet 3  . Multiple Vitamin (MULTIVITAMIN) tablet Take 1 tablet by mouth daily.    . Multiple Vitamins-Minerals (HAIR/SKIN/NAILS PO) Take 1 capsule by mouth daily.    [name] omeprazole (PRILOSEC) 20 MG capsule Take 1 capsule (20 mg total) by mouth daily. 90 capsule 1  . pravastatin (PRAVACHOL) 20 MG tablet TAKE 1 TABLET (20 MG TOTAL) BY MOUTH DAILY. 30 tablet 5  . promethazine (PHENERGAN) 25 MG tablet Take 1 tablet (25 mg total) by mouth every 6 (six) hours as needed for nausea or vomiting. 30 tablet 1  . traMADol (ULTRAM) 50 MG tablet Take 1 tablet (50 mg total) by mouth every 6 (six) hours as needed. 40 tablet 2  . zolmitriptan (ZOMIG) 5 MG tablet Take 1 tablet (5 mg total) by mouth as needed for migraine. 10 tablet 5   No current facility-administered medications on file prior to visit.    Allergies  Allergen Reactions  . Statins Other (See Comments)    Muscle aches     Past Medical History  Diagnosis Date  . Breast cancer ([place]) 02/27/2007  . Migraine   . Hypercholesterolemia   . Anemia   . Anxiety   . Blood transfusion without reported diagnosis     Past Surgical History  Procedure Laterality Date  . Cesarean section      two times  . Abdominal hysterectomy    . Breast surgery  01/14/2013    breast reduction left side  . Foot surgery Right   . Colonoscopy      Family History  Problem Relation Age of Onset  . Thyroid disease Mother   . Hypertension Mother   . Hyperlipidemia Mother   . Alzheimer's disease Mother   . Heart failure Mother   . Esophageal cancer Father   . Hyperlipidemia Father   . Heart murmur Father   . Diabetes Sister   . Alzheimer's disease Maternal Aunt   . Alzheimer's disease Maternal Uncle   . Diabetes Paternal Uncle   . Alzheimer's disease Maternal Grandmother   . Diabetes Paternal Grandmother   . Colon polyps Father   . Colon cancer Neg Hx   . Breast cancer Sister   . Irritable bowel syndrome Sister   . Ulcerative colitis Sister     ????    Social History   Social History  .  Marital Status: Married    Spouse Name: N/A  . Number of Children: N/A  . Years of Education: N/A   Occupational History  . Not on file.   Social History Main Topics  . Smoking status: Never Smoker   . Smokeless tobacco: Never Used  . Alcohol Use: No  . Drug Use: No  . Sexual Activity:    Partners: Male    Birth Control/ Protection: Surgical   Other Topics Concern  . Not on file   Social History Narrative   The PMH, PSH, Social History, Family History, Medications, and allergies have been reviewed in [facility], and have been updated if relevant.   Review of Systems  Endocrine: Negative for heat intolerance.  Psychiatric/Behavioral: Positive for sleep disturbance, dysphoric mood and decreased concentration. Negative for suicidal ideas, hallucinations, behavioral problems, confusion, self-injury and agitation. The  patient is nervous/anxious. The patient is not hyperactive.        Objective:    BP 122/78 mmHg  Pulse 94  Temp(Src) 98.3 F (36.8 C) (Oral)  Wt 168 lb 4 oz (76.318 kg)  SpO2 94%   Physical Exam  Constitutional: She is oriented to person, place, and time. She appears well-developed and well-nourished. No distress.  HENT:  Head: Normocephalic.  Eyes: Conjunctivae are normal.  Cardiovascular: Normal rate.   Pulmonary/Chest: Effort normal.  Musculoskeletal: Normal range of motion.  Neurological: She is alert and oriented to person, place, and time. No cranial nerve deficit.  Skin: Skin is warm and dry. She is not diaphoretic.  Psychiatric: She has a normal mood and affect. Her behavior is normal. Judgment and thought content normal.  Nursing note and vitals reviewed.         Assessment & Plan:   Mood swings (HCC)  Menopausal symptoms No Follow-up on file.

## 2016-02-20 NOTE — Assessment & Plan Note (Signed)
Deteriorated along with deteriorated mood swings and anxiety. >25 minutes spent in face to face time with patient, >50% spent in counselling or coordination of care Restart lexapro 10 mg daily. She will call me in a few weeks with an update. The patient indicates understanding of these issues and agrees with the plan.

## 2016-02-20 NOTE — Progress Notes (Signed)
Pre visit review using our clinic review tool, if applicable. No additional management support is needed unless otherwise documented below in the visit note. 

## 2016-03-01 ENCOUNTER — Encounter: Payer: Self-pay | Admitting: Family Medicine

## 2016-04-21 ENCOUNTER — Other Ambulatory Visit: Payer: Self-pay | Admitting: Family Medicine

## 2016-04-30 ENCOUNTER — Other Ambulatory Visit: Payer: Self-pay | Admitting: Family Medicine

## 2016-04-30 DIAGNOSIS — Z1231 Encounter for screening mammogram for malignant neoplasm of breast: Secondary | ICD-10-CM

## 2016-05-03 ENCOUNTER — Ambulatory Visit (INDEPENDENT_AMBULATORY_CARE_PROVIDER_SITE_OTHER): Payer: BLUE CROSS/BLUE SHIELD | Admitting: Family Medicine

## 2016-05-03 ENCOUNTER — Encounter: Payer: Self-pay | Admitting: Family Medicine

## 2016-05-03 VITALS — BP 170/98 | HR 76 | Temp 98.4°F | Wt 170.5 lb

## 2016-05-03 DIAGNOSIS — J309 Allergic rhinitis, unspecified: Secondary | ICD-10-CM

## 2016-05-03 DIAGNOSIS — N3090 Cystitis, unspecified without hematuria: Secondary | ICD-10-CM | POA: Diagnosis not present

## 2016-05-03 DIAGNOSIS — R350 Frequency of micturition: Secondary | ICD-10-CM

## 2016-05-03 LAB — POC URINALSYSI DIPSTICK (AUTOMATED)
Bilirubin, UA: NEGATIVE
GLUCOSE UA: NEGATIVE
KETONES UA: NEGATIVE
Leukocytes, UA: NEGATIVE
Nitrite, UA: NEGATIVE
PROTEIN UA: NEGATIVE
SPEC GRAV UA: 1.025
Urobilinogen, UA: NEGATIVE
pH, UA: 6

## 2016-05-03 MED ORDER — FLUTICASONE PROPIONATE 50 MCG/ACT NA SUSP: 2.0000 | Freq: Every day | NASAL | 6 refills | Status: DC

## 2016-05-03 MED ORDER — CIPROFLOXACIN HCL 250 MG PO TABS: 250.0000 mg | ORAL_TABLET | Freq: Two times a day (BID) | ORAL | 0 refills | Status: DC

## 2016-05-03 NOTE — Patient Instructions (Addendum)
Please ask for a [facility] with your annual exam blood work   Urinary Tract Infection Urinary tract infections (UTIs) can develop anywhere along your urinary tract. Your urinary tract is your body's drainage system for removing wastes and extra water. Your urinary tract includes two kidneys, two ureters, a bladder, and a urethra. Your kidneys are a pair of bean-shaped organs. Each kidney is about the size of your fist. They are located below your ribs, one on each side of your spine. CAUSES Infections are caused by microbes, which are microscopic organisms, including fungi, viruses, and bacteria. These organisms are so small that they can only be seen through a microscope. Bacteria are the microbes that most commonly cause UTIs. SYMPTOMS  Symptoms of UTIs may vary by age and gender of the patient and by the location of the infection. Symptoms in young women typically include a frequent and intense urge to urinate and a painful, burning feeling in the bladder or urethra during urination. Older women and men are more likely to be tired, shaky, and weak and have muscle aches and abdominal pain. A fever may mean the infection is in your kidneys. Other symptoms of a kidney infection include pain in your back or sides below the ribs, nausea, and vomiting. DIAGNOSIS To diagnose a UTI, your caregiver will ask you about your symptoms. Your caregiver will also ask you to provide a urine sample. The urine sample will be tested for bacteria and white blood cells. White blood cells are made by your body to help fight infection. TREATMENT  Typically, UTIs can be treated with medication. Because most UTIs are caused by a bacterial infection, they usually can be treated with the use of antibiotics. The choice of antibiotic and length of treatment depend on your symptoms and the type of bacteria causing your infection. HOME CARE INSTRUCTIONS  If you were prescribed antibiotics, take them exactly as your caregiver instructs  you. Finish the medication even if you feel better after you have only taken some of the medication.  Drink enough water and fluids to keep your urine clear or pale yellow.  Avoid caffeine, tea, and carbonated beverages. They tend to irritate your bladder.  Empty your bladder often. Avoid holding urine for long periods of time.  Empty your bladder before and after sexual intercourse.  After a bowel movement, women should cleanse from front to back. Use each tissue only once. SEEK MEDICAL CARE IF:   You have back pain.  You develop a fever.  Your symptoms do not begin to resolve within 3 days. SEEK IMMEDIATE MEDICAL CARE IF:   You have severe back pain or lower abdominal pain.  You develop chills.  You have nausea or vomiting.  You have continued burning or discomfort with urination. MAKE SURE YOU:   Understand these instructions.  Will watch your condition.  Will get help right away if you are not doing well or get worse.   This information is not intended to replace advice given to you by your health care provider. Make sure you discuss any questions you have with your health care provider.   Document Released: 06/12/2005 Document Revised: 05/24/2015 Document Reviewed: 10/11/2011 Elsevier Interactive Patient Education [employer].

## 2016-05-03 NOTE — Progress Notes (Signed)
Subjective:    Patient ID: [name], female    DOB: 21-Aug-1956, 60 y.o.   MRN: [id number]  HPI This is a 60 yo female who presents today with some lower abdominal pain and side pain x 2 days. Also some dysuria and urinary frequency. No fever/chills, felt a little nauseous this morning. Thinks this is related to drinking water on an empty stomach. Had sex the night before symptoms started and had a busy day and didn't void as frequently as she normally does. No fever/chills, no back pain. Has had occasional cystitis during perimenopause. Requests same medication used for last episode.  Has been having runny nose, watery eyes. Requests refill of flonase which she has used in the past with good results.   Past Medical History:  Diagnosis Date  . Anemia   . Anxiety   . Blood transfusion without reported diagnosis   . Breast cancer ([place]) 02/27/2007  . Hypercholesterolemia   . Migraine    Past Surgical History:  Procedure Laterality Date  . ABDOMINAL HYSTERECTOMY    . BREAST SURGERY  01/14/2013   breast reduction left side  . CESAREAN SECTION     two times  . COLONOSCOPY    . FOOT SURGERY Right    Family History  Problem Relation Age of Onset  . Thyroid disease Mother   . Hypertension Mother   . Hyperlipidemia Mother   . Alzheimer's disease Mother   . Heart failure Mother   . Esophageal cancer Father   . Hyperlipidemia Father   . Heart murmur Father   . Diabetes Sister   . Alzheimer's disease Maternal Aunt   . Alzheimer's disease Maternal Uncle   . Diabetes Paternal Uncle   . Alzheimer's disease Maternal Grandmother   . Diabetes Paternal Grandmother   . Colon polyps Father   . Colon cancer Neg Hx   . Breast cancer Sister   . Irritable bowel syndrome Sister   . Ulcerative colitis Sister     ????   Social History  Substance Use Topics  . Smoking status: Never Smoker  . Smokeless tobacco: Never Used  . Alcohol use No      Review of Systems Per HPI      Objective:   Physical Exam  Constitutional: She is oriented to person, place, and time. She appears well-developed and well-nourished. No distress.  HENT:  Head: Normocephalic and atraumatic.  Cardiovascular: Normal rate, regular rhythm and normal heart sounds.   Pulmonary/Chest: Effort normal and breath sounds normal.  Abdominal: Soft. Bowel sounds are normal. She exhibits no distension. There is no tenderness. There is no rebound, no guarding and no CVA tenderness.  Musculoskeletal: Normal range of motion.  Neurological: She is alert and oriented to person, place, and time.  Skin: Skin is warm and dry. She is not diaphoretic.  Psychiatric: She has a normal mood and affect. Her behavior is normal. Judgment and thought content normal.  Vitals reviewed.     BP (!) 170/98 (BP Location: Left Arm, Patient Position: Sitting, Cuff Size: Normal)   Pulse 76   Temp 98.4 F (36.9 C) (Oral)   Wt 170 lb 8 oz (77.3 kg)   SpO2 98%   BMI 28.59 kg/m  Recheck BP- 142/86    Results for orders placed or performed in visit on 05/03/16  POCT Urinalysis Dipstick (Automated)  Result Value Ref Range   Color, UA yellow    Clarity, UA cloudy    Glucose, UA negative  Bilirubin, UA negative    Ketones, UA negative    Spec Grav, UA 1.025    Blood, UA 2+    pH, UA 6.0    Protein, UA negative    Urobilinogen, UA negative    Nitrite, UA negative    Leukocytes, UA Negative Negative    Assessment & Plan:  1. Cystitis - UA shows blood, no leukocytes/nitrites, not sure if infection vs. Urethral irritation. Discussed this with patient. Will start antibiotic for symptoms. She has appointment for CPE in a couple of weeks, recheck for hematuria.  - ciprofloxacin (CIPRO) 250 MG tablet; Take 1 tablet (250 mg total) by mouth 2 (two) times daily.  Dispense: 6 tablet; Refill: 0 - Urine culture  2. Allergic rhinitis, unspecified allergic rhinitis type - fluticasone (FLONASE) 50 MCG/ACT nasal spray; Place 2  sprays into both nostrils daily.  Dispense: 16 g; Refill: 6  3. Urinary frequency - POCT Urinalysis Dipstick (Automated)   Clarene Reamer, FNP-BC  [facility], [place]  05/04/2016 6:46 AM

## 2016-05-03 NOTE — Progress Notes (Signed)
Pre-visit discussion using our clinic review tool. No additional management support is needed unless otherwise documented below in the visit note.  

## 2016-05-04 ENCOUNTER — Other Ambulatory Visit: Payer: Self-pay | Admitting: Family Medicine

## 2016-05-04 DIAGNOSIS — Z0000 Encounter for general adult medical examination without abnormal findings: Secondary | ICD-10-CM | POA: Insufficient documentation

## 2016-05-04 DIAGNOSIS — Z01419 Encounter for gynecological examination (general) (routine) without abnormal findings: Secondary | ICD-10-CM

## 2016-05-04 DIAGNOSIS — E785 Hyperlipidemia, unspecified: Secondary | ICD-10-CM

## 2016-05-05 LAB — URINE CULTURE: Organism ID, Bacteria: 10000

## 2016-05-09 ENCOUNTER — Ambulatory Visit
Admission: RE | Admit: 2016-05-09 | Discharge: 2016-05-09 | Disposition: A | Discharge status: Home / Self Care | Payer: BLUE CROSS/BLUE SHIELD | Source: Ambulatory Visit | Attending: Family Medicine | Admitting: Family Medicine

## 2016-05-09 DIAGNOSIS — Z1231 Encounter for screening mammogram for malignant neoplasm of breast: Secondary | ICD-10-CM

## 2016-05-10 ENCOUNTER — Other Ambulatory Visit (INDEPENDENT_AMBULATORY_CARE_PROVIDER_SITE_OTHER): Payer: BLUE CROSS/BLUE SHIELD

## 2016-05-10 DIAGNOSIS — Z01419 Encounter for gynecological examination (general) (routine) without abnormal findings: Secondary | ICD-10-CM

## 2016-05-10 DIAGNOSIS — Z0000 Encounter for general adult medical examination without abnormal findings: Secondary | ICD-10-CM

## 2016-05-10 DIAGNOSIS — Z780 Asymptomatic menopausal state: Secondary | ICD-10-CM | POA: Diagnosis not present

## 2016-05-10 LAB — COMPREHENSIVE METABOLIC PANEL
ALBUMIN: 4.3 g/dL (ref 3.5–5.2)
ALT: 22 U/L (ref 0–35)
AST: 21 U/L (ref 0–37)
Alkaline Phosphatase: 61 U/L (ref 39–117)
BILIRUBIN TOTAL: 0.3 mg/dL (ref 0.2–1.2)
BUN: 19 mg/dL (ref 6–23)
CALCIUM: 9 mg/dL (ref 8.4–10.5)
CO2: 30 mEq/L (ref 19–32)
CREATININE: 0.78 mg/dL (ref 0.40–1.20)
Chloride: 103 mEq/L (ref 96–112)
GFR: 80.15 mL/min (ref >60.00)
Glucose, Bld: 107 mg/dL — ABNORMAL HIGH (ref 70–99)
Potassium: 4.2 mEq/L (ref 3.5–5.1)
Sodium: 141 mEq/L (ref 135–145)
Total Protein: 7.2 g/dL (ref 6.0–8.3)

## 2016-05-10 LAB — CBC WITH DIFFERENTIAL/PLATELET
BASOS ABS: 0 10*3/uL (ref 0.0–0.1)
BASOS PCT: 0.4 % (ref 0.0–3.0)
EOS ABS: 0.2 10*3/uL (ref 0.0–0.7)
Eosinophils Relative: 3.1 % (ref 0.0–5.0)
HEMATOCRIT: 37.4 % (ref 36.0–46.0)
HEMOGLOBIN: 12.7 g/dL (ref 12.0–15.0)
LYMPHS PCT: 29 % (ref 12.0–46.0)
Lymphs Abs: 1.4 10*3/uL (ref 0.7–4.0)
MCHC: 33.9 g/dL (ref 30.0–36.0)
MCV: 84.2 fl (ref 78.0–100.0)
MONOS PCT: 7.6 % (ref 3.0–12.0)
Monocytes Absolute: 0.4 10*3/uL (ref 0.1–1.0)
Neutro Abs: 2.9 10*3/uL (ref 1.4–7.7)
Neutrophils Relative %: 59.9 % (ref 43.0–77.0)
Platelets: 237 10*3/uL (ref 150.0–400.0)
RBC: 4.44 Mil/uL (ref 3.87–5.11)
RDW: 14.5 % (ref 11.5–15.5)
WBC: 4.8 10*3/uL (ref 4.0–10.5)

## 2016-05-10 LAB — LIPID PANEL
CHOLESTEROL: 208 mg/dL — AB (ref 0–200)
HDL: 59.3 mg/dL (ref >39.00)
LDL Cholesterol: 115 mg/dL — ABNORMAL HIGH (ref 0–99)
NonHDL: 148.53
TRIGLYCERIDES: 169 mg/dL — AB (ref 0.0–149.0)
Total CHOL/HDL Ratio: 4
VLDL: 33.8 mg/dL (ref 0.0–40.0)

## 2016-05-10 LAB — TSH: TSH: 1.75 u[IU]/mL (ref 0.35–4.50)

## 2016-05-10 LAB — FOLLICLE STIMULATING HORMONE: FSH: 61.5 m[IU]/mL

## 2016-05-16 ENCOUNTER — Encounter: Payer: Self-pay | Admitting: Family Medicine

## 2016-05-16 ENCOUNTER — Ambulatory Visit (INDEPENDENT_AMBULATORY_CARE_PROVIDER_SITE_OTHER): Payer: BLUE CROSS/BLUE SHIELD | Admitting: Family Medicine

## 2016-05-16 VITALS — BP 122/62 | HR 81 | Temp 99.2°F | Ht 65.0 in | Wt 170.5 lb

## 2016-05-16 DIAGNOSIS — Z01419 Encounter for gynecological examination (general) (routine) without abnormal findings: Secondary | ICD-10-CM

## 2016-05-16 DIAGNOSIS — R319 Hematuria, unspecified: Secondary | ICD-10-CM | POA: Diagnosis not present

## 2016-05-16 DIAGNOSIS — Z0000 Encounter for general adult medical examination without abnormal findings: Secondary | ICD-10-CM | POA: Diagnosis not present

## 2016-05-16 DIAGNOSIS — Z23 Encounter for immunization: Secondary | ICD-10-CM

## 2016-05-16 DIAGNOSIS — F419 Anxiety disorder, unspecified: Secondary | ICD-10-CM

## 2016-05-16 DIAGNOSIS — G43009 Migraine without aura, not intractable, without status migrainosus: Secondary | ICD-10-CM

## 2016-05-16 DIAGNOSIS — E785 Hyperlipidemia, unspecified: Secondary | ICD-10-CM | POA: Diagnosis not present

## 2016-05-16 DIAGNOSIS — F329 Major depressive disorder, single episode, unspecified: Secondary | ICD-10-CM

## 2016-05-16 DIAGNOSIS — F32A Depression, unspecified: Secondary | ICD-10-CM

## 2016-05-16 LAB — POC URINALSYSI DIPSTICK (AUTOMATED)
Bilirubin, UA: NEGATIVE
Glucose, UA: NEGATIVE
Ketones, UA: NEGATIVE
LEUKOCYTES UA: NEGATIVE
Nitrite, UA: NEGATIVE
PROTEIN UA: NEGATIVE
Spec Grav, UA: 1.02
UROBILINOGEN UA: 0.2
pH, UA: 6.5

## 2016-05-16 LAB — URINALYSIS, MICROSCOPIC ONLY

## 2016-05-16 MED ORDER — ZOLMITRIPTAN 5 MG PO TABS: 5.0000 mg | ORAL_TABLET | ORAL | 5 refills | Status: DC | PRN

## 2016-05-16 MED ORDER — PRAVASTATIN SODIUM 20 MG PO TABS: ORAL_TABLET | ORAL | 3 refills | Status: DC

## 2016-05-16 NOTE — Addendum Note (Signed)
Addended by: Lucille Passy on: 05/16/2016 08:59 AM   Modules accepted: Orders

## 2016-05-16 NOTE — Assessment & Plan Note (Signed)
Well controlled on current dose of pravachol. No changes made today.

## 2016-05-16 NOTE — Assessment & Plan Note (Signed)
Well controlled on current dose of lexapro. No changes made today. 

## 2016-05-16 NOTE — Patient Instructions (Signed)
Good to see you. Please stop by the lab on your way out.

## 2016-05-16 NOTE — Progress Notes (Signed)
Subjective:   Patient ID: [name], female    DOB: 1956/04/17, 60 y.o.   MRN: [id number]  [name] is a pleasant 60 y.o. year old female who presents to clinic today with Annual Exam and Hematuria (to be retested ) and follow up of chronic medical conditions on 05/16/2016  HPI:  Remote h/o hysterectomy Colonoscopy 02/2009 Mammogram 05/09/16 Tdap 08/31/13  Hematuria- when she saw [facility] on 05/03/16- had 2 + blood on her UA, otherwise negative. Dysuria and suprapubic pressure have resolved.  Depression-currently taking Lexapro 10 mg daily  for depression and post menopausal symptoms.  Tried 20 mg daily but couldn't tolerate side effects.  Symptoms much improved on 10 mg daily.  No anxiety or mood swings.  Hot flashes a little better.  HLD- Improved with Pravachol.  Could not tolerate Crestor or Zocor.  Both caused muscle aches.  Lab Results  Component Value Date   CHOL 208 (H) 05/10/2016   HDL 59.30 05/10/2016   LDLCALC 115 (H) 05/10/2016   LDLDIRECT 208.2 08/31/2013   TRIG 169.0 (H) 05/10/2016   CHOLHDL 4 05/10/2016    Lab Results  Component Value Date   WBC 4.8 05/10/2016   HGB 12.7 05/10/2016   HCT 37.4 05/10/2016   MCV 84.2 05/10/2016   PLT 237.0 05/10/2016   Lab Results  Component Value Date   TSH 1.75 05/10/2016   Lab Results  Component Value Date   ALT 22 05/10/2016   AST 21 05/10/2016   ALKPHOS 61 05/10/2016   BILITOT 0.3 05/10/2016    Lab Results  Component Value Date   CREATININE 0.78 05/10/2016   Current Outpatient Prescriptions on File Prior to Visit  Medication Sig Dispense Refill  . cyanocobalamin (,VITAMIN B-12,) 1000 MCG/ML injection Inject 1,000 mcg into the muscle every 30 (thirty) days.    [name] escitalopram (LEXAPRO) 10 MG tablet Take 1 tablet (10 mg total) by mouth daily. 30 tablet 3  . fluticasone (FLONASE) 50 MCG/ACT nasal spray Place 2 sprays into both nostrils daily. 16 g 6  . ibuprofen (ADVIL,MOTRIN) 800 MG tablet Take  1 tablet (800 mg total) by mouth every 8 (eight) hours as needed. 60 tablet 3  . Multiple Vitamin (MULTIVITAMIN) tablet Take 1 tablet by mouth daily.    . Multiple Vitamins-Minerals (HAIR/SKIN/NAILS PO) Take 1 capsule by mouth daily.    [name] omeprazole (PRILOSEC) 20 MG capsule Take 1 capsule (20 mg total) by mouth daily. COMPLETE PHYSICAL EXAM REQUIRED FOR ADDITIONAL REFILLS 30 capsule 0  . promethazine (PHENERGAN) 25 MG tablet Take 1 tablet (25 mg total) by mouth every 6 (six) hours as needed for nausea or vomiting. 30 tablet 1  . traMADol (ULTRAM) 50 MG tablet Take 1 tablet (50 mg total) by mouth every 6 (six) hours as needed. 40 tablet 2  . zolmitriptan (ZOMIG) 5 MG tablet Take 1 tablet (5 mg total) by mouth as needed for migraine. 10 tablet 5   No current facility-administered medications on file prior to visit.     Allergies  Allergen Reactions  . Statins Other (See Comments)    Muscle aches    Past Medical History:  Diagnosis Date  . Anemia   . Anxiety   . Blood transfusion without reported diagnosis   . Breast cancer ([place]) 02/27/2007  . Hypercholesterolemia   . Migraine     Past Surgical History:  Procedure Laterality Date  . ABDOMINAL HYSTERECTOMY    . BREAST SURGERY  01/14/2013  breast reduction left side  . CESAREAN SECTION     two times  . COLONOSCOPY    . FOOT SURGERY Right     Family History  Problem Relation Age of Onset  . Thyroid disease Mother   . Hypertension Mother   . Hyperlipidemia Mother   . Alzheimer's disease Mother   . Heart failure Mother   . Esophageal cancer Father   . Hyperlipidemia Father   . Heart murmur Father   . Diabetes Sister   . Alzheimer's disease Maternal Aunt   . Alzheimer's disease Maternal Uncle   . Diabetes Paternal Uncle   . Alzheimer's disease Maternal Grandmother   . Diabetes Paternal Grandmother   . Colon polyps Father   . Colon cancer Neg Hx   . Breast cancer Sister   . Irritable bowel syndrome Sister   .  Ulcerative colitis Sister     ????    Social History   Social History  . Marital status: Married    Spouse name: N/A  . Number of children: N/A  . Years of education: N/A   Occupational History  . Not on file.   Social History Main Topics  . Smoking status: Never Smoker  . Smokeless tobacco: Never Used  . Alcohol use No  . Drug use: No  . Sexual activity: Yes    Partners: Male    Birth control/ protection: Surgical   Other Topics Concern  . Not on file   Social History Narrative  . No narrative on file   The PMH, PSH, Social History, Family History, Medications, and allergies have been reviewed in [facility], and have been updated if relevant.    Review of Systems  Constitutional: Negative.   HENT: Negative.   Respiratory: Negative.   Cardiovascular: Negative.   Gastrointestinal: Negative.   Endocrine: Negative.   Genitourinary: Negative.   Musculoskeletal: Negative.   Skin: Negative.   Allergic/Immunologic: Negative.   Neurological: Negative.   Hematological: Negative.   Psychiatric/Behavioral: Negative.   All other systems reviewed and are negative.      Objective:    BP 122/62   Pulse 81   Temp 99.2 F (37.3 C) (Oral)   Ht 5\' 5"  (1.651 m)   Wt 170 lb 8 oz (77.3 kg)   SpO2 95%   BMI 28.37 kg/m    Physical Exam   General:  Well-developed,well-nourished,in no acute distress; alert,appropriate and cooperative throughout examination Head:  normocephalic and atraumatic.   Eyes:  vision grossly intact, pupils equal, pupils round, and pupils reactive to light.   Ears:  R ear normal and L ear normal.   Nose:  no external deformity.   Mouth:  good dentition.   Neck:  No deformities, masses, or tenderness noted. Breasts:  No mass, nodules, thickening, tenderness, bulging, retraction, inflamation, nipple discharge or skin changes noted.   Lungs:  Normal respiratory effort, chest expands symmetrically. Lungs are clear to auscultation, no crackles or  wheezes. Heart:  Normal rate and regular rhythm. S1 and S2 normal without gallop, murmur, click, rub or other extra sounds. Abdomen:  Bowel sounds positive,abdomen soft and non-tender without masses, organomegaly or hernias noted. Rectal:  no external abnormalities.   Genitalia:  Pelvic Exam:        External: normal female genitalia without lesions or masses        Vagina: normal without lesions or masses              Adnexa: normal bimanual exam without  masses or fullness        Uterus: absent Msk:  No deformity or scoliosis noted of thoracic or lumbar spine.   Extremities:  No clubbing, cyanosis, edema, or deformity noted with normal full range of motion of all joints.   Neurologic:  alert & oriented X3 and gait normal.   Skin:  Intact without suspicious lesions or rashes Cervical Nodes:  No lymphadenopathy noted Axillary Nodes:  No palpable lymphadenopathy Psych:  Cognition and judgment appear intact. Alert and cooperative with normal attention span and concentration. No apparent delusions, illusions, hallucinations       Assessment & Plan:   Well woman exam  Need for influenza vaccination - Plan: Flu Vaccine QUAD 36+ mos PF IM (Fluarix & Fluzone Quad PF)  HLD (hyperlipidemia)  Depression  Anxiety  Hematuria - Plan: POCT Urinalysis Dipstick (Automated) No Follow-up on file.

## 2016-05-16 NOTE — Assessment & Plan Note (Signed)
Reviewed preventive care protocols, scheduled due services, and updated immunizations Discussed nutrition, exercise, diet, and healthy lifestyle.  

## 2016-05-16 NOTE — Progress Notes (Signed)
Pre visit review using our clinic review tool, if applicable. No additional management support is needed unless otherwise documented below in the visit note. 

## 2016-05-16 NOTE — Assessment & Plan Note (Signed)
Persistent. Send urine for micro and cx. Plan to refer to urology if micro pos.

## 2016-05-18 LAB — URINE CULTURE: ORGANISM ID, BACTERIA: NO GROWTH

## 2016-05-22 ENCOUNTER — Encounter: Payer: Self-pay | Admitting: Family Medicine

## 2016-05-23 ENCOUNTER — Other Ambulatory Visit: Payer: Self-pay | Admitting: Family Medicine

## 2016-05-23 DIAGNOSIS — R319 Hematuria, unspecified: Secondary | ICD-10-CM

## 2016-06-16 ENCOUNTER — Other Ambulatory Visit: Payer: Self-pay | Admitting: Family Medicine

## 2016-06-26 ENCOUNTER — Other Ambulatory Visit: Payer: Self-pay | Admitting: Family Medicine

## 2016-07-01 ENCOUNTER — Encounter: Payer: Self-pay | Admitting: Family Medicine

## 2016-07-01 ENCOUNTER — Other Ambulatory Visit: Payer: Self-pay | Admitting: Family Medicine

## 2016-07-04 ENCOUNTER — Encounter: Payer: Self-pay | Admitting: Family Medicine

## 2016-07-04 ENCOUNTER — Other Ambulatory Visit: Payer: Self-pay | Admitting: Radiology

## 2016-07-04 ENCOUNTER — Ambulatory Visit (INDEPENDENT_AMBULATORY_CARE_PROVIDER_SITE_OTHER): Payer: BLUE CROSS/BLUE SHIELD | Admitting: Family Medicine

## 2016-07-04 DIAGNOSIS — R3129 Other microscopic hematuria: Secondary | ICD-10-CM | POA: Diagnosis not present

## 2016-07-04 DIAGNOSIS — R319 Hematuria, unspecified: Secondary | ICD-10-CM

## 2016-07-04 LAB — URINALYSIS, MICROSCOPIC ONLY

## 2016-07-04 LAB — POC URINALSYSI DIPSTICK (AUTOMATED)
BILIRUBIN UA: NEGATIVE
GLUCOSE UA: NEGATIVE
KETONES UA: NEGATIVE
LEUKOCYTES UA: NEGATIVE
NITRITE UA: NEGATIVE
PH UA: 6
Protein, UA: NEGATIVE
Spec Grav, UA: >=1.03
Urobilinogen, UA: 0.2

## 2016-07-04 NOTE — Progress Notes (Signed)
Subjective:   Patient ID: [name], female    DOB: 1956/04/04, 60 y.o.   MRN: [id number]  [name] is a pleasant 60 y.o. year old female who presents to clinic today with Follow-up  on 07/04/2016  HPI:  Persistent microscopic hematuria-  Has appointment with urology but wanted to get her urine rechecked her today.  Months of microscopic hematuria, intermittently symptomatic.  Has not had any dysuria, frequency or back pain in weeks.  She questions if she had a kidney stone that passed and maybe does not need to keep her appointment with urology on Monday.    Current Outpatient Prescriptions on File Prior to Visit  Medication Sig Dispense Refill  . cyanocobalamin (,VITAMIN B-12,) 1000 MCG/ML injection Inject 1,000 mcg into the muscle every 30 (thirty) days.    [name] escitalopram (LEXAPRO) 10 MG tablet TAKE 1 TABLET (10 MG TOTAL) BY MOUTH DAILY. 90 tablet 1  . fluticasone (FLONASE) 50 MCG/ACT nasal spray Place 2 sprays into both nostrils daily. 16 g 6  . ibuprofen (ADVIL,MOTRIN) 800 MG tablet Take 1 tablet (800 mg total) by mouth every 8 (eight) hours as needed. 60 tablet 3  . Multiple Vitamin (MULTIVITAMIN) tablet Take 1 tablet by mouth daily.    . Multiple Vitamins-Minerals (HAIR/SKIN/NAILS PO) Take 1 capsule by mouth daily.    [name] omeprazole (PRILOSEC) 20 MG capsule Take 1 capsule (20 mg total) by mouth daily. 90 capsule 2  . pravastatin (PRAVACHOL) 20 MG tablet TAKE 1 TABLET (20 MG TOTAL) BY MOUTH DAILY. 90 tablet 3  . promethazine (PHENERGAN) 25 MG tablet Take 1 tablet (25 mg total) by mouth every 6 (six) hours as needed for nausea or vomiting. 30 tablet 1  . traMADol (ULTRAM) 50 MG tablet Take 1 tablet (50 mg total) by mouth every 6 (six) hours as needed. 40 tablet 2  . zolmitriptan (ZOMIG) 5 MG tablet Take 1 tablet (5 mg total) by mouth as needed for migraine. 10 tablet 5   No current facility-administered medications on file prior to visit.     Allergies  Allergen  Reactions  . Statins Other (See Comments)    Muscle aches    Past Medical History:  Diagnosis Date  . Anemia   . Anxiety   . Blood transfusion without reported diagnosis   . Breast cancer ([place]) 02/27/2007  . Hypercholesterolemia   . Migraine     Past Surgical History:  Procedure Laterality Date  . ABDOMINAL HYSTERECTOMY    . BREAST SURGERY  01/14/2013   breast reduction left side  . CESAREAN SECTION     two times  . COLONOSCOPY    . FOOT SURGERY Right     Family History  Problem Relation Age of Onset  . Thyroid disease Mother   . Hypertension Mother   . Hyperlipidemia Mother   . Alzheimer's disease Mother   . Heart failure Mother   . Esophageal cancer Father   . Hyperlipidemia Father   . Heart murmur Father   . Diabetes Sister   . Alzheimer's disease Maternal Aunt   . Alzheimer's disease Maternal Uncle   . Diabetes Paternal Uncle   . Alzheimer's disease Maternal Grandmother   . Diabetes Paternal Grandmother   . Colon polyps Father   . Colon cancer Neg Hx   . Breast cancer Sister   . Irritable bowel syndrome Sister   . Ulcerative colitis Sister     ????    Social History   Social  History  . Marital status: Married    Spouse name: N/A  . Number of children: N/A  . Years of education: N/A   Occupational History  . Not on file.   Social History Main Topics  . Smoking status: Never Smoker  . Smokeless tobacco: Never Used  . Alcohol use No  . Drug use: No  . Sexual activity: Yes    Partners: Male    Birth control/ protection: Surgical   Other Topics Concern  . Not on file   Social History Narrative  . No narrative on file   The PMH, PSH, Social History, Family History, Medications, and allergies have been reviewed in [facility], and have been updated if relevant.   Review of Systems  Constitutional: Negative.   Genitourinary: Negative.   Musculoskeletal: Negative.   All other systems reviewed and are negative.      Objective:    BP 132/78    Pulse 75   Temp 98.2 F (36.8 C) (Oral)   Wt 174 lb 4 oz (79 kg)   SpO2 97%   BMI 29.00 kg/m    Physical Exam  Constitutional: She is oriented to person, place, and time. She appears well-developed and well-nourished. No distress.  HENT:  Head: Normocephalic and atraumatic.  Eyes: Conjunctivae are normal.  Cardiovascular: Normal rate.   Pulmonary/Chest: Effort normal.  Musculoskeletal: Normal range of motion.  Neurological: She is alert and oriented to person, place, and time. No cranial nerve deficit.  Skin: Skin is warm and dry. She is not diaphoretic.  Psychiatric: She has a normal mood and affect. Her behavior is normal. Judgment and thought content normal.  Nursing note and vitals reviewed.         Assessment & Plan:   Microscopic hematuria - Plan: POCT Urinalysis Dipstick (Automated) No Follow-up on file.

## 2016-07-04 NOTE — Progress Notes (Signed)
Pre visit review using our clinic review tool, if applicable. No additional management support is needed unless otherwise documented below in the visit note. 

## 2016-07-04 NOTE — Assessment & Plan Note (Signed)
UA again positive for 2+ blood today, again send for urine micro but also advised her to keep urology appointment on Monday. The patient indicates understanding of these issues and agrees with the plan.

## 2016-07-04 NOTE — Addendum Note (Signed)
Addended by: Ellamae Sia on: 07/04/2016 12:24 PM   Modules accepted: Orders

## 2016-07-30 ENCOUNTER — Ambulatory Visit (INDEPENDENT_AMBULATORY_CARE_PROVIDER_SITE_OTHER): Payer: BLUE CROSS/BLUE SHIELD | Admitting: Internal Medicine

## 2016-07-30 ENCOUNTER — Encounter: Payer: Self-pay | Admitting: Internal Medicine

## 2016-07-30 VITALS — BP 122/84 | HR 77 | Temp 98.3°F | Wt 174.0 lb

## 2016-07-30 DIAGNOSIS — J0100 Acute maxillary sinusitis, unspecified: Secondary | ICD-10-CM | POA: Diagnosis not present

## 2016-07-30 MED ORDER — DOXYCYCLINE HYCLATE 100 MG PO TABS: 100.0000 mg | ORAL_TABLET | Freq: Two times a day (BID) | ORAL | 0 refills | Status: DC

## 2016-07-30 NOTE — Progress Notes (Signed)
HPI  Pt presents to the clinic today with c/o headahce, facial pain and pressure, nasal congestion and cough. This started 1 week ago. She is blowing green, bloody mucous out of her nose. The cough is non productive. She denies fever but has had chills and body aches. She has tried Copywriter, advertising, Sudafed and Robitussin with minimal relief. She has no history of allergies or breathing problems. She has not had sick contacts that she is aware of.  Review of Systems     Past Medical History:  Diagnosis Date  . Anemia   . Anxiety   . Blood transfusion without reported diagnosis   . Breast cancer ([place]) 02/27/2007  . Hypercholesterolemia   . Migraine     Family History  Problem Relation Age of Onset  . Thyroid disease Mother   . Hypertension Mother   . Hyperlipidemia Mother   . Alzheimer's disease Mother   . Heart failure Mother   . Esophageal cancer Father   . Hyperlipidemia Father   . Heart murmur Father   . Diabetes Sister   . Alzheimer's disease Maternal Aunt   . Alzheimer's disease Maternal Uncle   . Diabetes Paternal Uncle   . Alzheimer's disease Maternal Grandmother   . Diabetes Paternal Grandmother   . Colon polyps Father   . Colon cancer Neg Hx   . Breast cancer Sister   . Irritable bowel syndrome Sister   . Ulcerative colitis Sister     ????    Social History   Social History  . Marital status: Married    Spouse name: N/A  . Number of children: N/A  . Years of education: N/A   Occupational History  . Not on file.   Social History Main Topics  . Smoking status: Never Smoker  . Smokeless tobacco: Never Used  . Alcohol use No  . Drug use: No  . Sexual activity: Yes    Partners: Male    Birth control/ protection: Surgical   Other Topics Concern  . Not on file   Social History Narrative  . No narrative on file    Allergies  Allergen Reactions  . Statins Other (See Comments)    Muscle aches     Constitutional: Positive headache Denies fatigue,  fever or abrupt weight changes.  HEENT:  Positive eye pain, facial pain, nasal congestion. Denies eye redness, ear pain, ringing in the ears, wax buildup, runny nose or sore throat. Respiratory: Positive cough. Denies difficulty breathing or shortness of breath.  Cardiovascular: Denies chest pain, chest tightness, palpitations or swelling in the hands or feet.   No other specific complaints in a complete review of systems (except as listed in HPI above).  Objective:   BP 122/84   Pulse 77   Temp 98.3 F (36.8 C) (Oral)   Wt 174 lb (78.9 kg)   SpO2 98%   BMI 28.96 kg/m   General: Appears her stated age, ill appearing in NAD. HEENT: Head: normal shape and size, maxillary sinus tenderness noted; Eyes: sclera white, no icterus, conjunctiva pink; Ears: Tm's gray and intact, normal light reflex; Nose: mucosa boggy and moist, septum midline; Throat/Mouth: + PND. Teeth present, mucosa pink and moist, no exudate noted, no lesions or ulcerations noted.  Neck:  No adenopathy noted.  Cardiovascular: Normal rate and rhythm. S1,S2 noted.  No murmur, rubs or gallops noted.  Pulmonary/Chest: Normal effort and positive vesicular breath sounds. No respiratory distress. No wheezes, rales or ronchi noted.  Assessment & Plan:   Acute bacterial sinusitis  Can use a Neti Pot which can be purchased from your local drug store. Flonase 2 sprays each nostril for 3 days and then as needed. eRx for Doxycycline BID for 10 days  RTC as needed or if symptoms persist. Webb Silversmith, NP

## 2016-07-31 NOTE — Patient Instructions (Signed)

## 2016-08-12 ENCOUNTER — Other Ambulatory Visit: Payer: Self-pay | Admitting: Family Medicine

## 2016-08-12 ENCOUNTER — Encounter: Payer: Self-pay | Admitting: Family Medicine

## 2016-08-12 MED ORDER — BENZONATATE 200 MG PO CAPS: 200.0000 mg | ORAL_CAPSULE | Freq: Two times a day (BID) | ORAL | 0 refills | Status: DC | PRN

## 2016-09-23 ENCOUNTER — Ambulatory Visit: Payer: BLUE CROSS/BLUE SHIELD | Admitting: Family Medicine

## 2016-09-26 ENCOUNTER — Ambulatory Visit: Payer: BLUE CROSS/BLUE SHIELD | Admitting: Family Medicine

## 2016-09-30 ENCOUNTER — Encounter: Payer: Self-pay | Admitting: Family Medicine

## 2016-09-30 ENCOUNTER — Ambulatory Visit (INDEPENDENT_AMBULATORY_CARE_PROVIDER_SITE_OTHER): Payer: BLUE CROSS/BLUE SHIELD | Admitting: Family Medicine

## 2016-09-30 VITALS — BP 142/80 | HR 76 | Temp 98.1°F | Wt 174.8 lb

## 2016-09-30 DIAGNOSIS — F419 Anxiety disorder, unspecified: Secondary | ICD-10-CM | POA: Diagnosis not present

## 2016-09-30 DIAGNOSIS — R030 Elevated blood-pressure reading, without diagnosis of hypertension: Secondary | ICD-10-CM | POA: Diagnosis not present

## 2016-09-30 MED ORDER — ESCITALOPRAM OXALATE 20 MG PO TABS: 20.0000 mg | ORAL_TABLET | Freq: Every day | ORAL | 6 refills | Status: DC

## 2016-09-30 NOTE — Patient Instructions (Signed)
Great to see you.  We are increasing your lexapro to 20 mg daily.  Keep a check on your blood pressure and call me.  OMRON is good brand.

## 2016-09-30 NOTE — Progress Notes (Signed)
Pre visit review using our clinic review tool, if applicable. No additional management support is needed unless otherwise documented below in the visit note. 

## 2016-09-30 NOTE — Assessment & Plan Note (Signed)
Close to normotensive here x 2. Likely due to increased stressors and possible a faulty BP cuff at home. Advised a new cuff, OMRON and will increase dose of lexapro. She will continue to monitor BP at home and keep me updated.

## 2016-09-30 NOTE — Assessment & Plan Note (Signed)
Deteriorated. Increase lexapro to 20 mg daily. Call or return to clinic prn if these symptoms worsen or fail to improve as anticipated. The patient indicates understanding of these issues and agrees with the plan.

## 2016-09-30 NOTE — Progress Notes (Signed)
Subjective:   Patient ID: [name], female    DOB: 05/16/1956, 61 y.o.   MRN: [id number]  [name] is a pleasant 61 y.o. year old female who presents to clinic today with Hypertension (185/90)  on 09/30/2016  HPI:  Elevated blood pressure- intermittent for past 2 months.  Cheeks get flushed and checks her BP, running in 180s/90s.  She would check it a few hours later and it was in the 120s/70s.  Has been under more stress lately. Mom has dementia, she is working on many projects and cleaning houses.  No CP or SOB.  + FH HTN.  Wt Readings from Last 3 Encounters:  09/30/16 174 lb 12 oz (79.3 kg)  07/30/16 174 lb (78.9 kg)  07/04/16 174 lb 4 oz (79 kg)     Lab Results  Component Value Date   CREATININE 0.78 05/10/2016   Lab Results  Component Value Date   TSH 1.75 05/10/2016     BP Readings from Last 3 Encounters:  09/30/16 (!) 142/80  07/30/16 122/84  07/04/16 132/78    Current Outpatient Prescriptions on File Prior to Visit  Medication Sig Dispense Refill  . escitalopram (LEXAPRO) 10 MG tablet TAKE 1 TABLET (10 MG TOTAL) BY MOUTH DAILY. 90 tablet 1  . fluticasone (FLONASE) 50 MCG/ACT nasal spray Place 2 sprays into both nostrils daily. 16 g 6  . ibuprofen (ADVIL,MOTRIN) 800 MG tablet Take 1 tablet (800 mg total) by mouth every 8 (eight) hours as needed. 60 tablet 3  . Multiple Vitamin (MULTIVITAMIN) tablet Take 1 tablet by mouth daily.    . Multiple Vitamins-Minerals (HAIR/SKIN/NAILS PO) Take 1 capsule by mouth daily.    [name] omeprazole (PRILOSEC) 20 MG capsule Take 1 capsule (20 mg total) by mouth daily. 90 capsule 2  . pravastatin (PRAVACHOL) 20 MG tablet TAKE 1 TABLET (20 MG TOTAL) BY MOUTH DAILY. 90 tablet 3  . promethazine (PHENERGAN) 25 MG tablet Take 1 tablet (25 mg total) by mouth every 6 (six) hours as needed for nausea or vomiting. 30 tablet 1  . traMADol (ULTRAM) 50 MG tablet Take 1 tablet (50 mg total) by mouth every 6 (six) hours as needed. 40  tablet 2  . zolmitriptan (ZOMIG) 5 MG tablet Take 1 tablet (5 mg total) by mouth as needed for migraine. 10 tablet 5   No current facility-administered medications on file prior to visit.     Allergies  Allergen Reactions  . Statins Other (See Comments)    Muscle aches    Past Medical History:  Diagnosis Date  . Anemia   . Anxiety   . Blood transfusion without reported diagnosis   . Breast cancer ([place]) 02/27/2007  . Hypercholesterolemia   . Migraine     Past Surgical History:  Procedure Laterality Date  . ABDOMINAL HYSTERECTOMY    . BREAST SURGERY  01/14/2013   breast reduction left side  . CESAREAN SECTION     two times  . COLONOSCOPY    . FOOT SURGERY Right     Family History  Problem Relation Age of Onset  . Thyroid disease Mother   . Hypertension Mother   . Hyperlipidemia Mother   . Alzheimer's disease Mother   . Heart failure Mother   . Esophageal cancer Father   . Hyperlipidemia Father   . Heart murmur Father   . Colon polyps Father   . Diabetes Sister   . Alzheimer's disease Maternal Aunt   . Alzheimer's disease  Maternal Uncle   . Diabetes Paternal Uncle   . Alzheimer's disease Maternal Grandmother   . Diabetes Paternal Grandmother   . Breast cancer Sister   . Irritable bowel syndrome Sister   . Ulcerative colitis Sister     ????  . Colon cancer Neg Hx     Social History   Social History  . Marital status: Married    Spouse name: N/A  . Number of children: N/A  . Years of education: N/A   Occupational History  . Not on file.   Social History Main Topics  . Smoking status: Never Smoker  . Smokeless tobacco: Never Used  . Alcohol use No  . Drug use: No  . Sexual activity: Yes    Partners: Male    Birth control/ protection: Surgical   Other Topics Concern  . Not on file   Social History Narrative  . No narrative on file   The PMH, PSH, Social History, Family History, Medications, and allergies have been reviewed in [facility], and have  been updated if relevant.   Review of Systems  Constitutional: Negative.   Eyes: Negative.   Cardiovascular: Negative.   Gastrointestinal: Negative.   Musculoskeletal: Negative.   Neurological: Positive for headaches. Negative for dizziness, tremors, seizures, syncope, facial asymmetry, speech difficulty, weakness, light-headedness and numbness.  Psychiatric/Behavioral: Negative for confusion, decreased concentration and dysphoric mood. The patient is nervous/anxious.   All other systems reviewed and are negative.      Objective:    BP (!) 142/80   Pulse 76   Temp 98.1 F (36.7 C) (Oral)   Wt 174 lb 12 oz (79.3 kg)   SpO2 97%   BMI 29.08 kg/m    Physical Exam  Constitutional: She is oriented to person, place, and time. She appears well-developed and well-nourished. No distress.  HENT:  Head: Normocephalic.  Eyes: Conjunctivae are normal.  Cardiovascular: Normal rate and regular rhythm.   Pulmonary/Chest: Effort normal and breath sounds normal.  Musculoskeletal: Normal range of motion.  Neurological: She is alert and oriented to person, place, and time. No cranial nerve deficit.  Skin: Skin is warm and dry. She is not diaphoretic.  Psychiatric: She has a normal mood and affect. Her behavior is normal. Judgment and thought content normal.  Nursing note and vitals reviewed.         Assessment & Plan:   Elevated blood pressure reading No Follow-up on file.

## 2016-10-30 ENCOUNTER — Encounter: Payer: Self-pay | Admitting: Family Medicine

## 2016-11-11 ENCOUNTER — Encounter: Payer: Self-pay | Admitting: Family Medicine

## 2016-12-31 ENCOUNTER — Other Ambulatory Visit: Payer: Self-pay | Admitting: Family Medicine

## 2016-12-31 DIAGNOSIS — G43009 Migraine without aura, not intractable, without status migrainosus: Secondary | ICD-10-CM

## 2017-01-20 ENCOUNTER — Ambulatory Visit (INDEPENDENT_AMBULATORY_CARE_PROVIDER_SITE_OTHER): Payer: BLUE CROSS/BLUE SHIELD | Admitting: Family Medicine

## 2017-01-20 ENCOUNTER — Encounter: Payer: Self-pay | Admitting: Family Medicine

## 2017-01-20 ENCOUNTER — Other Ambulatory Visit: Payer: Self-pay | Admitting: Family Medicine

## 2017-01-20 ENCOUNTER — Telehealth: Payer: Self-pay | Admitting: Family Medicine

## 2017-01-20 VITALS — BP 138/88 | HR 113 | Temp 98.7°F | Wt 177.0 lb

## 2017-01-20 DIAGNOSIS — R2231 Localized swelling, mass and lump, right upper limb: Secondary | ICD-10-CM

## 2017-01-20 DIAGNOSIS — C50111 Malignant neoplasm of central portion of right female breast: Secondary | ICD-10-CM

## 2017-01-20 NOTE — Progress Notes (Signed)
Pre visit review using our clinic review tool, if applicable. No additional management support is needed unless otherwise documented below in the visit note. 

## 2017-01-20 NOTE — Patient Instructions (Signed)
Great to see you. Please stop by to see Marion on your way out.   

## 2017-01-20 NOTE — Assessment & Plan Note (Signed)
New- given history, this does warrant further evaluation. May be reactive and now irritated from manipulation.  Diag mammo and ultrasound ordered.. The patient indicates understanding of these issues and agrees with the plan.

## 2017-01-20 NOTE — Telephone Encounter (Signed)
[facility] [place] will be sending a request for you to sign for pt ultra sound

## 2017-01-20 NOTE — Progress Notes (Signed)
Subjective:   Patient ID: [name], female    DOB: 22-Nov-1955, 61 y.o.   MRN: [id number]  [name] is a pleasant 61 y.o. year old female who presents to clinic today with Enlarged Lymph Nodes (2 knots in right axillary area for a few weeks. H/O Breast Cancer in that breast. Last mammogram last fall.)  on 01/20/2017  HPI:  Axillary lumps- two under right axilla.  Noticed these lumps about 2 weeks ago.  She feels they are becoming larger and more painful.  History of breast CA insitu(right)- 10 years ago- s/p lumpectomy, radiation and tamoxifen, so this she is very concerned about these lumps.  Neg mammogram 05/09/16- note reviewed.   Current Outpatient Prescriptions on File Prior to Visit  Medication Sig Dispense Refill  . escitalopram (LEXAPRO) 20 MG tablet Take 1 tablet (20 mg total) by mouth daily. 30 tablet 6  . fluticasone (FLONASE) 50 MCG/ACT nasal spray Place 2 sprays into both nostrils daily. 16 g 6  . ibuprofen (ADVIL,MOTRIN) 800 MG tablet Take 1 tablet (800 mg total) by mouth every 8 (eight) hours as needed. 60 tablet 3  . Multiple Vitamin (MULTIVITAMIN) tablet Take 1 tablet by mouth daily.    [name] omeprazole (PRILOSEC) 20 MG capsule Take 1 capsule (20 mg total) by mouth daily. 90 capsule 2  . pravastatin (PRAVACHOL) 20 MG tablet TAKE 1 TABLET (20 MG TOTAL) BY MOUTH DAILY. 90 tablet 3  . promethazine (PHENERGAN) 25 MG tablet Take 1 tablet (25 mg total) by mouth every 6 (six) hours as needed for nausea or vomiting. 30 tablet 1  . traMADol (ULTRAM) 50 MG tablet Take 1 tablet (50 mg total) by mouth every 6 (six) hours as needed. 40 tablet 2  . zolmitriptan (ZOMIG) 5 MG tablet TAKE 1 TABLET (5 MG TOTAL) BY MOUTH AS NEEDED FOR MIGRAINE. 10 tablet 3   No current facility-administered medications on file prior to visit.     Allergies  Allergen Reactions  . Statins Other (See Comments)    Muscle aches    Past Medical History:  Diagnosis Date  . Anemia   . Anxiety   .  Blood transfusion without reported diagnosis   . Breast cancer ([place]) 02/27/2007  . Hypercholesterolemia   . Migraine     Past Surgical History:  Procedure Laterality Date  . ABDOMINAL HYSTERECTOMY    . BREAST SURGERY  01/14/2013   breast reduction left side  . CESAREAN SECTION     two times  . COLONOSCOPY    . FOOT SURGERY Right     Family History  Problem Relation Age of Onset  . Thyroid disease Mother   . Hypertension Mother   . Hyperlipidemia Mother   . Alzheimer's disease Mother   . Heart failure Mother   . Esophageal cancer Father   . Hyperlipidemia Father   . Heart murmur Father   . Colon polyps Father   . Diabetes Sister   . Alzheimer's disease Maternal Aunt   . Alzheimer's disease Maternal Uncle   . Diabetes Paternal Uncle   . Alzheimer's disease Maternal Grandmother   . Diabetes Paternal Grandmother   . Breast cancer Sister   . Irritable bowel syndrome Sister   . Ulcerative colitis Sister     ????  . Colon cancer Neg Hx     Social History   Social History  . Marital status: Married    Spouse name: N/A  . Number of children: N/A  .  Years of education: N/A   Occupational History  . Not on file.   Social History Main Topics  . Smoking status: Never Smoker  . Smokeless tobacco: Never Used  . Alcohol use No  . Drug use: No  . Sexual activity: Yes    Partners: Male    Birth control/ protection: Surgical   Other Topics Concern  . Not on file   Social History Narrative  . No narrative on file   The PMH, PSH, Social History, Family History, Medications, and allergies have been reviewed in [facility], and have been updated if relevant.   Review of Systems  Constitutional: Negative.   Cardiovascular: Negative.   Gastrointestinal: Negative.   Neurological: Negative.   Hematological: Positive for adenopathy.  Psychiatric/Behavioral: Negative.   All other systems reviewed and are negative.      Objective:    BP 138/88 (BP Location: Left Arm,  Patient Position: Sitting, Cuff Size: Large)   Pulse (!) 113   Temp 98.7 F (37.1 C) (Oral)   Wt 177 lb (80.3 kg)   SpO2 95%   BMI 29.45 kg/m    Physical Exam  Constitutional: She is oriented to person, place, and time. She appears well-developed and well-nourished. No distress.  HENT:  Head: Normocephalic and atraumatic.  Eyes: Conjunctivae are normal.  Cardiovascular: Normal rate.   Pulmonary/Chest: Effort normal. Right breast exhibits no inverted nipple, no mass, no nipple discharge, no skin change and no tenderness. Left breast exhibits no inverted nipple, no mass, no nipple discharge, no skin change and no tenderness.  Lymphadenopathy:    She has axillary adenopathy.       Right axillary: Lateral adenopathy present.       Left axillary: No pectoral and no lateral adenopathy present. Neurological: She is alert and oriented to person, place, and time. No cranial nerve deficit.  Skin: Skin is warm and dry. She is not diaphoretic.  Psychiatric: She has a normal mood and affect. Her behavior is normal. Judgment and thought content normal.  Nursing note and vitals reviewed.         Assessment & Plan:   Axillary lump, right - Plan: MM DIAG BREAST TOMO BILATERAL, MM DIAG BREAST TOMO UNI RIGHT  Axillary mass, right  Malignant neoplasm of central portion of right female breast, unspecified estrogen receptor status ([place]) No Follow-up on file.

## 2017-01-22 ENCOUNTER — Ambulatory Visit
Admission: RE | Admit: 2017-01-22 | Discharge: 2017-01-22 | Disposition: A | Discharge status: Home / Self Care | Payer: BLUE CROSS/BLUE SHIELD | Source: Ambulatory Visit | Attending: Family Medicine | Admitting: Family Medicine

## 2017-01-22 ENCOUNTER — Other Ambulatory Visit: Payer: BLUE CROSS/BLUE SHIELD

## 2017-01-22 DIAGNOSIS — R2231 Localized swelling, mass and lump, right upper limb: Secondary | ICD-10-CM

## 2017-01-22 HISTORY — DX: Personal history of irradiation: Z92.3

## 2017-02-09 ENCOUNTER — Other Ambulatory Visit: Payer: Self-pay | Admitting: Family Medicine

## 2017-02-09 DIAGNOSIS — J309 Allergic rhinitis, unspecified: Secondary | ICD-10-CM

## 2017-04-18 ENCOUNTER — Encounter: Payer: Self-pay | Admitting: Family Medicine

## 2017-04-21 ENCOUNTER — Ambulatory Visit: Payer: BLUE CROSS/BLUE SHIELD | Admitting: Family Medicine

## 2017-04-21 ENCOUNTER — Other Ambulatory Visit: Payer: Self-pay | Admitting: Family Medicine

## 2017-04-21 MED ORDER — CIPROFLOXACIN HCL 250 MG PO TABS: 250.0000 mg | ORAL_TABLET | Freq: Two times a day (BID) | ORAL | 0 refills | Status: DC

## 2017-05-11 ENCOUNTER — Other Ambulatory Visit: Payer: Self-pay | Admitting: Family Medicine

## 2017-05-11 DIAGNOSIS — Z01419 Encounter for gynecological examination (general) (routine) without abnormal findings: Secondary | ICD-10-CM

## 2017-05-11 DIAGNOSIS — E538 Deficiency of other specified B group vitamins: Secondary | ICD-10-CM

## 2017-05-16 ENCOUNTER — Other Ambulatory Visit (INDEPENDENT_AMBULATORY_CARE_PROVIDER_SITE_OTHER): Payer: BLUE CROSS/BLUE SHIELD

## 2017-05-16 DIAGNOSIS — Z01419 Encounter for gynecological examination (general) (routine) without abnormal findings: Secondary | ICD-10-CM

## 2017-05-16 DIAGNOSIS — E538 Deficiency of other specified B group vitamins: Secondary | ICD-10-CM | POA: Diagnosis not present

## 2017-05-16 LAB — CBC WITH DIFFERENTIAL/PLATELET
BASOS PCT: 0.6 % (ref 0.0–3.0)
Basophils Absolute: 0 10*3/uL (ref 0.0–0.1)
EOS PCT: 3.7 % (ref 0.0–5.0)
Eosinophils Absolute: 0.2 10*3/uL (ref 0.0–0.7)
HEMATOCRIT: 37.6 % (ref 36.0–46.0)
Hemoglobin: 12.4 g/dL (ref 12.0–15.0)
LYMPHS ABS: 1.2 10*3/uL (ref 0.7–4.0)
LYMPHS PCT: 28.3 % (ref 12.0–46.0)
MCHC: 32.9 g/dL (ref 30.0–36.0)
MCV: 86.7 fl (ref 78.0–100.0)
MONOS PCT: 8.1 % (ref 3.0–12.0)
Monocytes Absolute: 0.3 10*3/uL (ref 0.1–1.0)
NEUTROS ABS: 2.5 10*3/uL (ref 1.4–7.7)
Neutrophils Relative %: 59.3 % (ref 43.0–77.0)
PLATELETS: 250 10*3/uL (ref 150.0–400.0)
RBC: 4.34 Mil/uL (ref 3.87–5.11)
RDW: 14.1 % (ref 11.5–15.5)
WBC: 4.2 10*3/uL (ref 4.0–10.5)

## 2017-05-16 LAB — LIPID PANEL
CHOL/HDL RATIO: 6
Cholesterol: 288 mg/dL — ABNORMAL HIGH (ref 0–200)
HDL: 51.2 mg/dL (ref >39.00)
LDL Cholesterol: 201 mg/dL — ABNORMAL HIGH (ref 0–99)
NonHDL: 236.48
TRIGLYCERIDES: 179 mg/dL — AB (ref 0.0–149.0)
VLDL: 35.8 mg/dL (ref 0.0–40.0)

## 2017-05-16 LAB — VITAMIN B12: Vitamin B-12: 281 pg/mL (ref 211–911)

## 2017-05-16 LAB — COMPREHENSIVE METABOLIC PANEL
ALT: 14 U/L (ref 0–35)
AST: 18 U/L (ref 0–37)
Albumin: 4.2 g/dL (ref 3.5–5.2)
Alkaline Phosphatase: 64 U/L (ref 39–117)
BILIRUBIN TOTAL: 0.4 mg/dL (ref 0.2–1.2)
BUN: 19 mg/dL (ref 6–23)
CALCIUM: 9.3 mg/dL (ref 8.4–10.5)
CHLORIDE: 103 meq/L (ref 96–112)
CO2: 27 meq/L (ref 19–32)
Creatinine, Ser: 0.7 mg/dL (ref 0.40–1.20)
GFR: 90.5 mL/min (ref >60.00)
Glucose, Bld: 101 mg/dL — ABNORMAL HIGH (ref 70–99)
POTASSIUM: 3.9 meq/L (ref 3.5–5.1)
Sodium: 138 mEq/L (ref 135–145)
Total Protein: 7 g/dL (ref 6.0–8.3)

## 2017-05-16 LAB — TSH: TSH: 1.93 u[IU]/mL (ref 0.35–4.50)

## 2017-05-20 ENCOUNTER — Encounter: Payer: Self-pay | Admitting: Family Medicine

## 2017-05-20 ENCOUNTER — Ambulatory Visit (INDEPENDENT_AMBULATORY_CARE_PROVIDER_SITE_OTHER): Payer: BLUE CROSS/BLUE SHIELD | Admitting: Family Medicine

## 2017-05-20 VITALS — BP 126/82 | HR 97 | Temp 98.1°F | Resp 16 | Ht 64.5 in | Wt 181.0 lb

## 2017-05-20 DIAGNOSIS — F329 Major depressive disorder, single episode, unspecified: Secondary | ICD-10-CM | POA: Diagnosis not present

## 2017-05-20 DIAGNOSIS — E785 Hyperlipidemia, unspecified: Secondary | ICD-10-CM

## 2017-05-20 DIAGNOSIS — Z01411 Encounter for gynecological examination (general) (routine) with abnormal findings: Secondary | ICD-10-CM | POA: Diagnosis not present

## 2017-05-20 DIAGNOSIS — G44229 Chronic tension-type headache, not intractable: Secondary | ICD-10-CM

## 2017-05-20 DIAGNOSIS — F32A Depression, unspecified: Secondary | ICD-10-CM

## 2017-05-20 DIAGNOSIS — Z01419 Encounter for gynecological examination (general) (routine) without abnormal findings: Secondary | ICD-10-CM

## 2017-05-20 DIAGNOSIS — G43009 Migraine without aura, not intractable, without status migrainosus: Secondary | ICD-10-CM

## 2017-05-20 MED ORDER — ESCITALOPRAM OXALATE 20 MG PO TABS: 20.0000 mg | ORAL_TABLET | Freq: Every day | ORAL | 6 refills | Status: DC

## 2017-05-20 MED ORDER — IBUPROFEN 800 MG PO TABS: 800.0000 mg | ORAL_TABLET | Freq: Three times a day (TID) | ORAL | 3 refills | Status: DC | PRN

## 2017-05-20 MED ORDER — PRAVASTATIN SODIUM 20 MG PO TABS: ORAL_TABLET | ORAL | 3 refills | Status: DC

## 2017-05-20 MED ORDER — ZOLMITRIPTAN 5 MG PO TABS: 5.0000 mg | ORAL_TABLET | ORAL | 3 refills | Status: DC | PRN

## 2017-05-20 MED ORDER — PROMETHAZINE HCL 25 MG PO TABS: 25.0000 mg | ORAL_TABLET | Freq: Four times a day (QID) | ORAL | 1 refills | Status: DC | PRN

## 2017-05-20 NOTE — Assessment & Plan Note (Signed)
Reviewed preventive care protocols, scheduled due services, and updated immunizations Discussed nutrition, exercise, diet, and healthy lifestyle.  

## 2017-05-20 NOTE — Assessment & Plan Note (Signed)
Well controlled on current dose of lexapro. No changes made today. 

## 2017-05-20 NOTE — Progress Notes (Signed)
Subjective:   Patient ID: [name], female    DOB: [birth year], 61 y.o.   MRN: [id number]  [name] is a pleasant 61 y.o. year old female who presents to clinic today with Annual Exam and follow up of chronic medical conditions on 05/20/2017  HPI:  Remote h/o hysterectomy Colonoscopy 02/2009 Mammogram 01/22/17 Tdap 08/31/13  Depression-currently taking Lexapro 20 mg daily  for depression and post menopausal symptoms.   HLD- deteriorated. Had improved with Pravachol but she wanted to see if she could control it with diet.  Pravachol did not cause myalgias.  Could not tolerate Crestor or Zocor.  Both caused muscle aches.  Lab Results  Component Value Date   CHOL 288 (H) 05/16/2017   HDL 51.20 05/16/2017   LDLCALC 201 (H) 05/16/2017   LDLDIRECT 208.2 08/31/2013   TRIG 179.0 (H) 05/16/2017   CHOLHDL 6 05/16/2017    Lab Results  Component Value Date   WBC 4.2 05/16/2017   HGB 12.4 05/16/2017   HCT 37.6 05/16/2017   MCV 86.7 05/16/2017   PLT 250.0 05/16/2017   Lab Results  Component Value Date   TSH 1.93 05/16/2017   Lab Results  Component Value Date   ALT 14 05/16/2017   AST 18 05/16/2017   ALKPHOS 64 05/16/2017   BILITOT 0.4 05/16/2017    Lab Results  Component Value Date   CREATININE 0.70 05/16/2017   Current Outpatient Prescriptions on File Prior to Visit  Medication Sig Dispense Refill  . ciprofloxacin (CIPRO) 250 MG tablet Take 1 tablet (250 mg total) by mouth 2 (two) times daily. 10 tablet 0  . escitalopram (LEXAPRO) 20 MG tablet Take 1 tablet (20 mg total) by mouth daily. 30 tablet 6  . fluticasone (FLONASE) 50 MCG/ACT nasal spray PLACE 2 SPRAYS INTO BOTH NOSTRILS DAILY. 16 g 6  . ibuprofen (ADVIL,MOTRIN) 800 MG tablet Take 1 tablet (800 mg total) by mouth every 8 (eight) hours as needed. 60 tablet 3  . Multiple Vitamin (MULTIVITAMIN) tablet Take 1 tablet by mouth daily.    [name] omeprazole (PRILOSEC) 20 MG capsule Take 1 capsule (20 mg total) by  mouth daily. 90 capsule 2  . promethazine (PHENERGAN) 25 MG tablet Take 1 tablet (25 mg total) by mouth every 6 (six) hours as needed for nausea or vomiting. 30 tablet 1  . traMADol (ULTRAM) 50 MG tablet Take 1 tablet (50 mg total) by mouth every 6 (six) hours as needed. 40 tablet 2  . zolmitriptan (ZOMIG) 5 MG tablet TAKE 1 TABLET (5 MG TOTAL) BY MOUTH AS NEEDED FOR MIGRAINE. 10 tablet 3   No current facility-administered medications on file prior to visit.     Allergies  Allergen Reactions  . Statins Other (See Comments)    Muscle aches    Past Medical History:  Diagnosis Date  . Anemia   . Anxiety   . Blood transfusion without reported diagnosis   . Breast cancer ([place]) 02/27/2007  . Hypercholesterolemia   . Migraine   . Personal history of radiation therapy     Past Surgical History:  Procedure Laterality Date  . ABDOMINAL HYSTERECTOMY    . BREAST BIOPSY Right 02/27/2007  . BREAST LUMPECTOMY Right 04/02/2007  . BREAST SURGERY  01/14/2013   breast reduction left side  . CESAREAN SECTION     two times  . COLONOSCOPY    . FOOT SURGERY Right     Family History  Problem Relation Age of Onset  .  Thyroid disease Mother   . Hypertension Mother   . Hyperlipidemia Mother   . Alzheimer's disease Mother   . Heart failure Mother   . Esophageal cancer Father   . Hyperlipidemia Father   . Heart murmur Father   . Colon polyps Father   . Diabetes Sister   . Alzheimer's disease Maternal Aunt   . Alzheimer's disease Maternal Uncle   . Diabetes Paternal Uncle   . Alzheimer's disease Maternal Grandmother   . Diabetes Paternal Grandmother   . Breast cancer Sister   . Irritable bowel syndrome Sister   . Ulcerative colitis Sister        ????  . Colon cancer Neg Hx     Social History   Social History  . Marital status: Married    Spouse name: N/A  . Number of children: N/A  . Years of education: N/A   Occupational History  . Not on file.   Social History Main Topics   . Smoking status: Never Smoker  . Smokeless tobacco: Never Used  . Alcohol use No  . Drug use: No  . Sexual activity: Yes    Partners: Male    Birth control/ protection: Surgical   Other Topics Concern  . Not on file   Social History Narrative  . No narrative on file   The PMH, PSH, Social History, Family History, Medications, and allergies have been reviewed in [facility], and have been updated if relevant.    Review of Systems  Constitutional: Negative.   HENT: Negative.   Respiratory: Negative.   Cardiovascular: Negative.   Gastrointestinal: Negative.   Endocrine: Negative.   Genitourinary: Negative.   Musculoskeletal: Negative.   Skin: Negative.   Allergic/Immunologic: Negative.   Neurological: Negative.   Hematological: Negative.   Psychiatric/Behavioral: Negative.   All other systems reviewed and are negative.      Objective:    BP 126/82   Pulse 97   Temp 98.1 F (36.7 C) (Oral)   Resp 16   Ht 5' 4.5" (1.638 m)   Wt 181 lb (82.1 kg)   SpO2 100%   BMI 30.59 kg/m    Physical Exam   General:  Well-developed,well-nourished,in no acute distress; alert,appropriate and cooperative throughout examination Head:  normocephalic and atraumatic.   Eyes:  vision grossly intact, pupils equal, pupils round, and pupils reactive to light.   Ears:  R ear normal and L ear normal.   Nose:  no external deformity.   Mouth:  good dentition.   Neck:  No deformities, masses, or tenderness noted. Breasts:  No mass, nodules, thickening, tenderness, bulging, retraction, inflamation, nipple discharge or skin changes noted.   Lungs:  Normal respiratory effort, chest expands symmetrically. Lungs are clear to auscultation, no crackles or wheezes. Heart:  Normal rate and regular rhythm. S1 and S2 normal without gallop, murmur, click, rub or other extra sounds. Abdomen:  Bowel sounds positive,abdomen soft and non-tender without masses, organomegaly or hernias noted. Msk:  No deformity  or scoliosis noted of thoracic or lumbar spine.   Extremities:  No clubbing, cyanosis, edema, or deformity noted with normal full range of motion of all joints.   Neurologic:  alert & oriented X3 and gait normal.   Skin:  Intact without suspicious lesions or rashes Cervical Nodes:  No lymphadenopathy noted Axillary Nodes:  No palpable lymphadenopathy Psych:  Cognition and judgment appear intact. Alert and cooperative with normal attention span and concentration. No apparent delusions, illusions, hallucinations  Assessment & Plan:   Well woman exam  Hyperlipidemia, unspecified hyperlipidemia type  Depression, unspecified depression type No Follow-up on file.

## 2017-05-20 NOTE — Patient Instructions (Signed)
Great to see you.  Please call to schedule your mammogram.  Please restart your pravachol.

## 2017-05-20 NOTE — Assessment & Plan Note (Signed)
Deteriorated. She will restart pravachol-eRx refills sent.

## 2017-06-02 ENCOUNTER — Encounter: Payer: Self-pay | Admitting: Family Medicine

## 2017-06-04 ENCOUNTER — Other Ambulatory Visit: Payer: Self-pay | Admitting: Family Medicine

## 2017-06-09 ENCOUNTER — Encounter: Payer: Self-pay | Admitting: *Deleted

## 2017-06-09 ENCOUNTER — Encounter: Payer: Self-pay | Admitting: Family Medicine

## 2017-06-09 ENCOUNTER — Ambulatory Visit (INDEPENDENT_AMBULATORY_CARE_PROVIDER_SITE_OTHER): Payer: BLUE CROSS/BLUE SHIELD | Admitting: Family Medicine

## 2017-06-09 VITALS — BP 142/90 | HR 75 | Temp 98.6°F | Wt 180.8 lb

## 2017-06-09 DIAGNOSIS — I83813 Varicose veins of bilateral lower extremities with pain: Secondary | ICD-10-CM | POA: Diagnosis not present

## 2017-06-09 NOTE — Assessment & Plan Note (Signed)
This is now more than a cosmetic issue and should be treated as such by her insurance company.  Seeing a vein specialist and planning on having these veins repaired and I agree this is medically indicated.

## 2017-06-09 NOTE — Progress Notes (Signed)
Subjective:   Patient ID: [name], female    DOB: 09/03/1956, 61 y.o.   MRN: [id number]  CRISTAL QADIR is a pleasant 61 y.o. year old female who presents to clinic today with Leg Pain  on 06/09/2017  HPI:  Has had pain in her calves and lower legs for years.  H/o varicose veins  And pain in her feet.  Mother and sister have varicose veins as well.  Has been to vein specialist and was told that her veins are "closed off" and needs procedures done.  She has mentioned pain in her legs for years and has seen our sports medicine specialist as well for leg pain years ago.  Current Outpatient Prescriptions on File Prior to Visit  Medication Sig Dispense Refill  . escitalopram (LEXAPRO) 20 MG tablet Take 1 tablet (20 mg total) by mouth daily. 30 tablet 6  . fluticasone (FLONASE) 50 MCG/ACT nasal spray PLACE 2 SPRAYS INTO BOTH NOSTRILS DAILY. 16 g 6  . ibuprofen (ADVIL,MOTRIN) 800 MG tablet Take 1 tablet (800 mg total) by mouth every 8 (eight) hours as needed. 60 tablet 3  . Multiple Vitamin (MULTIVITAMIN) tablet Take 1 tablet by mouth daily.    [name] omeprazole (PRILOSEC) 20 MG capsule TAKE 1 CAPSULE (20 MG TOTAL) BY MOUTH DAILY. 90 capsule 2  . pravastatin (PRAVACHOL) 20 MG tablet TAKE 1 TABLET (20 MG TOTAL) BY MOUTH DAILY. 90 tablet 3  . promethazine (PHENERGAN) 25 MG tablet Take 1 tablet (25 mg total) by mouth every 6 (six) hours as needed for nausea or vomiting. 30 tablet 1  . traMADol (ULTRAM) 50 MG tablet Take 1 tablet (50 mg total) by mouth every 6 (six) hours as needed. 40 tablet 2  . zolmitriptan (ZOMIG) 5 MG tablet Take 1 tablet (5 mg total) by mouth as needed for migraine. 10 tablet 3   No current facility-administered medications on file prior to visit.     Allergies  Allergen Reactions  . Statins Other (See Comments)    Muscle aches    Past Medical History:  Diagnosis Date  . Anemia   . Anxiety   . Blood transfusion without reported diagnosis   . Breast cancer  ([place]) 02/27/2007  . Hypercholesterolemia   . Migraine   . Personal history of radiation therapy     Past Surgical History:  Procedure Laterality Date  . ABDOMINAL HYSTERECTOMY    . BREAST BIOPSY Right 02/27/2007  . BREAST LUMPECTOMY Right 04/02/2007  . BREAST SURGERY  01/14/2013   breast reduction left side  . CESAREAN SECTION     two times  . COLONOSCOPY    . FOOT SURGERY Right     Family History  Problem Relation Age of Onset  . Thyroid disease Mother   . Hypertension Mother   . Hyperlipidemia Mother   . Alzheimer's disease Mother   . Heart failure Mother   . Esophageal cancer Father   . Hyperlipidemia Father   . Heart murmur Father   . Colon polyps Father   . Diabetes Sister   . Alzheimer's disease Maternal Aunt   . Alzheimer's disease Maternal Uncle   . Diabetes Paternal Uncle   . Alzheimer's disease Maternal Grandmother   . Diabetes Paternal Grandmother   . Breast cancer Sister   . Irritable bowel syndrome Sister   . Ulcerative colitis Sister        ????  . Colon cancer Neg Hx     Social History  Social History  . Marital status: Married    Spouse name: N/A  . Number of children: N/A  . Years of education: N/A   Occupational History  . Not on file.   Social History Main Topics  . Smoking status: Never Smoker  . Smokeless tobacco: Never Used  . Alcohol use No  . Drug use: No  . Sexual activity: Yes    Partners: Male    Birth control/ protection: Surgical   Other Topics Concern  . Not on file   Social History Narrative  . No narrative on file   The PMH, PSH, Social History, Family History, Medications, and allergies have been reviewed in [facility], and have been updated if relevant.   Review of Systems  Musculoskeletal: Positive for gait problem and myalgias.  All other systems reviewed and are negative.      Objective:    BP (!) 142/90 (BP Location: Left Arm, Patient Position: Sitting, Cuff Size: Normal)   Pulse 75   Temp 98.6 F (37  C) (Oral)   Wt 180 lb 12 oz (82 kg)   SpO2 95%   BMI 30.55 kg/m    Physical Exam   General:  Well-developed,well-nourished,in no acute distress; alert,appropriate and cooperative throughout examination Head:  normocephalic and atraumatic.   Eyes:  vision grossly intact, PERRL Ears:  R ear normal and L ear normal externally Nose:  no external deformity.   Mouth:  good dentition.   Neck:  No deformities, masses, or tenderness noted. Lungs:  Normal respiratory effort, chest expands symmetrically. Lungs are clear to auscultation, no crackles or wheezes. Heart:  Normal rate and regular rhythm. S1 and S2 normal without gallop, murmur, click, rub or other extra sounds. Msk:  No deformity or scoliosis noted of thoracic or lumbar spine.   Extremities:  No clubbing, cyanosis, edema, or deformity noted with normal full range of motion of all joints.  +bilateral leg varicosities, behind knees and lower legs  Neurologic:  alert & oriented X3 and gait normal.   Skin:  Intact without suspicious lesions or rashes Psych:  Cognition and judgment appear intact. Alert and cooperative with normal attention span and concentration. No apparent delusions, illusions, hallucinations       Assessment & Plan:   Varicose veins of bilateral lower extremities with pain No Follow-up on file.

## 2017-06-12 NOTE — Discharge Instructions (Signed)
INSTRUCTIONS FOLLOWING OCULOPLASTIC SURGERY °AMY M. FOWLER, MD ° °AFTER YOUR EYE SURGERY, THER ARE MANY THINGS THWIHC YOU, THE PATIENT, CAN DO TO ASSURE THE BEST POSSIBLE RESULT FROM YOUR OPERATION.  THIS SHEET SHOULD BE REFERRED TO WHENEVER QUESTIONS ARISE.  IF THERE ARE ANY QUESTIONS NOT ANSWERED HERE, DO NOT HESITATE TO CALL OUR OFFICE AT [phone] OR [phone].  THERE IS ALWAYS [facility] AVAILABLE TO CALL IF QUESTIONS OR PROBLEMS ARISE. ° °VISION: Your vision may be blurred and out of focus after surgery until you are able to stop using your ointment, swelling resolves and your eye(s) heal. This may take 1 to 2 weeks at the least.  If your vision becomes gradually more dim or dark, this is not normal and you need to call our office immediately. ° °EYE CARE: For the first 48 hours after surgery, use ice packs frequently - “20 minutes on, 20 minutes off” - to help reduce swelling and bruising.  Small bags of frozen peas or corn make good ice packs along with cloths soaked in ice water.  If you are wearing a patch or other type of dressing following surgery, keep this on for the amount of time specified by your doctor.  For the first week following surgery, you will need to treat your stitches with great care.  If is OK to shower, but take care to not allow soapy water to run into your eye(s) to help reduce changes of infection.  You may gently clean the eyelashes and around the eye(s) with cotton balls and sterile water, BUT DO NOT RUB THE STITCHES VIGOROUSLY.  Keeping your stitches moist with ointment will help promote healing with minimal scar formation. ° °ACTIVITY: When you leave the surgery center, you should go home, rest and be inactive.  The eye(s) may feel scratchy and keeping the eyes closed will allow for faster healing.  The first week following surgery, avoid straining (anything making the face turn red) or lifting over 20 pounds.  Additionally, avoid bending which causes your head to go below  your waist.  Using your eyes will NOT harm them, so feel free to read, watch television, use the computer, etc as desired.  Driving depends on each individual, so check with your doctor if you have questions about driving. ° °MEDICATIONS:  You will be given a prescription for an ointment to use 4 times a day on your stitches.  You can use the ointment in your eyes if they feel scratchy or irritated.  If you eyelid(s) don’t close completely when you sleep, put some ointment in your eyes before bedtime. ° °EMERGENCY: If you experience SEVERE EYE PAIN OR HEADACHE UNRELIEVED BY TYLENOL OR PERCOCET, NAUSEA OR VOMITING, WORSENING REDNESS, OR WORSENING VISION (ESPECIALLY VISION THAT WA INITIALLY BETTER) CALL [phone] OR [phone] DURING BUSINESS HOURS OR AFTER HOURS. ° °General Anesthesia, Adult, Care After °These instructions provide you with information about caring for yourself after your procedure. Your health care provider may also give you more specific instructions. Your treatment has been planned according to current medical practices, but problems sometimes occur. Call your health care provider if you have any problems or questions after your procedure. °What can I expect after the procedure? °After the procedure, it is common to have: °· Vomiting. °· A sore throat. °· Mental slowness. ° °It is common to feel: °· Nauseous. °· Cold or shivery. °· Sleepy. °· Tired. °· Sore or achy, even in parts of your body where you did not have surgery. ° °  Follow these instructions at home: °For at least 24 hours after the procedure: °· Do not: °? Participate in activities where you could fall or become injured. °? Drive. °? Use heavy machinery. °? Drink alcohol. °? Take sleeping pills or medicines that cause drowsiness. °? Make important decisions or sign legal documents. °? Take care of children on your own. °· Rest. °Eating and drinking °· If you vomit, drink water, juice, or soup when you can drink without  vomiting. °· Drink enough fluid to keep your urine clear or pale yellow. °· Make sure you have little or no nausea before eating solid foods. °· Follow the diet recommended by your health care provider. °General instructions °· Have a responsible adult stay with you until you are awake and alert. °· Return to your normal activities as told by your health care provider. Ask your health care provider what activities are safe for you. °· Take over-the-counter and prescription medicines only as told by your health care provider. °· If you smoke, do not smoke without supervision. °· Keep all follow-up visits as told by your health care provider. This is important. °Contact a health care provider if: °· You continue to have nausea or vomiting at home, and medicines are not helpful. °· You cannot drink fluids or start eating again. °· You cannot urinate after 8-12 hours. °· You develop a skin rash. °· You have fever. °· You have increasing redness at the site of your procedure. °Get help right away if: °· You have difficulty breathing. °· You have chest pain. °· You have unexpected bleeding. °· You feel that you are having a life-threatening or urgent problem. °This information is not intended to replace advice given to you by your health care provider. Make sure you discuss any questions you have with your health care provider. °Document Released: 12/09/2000 Document Revised: 02/05/2016 Document Reviewed: 08/17/2015 °Elsevier Interactive Patient Education © 2018 [employer] ° °

## 2017-06-17 ENCOUNTER — Ambulatory Visit
Admission: RE | Admit: 2017-06-17 | Discharge: 2017-06-17 | Disposition: A | Discharge status: Home / Self Care | Payer: BLUE CROSS/BLUE SHIELD | Source: Ambulatory Visit | Attending: Ophthalmology | Admitting: Ophthalmology

## 2017-06-17 ENCOUNTER — Encounter
Admission: RE | Disposition: A | Discharge status: Home / Self Care | Payer: Self-pay | Source: Ambulatory Visit | Attending: Ophthalmology

## 2017-06-17 ENCOUNTER — Ambulatory Visit: Payer: BLUE CROSS/BLUE SHIELD | Admitting: Anesthesiology

## 2017-06-17 DIAGNOSIS — H02834 Dermatochalasis of left upper eyelid: Secondary | ICD-10-CM | POA: Insufficient documentation

## 2017-06-17 DIAGNOSIS — H02403 Unspecified ptosis of bilateral eyelids: Secondary | ICD-10-CM | POA: Insufficient documentation

## 2017-06-17 DIAGNOSIS — H02831 Dermatochalasis of right upper eyelid: Secondary | ICD-10-CM | POA: Diagnosis not present

## 2017-06-17 HISTORY — PX: BROW LIFT: SHX178

## 2017-06-17 HISTORY — DX: Gastro-esophageal reflux disease without esophagitis: K21.9

## 2017-06-17 HISTORY — PX: PTOSIS REPAIR: SHX6568

## 2017-06-17 HISTORY — DX: Presence of dental prosthetic device (complete) (partial): Z97.2

## 2017-06-17 SURGERY — BLEPHAROPLASTY
Anesthesia: Monitor Anesthesia Care | Laterality: Bilateral | Wound class: Clean

## 2017-06-17 MED ORDER — TETRACAINE HCL 0.5 % OP SOLN
OPHTHALMIC | Status: DC | PRN
  Administered 2017-06-17: 2 [drp] via OPHTHALMIC

## 2017-06-17 MED ORDER — ALFENTANIL 500 MCG/ML IJ INJ
INJECTION | INTRAVENOUS | Status: DC | PRN
  Administered 2017-06-17: 300 ug via INTRAVENOUS
  Administered 2017-06-17: 700 ug via INTRAVENOUS

## 2017-06-17 MED ORDER — ERYTHROMYCIN 5 MG/GM OP OINT: TOPICAL_OINTMENT | OPHTHALMIC | 3 refills | Status: DC

## 2017-06-17 MED ORDER — PROPOFOL 500 MG/50ML IV EMUL
INTRAVENOUS | Status: DC | PRN
  Administered 2017-06-17: 25 ug/kg/min via INTRAVENOUS

## 2017-06-17 MED ORDER — ERYTHROMYCIN 5 MG/GM OP OINT
TOPICAL_OINTMENT | OPHTHALMIC | Status: DC | PRN
  Administered 2017-06-17: 1 via OPHTHALMIC

## 2017-06-17 MED ORDER — ACETAMINOPHEN 160 MG/5ML PO SOLN: 325.0000 mg | Freq: Once | ORAL | Status: DC

## 2017-06-17 MED ORDER — LIDOCAINE HCL (CARDIAC) 20 MG/ML IV SOLN
INTRAVENOUS | Status: DC | PRN
  Administered 2017-06-17: 40 mg via INTRAVENOUS

## 2017-06-17 MED ORDER — OXYCODONE HCL 5 MG/5ML PO SOLN: 5.0000 mg | Freq: Once | ORAL | Status: DC | PRN

## 2017-06-17 MED ORDER — OXYCODONE HCL 5 MG PO TABS: 5.0000 mg | ORAL_TABLET | Freq: Once | ORAL | Status: DC | PRN

## 2017-06-17 MED ORDER — BSS IO SOLN
INTRAOCULAR | Status: DC | PRN
  Administered 2017-06-17: 15 mL

## 2017-06-17 MED ORDER — OXYCODONE-ACETAMINOPHEN 5-325 MG PO TABS: 1.0000 | ORAL_TABLET | ORAL | 0 refills | Status: DC | PRN

## 2017-06-17 MED ORDER — MIDAZOLAM HCL 2 MG/2ML IJ SOLN
INTRAMUSCULAR | Status: DC | PRN
  Administered 2017-06-17: 2 mg via INTRAVENOUS

## 2017-06-17 MED ORDER — LACTATED RINGERS IV SOLN
INTRAVENOUS | Status: DC
  Administered 2017-06-17: 09:00:00 via INTRAVENOUS

## 2017-06-17 MED ORDER — HYALURONIDASE HUMAN 150 UNIT/ML IJ SOLN
INTRAMUSCULAR | Status: DC | PRN
  Administered 2017-06-17: 3 mL via OPHTHALMIC

## 2017-06-17 MED ORDER — ACETAMINOPHEN 325 MG PO TABS: 325.0000 mg | ORAL_TABLET | Freq: Once | ORAL | Status: DC

## 2017-06-17 SURGICAL SUPPLY — 36 items
APPLICATOR COTTON TIP WD 3 STR (MISCELLANEOUS) ×4 IMPLANT
BLADE SURG 15 STRL LF DISP TIS (BLADE) ×1 IMPLANT
BLADE SURG 15 STRL SS (BLADE) ×1
CORD BIP STRL DISP 12FT (MISCELLANEOUS) ×2 IMPLANT
DRAPE HEAD BAR (DRAPES) ×2 IMPLANT
GAUZE SPONGE 4X4 12PLY STRL (GAUZE/BANDAGES/DRESSINGS) ×2 IMPLANT
GAUZE SPONGE NON-WVN 2X2 STRL (MISCELLANEOUS) ×10 IMPLANT
GLOVE SURG LX 7.0 MICRO (GLOVE) ×2
GLOVE SURG LX STRL 7.0 MICRO (GLOVE) ×2 IMPLANT
MARKER SKIN XFINE TIP W/RULER (MISCELLANEOUS) ×2 IMPLANT
NEEDLE FILTER BLUNT 18X 1/2SAF (NEEDLE) ×1
NEEDLE FILTER BLUNT 18X1 1/2 (NEEDLE) ×1 IMPLANT
NEEDLE HYPO 30X.5 LL (NEEDLE) ×4 IMPLANT
PACK DRAPE NASAL/ENT (PACKS) ×2 IMPLANT
SOL PREP PVP 2OZ (MISCELLANEOUS) ×2
SOLUTION PREP PVP 2OZ (MISCELLANEOUS) ×1 IMPLANT
SPONGE VERSALON 2X2 STRL (MISCELLANEOUS) ×10
SUT CHROMIC 4-0 (SUTURE)
SUT CHROMIC 4-0 M2 12X2 ARM (SUTURE)
SUT CHROMIC 5 0 P 3 (SUTURE) IMPLANT
SUT ETHILON 4 0 CL P 3 (SUTURE) IMPLANT
SUT MERSILENE 4-0 S-2 (SUTURE) IMPLANT
SUT PDS AB 4-0 P3 18 (SUTURE) IMPLANT
SUT PLAIN GUT (SUTURE) ×2 IMPLANT
SUT PROLENE 5 0 P 3 (SUTURE) ×2 IMPLANT
SUT PROLENE 6 0 P 1 18 (SUTURE) ×2 IMPLANT
SUT SILK 4 0 G 3 (SUTURE) IMPLANT
SUT VIC AB 5-0 P-3 18X BRD (SUTURE) IMPLANT
SUT VIC AB 5-0 P3 18 (SUTURE)
SUT VICRYL 6-0  S14 CTD (SUTURE)
SUT VICRYL 6-0 S14 CTD (SUTURE) IMPLANT
SUT VICRYL 7 0 TG140 8 (SUTURE) IMPLANT
SUTURE CHRMC 4-0 M2 12X2 ARM (SUTURE) IMPLANT
SYR 3ML LL SCALE MARK (SYRINGE) ×2 IMPLANT
SYRINGE 10CC LL (SYRINGE) ×2 IMPLANT
WATER STERILE IRR 250ML POUR (IV SOLUTION) ×2 IMPLANT

## 2017-06-17 NOTE — Anesthesia Postprocedure Evaluation (Signed)
Anesthesia Post Note  Patient: [name]  Procedure(s) Performed: BLEPHAROPLASTY UPPER EYELID WITH EXCESS SKIN (Bilateral ) PTOSIS REPAIR RESECT EX (Bilateral )  Patient location during evaluation: PACU Anesthesia Type: MAC Level of consciousness: awake and alert and oriented Pain management: satisfactory to patient Vital Signs Assessment: post-procedure vital signs reviewed and stable Respiratory status: spontaneous breathing, nonlabored ventilation and respiratory function stable Cardiovascular status: blood pressure returned to baseline and stable Postop Assessment: Adequate PO intake and No signs of nausea or vomiting Anesthetic complications: no    Raliegh Ip

## 2017-06-17 NOTE — Op Note (Signed)
Preoperative Diagnosis:  1. Visually significant blepharoptosis bilateral Upper Eyelid(s) 2. Visually significant dermatochalasis bilateral Upper Eyelid(s)  Postoperative Diagnosis:  Same.  Procedure(s) Performed:   1. Blepharoptosis repair with levator aponeurosis advancement bilateral Upper Eyelid(s) 2. Upper eyelid blepharoplasty with excess skin excision  bilateral Upper Eyelid(s)  Surgeon: Philis Pique. [name], M.D.  Assistants: none  Anesthesia: MAC  Specimens: None.  Estimated Blood Loss: Minimal.  Complications: None.  Operative Findings: None Dictated  Procedure:   Allergies were reviewed and the patient Statins.   After the risks, benefits, complications and alternatives were discussed with the patient, appropriate informed consent was obtained.  While seated in an upright position and looking in primary gaze, the mid pupillary line was marked on the upper eyelid margins bilaterally. The patient was then brought to the operating suite and reclined supine.  Timeout was conducted and the patient was sedated.  Local anesthetic consisting of a 50-50 mixture of 2% lidocaine with epinephrine and 0.75% bupivacaine with added Hylenex was injected subcutaneously to both upper eyelid(s). After adequate local was instilled, the patient was prepped and draped in the usual sterile fashion for eyelid surgery.   Attention was turned to the upper eyelids. A 28m upper eyelid crease incision line was marked with calipers on both upper eyelid(s).  A pinch test was used to estimate the amount of excess skin to remove and this was marked in standard blepharoplasty style fashion. Attention was turned to the  right upper eyelid. A #15 blade was used to open the premarked incision line. A skin only flap was excised and hemostasis was obtained with bipolar cautery.   A buttonhole was created medially in orbicularis and orbital septum to reveal the medial fat pocket. This was dissected free from fascial  attachments, cauterized towards the pedicle base and excised to produce a nice flattening of the medial corner of the upper eyelid.   Westcott scissors were then used to transect through orbicularis down to the tarsal plate. Epitarsus was dissected to create a smooth surface to suture to. Dissection was then carried superiorly in the plane between orbicularis and orbital septum. Once the preaponeurotic fat pocket was identified, the orbital septum was opened. This revealed the levator and its aponeurosis.    Attention was then turned to the opposite eyelid where the same procedure was performed in the same manner. Hemostasis was obtained with bipolar cautery throughout.   3 interrupted 6-0 Prolene sutures were then passed partial thickness through the tarsal plates of both upper eyelid(s). These sutures were placed in line with the mid pupillary, medial limbal, and lateral limbal lines. The sutures were fixed to the levator aponeurosis and adjusted until a nice lid height and contour were achieved. Once nice symmetry was achieved, the skin incisions were closed with a running 6-0 fast absorbing plain suture. The patient tolerated the procedure well.  Erythromycin ophthalmic ointment was applied to the incision site(s) followed by ice packs. The patient was taken to the recovery area where she recovered without difficulty.  Post-Op Plan/Instructions:  The patient was instructed to use ice packs frequently for the next 48 hours. She was instructed to use erythromycin ophthalmic ointment on her incisions 4 times a day for the next 12 to 14 days. Shewas given a prescription for Percocet for pain control should Tylenol not be effective. [facility] [place] [facility] or sooner as needed for problems.  [name] M. FVickki Cohen  M.D. [name]

## 2017-06-17 NOTE — Anesthesia Preprocedure Evaluation (Signed)
Anesthesia Evaluation  Patient identified by MRN, date of birth, ID band Patient awake    Reviewed: Allergy & Precautions, H&P , NPO status , Patient's Chart, lab work & pertinent test results  Airway Mallampati: II  TM Distance: >3 FB Neck ROM: full    Dental no notable dental hx.    Pulmonary    Pulmonary exam normal breath sounds clear to auscultation       Cardiovascular Normal cardiovascular exam Rhythm:regular Rate:Normal     Neuro/Psych  Headaches, PSYCHIATRIC DISORDERS    GI/Hepatic GERD  ,  Endo/Other    Renal/GU      Musculoskeletal   Abdominal   Peds  Hematology   Anesthesia Other Findings   Reproductive/Obstetrics                             Anesthesia Physical Anesthesia Plan  ASA: II  Anesthesia Plan: MAC   Post-op Pain Management:    Induction:   PONV Risk Score and Plan: 2 and Ondansetron and Midazolam  Airway Management Planned:   Additional Equipment:   Intra-op Plan:   Post-operative Plan:   Informed Consent: I have reviewed the patients History and Physical, chart, labs and discussed the procedure including the risks, benefits and alternatives for the proposed anesthesia with the patient or authorized representative who has indicated his/her understanding and acceptance.     Plan Discussed with: CRNA  Anesthesia Plan Comments:         Anesthesia Quick Evaluation

## 2017-06-17 NOTE — Interval H&P Note (Signed)
History and Physical Interval Note:  06/17/2017 10:12 AM  [name]  has presented today for surgery, with the diagnosis of H02.831 H02.834 DERMATOCHALASIS H02.403 PTOSIS OF EYELID UNSPECIFIED  The various methods of treatment have been discussed with the patient and family. After consideration of risks, benefits and other options for treatment, the patient has consented to  Procedure(s): BLEPHAROPLASTY UPPER EYELID WITH EXCESS SKIN (Bilateral) PTOSIS REPAIR RESECT EX (Bilateral) as a surgical intervention .  The patient's history has been reviewed, patient examined, no change in status, stable for surgery.  I have reviewed the patient's chart and labs.  Questions were answered to the patient's satisfaction.     Vickki Muff, Laiden Milles M

## 2017-06-17 NOTE — Transfer of Care (Signed)
Immediate Anesthesia Transfer of Care Note  Patient: [name]  Procedure(s) Performed: BLEPHAROPLASTY UPPER EYELID WITH EXCESS SKIN (Bilateral ) PTOSIS REPAIR RESECT EX (Bilateral )  Patient Location: PACU  Anesthesia Type: MAC  Level of Consciousness: awake, alert  and patient cooperative  Airway and Oxygen Therapy: Patient Spontanous Breathing and Patient connected to supplemental oxygen  Post-op Assessment: Post-op Vital signs reviewed, Patient's Cardiovascular Status Stable, Respiratory Function Stable, Patent Airway and No signs of Nausea or vomiting  Post-op Vital Signs: Reviewed and stable  Complications: No apparent anesthesia complications

## 2017-06-17 NOTE — Anesthesia Procedure Notes (Signed)
Performed by: Juana Montini Pre-anesthesia Checklist: Patient identified, Emergency Drugs available, Suction available, Timeout performed and Patient being monitored Patient Re-evaluated:Patient Re-evaluated prior to induction Oxygen Delivery Method: Nasal cannula Placement Confirmation: positive ETCO2       

## 2017-06-17 NOTE — H&P (Signed)
See the history and physical completed at [facility] on 05/28/17 and scanned into the chart.

## 2017-06-18 ENCOUNTER — Encounter: Payer: Self-pay | Admitting: Ophthalmology

## 2017-06-23 ENCOUNTER — Other Ambulatory Visit: Payer: Self-pay | Admitting: Family Medicine

## 2017-06-23 DIAGNOSIS — Z1231 Encounter for screening mammogram for malignant neoplasm of breast: Secondary | ICD-10-CM

## 2017-06-24 ENCOUNTER — Other Ambulatory Visit: Payer: Self-pay | Admitting: Family Medicine

## 2017-06-25 NOTE — Telephone Encounter (Signed)
On 9.19.18 #90+3 was faxed/thx dmf

## 2017-07-01 ENCOUNTER — Other Ambulatory Visit: Payer: Self-pay | Admitting: Family Medicine

## 2017-07-02 NOTE — Telephone Encounter (Signed)
On 9.19.18 #90+2 was faxed/thx dmf

## 2017-07-14 ENCOUNTER — Ambulatory Visit
Admission: RE | Admit: 2017-07-14 | Discharge: 2017-07-14 | Disposition: A | Discharge status: Home / Self Care | Payer: BLUE CROSS/BLUE SHIELD | Source: Ambulatory Visit | Attending: Family Medicine | Admitting: Family Medicine

## 2017-07-14 DIAGNOSIS — Z1231 Encounter for screening mammogram for malignant neoplasm of breast: Secondary | ICD-10-CM

## 2017-09-06 ENCOUNTER — Other Ambulatory Visit: Payer: Self-pay | Admitting: Family Medicine

## 2017-09-08 ENCOUNTER — Other Ambulatory Visit: Payer: Self-pay

## 2017-09-08 MED ORDER — ESCITALOPRAM OXALATE 20 MG PO TABS: 20.0000 mg | ORAL_TABLET | Freq: Every day | ORAL | 1 refills | Status: DC

## 2017-09-08 NOTE — Progress Notes (Signed)
Ins req 90d Rx Escitalopram/faxed/thx dmf

## 2017-11-05 ENCOUNTER — Other Ambulatory Visit: Payer: Self-pay

## 2017-11-05 ENCOUNTER — Ambulatory Visit (INDEPENDENT_AMBULATORY_CARE_PROVIDER_SITE_OTHER)
Admission: RE | Admit: 2017-11-05 | Discharge: 2017-11-05 | Disposition: A | Discharge status: Home / Self Care | Payer: BLUE CROSS/BLUE SHIELD | Source: Ambulatory Visit | Attending: Family Medicine | Admitting: Family Medicine

## 2017-11-05 ENCOUNTER — Ambulatory Visit: Payer: BLUE CROSS/BLUE SHIELD | Admitting: Family Medicine

## 2017-11-05 ENCOUNTER — Encounter: Payer: Self-pay | Admitting: Family Medicine

## 2017-11-05 VITALS — BP 140/90 | HR 75 | Temp 97.7°F | Ht 64.5 in | Wt 184.3 lb

## 2017-11-05 DIAGNOSIS — M7581 Other shoulder lesions, right shoulder: Secondary | ICD-10-CM

## 2017-11-05 DIAGNOSIS — M25532 Pain in left wrist: Secondary | ICD-10-CM

## 2017-11-05 DIAGNOSIS — M7551 Bursitis of right shoulder: Secondary | ICD-10-CM

## 2017-11-05 NOTE — Patient Instructions (Signed)

## 2017-11-05 NOTE — Progress Notes (Signed)
Dr. Frederico Hamman T. Makenzie Vittorio, MD, [facility] [place], [address] Phone: [phone] Fax: [phone]  11/05/2017  Patient: [name], MRN: [id number], DOB: 22-Aug-1956, 62 y.o.  Primary Physician:  Lucille Passy, MD   Chief Complaint  Patient presents with  . Wrist Pain    Left-tripped and fell 2 to 3 weeks ago  . Shoulder Pain    Right   Subjective:   [name] is a 62 y.o. very pleasant female patient who presents with the following:  Several weeks ago, wrist has been bothering her. Still hurts.  Has gotten better somewhat over the last couple weeks, but she still is having pain pain when she loads her wrist, primarily in the carpal region alone.  She has been compression wrap.  Wearing initially a more rigid splint, now she is wearing more of a compression wrap.  R RTC tendinopathy.  Been having some pain in the past couple months, pain with abduction as well as some terminal internal range of motion.  She does not have any specific injury that she can recall, she is not having any change in her motion.  Joint is been stable and is not subluxed or dislocated at all.  She had no significant history of trauma in this region.  She is very active and does a lot of repetitive motions at work and does a lot of repetitive cleaning motions.  Past Medical History, Surgical History, Social History, Family History, Problem List, Medications, and Allergies have been reviewed and updated if relevant.  Patient Active Problem List   Diagnosis Date Noted  . Varicose veins of bilateral lower extremities with pain 06/09/2017  . Mood swings 02/20/2016  . Menopausal symptoms 02/16/2015  . Vitamin B 12 deficiency 04/11/2014  . Stress incontinence 08/31/2013  . Cervico-occipital neuralgia 06/01/2013  . IBS (irritable bowel syndrome) 03/30/2013  . HLD (hyperlipidemia) 03/09/2012  . Migraine 12/31/2011  . Anxiety 12/31/2011  .  Depression 12/31/2011  . Insomnia 12/31/2011  . Breast cancer ([place]) 12/31/2011    Past Medical History:  Diagnosis Date  . Anemia   . Anxiety   . Blood transfusion without reported diagnosis   . Breast cancer ([place]) 02/27/2007  . GERD (gastroesophageal reflux disease)   . Hypercholesterolemia   . Migraine   . Personal history of radiation therapy 2008   Right Breast Cancer  . Wears dentures    partial upper    Past Surgical History:  Procedure Laterality Date  . ABDOMINAL HYSTERECTOMY    . BREAST BIOPSY Right 02/27/2007  . BREAST LUMPECTOMY Right 04/02/2007  . BREAST SURGERY  01/14/2013   breast reduction left side  . BROW LIFT Bilateral 06/17/2017   Procedure: BLEPHAROPLASTY UPPER EYELID WITH EXCESS SKIN;  Surgeon: Karle Starch, MD;  Location: [facility];  Service: Ophthalmology;  Laterality: Bilateral;  . CESAREAN SECTION     two times  . COLONOSCOPY    . FOOT SURGERY Right   . PTOSIS REPAIR Bilateral 06/17/2017   Procedure: PTOSIS REPAIR RESECT EX;  Surgeon: Karle Starch, MD;  Location: [facility];  Service: Ophthalmology;  Laterality: Bilateral;  . REDUCTION MAMMAPLASTY Left     Social History   Socioeconomic History  . Marital status: Married    Spouse name: Not on file  . Number of children: Not on file  . Years of education: Not on file  . Highest education level: Not on  file  Social Needs  . Financial resource strain: Not on file  . Food insecurity - worry: Not on file  . Food insecurity - inability: Not on file  . Transportation needs - medical: Not on file  . Transportation needs - non-medical: Not on file  Occupational History  . Not on file  Tobacco Use  . Smoking status: Never Smoker  . Smokeless tobacco: Never Used  Substance and Sexual Activity  . Alcohol use: No    Alcohol/week: 0.0 oz  . Drug use: No  . Sexual activity: Yes    Partners: Male    Birth control/protection: Surgical  Other Topics Concern  . Not on file    Social History Narrative  . Not on file    Family History  Problem Relation Age of Onset  . Thyroid disease Mother   . Hypertension Mother   . Hyperlipidemia Mother   . Alzheimer's disease Mother   . Heart failure Mother   . Esophageal cancer Father   . Hyperlipidemia Father   . Heart murmur Father   . Colon polyps Father   . Diabetes Sister   . Alzheimer's disease Maternal Aunt   . Alzheimer's disease Maternal Uncle   . Diabetes Paternal Uncle   . Alzheimer's disease Maternal Grandmother   . Diabetes Paternal Grandmother   . Breast cancer Sister   . Irritable bowel syndrome Sister   . Ulcerative colitis Sister        ????  . Breast cancer Cousin   . Breast cancer Cousin   . Colon cancer Neg Hx     Allergies  Allergen Reactions  . Statins Other (See Comments)    Muscle aches    Medication list reviewed and updated in full in [facility].  GEN: No fevers, chills. Nontoxic. Primarily MSK c/o today. MSK: Detailed in the HPI GI: tolerating PO intake without difficulty Neuro: No numbness, parasthesias, or tingling associated. Otherwise the pertinent positives of the ROS are noted above.   Objective:   BP 140/90   Pulse 75   Temp 97.7 F (36.5 C) (Oral)   Ht 5' 4.5" (1.638 m)   Wt 184 lb 5 oz (83.6 kg)   BMI 31.15 kg/m    GEN: WDWN, NAD, Non-toxic, Alert & Oriented x 3 HEENT: Atraumatic, Normocephalic.  Ears and Nose: No external deformity. EXTR: No clubbing/cyanosis/edema NEURO: Normal gait.  PSYCH: Normally interactive. Conversant. Not depressed or anxious appearing.  Calm demeanor.   Hand: L Ecchymosis or edema: neg ROM wrist/hand/digits/elbow: full, mild limitation at ext Carpals, MCP's, digits: NT Distal Ulna and Radius: NT Supination lift test: neg Ecchymosis or edema: neg Cysts/nodules: neg Finkelstein's test: neg Snuffbox tenderness: neg Scaphoid tubercle: NT Hook of Hamate: NT Resisted supination: NT Full composite fist Grip, all  digits: 5/5 str No tenosynovitis Axial load test: modest pain Phalen's: neg Tinel's: neg Atrophy: neg  Hand sensation: intact   Shoulder: R Inspection: No muscle wasting or winging Ecchymosis/edema: neg  AC joint, scapula, clavicle: NT Cervical spine: NT, full ROM Spurling's: neg Abduction: full, 5/5 Flexion: full, 5/5 IR, full, lift-off: 5/5 ER at neutral: full, 5/5 AC crossover: neg Neer: pos Hawkins: pos Drop Test: neg Empty Can: pos Supraspinatus insertion: mild-mod T Bicipital groove: NT Speed's: neg Yergason's: neg Sulcus sign: neg Scapular dyskinesis: none C5-T1 intact  Neuro: Sensation intact Grip 5/5   Radiology: Dg Wrist Complete Left  Result Date: 11/05/2017 CLINICAL DATA:  Left wrist pain.  The patient fell a  few weeks ago. EXAM: LEFT WRIST - COMPLETE 3+ VIEW COMPARISON:  None. FINDINGS: There is no evidence of fracture or dislocation. There is no evidence of arthropathy or other focal bone abnormality. Soft tissues are unremarkable. IMPRESSION: Negative. Electronically Signed   By: Lorriane Shire M.D.   On: 11/05/2017 13:25     Assessment and Plan:   Acute pain of left wrist - Plan: DG Wrist Complete Left  Rotator cuff tendonitis, right - Plan: Ambulatory referral to Physical Therapy  Subacromial bursitis of right shoulder joint - Plan: Ambulatory referral to Physical Therapy  She wants to be conservative, I think that is entirely reasonable.  I am not entirely worried about that wrist.  She likely has some sprain at the carpal ligaments, versus DRUJ.  I think compression wrap will be fine when she is active and using it.  Fairly classic rotator cuff tendinopathy and subacromial bursitis.  She is already getting a little bit better over the last week or so.  Keep things below her nose.  I reviewed some home rehab.  And a few visits of physical therapy I think would also be quite helpful.  She is not made significant progress in 6 weeks or so, asked  her to follow-up.  Follow-up: No Follow-up on file.  Orders Placed This Encounter  Procedures  . DG Wrist Complete Left  . Ambulatory referral to Physical Therapy    Signed,  Frederico Hamman T. Adaley Kiene, MD   Allergies as of 11/05/2017      Reactions   Statins Other (See Comments)   Muscle aches      Medication List        Accurate as of 11/05/17 11:59 PM. Always use your most recent med list.          escitalopram 20 MG tablet Commonly known as:  LEXAPRO Take 1 tablet (20 mg total) by mouth daily.   fluticasone 50 MCG/ACT nasal spray Commonly known as:  FLONASE PLACE 2 SPRAYS INTO BOTH NOSTRILS DAILY.   ibuprofen 800 MG tablet Commonly known as:  ADVIL,MOTRIN Take 1 tablet (800 mg total) by mouth every 8 (eight) hours as needed.   multivitamin tablet Take 1 tablet by mouth daily.   omeprazole 20 MG capsule Commonly known as:  PRILOSEC TAKE 1 CAPSULE (20 MG TOTAL) BY MOUTH DAILY.   pravastatin 20 MG tablet Commonly known as:  PRAVACHOL TAKE 1 TABLET BY MOUTH EVERY DAY   promethazine 25 MG tablet Commonly known as:  PHENERGAN Take 1 tablet (25 mg total) by mouth every 6 (six) hours as needed for nausea or vomiting.   VITAMIN B-12 PO Take by mouth daily.   zolmitriptan 5 MG tablet Commonly known as:  ZOMIG Take 1 tablet (5 mg total) by mouth as needed for migraine.

## 2017-11-06 ENCOUNTER — Encounter: Payer: Self-pay | Admitting: Family Medicine

## 2017-11-07 ENCOUNTER — Other Ambulatory Visit: Payer: Self-pay | Admitting: Family Medicine

## 2017-11-07 MED ORDER — TRAMADOL HCL 50 MG PO TABS: 50.0000 mg | ORAL_TABLET | Freq: Four times a day (QID) | ORAL | 1 refills | Status: AC | PRN

## 2017-11-07 MED ORDER — DICLOFENAC SODIUM 75 MG PO TBEC: 75.0000 mg | DELAYED_RELEASE_TABLET | Freq: Two times a day (BID) | ORAL | 1 refills | Status: DC

## 2018-01-04 ENCOUNTER — Other Ambulatory Visit: Payer: Self-pay | Admitting: Family Medicine

## 2018-01-31 ENCOUNTER — Other Ambulatory Visit: Payer: Self-pay | Admitting: Family Medicine

## 2018-01-31 DIAGNOSIS — G43009 Migraine without aura, not intractable, without status migrainosus: Secondary | ICD-10-CM

## 2018-02-03 ENCOUNTER — Encounter: Payer: Self-pay | Admitting: Gastroenterology

## 2018-03-04 ENCOUNTER — Encounter: Payer: Self-pay | Admitting: *Deleted

## 2018-03-06 ENCOUNTER — Ambulatory Visit: Payer: BLUE CROSS/BLUE SHIELD | Admitting: Family Medicine

## 2018-03-06 ENCOUNTER — Ambulatory Visit (INDEPENDENT_AMBULATORY_CARE_PROVIDER_SITE_OTHER)
Admission: RE | Admit: 2018-03-06 | Discharge: 2018-03-06 | Disposition: A | Discharge status: Home / Self Care | Payer: BLUE CROSS/BLUE SHIELD | Source: Ambulatory Visit | Attending: Family Medicine | Admitting: Family Medicine

## 2018-03-06 ENCOUNTER — Encounter: Payer: Self-pay | Admitting: Family Medicine

## 2018-03-06 VITALS — BP 150/86 | HR 83 | Temp 98.7°F | Ht 64.5 in | Wt 165.5 lb

## 2018-03-06 DIAGNOSIS — M7581 Other shoulder lesions, right shoulder: Secondary | ICD-10-CM

## 2018-03-06 DIAGNOSIS — M7551 Bursitis of right shoulder: Secondary | ICD-10-CM | POA: Diagnosis not present

## 2018-03-06 NOTE — Progress Notes (Signed)
Dr. Frederico Hamman T. Lyndsee Casa, MD, [facility] and Sports Medicine [place], [address] Phone: [phone] Fax: [phone]  03/06/2018  Patient: [name], MRN: [id number], DOB: 1956/03/07, 62 y.o.  Primary Physician:  Lucille Passy, MD   Chief Complaint  Patient presents with  . Shoulder Pain    Right   Subjective:   [name] is a 62 y.o. 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 Hurting and clicking more now.   She is a very pleasant lady who I remember well.  She is right-hand dominant, and she recently did a lot of work with her right shoulder, and now she has some pain with abduction as well as internal range of motion.  She has some occasional clicking and popping.  She has no history of traumatic injury or surgery to the affected shoulder.  Past Medical History, Surgical History, Social History, Family History, Problem List, Medications, and Allergies have been reviewed and updated if relevant.  Patient Active Problem List   Diagnosis Date Noted  . Varicose veins of bilateral lower extremities with pain 06/09/2017  . Mood swings 02/20/2016  . Menopausal symptoms 02/16/2015  . Vitamin B 12 deficiency 04/11/2014  . Stress incontinence 08/31/2013  . Cervico-occipital neuralgia 06/01/2013  . HLD (hyperlipidemia) 03/09/2012  . Migraine 12/31/2011  . Anxiety 12/31/2011  . Depression 12/31/2011  . Breast cancer ([place]) 12/31/2011    Past Medical History:  Diagnosis Date  . Anemia   . Anxiety   . Blood transfusion without reported diagnosis   . Breast cancer ([place]) 02/27/2007  . GERD (gastroesophageal reflux disease)   . Hypercholesterolemia   . Migraine   . Personal history of radiation therapy 2008   Right Breast Cancer  . Wears dentures    partial upper    Past Surgical History:  Procedure Laterality Date    . ABDOMINAL HYSTERECTOMY    . BREAST BIOPSY Right 02/27/2007  . BREAST LUMPECTOMY Right 04/02/2007  . BREAST SURGERY  01/14/2013   breast reduction left side  . BROW LIFT Bilateral 06/17/2017   Procedure: BLEPHAROPLASTY UPPER EYELID WITH EXCESS SKIN;  Surgeon: Karle Starch, MD;  Location: [facility];  Service: Ophthalmology;  Laterality: Bilateral;  . CESAREAN SECTION     two times  . COLONOSCOPY    . FOOT SURGERY Right   . PTOSIS REPAIR Bilateral 06/17/2017   Procedure: PTOSIS REPAIR RESECT EX;  Surgeon: Karle Starch, MD;  Location: [facility];  Service: Ophthalmology;  Laterality: Bilateral;  . REDUCTION MAMMAPLASTY Left     Social History   Socioeconomic History  . Marital status: Married    Spouse name: Not on file  . Number of children: Not on file  . Years of education: Not on file  . Highest education level: Not on file  Occupational History  . Not on file  Social Needs  . Financial resource strain: Not on file  . Food insecurity:    Worry: Not on file    Inability: Not on file  . Transportation needs:    Medical: Not on file    Non-medical: Not on file  Tobacco Use  . Smoking status: Never Smoker  . Smokeless tobacco: Never Used  Substance and Sexual Activity  . Alcohol use: No    Alcohol/week: 0.0 oz  . Drug use: No  .  Sexual activity: Yes    Partners: Male    Birth control/protection: Surgical  Lifestyle  . Physical activity:    Days per week: Not on file    Minutes per session: Not on file  . Stress: Not on file  Relationships  . Social connections:    Talks on phone: Not on file    Gets together: Not on file    Attends religious service: Not on file    Active member of club or organization: Not on file    Attends meetings of clubs or organizations: Not on file    Relationship status: Not on file  . Intimate partner violence:    Fear of current or ex partner: Not on file    Emotionally abused: Not on file    Physically abused:  Not on file    Forced sexual activity: Not on file  Other Topics Concern  . Not on file  Social History Narrative  . Not on file    Family History  Problem Relation Age of Onset  . Thyroid disease Mother   . Hypertension Mother   . Hyperlipidemia Mother   . Alzheimer's disease Mother   . Heart failure Mother   . Esophageal cancer Father   . Hyperlipidemia Father   . Heart murmur Father   . Colon polyps Father   . Diabetes Sister   . Alzheimer's disease Maternal Aunt   . Alzheimer's disease Maternal Uncle   . Diabetes Paternal Uncle   . Alzheimer's disease Maternal Grandmother   . Diabetes Paternal Grandmother   . Breast cancer Sister   . Irritable bowel syndrome Sister   . Ulcerative colitis Sister        ????  . Breast cancer Cousin   . Breast cancer Cousin   . Colon cancer Neg Hx     Allergies  Allergen Reactions  . Statins Other (See Comments)    Muscle aches    Medication list reviewed and updated in full in [facility].  GEN: No fevers, chills. Nontoxic. Primarily MSK c/o today. MSK: Detailed in the HPI GI: tolerating PO intake without difficulty Neuro: No numbness, parasthesias, or tingling associated. Otherwise the pertinent positives of the ROS are noted above.   Objective:   BP (!) 150/86   Pulse 83   Temp 98.7 F (37.1 C) (Oral)   Ht 5' 4.5" (1.638 m)   Wt 165 lb 8 oz (75.1 kg)   BMI 27.97 kg/m    GEN: Well-developed,well-nourished,in no acute distress; alert,appropriate and cooperative throughout examination HEENT: Normocephalic and atraumatic without obvious abnormalities. Ears, externally no deformities PULM: Breathing comfortably in no respiratory distress EXT: No clubbing, cyanosis, or edema PSYCH: Normally interactive. Cooperative during the interview. Pleasant. Friendly and conversant. Not anxious or depressed appearing. Normal, full affect.  Shoulder: R Inspection: No muscle wasting or winging Ecchymosis/edema: neg  AC joint,  scapula, clavicle: NT Cervical spine: NT, full ROM Spurling's: neg Abduction: full, 5/5 Flexion: full, 5/5 IR, full, lift-off: 5/5 ER at neutral: full, 5/5 AC crossover: neg Neer: pos Hawkins: pos Drop Test: neg Empty Can: pos Supraspinatus insertion: mild-mod T Bicipital groove: NT Speed's: neg Yergason's: neg Sulcus sign: neg Scapular dyskinesis: none C5-T1 intact  Neuro: Sensation intact Grip 5/5   Radiology: Dg Shoulder Right  Result Date: 03/06/2018 CLINICAL DATA:  Pain EXAM: RIGHT SHOULDER - 2+ VIEW COMPARISON:  None. FINDINGS: Oblique, Y scapular, and axillary images were obtained. No fracture or dislocation evident. There is osteoarthritic change  in the acromioclavicular joint with mild bony overgrowth in this area. The glenohumeral joint appears unremarkable. No erosive change or intra-articular calcification. Visualized right lung clear. IMPRESSION: Osteoarthritic change in the acromioclavicular joint with mild bony overgrowth in this area. No fracture or dislocation. The glenohumeral joint appears within normal limits. Electronically Signed   By: Lowella Grip III M.D.   On: 03/06/2018 08:40     Assessment and Plan:   Rotator cuff tendonitis, right - Plan: DG Shoulder Right  Subacromial bursitis of right shoulder joint - Plan: DG Shoulder Right  >25 minutes spent in face to face time with patient, >50% spent in counselling or coordination of care   She has classic rotator cuff tendinopathy and subacromial bursitis.  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  Shoulder feels a little bit better today.  Have her relatively rest her shoulder keep her arms below her nose level, numb and give her a throwers 10 type rehab program.  If she has persistent symptoms more than a month, I asked her to follow-up with me. I would anticipate her to do well.  Orders Placed This Encounter  Procedures    . DG Shoulder Right    Signed,  Frederico Hamman T. Tobenna Needs, MD   Allergies as of 03/06/2018      Reactions   Statins Other (See Comments)   Muscle aches      Medication List        Accurate as of 03/06/18 11:59 PM. Always use your most recent med list.          escitalopram 20 MG tablet Commonly known as:  LEXAPRO TAKE 1 TABLET BY MOUTH EVERY DAY   fluticasone 50 MCG/ACT nasal spray Commonly known as:  FLONASE PLACE 2 SPRAYS INTO BOTH NOSTRILS DAILY.   ibuprofen 800 MG tablet Commonly known as:  ADVIL,MOTRIN Take 1 tablet (800 mg total) by mouth every 8 (eight) hours as needed.   multivitamin tablet Take 1 tablet by mouth daily.   omeprazole 20 MG capsule Commonly known as:  PRILOSEC TAKE 1 CAPSULE (20 MG TOTAL) BY MOUTH DAILY.   pravastatin 20 MG tablet Commonly known as:  PRAVACHOL TAKE 1 TABLET BY MOUTH EVERY DAY   promethazine 25 MG tablet Commonly known as:  PHENERGAN Take 1 tablet (25 mg total) by mouth every 6 (six) hours as needed for nausea or vomiting.   traMADol 50 MG tablet Commonly known as:  ULTRAM TAKE 1 TABLET (50 MG TOTAL) BY MOUTH EVERY 6 (SIX) HOURS AS NEEDED FOR UP TO 10 DAYS.   VITAMIN B-12 PO Take by mouth daily.   zolmitriptan 5 MG tablet Commonly known as:  ZOMIG TAKE 1 TABLET BY MOUTH EVERY DAY AS NEEDED FOR MIGRAINE

## 2018-03-23 ENCOUNTER — Encounter: Payer: Self-pay | Admitting: Internal Medicine

## 2018-03-23 ENCOUNTER — Ambulatory Visit: Payer: BLUE CROSS/BLUE SHIELD | Admitting: Internal Medicine

## 2018-03-23 ENCOUNTER — Telehealth: Payer: Self-pay | Admitting: Internal Medicine

## 2018-03-23 VITALS — BP 144/84 | HR 81 | Temp 98.3°F | Wt 165.0 lb

## 2018-03-23 DIAGNOSIS — B9789 Other viral agents as the cause of diseases classified elsewhere: Secondary | ICD-10-CM | POA: Diagnosis not present

## 2018-03-23 DIAGNOSIS — J329 Chronic sinusitis, unspecified: Secondary | ICD-10-CM

## 2018-03-23 MED ORDER — METHYLPREDNISOLONE ACETATE 80 MG/ML IJ SUSP
80.0000 mg | Freq: Once | INTRAMUSCULAR | Status: AC
  Administered 2018-03-23: 80 mg via INTRAMUSCULAR

## 2018-03-23 NOTE — Patient Instructions (Signed)

## 2018-03-23 NOTE — Telephone Encounter (Signed)
Copied from [place] [id number]. Topic: Quick Communication - See Telephone Encounter >> Mar 23, 2018 12:40 PM Neva Seat wrote: Pt wanting a prescription for Zyrtec if possible.  It's cheaper with her insurance.

## 2018-03-23 NOTE — Progress Notes (Signed)
HPI  Pt presents to the clinic today with c/o headache, facial pain and pressure, ear fullness and nasal congestion. This started 5 days ago. She describes the headache as pressure. She is blowing clear, blood tinged mucous our of her nose. She denies ear pain or decreased hearing. She denies sore throat, cough or chest congestion. She denies fever, chills or body aches. She has tried [facility] and Sudafed with minimal relief.  Review of Systems     Past Medical History:  Diagnosis Date  . Anemia   . Anxiety   . Blood transfusion without reported diagnosis   . Breast cancer ([facility]) 02/27/2007  . GERD (gastroesophageal reflux disease)   . Hypercholesterolemia   . Migraine   . Personal history of radiation therapy 2008   Right Breast Cancer  . Wears dentures    partial upper    Family History  Problem Relation Age of Onset  . Thyroid disease Mother   . Hypertension Mother   . Hyperlipidemia Mother   . Alzheimer's disease Mother   . Heart failure Mother   . Esophageal cancer Father   . Hyperlipidemia Father   . Heart murmur Father   . Colon polyps Father   . Diabetes Sister   . Alzheimer's disease Maternal Aunt   . Alzheimer's disease Maternal Uncle   . Diabetes Paternal Uncle   . Alzheimer's disease Maternal Grandmother   . Diabetes Paternal Grandmother   . Breast cancer Sister   . Irritable bowel syndrome Sister   . Ulcerative colitis Sister        ????  . Breast cancer Cousin   . Breast cancer Cousin   . Colon cancer Neg Hx     Social History   Socioeconomic History  . Marital status: Married    Spouse name: Not on file  . Number of children: Not on file  . Years of education: Not on file  . Highest education level: Not on file  Occupational History  . Not on file  Social Needs  . Financial resource strain: Not on file  . Food insecurity:    Worry: Not on file    Inability: Not on file  . Transportation needs:    Medical: Not on file    Non-medical: Not on  file  Tobacco Use  . Smoking status: Never Smoker  . Smokeless tobacco: Never Used  Substance and Sexual Activity  . Alcohol use: No    Alcohol/week: 0.0 oz  . Drug use: No  . Sexual activity: Yes    Partners: Male    Birth control/protection: Surgical  Lifestyle  . Physical activity:    Days per week: Not on file    Minutes per session: Not on file  . Stress: Not on file  Relationships  . Social connections:    Talks on phone: Not on file    Gets together: Not on file    Attends religious service: Not on file    Active member of club or organization: Not on file    Attends meetings of clubs or organizations: Not on file    Relationship status: Not on file  . Intimate partner violence:    Fear of current or ex partner: Not on file    Emotionally abused: Not on file    Physically abused: Not on file    Forced sexual activity: Not on file  Other Topics Concern  . Not on file  Social History Narrative  . Not on file  Allergies  Allergen Reactions  . Statins Other (See Comments)    Muscle aches     Constitutional: Positive headache, fatigue and fever. Denies abrupt weight changes.  HEENT:  Positive eye pain, facial pain, nasal congestion and sore throat. Denies eye redness, ear pain, ringing in the ears, wax buildup, runny nose or bloody nose. Respiratory: Positive cough. Denies difficulty breathing or shortness of breath.  Cardiovascular: Denies chest pain, chest tightness, palpitations or swelling in the hands or feet.   No other specific complaints in a complete review of systems (except as listed in HPI above).  Objective:   BP (!) 144/84   Pulse 81   Temp 98.3 F (36.8 C) (Oral)   Wt 165 lb (74.8 kg)   SpO2 98%   BMI 27.88 kg/m   General: Appears her stated age, well developed, well nourished in NAD. HEENT: Head: normal shape and size, frontal sinus tenderness noted; Ears: Tm's gray and intact, normal light reflex; Nose: mucosa pinkand moist, septum  midline; Throat/Mouth: + PND. Teeth present, mucosa pinkand moist, no exudate noted, no lesions or ulcerations noted.  Neck:  No adenopathy noted.  Cardiovascular: Normal rate and rhythm. S1,S2 noted.  No murmur, rubs or gallops noted.  Pulmonary/Chest: Normal effort and positive vesicular breath sounds. No respiratory distress. No wheezes, rales or ronchi noted.       Assessment & Plan:   Viral Sinusitis:  Can use a Neti Pot which can be purchased from your local drug store. Flonase 2 sprays each nostril for 3 days and then as needed. 80 mg Depo IM today Stop Sudafed due to elevated blood pressure  RTC as needed or if symptoms persist. Webb Silversmith, NP

## 2018-03-23 NOTE — Addendum Note (Signed)
Addended by: Lurlean Nanny on: 03/23/2018 12:35 PM   Modules accepted: Orders

## 2018-03-24 MED ORDER — CETIRIZINE HCL 10 MG PO TABS: 10.0000 mg | ORAL_TABLET | Freq: Every day | ORAL | 11 refills | Status: DC

## 2018-03-24 NOTE — Telephone Encounter (Signed)
Rx sent in/thx dmf

## 2018-03-30 ENCOUNTER — Ambulatory Visit: Payer: BLUE CROSS/BLUE SHIELD | Admitting: Family Medicine

## 2018-04-25 ENCOUNTER — Encounter: Payer: Self-pay | Admitting: Family Medicine

## 2018-04-28 ENCOUNTER — Encounter: Payer: Self-pay | Admitting: *Deleted

## 2018-04-29 ENCOUNTER — Ambulatory Visit (INDEPENDENT_AMBULATORY_CARE_PROVIDER_SITE_OTHER)
Admission: RE | Admit: 2018-04-29 | Discharge: 2018-04-29 | Disposition: A | Discharge status: Home / Self Care | Payer: BLUE CROSS/BLUE SHIELD | Source: Ambulatory Visit | Attending: Family Medicine | Admitting: Family Medicine

## 2018-04-29 ENCOUNTER — Encounter: Payer: Self-pay | Admitting: Family Medicine

## 2018-04-29 ENCOUNTER — Encounter

## 2018-04-29 ENCOUNTER — Ambulatory Visit: Payer: BLUE CROSS/BLUE SHIELD | Admitting: Family Medicine

## 2018-04-29 VITALS — BP 140/90 | HR 80 | Temp 98.4°F | Ht 64.5 in | Wt 165.5 lb

## 2018-04-29 DIAGNOSIS — M79644 Pain in right finger(s): Secondary | ICD-10-CM

## 2018-04-29 DIAGNOSIS — M7581 Other shoulder lesions, right shoulder: Secondary | ICD-10-CM

## 2018-04-29 MED ORDER — TRAMADOL HCL 50 MG PO TABS: ORAL_TABLET | ORAL | 1 refills | Status: DC

## 2018-04-29 MED ORDER — IBUPROFEN 800 MG PO TABS: 800.0000 mg | ORAL_TABLET | Freq: Three times a day (TID) | ORAL | 3 refills | Status: DC | PRN

## 2018-04-29 NOTE — Progress Notes (Signed)
Dr. Frederico Hamman T. Kalene Cutler, MD, [facility] and Sports Medicine [place], [address] Phone: [phone] Fax: [phone]  04/29/2018  Patient: [name], MRN: [id number], DOB: May 20, 1956, 62 y.o.  Primary Physician:  Lucille Passy, MD   Chief Complaint  Patient presents with  . Finger Injury    Right Thumb   Subjective:   [name] is a 62 y.o. very pleasant female patient who presents with the following:  R thumb pain  Some swelling and bruising The patient was on a trip, she sustained an extension injury at the right thumb.  Since then she has had pain over the last week along with bruising and swelling, and this is more on the volar aspect of the thumb, and this is where her greatest pain is.  She also additionally has been having some problems with rotator cuff tendinitis and pain with abduction, and this continues.  Also with ongoing RTC tendinopathy and subac bursitis on the r side.   Sesamoid fx on thumb  Past Medical History, Surgical History, Social History, Family History, Problem List, Medications, and Allergies have been reviewed and updated if relevant.  Patient Active Problem List   Diagnosis Date Noted  . Varicose veins of bilateral lower extremities with pain 06/09/2017  . Menopausal symptoms 02/16/2015  . Vitamin B 12 deficiency 04/11/2014  . Stress incontinence 08/31/2013  . Cervico-occipital neuralgia 06/01/2013  . HLD (hyperlipidemia) 03/09/2012  . Migraine 12/31/2011  . Anxiety 12/31/2011  . Depression 12/31/2011  . Breast cancer ([facility]) 12/31/2011    Past Medical History:  Diagnosis Date  . Anemia   . Anxiety   . Blood transfusion without reported diagnosis   . Breast cancer ([facility]) 02/27/2007  . GERD (gastroesophageal reflux disease)   . Hypercholesterolemia   . Migraine   . Personal history of radiation therapy 2008   Right Breast Cancer  . Wears dentures    partial upper    Past Surgical  History:  Procedure Laterality Date  . ABDOMINAL HYSTERECTOMY    . BREAST BIOPSY Right 02/27/2007  . BREAST LUMPECTOMY Right 04/02/2007  . BREAST SURGERY  01/14/2013   breast reduction left side  . BROW LIFT Bilateral 06/17/2017   Procedure: BLEPHAROPLASTY UPPER EYELID WITH EXCESS SKIN;  Surgeon: Karle Starch, MD;  Location: [facility];  Service: Ophthalmology;  Laterality: Bilateral;  . CESAREAN SECTION     two times  . COLONOSCOPY    . FOOT SURGERY Right   . PTOSIS REPAIR Bilateral 06/17/2017   Procedure: PTOSIS REPAIR RESECT EX;  Surgeon: Karle Starch, MD;  Location: [facility];  Service: Ophthalmology;  Laterality: Bilateral;  . REDUCTION MAMMAPLASTY Left     Social History   Socioeconomic History  . Marital status: Married    Spouse name: Not on file  . Number of children: Not on file  . Years of education: Not on file  . Highest education level: Not on file  Occupational History  . Not on file  Social Needs  . Financial resource strain: Not on file  . Food insecurity:    Worry: Not on file    Inability: Not on file  . Transportation needs:    Medical: Not on file    Non-medical: Not on file  Tobacco Use  . Smoking status: Never Smoker  . Smokeless tobacco: Never Used  Substance and Sexual Activity  . Alcohol use: No    Alcohol/week: 0.0  standard drinks  . Drug use: No  . Sexual activity: Yes    Partners: Male    Birth control/protection: Surgical  Lifestyle  . Physical activity:    Days per week: Not on file    Minutes per session: Not on file  . Stress: Not on file  Relationships  . Social connections:    Talks on phone: Not on file    Gets together: Not on file    Attends religious service: Not on file    Active member of club or organization: Not on file    Attends meetings of clubs or organizations: Not on file    Relationship status: Not on file  . Intimate partner violence:    Fear of current or ex partner: Not on file     Emotionally abused: Not on file    Physically abused: Not on file    Forced sexual activity: Not on file  Other Topics Concern  . Not on file  Social History Narrative  . Not on file    Family History  Problem Relation Age of Onset  . Thyroid disease Mother   . Hypertension Mother   . Hyperlipidemia Mother   . Alzheimer's disease Mother   . Heart failure Mother   . Esophageal cancer Father   . Hyperlipidemia Father   . Heart murmur Father   . Colon polyps Father   . Diabetes Sister   . Alzheimer's disease Maternal Aunt   . Alzheimer's disease Maternal Uncle   . Diabetes Paternal Uncle   . Alzheimer's disease Maternal Grandmother   . Diabetes Paternal Grandmother   . Breast cancer Sister   . Irritable bowel syndrome Sister   . Ulcerative colitis Sister        ????  . Breast cancer Cousin   . Breast cancer Cousin   . Colon cancer Neg Hx     No Active Allergies  Medication list reviewed and updated in full in [place] [facility] [facility].  GEN: No fevers, chills. Nontoxic. Primarily MSK c/o today. MSK: Detailed in the HPI GI: tolerating PO intake without difficulty Neuro: No numbness, parasthesias, or tingling associated. Otherwise the pertinent positives of the ROS are noted above.   Objective:   BP 140/90   Pulse 80   Temp 98.4 F (36.9 C) (Oral)   Ht 5' 4.5" (1.638 m)   Wt 165 lb 8 oz (75.1 kg)   BMI 27.97 kg/m    GEN: WDWN, NAD, Non-toxic, Alert & Oriented x 3 HEENT: Atraumatic, Normocephalic.  Ears and Nose: No external deformity. EXTR: No clubbing/cyanosis/edema NEURO: Normal gait.  PSYCH: Normally interactive. Conversant. Not depressed or anxious appearing.  Calm demeanor.    She is nontender throughout all of her metacarpals.  Phalanges 2 through 5 are nontender.  She does have swelling at the MCP on the right, and she has notable tenderness on the flexor surface.  Flexion and extension are tender throughout.  Less tenderness at the IP joint.  She does also  have some soft tissue tenderness throughout this area.  No tenderness at the distal radius, ulna, as well as in the carpal region.  Radiology: Dg Finger Thumb Right  Result Date: 04/29/2018 CLINICAL DATA:  Acute RIGHT thumb pain following injury 1 week ago. Initial encounter. EXAM: RIGHT THUMB 2+V COMPARISON:  None. FINDINGS: There is no evidence of fracture or dislocation. There is no evidence of arthropathy or other focal bone abnormality. Soft tissues are unremarkable IMPRESSION: Negative. Electronically Signed  By: Margarette Canada M.D.   On: 04/29/2018 13:37    The radiological images were independently reviewed by myself in the office and results were reviewed with the patient. My independent interpretation of images:    Assessment and Plan:   Thumb pain, right - Plan: DG Finger Thumb Right  Rotator cuff tendonitis, right  There appears to be a small sesamoid fracture.  Radiology did not appear to notice this, but nevertheless I showed this to the patient and believe that is apparent on 2 views.  I will place the patient in a thumb spica splint for 3 weeks and then recheck.  We also discussed possibly doing a subacromial corticosteroid injection, but at this time given her active fracture, I am good to hold off on that.  If she is doing well at recheck, we can consider that at that time.  Follow-up: Return in about 3 weeks (around 05/20/2018).  Meds ordered this encounter  Medications  . ibuprofen (ADVIL,MOTRIN) 800 MG tablet    Sig: Take 1 tablet (800 mg total) by mouth every 8 (eight) hours as needed.    Dispense:  90 tablet    Refill:  3  . traMADol (ULTRAM) 50 MG tablet    Sig: TAKE 1 TABLET (50 MG TOTAL) BY MOUTH EVERY 6 (SIX) HOURS.    Dispense:  30 tablet    Refill:  1   Orders Placed This Encounter  Procedures  . DG Finger Thumb Right    Signed,  Lakenzie Mcclafferty T. Katarina Riebe, MD   Allergies as of 04/29/2018   No Active Allergies     Medication List        Accurate as  of 04/29/18 11:59 PM. Always use your most recent med list.          cetirizine 10 MG tablet Commonly known as:  ZYRTEC Take 1 tablet (10 mg total) by mouth daily.   escitalopram 20 MG tablet Commonly known as:  LEXAPRO TAKE 1 TABLET BY MOUTH EVERY DAY   fluticasone 50 MCG/ACT nasal spray Commonly known as:  FLONASE PLACE 2 SPRAYS INTO BOTH NOSTRILS DAILY.   ibuprofen 800 MG tablet Commonly known as:  ADVIL,MOTRIN Take 1 tablet (800 mg total) by mouth every 8 (eight) hours as needed.   multivitamin tablet Take 1 tablet by mouth daily.   omeprazole 20 MG capsule Commonly known as:  PRILOSEC TAKE 1 CAPSULE (20 MG TOTAL) BY MOUTH DAILY.   pravastatin 20 MG tablet Commonly known as:  PRAVACHOL TAKE 1 TABLET BY MOUTH EVERY DAY   promethazine 25 MG tablet Commonly known as:  PHENERGAN Take 1 tablet (25 mg total) by mouth every 6 (six) hours as needed for nausea or vomiting.   traMADol 50 MG tablet Commonly known as:  ULTRAM TAKE 1 TABLET (50 MG TOTAL) BY MOUTH EVERY 6 (SIX) HOURS.   VITAMIN B-12 PO Take by mouth daily.   zolmitriptan 5 MG tablet Commonly known as:  ZOMIG TAKE 1 TABLET BY MOUTH EVERY DAY AS NEEDED FOR MIGRAINE

## 2018-05-09 ENCOUNTER — Other Ambulatory Visit: Payer: Self-pay | Admitting: Family Medicine

## 2018-05-14 ENCOUNTER — Other Ambulatory Visit: Payer: Self-pay | Admitting: Family Medicine

## 2018-05-17 ENCOUNTER — Encounter: Payer: Self-pay | Admitting: Family Medicine

## 2018-05-19 ENCOUNTER — Other Ambulatory Visit: Payer: Self-pay

## 2018-05-19 DIAGNOSIS — Z01419 Encounter for gynecological examination (general) (routine) without abnormal findings: Secondary | ICD-10-CM

## 2018-05-19 DIAGNOSIS — E785 Hyperlipidemia, unspecified: Secondary | ICD-10-CM

## 2018-05-19 DIAGNOSIS — F419 Anxiety disorder, unspecified: Secondary | ICD-10-CM

## 2018-05-19 DIAGNOSIS — E538 Deficiency of other specified B group vitamins: Secondary | ICD-10-CM

## 2018-05-19 NOTE — Progress Notes (Signed)
Created future lab order per TA/thx dmf

## 2018-05-20 ENCOUNTER — Ambulatory Visit: Payer: BLUE CROSS/BLUE SHIELD | Admitting: Family Medicine

## 2018-05-20 ENCOUNTER — Other Ambulatory Visit (INDEPENDENT_AMBULATORY_CARE_PROVIDER_SITE_OTHER): Payer: BLUE CROSS/BLUE SHIELD

## 2018-05-20 ENCOUNTER — Encounter: Payer: Self-pay | Admitting: Family Medicine

## 2018-05-20 VITALS — BP 160/86 | HR 78 | Temp 98.1°F | Ht 64.5 in | Wt 167.0 lb

## 2018-05-20 DIAGNOSIS — M7581 Other shoulder lesions, right shoulder: Secondary | ICD-10-CM

## 2018-05-20 DIAGNOSIS — E785 Hyperlipidemia, unspecified: Secondary | ICD-10-CM

## 2018-05-20 DIAGNOSIS — M79644 Pain in right finger(s): Secondary | ICD-10-CM

## 2018-05-20 DIAGNOSIS — E538 Deficiency of other specified B group vitamins: Secondary | ICD-10-CM | POA: Diagnosis not present

## 2018-05-20 DIAGNOSIS — Z01419 Encounter for gynecological examination (general) (routine) without abnormal findings: Secondary | ICD-10-CM | POA: Diagnosis not present

## 2018-05-20 DIAGNOSIS — F419 Anxiety disorder, unspecified: Secondary | ICD-10-CM | POA: Diagnosis not present

## 2018-05-20 DIAGNOSIS — M7551 Bursitis of right shoulder: Secondary | ICD-10-CM

## 2018-05-20 LAB — COMPREHENSIVE METABOLIC PANEL
ALBUMIN: 4.3 g/dL (ref 3.5–5.2)
ALK PHOS: 65 U/L (ref 39–117)
ALT: 15 U/L (ref 0–35)
AST: 18 U/L (ref 0–37)
BUN: 16 mg/dL (ref 6–23)
CALCIUM: 9.3 mg/dL (ref 8.4–10.5)
CHLORIDE: 101 meq/L (ref 96–112)
CO2: 33 mEq/L — ABNORMAL HIGH (ref 19–32)
Creatinine, Ser: 0.76 mg/dL (ref 0.40–1.20)
GFR: 82.03 mL/min (ref >60.00)
Glucose, Bld: 87 mg/dL (ref 70–99)
POTASSIUM: 4.1 meq/L (ref 3.5–5.1)
Sodium: 139 mEq/L (ref 135–145)
TOTAL PROTEIN: 7.4 g/dL (ref 6.0–8.3)
Total Bilirubin: 0.4 mg/dL (ref 0.2–1.2)

## 2018-05-20 LAB — CBC WITH DIFFERENTIAL/PLATELET
Basophils Absolute: 0 10*3/uL (ref 0.0–0.1)
Basophils Relative: 0.7 % (ref 0.0–3.0)
EOS PCT: 2.9 % (ref 0.0–5.0)
Eosinophils Absolute: 0.2 10*3/uL (ref 0.0–0.7)
HEMATOCRIT: 37.3 % (ref 36.0–46.0)
HEMOGLOBIN: 12.4 g/dL (ref 12.0–15.0)
LYMPHS PCT: 21.2 % (ref 12.0–46.0)
Lymphs Abs: 1.3 10*3/uL (ref 0.7–4.0)
MCHC: 33.2 g/dL (ref 30.0–36.0)
MCV: 85.7 fl (ref 78.0–100.0)
MONOS PCT: 6.9 % (ref 3.0–12.0)
Monocytes Absolute: 0.4 10*3/uL (ref 0.1–1.0)
Neutro Abs: 4.2 10*3/uL (ref 1.4–7.7)
Neutrophils Relative %: 68.3 % (ref 43.0–77.0)
Platelets: 252 10*3/uL (ref 150.0–400.0)
RBC: 4.35 Mil/uL (ref 3.87–5.11)
RDW: 14.1 % (ref 11.5–15.5)
WBC: 6.2 10*3/uL (ref 4.0–10.5)

## 2018-05-20 LAB — LIPID PANEL
CHOLESTEROL: 260 mg/dL — AB (ref 0–200)
HDL: 63.8 mg/dL (ref >39.00)
LDL Cholesterol: 172 mg/dL — ABNORMAL HIGH (ref 0–99)
NonHDL: 196.06
Total CHOL/HDL Ratio: 4
Triglycerides: 120 mg/dL (ref 0.0–149.0)
VLDL: 24 mg/dL (ref 0.0–40.0)

## 2018-05-20 LAB — TSH: TSH: 2.47 u[IU]/mL (ref 0.35–4.50)

## 2018-05-20 LAB — VITAMIN B12

## 2018-05-20 MED ORDER — METHYLPREDNISOLONE ACETATE 40 MG/ML IJ SUSP
80.0000 mg | Freq: Once | INTRAMUSCULAR | Status: AC
  Administered 2018-05-20: 80 mg via INTRA_ARTICULAR

## 2018-05-20 NOTE — Progress Notes (Signed)
Dr. Frederico Hamman T. Janes Colegrove, MD, [facility] [place], [address] Phone: [phone] Fax: [phone]  05/20/2018  Patient: [name], MRN: [id number], DOB: [birth year], 62 y.o.  Primary Physician:  Lucille Passy, MD   Chief Complaint  Patient presents with  . Follow-up    Right thumb & Right Shoulder   Subjective:   [name] is a 61 y.o. very pleasant female patient who presents with the following:  F/u R thumb and R shoulder: I saw her on April 29, 2018, and at that point I thought that she had a sesamoid fracture on her thumb.  I placed her in a thumb spica splint, and she is now feeling quite a bit better from that.  She is still having some swelling in her MCP joint on the right, but globally this is doing a lot better.  R thumb is doing much better. Still with some swelling, but has been wearing her thumb spica splint.   Ongoing RTC tendonitis and subac bursitis. Did some PT and now with HEP.  She is still having a persistent ache in her shoulder and a pain with abduction.  She is having some pain in a T-shirt distribution, and having some pain at night.  Her motion is still preserved.  Subac inj, R  Past Medical History, Surgical History, Social History, Family History, Problem List, Medications, and Allergies have been reviewed and updated if relevant.  Patient Active Problem List   Diagnosis Date Noted  . Varicose veins of bilateral lower extremities with pain 06/09/2017  . Menopausal symptoms 02/16/2015  . Vitamin B 12 deficiency 04/11/2014  . Stress incontinence 08/31/2013  . Cervico-occipital neuralgia 06/01/2013  . HLD (hyperlipidemia) 03/09/2012  . Migraine 12/31/2011  . Anxiety 12/31/2011  . Depression 12/31/2011  . Breast cancer ([place]) 12/31/2011    Past Medical History:  Diagnosis Date  . Anemia   . Anxiety   . Blood transfusion without reported diagnosis   . Breast cancer ([place])  02/27/2007  . GERD (gastroesophageal reflux disease)   . Hypercholesterolemia   . Migraine   . Personal history of radiation therapy 2008   Right Breast Cancer  . Wears dentures    partial upper    Past Surgical History:  Procedure Laterality Date  . ABDOMINAL HYSTERECTOMY    . BREAST BIOPSY Right 02/27/2007  . BREAST LUMPECTOMY Right 04/02/2007  . BREAST SURGERY  01/14/2013   breast reduction left side  . BROW LIFT Bilateral 06/17/2017   Procedure: BLEPHAROPLASTY UPPER EYELID WITH EXCESS SKIN;  Surgeon: Karle Starch, MD;  Location: [facility];  Service: Ophthalmology;  Laterality: Bilateral;  . CESAREAN SECTION     two times  . COLONOSCOPY    . FOOT SURGERY Right   . PTOSIS REPAIR Bilateral 06/17/2017   Procedure: PTOSIS REPAIR RESECT EX;  Surgeon: Karle Starch, MD;  Location: [facility];  Service: Ophthalmology;  Laterality: Bilateral;  . REDUCTION MAMMAPLASTY Left     Social History   Socioeconomic History  . Marital status: Married    Spouse name: Not on file  . Number of children: Not on file  . Years of education: Not on file  . Highest education level: Not on file  Occupational History  . Not on file  Social Needs  . Financial resource strain: Not on file  . Food insecurity:    Worry: Not on file    Inability:  Not on file  . Transportation needs:    Medical: Not on file    Non-medical: Not on file  Tobacco Use  . Smoking status: Never Smoker  . Smokeless tobacco: Never Used  Substance and Sexual Activity  . Alcohol use: No    Alcohol/week: 0.0 standard drinks  . Drug use: No  . Sexual activity: Yes    Partners: Male    Birth control/protection: Surgical  Lifestyle  . Physical activity:    Days per week: Not on file    Minutes per session: Not on file  . Stress: Not on file  Relationships  . Social connections:    Talks on phone: Not on file    Gets together: Not on file    Attends religious service: Not on file    Active  member of club or organization: Not on file    Attends meetings of clubs or organizations: Not on file    Relationship status: Not on file  . Intimate partner violence:    Fear of current or ex partner: Not on file    Emotionally abused: Not on file    Physically abused: Not on file    Forced sexual activity: Not on file  Other Topics Concern  . Not on file  Social History Narrative  . Not on file    Family History  Problem Relation Age of Onset  . Thyroid disease Mother   . Hypertension Mother   . Hyperlipidemia Mother   . Alzheimer's disease Mother   . Heart failure Mother   . Esophageal cancer Father   . Hyperlipidemia Father   . Heart murmur Father   . Colon polyps Father   . Diabetes Sister   . Alzheimer's disease Maternal Aunt   . Alzheimer's disease Maternal Uncle   . Diabetes Paternal Uncle   . Alzheimer's disease Maternal Grandmother   . Diabetes Paternal Grandmother   . Breast cancer Sister   . Irritable bowel syndrome Sister   . Ulcerative colitis Sister        ????  . Breast cancer Cousin   . Breast cancer Cousin   . Colon cancer Neg Hx     No Active Allergies  Medication list reviewed and updated in full in [facility].  GEN: No fevers, chills. Nontoxic. Primarily MSK c/o today. MSK: Detailed in the HPI GI: tolerating PO intake without difficulty Neuro: No numbness, parasthesias, or tingling associated. Otherwise the pertinent positives of the ROS are noted above.   Objective:   BP (!) 160/86   Pulse 78   Temp 98.1 F (36.7 C) (Oral)   Ht 5' 4.5" (1.638 m)   Wt 167 lb (75.8 kg)   BMI 28.22 kg/m    GEN: Well-developed,well-nourished,in no acute distress; alert,appropriate and cooperative throughout examination HEENT: Normocephalic and atraumatic without obvious abnormalities. Ears, externally no deformities PULM: Breathing comfortably in no respiratory distress EXT: No clubbing, cyanosis, or edema PSYCH: Normally interactive.  Cooperative during the interview. Pleasant. Friendly and conversant. Not anxious or depressed appearing. Normal, full affect.  Shoulder: R Inspection: No muscle wasting or winging Ecchymosis/edema: neg  AC joint, scapula, clavicle: NT Cervical spine: NT, full ROM Spurling's: neg Abduction: full, 5/5 Flexion: full, 5/5 IR, full, lift-off: 5/5 ER at neutral: full, 5/5 AC crossover: neg Neer: pos Hawkins: pos Drop Test: neg Empty Can: pos Supraspinatus insertion: mild-mod T Bicipital groove: NT Speed's: neg Yergason's: neg Sulcus sign: neg Scapular dyskinesis: none C5-T1 intact  Neuro: Sensation  intact Grip 5/5   At the right thumb, motion is preserved.  There is some swelling at the MCP joint, no significant swelling at the [facility] joint.  Extension and flexion are preserved, the patient makes a normal composite fist.  The volar aspect of the MCP joint, area of prior maximal pain is dramatically improved.  Radiology:  Assessment and Plan:   Rotator cuff tendonitis, right - Plan: methylPREDNISolone acetate (DEPO-MEDROL) injection 80 mg  Subacromial bursitis of right shoulder joint - Plan: methylPREDNISolone acetate (DEPO-MEDROL) injection 80 mg  Thumb pain, right  Probable prior sesamoid fracture is improved at this point with some residual MCP swelling.  Continue with home exercise program, and she has been able to do this a little bit more now that her thumbs: Down.  I am going to do a subacromial injection.  She is wanting to be as conservative as possible.  If her symptoms persist with a failure to improve her multiple of months of conservative care, a reasonable next step would be to get an MRI of her shoulder and consider potential arthroscopy if symptoms remain severe.   SubAC Injection, R Verbal consent was obtained from the patient. Risks (including rare infection), benefits, and alternatives were explained. Patient prepped with Chloraprep and Ethyl Chloride used for  anesthesia. The subacromial space was injected using the posterior approach. The patient tolerated the procedure well and had decreased pain post injection. No complications. Injection: 8 cc of Lidocaine 1% and 2 mL of Depo-Medrol 40 mg. Needle: 22 gauge   Follow-up: No follow-ups on file.  Meds ordered this encounter  Medications  . methylPREDNISolone acetate (DEPO-MEDROL) injection 80 mg   Signed,  Jerzy Roepke T. Hannie Shoe, MD   Allergies as of 05/20/2018   No Active Allergies     Medication List        Accurate as of 05/20/18 11:59 PM. Always use your most recent med list.          cetirizine 10 MG tablet Commonly known as:  ZYRTEC Take 1 tablet (10 mg total) by mouth daily.   escitalopram 20 MG tablet Commonly known as:  LEXAPRO TAKE 1 TABLET BY MOUTH EVERY DAY   fluticasone 50 MCG/ACT nasal spray Commonly known as:  FLONASE PLACE 2 SPRAYS INTO BOTH NOSTRILS DAILY.   ibuprofen 800 MG tablet Commonly known as:  ADVIL,MOTRIN Take 1 tablet (800 mg total) by mouth every 8 (eight) hours as needed.   multivitamin tablet Take 1 tablet by mouth daily.   omeprazole 20 MG capsule Commonly known as:  PRILOSEC TAKE 1 CAPSULE (20 MG TOTAL) BY MOUTH DAILY.   pravastatin 20 MG tablet Commonly known as:  PRAVACHOL TAKE 1 TABLET BY MOUTH EVERY DAY   promethazine 25 MG tablet Commonly known as:  PHENERGAN Take 1 tablet (25 mg total) by mouth every 6 (six) hours as needed for nausea or vomiting.   traMADol 50 MG tablet Commonly known as:  ULTRAM TAKE 1 TABLET (50 MG TOTAL) BY MOUTH EVERY 6 (SIX) HOURS.   VITAMIN B-12 PO Take by mouth daily.   zolmitriptan 5 MG tablet Commonly known as:  ZOMIG TAKE 1 TABLET BY MOUTH EVERY DAY AS NEEDED FOR MIGRAINE

## 2018-05-25 ENCOUNTER — Encounter: Payer: Self-pay | Admitting: Family Medicine

## 2018-05-25 ENCOUNTER — Ambulatory Visit (INDEPENDENT_AMBULATORY_CARE_PROVIDER_SITE_OTHER): Payer: BLUE CROSS/BLUE SHIELD | Admitting: Family Medicine

## 2018-05-25 VITALS — BP 130/82 | HR 78 | Temp 99.2°F | Ht 65.0 in | Wt 165.6 lb

## 2018-05-25 DIAGNOSIS — J309 Allergic rhinitis, unspecified: Secondary | ICD-10-CM

## 2018-05-25 DIAGNOSIS — Z0000 Encounter for general adult medical examination without abnormal findings: Secondary | ICD-10-CM

## 2018-05-25 DIAGNOSIS — N6310 Unspecified lump in the right breast, unspecified quadrant: Secondary | ICD-10-CM | POA: Diagnosis not present

## 2018-05-25 DIAGNOSIS — F329 Major depressive disorder, single episode, unspecified: Secondary | ICD-10-CM | POA: Diagnosis not present

## 2018-05-25 DIAGNOSIS — E785 Hyperlipidemia, unspecified: Secondary | ICD-10-CM

## 2018-05-25 DIAGNOSIS — G44229 Chronic tension-type headache, not intractable: Secondary | ICD-10-CM

## 2018-05-25 DIAGNOSIS — Z1239 Encounter for other screening for malignant neoplasm of breast: Secondary | ICD-10-CM

## 2018-05-25 DIAGNOSIS — Z853 Personal history of malignant neoplasm of breast: Secondary | ICD-10-CM

## 2018-05-25 DIAGNOSIS — C50111 Malignant neoplasm of central portion of right female breast: Secondary | ICD-10-CM

## 2018-05-25 DIAGNOSIS — F32A Depression, unspecified: Secondary | ICD-10-CM

## 2018-05-25 DIAGNOSIS — G43009 Migraine without aura, not intractable, without status migrainosus: Secondary | ICD-10-CM

## 2018-05-25 MED ORDER — ZOLMITRIPTAN 5 MG PO TABS: ORAL_TABLET | ORAL | 1 refills | Status: DC

## 2018-05-25 MED ORDER — OMEPRAZOLE 20 MG PO CPDR: 20.0000 mg | DELAYED_RELEASE_CAPSULE | Freq: Every day | ORAL | 2 refills | Status: DC

## 2018-05-25 MED ORDER — ESCITALOPRAM OXALATE 20 MG PO TABS: 20.0000 mg | ORAL_TABLET | Freq: Every day | ORAL | 1 refills | Status: DC

## 2018-05-25 MED ORDER — PROMETHAZINE HCL 25 MG PO TABS: 25.0000 mg | ORAL_TABLET | Freq: Four times a day (QID) | ORAL | 1 refills | Status: DC | PRN

## 2018-05-25 MED ORDER — IBUPROFEN 800 MG PO TABS: 800.0000 mg | ORAL_TABLET | Freq: Three times a day (TID) | ORAL | 3 refills | Status: DC | PRN

## 2018-05-25 MED ORDER — TRAMADOL HCL 50 MG PO TABS: ORAL_TABLET | ORAL | 1 refills | Status: DC

## 2018-05-25 MED ORDER — FLUTICASONE PROPIONATE 50 MCG/ACT NA SUSP: 2.0000 | Freq: Every day | NASAL | 6 refills | Status: DC

## 2018-05-25 MED ORDER — CETIRIZINE HCL 10 MG PO TABS: 10.0000 mg | ORAL_TABLET | Freq: Every day | ORAL | 3 refills | Status: DC

## 2018-05-25 NOTE — Assessment & Plan Note (Signed)
New- with h/o left breast CA. Due for screening mammogram. Order bilateral diag mammogram today with [place] if indicated for further evaluation. The patient indicates understanding of these issues and agrees with the plan.

## 2018-05-25 NOTE — Patient Instructions (Addendum)
Great to see you.  Please call the breast center at [phone] to schedule your mammogram.  We will call you with cardiology appointment.  Cologuard will be sent to your home address.

## 2018-05-25 NOTE — Progress Notes (Signed)
Subjective:   Patient ID: [name], female    DOB: 03/07/1956, 62 y.o.   MRN: [id number]  [name] is a pleasant 62 y.o. year old female who presents to clinic today with Annual Exam (Patient is here today for a CPE. Last Mammogram at [facility] on 10.29.18 WNL RTN in 1 year.  Colonoscopy completed on 6.3.09 and is due now for [facility] to repeat but would like to try Cologuard instead.  She is not due for a Mammogram until October if referral is created at [facility]. Declines flu shot today. Agrees to Hep-C lab but declines HIV.) and Follow-up  on 05/25/2018  HPI:  Health Maintenance  Topic Date Due  . Hepatitis C Screening  [birth year]  . INFLUENZA VACCINE  01/12/2019 (Originally 04/16/2018)  . COLONOSCOPY  03/10/2019  . MAMMOGRAM  07/15/2019  . TETANUS/TDAP  09/01/2023  . HIV Screening  Discontinued   Remote h/o hysterectomy. Mammogram 07/13/17 (remote h/o left breast CA/ resection and tamoxifen) She has intermittently felt a sore mass right breast for past several months. Colonoscopy 02/18/08- due for one now but she would like to try cologuard first instead.  Denies any changes in her bowel habits or blood in her stool.  No family history of colonoscopy.  HLD- she has not been taking pravachol and cholesterol is very high this month due to myalgias. LDL is 172. Crestor and zocor previously caused myalgias.  Lab Results  Component Value Date   CHOL 260 (H) 05/20/2018   HDL 63.80 05/20/2018   LDLCALC 172 (H) 05/20/2018   LDLDIRECT 208.2 08/31/2013   TRIG 120.0 05/20/2018   CHOLHDL 4 05/20/2018   Lab Results  Component Value Date   ALT 15 05/20/2018   AST 18 05/20/2018   ALKPHOS 65 05/20/2018   BILITOT 0.4 05/20/2018   Depression- she does feel lexapro is controlling her symptoms at current dose.     Current Outpatient Medications on File Prior to Visit  Medication Sig Dispense Refill  . Cyanocobalamin (VITAMIN B-12 PO) Take by mouth daily.      . Multiple Vitamin (MULTIVITAMIN) tablet Take 1 tablet by mouth daily.    . pravastatin (PRAVACHOL) 20 MG tablet TAKE 1 TABLET BY MOUTH EVERY DAY 90 tablet 3   No current facility-administered medications on file prior to visit.     No Active Allergies  Past Medical History:  Diagnosis Date  . Anemia   . Anxiety   . Blood transfusion without reported diagnosis   . Breast cancer ([place]) 02/27/2007  . GERD (gastroesophageal reflux disease)   . Hypercholesterolemia   . Migraine   . Personal history of radiation therapy 2008   Right Breast Cancer  . Wears dentures    partial upper    Past Surgical History:  Procedure Laterality Date  . ABDOMINAL HYSTERECTOMY    . BREAST BIOPSY Right 02/27/2007  . BREAST LUMPECTOMY Right 04/02/2007  . BREAST SURGERY  01/14/2013   breast reduction left side  . BROW LIFT Bilateral 06/17/2017   Procedure: BLEPHAROPLASTY UPPER EYELID WITH EXCESS SKIN;  Surgeon: Karle Starch, MD;  Location: [facility];  Service: Ophthalmology;  Laterality: Bilateral;  . CESAREAN SECTION     two times  . COLONOSCOPY    . FOOT SURGERY Right   . PTOSIS REPAIR Bilateral 06/17/2017   Procedure: PTOSIS REPAIR RESECT EX;  Surgeon: Karle Starch, MD;  Location: [facility];  Service: Ophthalmology;  Laterality: Bilateral;  .  REDUCTION MAMMAPLASTY Left     Family History  Problem Relation Age of Onset  . Thyroid disease Mother   . Hypertension Mother   . Hyperlipidemia Mother   . Alzheimer's disease Mother   . Heart failure Mother   . Esophageal cancer Father   . Hyperlipidemia Father   . Heart murmur Father   . Colon polyps Father   . Diabetes Sister   . Alzheimer's disease Maternal Aunt   . Alzheimer's disease Maternal Uncle   . Diabetes Paternal Uncle   . Alzheimer's disease Maternal Grandmother   . Diabetes Paternal Grandmother   . Breast cancer Sister   . Irritable bowel syndrome Sister   . Ulcerative colitis Sister        ????  .  Breast cancer Cousin   . Breast cancer Cousin   . Colon cancer Neg Hx     Social History   Socioeconomic History  . Marital status: Married    Spouse name: Not on file  . Number of children: Not on file  . Years of education: Not on file  . Highest education level: Not on file  Occupational History  . Not on file  Social Needs  . Financial resource strain: Not on file  . Food insecurity:    Worry: Not on file    Inability: Not on file  . Transportation needs:    Medical: Not on file    Non-medical: Not on file  Tobacco Use  . Smoking status: Never Smoker  . Smokeless tobacco: Never Used  Substance and Sexual Activity  . Alcohol use: No    Alcohol/week: 0.0 standard drinks  . Drug use: No  . Sexual activity: Yes    Partners: Male    Birth control/protection: Surgical  Lifestyle  . Physical activity:    Days per week: Not on file    Minutes per session: Not on file  . Stress: Not on file  Relationships  . Social connections:    Talks on phone: Not on file    Gets together: Not on file    Attends religious service: Not on file    Active member of club or organization: Not on file    Attends meetings of clubs or organizations: Not on file    Relationship status: Not on file  . Intimate partner violence:    Fear of current or ex partner: Not on file    Emotionally abused: Not on file    Physically abused: Not on file    Forced sexual activity: Not on file  Other Topics Concern  . Not on file  Social History Narrative  . Not on file   The PMH, PSH, Social History, Family History, Medications, and allergies have been reviewed in [facility], and have been updated if relevant.   Review of Systems  Constitutional: Negative.   HENT: Negative.   Eyes: Negative.   Respiratory: Negative.   Cardiovascular: Negative.   Gastrointestinal: Negative.   Endocrine: Negative.   Genitourinary: Negative.   Musculoskeletal: Negative.   Allergic/Immunologic: Negative.    Neurological: Negative.   Hematological: Negative.   Psychiatric/Behavioral: Negative.   All other systems reviewed and are negative.      Objective:    BP 130/82 (BP Location: Left Arm, Patient Position: Sitting, Cuff Size: Normal)   Pulse 78   Temp 99.2 F (37.3 C) (Oral)   Ht 5\' 5"  (1.651 m)   Wt 165 lb 9.6 oz (75.1 kg)   SpO2 96%  BMI 27.56 kg/m    Physical Exam  Constitutional: She is oriented to person, place, and time. She appears well-developed and well-nourished. No distress.  HENT:  Head: Normocephalic and atraumatic.  Eyes: EOM are normal.  Neck: Normal range of motion.  Cardiovascular: Normal rate and regular rhythm.  Pulmonary/Chest: Effort normal and breath sounds normal. Right breast exhibits mass and tenderness. Right breast exhibits no inverted nipple and no nipple discharge. Left breast exhibits no inverted nipple, no mass, no nipple discharge, no skin change and no tenderness.    Abdominal: Soft. Bowel sounds are normal.  Musculoskeletal: Normal range of motion. She exhibits no edema.  Neurological: She is alert and oriented to person, place, and time. No cranial nerve deficit.  Skin: Skin is warm and dry. She is not diaphoretic.  Psychiatric: She has a normal mood and affect. Her behavior is normal. Judgment and thought content normal.  Nursing note and vitals reviewed.         Assessment & Plan:   Screening for breast cancer - Plan: MM Digital Screening  Allergic rhinitis - Plan: fluticasone (FLONASE) 50 MCG/ACT nasal spray  Migraine without aura and without status migrainosus, not intractable - Plan: zolmitriptan (ZOMIG) 5 MG tablet  Chronic tension-type headache, not intractable - Plan: promethazine (PHENERGAN) 25 MG tablet  Well woman exam without gynecological exam  Hyperlipidemia, unspecified hyperlipidemia type  Depression, unspecified depression type  Malignant neoplasm of central portion of right female breast, unspecified  estrogen receptor status ([place]) No follow-ups on file.

## 2018-05-25 NOTE — Assessment & Plan Note (Signed)
Well controlled on current dose of lexapro. No changes made today.

## 2018-05-25 NOTE — Assessment & Plan Note (Signed)
Reviewed preventive care protocols, scheduled due services, and updated immunizations Discussed nutrition, exercise, diet, and healthy lifestyle.  Cologuard ordered. 

## 2018-05-25 NOTE — Assessment & Plan Note (Signed)
Unfortunately cannot tolerate statins.  Refer to cardiology to consider PCSK9 inhibitors. The patient indicates understanding of these issues and agrees with the plan.

## 2018-06-09 ENCOUNTER — Encounter: Payer: Self-pay | Admitting: Family Medicine

## 2018-06-17 ENCOUNTER — Other Ambulatory Visit: Payer: Self-pay

## 2018-06-17 ENCOUNTER — Encounter: Payer: Self-pay | Admitting: Family Medicine

## 2018-06-17 MED ORDER — AMOXICILLIN-POT CLAVULANATE 875-125 MG PO TABS: ORAL_TABLET | ORAL | 0 refills | Status: DC

## 2018-06-22 ENCOUNTER — Encounter: Payer: Self-pay | Admitting: Family Medicine

## 2018-06-23 ENCOUNTER — Other Ambulatory Visit: Payer: Self-pay

## 2018-06-23 ENCOUNTER — Telehealth: Payer: Self-pay | Admitting: Family Medicine

## 2018-06-23 LAB — COLOGUARD: COLOGUARD: NEGATIVE

## 2018-06-23 MED ORDER — FLUCONAZOLE 150 MG PO TABS: 150.0000 mg | ORAL_TABLET | Freq: Once | ORAL | 0 refills | Status: AC

## 2018-06-23 NOTE — Telephone Encounter (Signed)
Copied from [facility] [id number]. Topic: General - Other >> Jun 23, 2018  9:05 AM Lennox Solders wrote: Reason for CRM: pt sent mychart message to dr Deborra Medina. Pt is on abx and now has vaginal itching and burning. Pt would [facility] [place] [address]

## 2018-06-23 NOTE — Telephone Encounter (Signed)
Completed/thx dmf 

## 2018-06-25 ENCOUNTER — Encounter: Payer: Self-pay | Admitting: Family Medicine

## 2018-06-25 ENCOUNTER — Ambulatory Visit: Payer: BLUE CROSS/BLUE SHIELD | Admitting: Family Medicine

## 2018-06-25 VITALS — BP 130/80 | HR 76 | Temp 99.2°F | Ht 65.0 in | Wt 168.2 lb

## 2018-06-25 DIAGNOSIS — J309 Allergic rhinitis, unspecified: Secondary | ICD-10-CM | POA: Diagnosis not present

## 2018-06-25 DIAGNOSIS — B373 Candidiasis of vulva and vagina: Secondary | ICD-10-CM | POA: Diagnosis not present

## 2018-06-25 DIAGNOSIS — R51 Headache: Secondary | ICD-10-CM | POA: Diagnosis not present

## 2018-06-25 DIAGNOSIS — J3089 Other allergic rhinitis: Secondary | ICD-10-CM | POA: Diagnosis not present

## 2018-06-25 DIAGNOSIS — R519 Headache, unspecified: Secondary | ICD-10-CM

## 2018-06-25 DIAGNOSIS — B3731 Acute candidiasis of vulva and vagina: Secondary | ICD-10-CM

## 2018-06-25 MED ORDER — FLUTICASONE PROPIONATE 50 MCG/ACT NA SUSP: 2.0000 | Freq: Every day | NASAL | 6 refills | Status: DC

## 2018-06-25 MED ORDER — AMOXICILLIN-POT CLAVULANATE 875-125 MG PO TABS: ORAL_TABLET | ORAL | 0 refills | Status: DC

## 2018-06-25 MED ORDER — FLUCONAZOLE 150 MG PO TABS: ORAL_TABLET | ORAL | 0 refills | Status: DC

## 2018-06-25 MED ORDER — TERCONAZOLE 0.4 % VA CREA: 1.0000 | TOPICAL_CREAM | Freq: Every day | VAGINAL | 0 refills | Status: DC

## 2018-06-25 NOTE — Assessment & Plan Note (Addendum)
Persistent- does seem consistent with sinus infection.  Will treat with full course of Augmentin.  Also treat her allergic rhinitis as documented below.  Although she is edentulous on the top back area of her mouth, I do want her to follow up with her dentist if symptoms persist- she does have a crown just adjacent to the the partial and I cannot rule out gum disease either. The patient indicates understanding of these issues and agrees with the plan.

## 2018-06-25 NOTE — Assessment & Plan Note (Signed)
Recurrent with abx. Send in topical terazol and oral diflucan (several doses which she can take every 3 days for persistent symptoms). Call or return to clinic prn if these symptoms worsen or fail to improve as anticipated. The patient indicates understanding of these issues and agrees with the plan.

## 2018-06-25 NOTE — Assessment & Plan Note (Signed)
Deteriorated. D/c zyrtec. Start allegra D, flonase. Call or return to clinic prn if these symptoms worsen or fail to improve as anticipated. The patient indicates understanding of these issues and agrees with the plan.
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
## 2018-06-25 NOTE — Patient Instructions (Addendum)
Great to see you.  Start taking Allegra D, flonase, Augmentin twice daily x 10 days.  Diflucan 150 mg by mouth every 3 days for 3- 5 doses.  Terazol for topical irritation.  If you continue to have symptoms, call me and make an appointment with your dentist.

## 2018-07-08 ENCOUNTER — Encounter: Payer: Self-pay | Admitting: Internal Medicine

## 2018-07-08 ENCOUNTER — Ambulatory Visit: Payer: BLUE CROSS/BLUE SHIELD | Admitting: Internal Medicine

## 2018-07-08 VITALS — BP 124/82 | HR 81 | Ht 64.0 in | Wt 169.8 lb

## 2018-07-08 DIAGNOSIS — Z79899 Other long term (current) drug therapy: Secondary | ICD-10-CM | POA: Diagnosis not present

## 2018-07-08 DIAGNOSIS — Z8249 Family history of ischemic heart disease and other diseases of the circulatory system: Secondary | ICD-10-CM

## 2018-07-08 DIAGNOSIS — E7800 Pure hypercholesterolemia, unspecified: Secondary | ICD-10-CM

## 2018-07-08 MED ORDER — ROSUVASTATIN CALCIUM 5 MG PO TABS: 5.0000 mg | ORAL_TABLET | ORAL | 2 refills | Status: DC

## 2018-07-08 NOTE — Patient Instructions (Signed)
Medication Instructions:  Your physician has recommended you make the following change in your medication:  1- START Crestor 5 mg (1 tablet) by mouth every Monday, Wednesday and Friday.  If you need a refill on your cardiac medications before your next appointment, please call your pharmacy.   Lab work: Your physician recommends that you return for lab work in: [place], AROUND October 08, 2018.  (LIPID, ALT) - You will need to be FASTING the morning of the lab work. - Please make sure to have done prior to appointment with Dr End.  Please go to the [facility]. You will check in at the front desk to the right as you walk into the atrium. Valet Parking is offered if needed.    If you have labs (blood work) drawn today and your tests are completely normal, you will receive your results only by: [name] MyChart Message (if you have MyChart) OR . A paper copy in the mail If you have any lab test that is abnormal or we need to change your treatment, we will call you to review the results.  Testing/Procedures: Your physician has order a CT Calcium Scoring. This procedure uses special x-ray equipment to produce pictures of the coronary arteries to determine if they are blocked or narrowed by the buildup of plaque - an indicator for atherosclerosis or coronary artery disease (CAD). PLEASE CALL [phone] TO SCHEDULE. It is $150.00 for the test.  Follow-Up: At [facility], you and your health needs are our priority.  As part of our continuing mission to provide you with exceptional heart care, we have created designated Provider Care Teams.  These Care Teams include your primary Cardiologist (physician) and Advanced Practice Providers (APPs -  Physician Assistants and Nurse Practitioners) who all work together to provide you with the care you need, when you need it. You will need a follow up appointment in 3 months.    You may see DR Harrell Gave END or one of the following Advanced Practice  Providers on your designated Care Team:   Murray Hodgkins, NP Christell Faith, PA-C . Marrianne Mood, PA-C

## 2018-07-08 NOTE — Progress Notes (Signed)
New Outpatient Visit Date: 07/08/2018  Referring Provider: Lucille Passy, MD [place], [place] [address]  Chief Complaint: High cholesterol  HPI:  [name] is a 62 y.o. female who is being seen today for the evaluation of hyperlipidemia with history of statin intolerance at the request of Dr. Deborra Medina. She has a history of hyperlipidemia, GERD, right breast cancer, anemia, and anxiety. She was seen in our practice in 2015 by Dr. Acie Fredrickson for evaluation of leg edema.  She was otherwise asymptomatic with normal BNP; further cardiac work-up was not advised.  She has tried multiple statins in the past, including pravastatin, rosuvastatin, and simvastatin; she has not been able to tolerate any of these (even at low doses) due to myalgias.  She has also tried natural alternatives such as red yeast rice without significant improvement in her cholesterol.  [name] has been feeling well.  She denies chest pain, shortness of breath, lightheadedness, and edema.  She notes rare brief palpitations without associated symptoms.  She has never had any cardiac testing in the past.  She eats out frequently and occasionally exercises.  She also remains active cleaning houses.  --------------------------------------------------------------------------------------------------  Cardiovascular History & Procedures: Cardiovascular Problems:  Hyperlipidemia  Risk Factors:  Hyperlipidemia  Cath/PCI:  None  CV Surgery:  None  EP Procedures and Devices:  None  Non-Invasive Evaluation(s):  None  Recent CV Pertinent Labs: Lab Results  Component Value Date   CHOL 260 (H) 05/20/2018   HDL 63.80 05/20/2018   LDLCALC 172 (H) 05/20/2018   LDLDIRECT 208.2 08/31/2013   TRIG 120.0 05/20/2018   CHOLHDL 4 05/20/2018   K 4.1 05/20/2018   K 3.9 10/18/2009   BUN 16 05/20/2018   BUN 12 10/18/2009   CREATININE 0.76 05/20/2018   CREATININE 0.7 10/18/2009     --------------------------------------------------------------------------------------------------  Past Medical History:  Diagnosis Date  . Anemia   . Anxiety   . Blood transfusion without reported diagnosis   . Breast cancer ([place]) 02/27/2007   Right  . GERD (gastroesophageal reflux disease)   . Hypercholesterolemia   . Migraine   . Personal history of radiation therapy 2008   Right  . Wears dentures    partial upper    Past Surgical History:  Procedure Laterality Date  . ABDOMINAL HYSTERECTOMY    . BREAST BIOPSY Right 02/27/2007  . BREAST LUMPECTOMY Right 04/02/2007  . BREAST SURGERY  01/14/2013   breast reduction left side  . BROW LIFT Bilateral 06/17/2017   Procedure: BLEPHAROPLASTY UPPER EYELID WITH EXCESS SKIN;  Surgeon: Karle Starch, MD;  Location: [place];  Service: Ophthalmology;  Laterality: Bilateral;  . CESAREAN SECTION     two times  . COLONOSCOPY    . FOOT SURGERY Right   . PTOSIS REPAIR Bilateral 06/17/2017   Procedure: PTOSIS REPAIR RESECT EX;  Surgeon: Karle Starch, MD;  Location: [place];  Service: Ophthalmology;  Laterality: Bilateral;  . REDUCTION MAMMAPLASTY Left     Current Meds  Medication Sig  . Cyanocobalamin (VITAMIN B-12 PO) Take by mouth daily.  [name] escitalopram (LEXAPRO) 20 MG tablet Take 1 tablet (20 mg total) by mouth daily.  . fluticasone (FLONASE) 50 MCG/ACT nasal spray Place 2 sprays into both nostrils daily.  [name] ibuprofen (ADVIL,MOTRIN) 800 MG tablet Take 1 tablet (800 mg total) by mouth every 8 (eight) hours as needed.  . Multiple Vitamin (MULTIVITAMIN) tablet Take 1 tablet by mouth daily.  [name] omeprazole (PRILOSEC) 20 MG  capsule Take 1 capsule (20 mg total) by mouth daily. (Patient taking differently: Take 20 mg by mouth daily as needed (acid reflux). )  . promethazine (PHENERGAN) 25 MG tablet Take 1 tablet (25 mg total) by mouth every 6 (six) hours as needed for nausea or vomiting.  [name] terconazole (TERAZOL 7)  0.4 % vaginal cream Place 1 applicator vaginally at bedtime.  . traMADol (ULTRAM) 50 MG tablet TAKE 1 TABLET (50 MG TOTAL) BY MOUTH EVERY 6 (SIX) HOURS.  [name] zolmitriptan (ZOMIG) 5 MG tablet TAKE 1 TABLET BY MOUTH EVERY DAY AS NEEDED FOR MIGRAINE    Allergies: Statins  Social History   Tobacco Use  . Smoking status: Never Smoker  . Smokeless tobacco: Never Used  Substance Use Topics  . Alcohol use: No    Alcohol/week: 0.0 standard drinks  . Drug use: No    Family History  Problem Relation Age of Onset  . Thyroid disease Mother   . Hypertension Mother   . Hyperlipidemia Mother   . Alzheimer's disease Mother   . Heart failure Mother   . Esophageal cancer Father   . Hyperlipidemia Father   . Heart murmur Father   . Colon polyps Father   . Diabetes Sister   . Alzheimer's disease Maternal Aunt   . Alzheimer's disease Maternal Uncle   . Diabetes Paternal Uncle   . Alzheimer's disease Maternal Grandmother   . Diabetes Paternal Grandmother   . Breast cancer Sister   . Irritable bowel syndrome Sister   . Ulcerative colitis Sister        ????  . Breast cancer Cousin   . Breast cancer Cousin   . Colon cancer Neg Hx     Review of Systems: A 12-system review of systems was performed and was negative except as noted in the HPI.  --------------------------------------------------------------------------------------------------  Physical Exam: BP 124/82 (BP Location: Right Arm, Patient Position: Sitting, Cuff Size: Normal)   Pulse 81   Ht 5\' 4"  (1.626 m)   Wt 169 lb 12 oz (77 kg)   BMI 29.14 kg/m   General: Well-developed, well-nourished woman seated comfortably in the exam room. HEENT: No conjunctival pallor or scleral icterus. Moist mucous membranes. OP clear. Neck: Supple without lymphadenopathy, thyromegaly, JVD, or HJR. No carotid bruit. Lungs: Normal work of breathing. Clear to auscultation bilaterally without wheezes or crackles. Heart: Regular rate and rhythm  without murmurs, rubs, or gallops. Abd: Bowel sounds present. Soft, NT/ND without hepatosplenomegaly Ext: No lower extremity edema. Radial, PT, and DP pulses are 2+ bilaterally Skin: Warm and dry without rash. Neuro: CNIII-XII intact. Strength and fine-touch sensation intact in upper and lower extremities bilaterally. Psych: Normal mood and affect.  EKG: Normal sinus rhythm with nonspecific ST/T changes.  Lab Results  Component Value Date   WBC 6.2 05/20/2018   HGB 12.4 05/20/2018   HCT 37.3 05/20/2018   MCV 85.7 05/20/2018   PLT 252.0 05/20/2018    Lab Results  Component Value Date   NA 139 05/20/2018   K 4.1 05/20/2018   CL 101 05/20/2018   CO2 33 (H) 05/20/2018   BUN 16 05/20/2018   CREATININE 0.76 05/20/2018   GLUCOSE 87 05/20/2018   ALT 15 05/20/2018    Lab Results  Component Value Date   CHOL 260 (H) 05/20/2018   HDL 63.80 05/20/2018   LDLCALC 172 (H) 05/20/2018   LDLDIRECT 208.2 08/31/2013   TRIG 120.0 05/20/2018   CHOLHDL 4 05/20/2018   --------------------------------------------------------------------------------------------------  ASSESSMENT AND PLAN:  Hyperlipidemia Patient has a long history of elevated LDL with readings up to 201 off statin therapy.  This is suggestive of a familial hyperlipidemia, particularly given her mother's history of markedly elevated cholesterol and ASCVD.  [name] has been intolerant of several statins in the past.  We have discussed treatment options and have agreed to a trial of rosuvastatin 5 mg on Mondays, Wednesdays, and Fridays.  I will plan to recheck a lipid panel and ALT in about 3 months.  We have also discussed lifestyle modifications.  We will perform coronary calcium score CT for further risk stratification.  If there is evidence of significant coronary artery calcification, I would advocate for aggressive lipid control with a goal LDL less than 70.  Ultimately, this may require trial of a PCSK9 inhibitor.  Ezetimibe  would be another option, though I am skeptical that she will achieve adequate LDL control with this alone.  Follow-up: Return to clinic in 3 months.  Nelva Bush, MD 07/08/2018 9:04 AM

## 2018-07-17 ENCOUNTER — Other Ambulatory Visit: Payer: Self-pay | Admitting: Family Medicine

## 2018-07-17 ENCOUNTER — Ambulatory Visit: Payer: BLUE CROSS/BLUE SHIELD

## 2018-07-17 DIAGNOSIS — N6310 Unspecified lump in the right breast, unspecified quadrant: Secondary | ICD-10-CM

## 2018-07-20 ENCOUNTER — Encounter: Payer: Self-pay | Admitting: Family Medicine

## 2018-07-21 ENCOUNTER — Ambulatory Visit
Admission: RE | Admit: 2018-07-21 | Discharge: 2018-07-21 | Disposition: A | Discharge status: Home / Self Care | Payer: BLUE CROSS/BLUE SHIELD | Source: Ambulatory Visit | Attending: Family Medicine | Admitting: Family Medicine

## 2018-07-21 DIAGNOSIS — N6310 Unspecified lump in the right breast, unspecified quadrant: Secondary | ICD-10-CM

## 2018-07-27 ENCOUNTER — Ambulatory Visit (INDEPENDENT_AMBULATORY_CARE_PROVIDER_SITE_OTHER)
Admission: RE | Admit: 2018-07-27 | Discharge: 2018-07-27 | Disposition: A | Discharge status: Home / Self Care | Payer: Self-pay | Source: Ambulatory Visit | Attending: Internal Medicine | Admitting: Internal Medicine

## 2018-07-27 DIAGNOSIS — Z8249 Family history of ischemic heart disease and other diseases of the circulatory system: Secondary | ICD-10-CM

## 2018-07-27 DIAGNOSIS — E7800 Pure hypercholesterolemia, unspecified: Secondary | ICD-10-CM

## 2018-08-15 ENCOUNTER — Other Ambulatory Visit: Payer: Self-pay | Admitting: Family Medicine

## 2018-08-17 NOTE — Telephone Encounter (Addendum)
Last office visit 06/25/2018 for facial swelling.  Diclofenac is not on current medication list.  No future appointments.  Refill?

## 2018-09-28 ENCOUNTER — Encounter: Payer: Self-pay | Admitting: Family Medicine

## 2018-09-29 ENCOUNTER — Other Ambulatory Visit: Payer: Self-pay | Admitting: Family Medicine

## 2018-09-29 DIAGNOSIS — M7551 Bursitis of right shoulder: Secondary | ICD-10-CM

## 2018-09-29 DIAGNOSIS — M25511 Pain in right shoulder: Secondary | ICD-10-CM

## 2018-09-29 DIAGNOSIS — M7581 Other shoulder lesions, right shoulder: Secondary | ICD-10-CM

## 2018-09-29 NOTE — Progress Notes (Signed)
MRI R shoulder

## 2018-09-30 NOTE — Telephone Encounter (Signed)
I don't entirely understand what this means - we may have to contact her to get her new insurance information.

## 2018-10-20 NOTE — Progress Notes (Signed)
Follow-up Outpatient Visit Date: 10/21/2018  Primary Care Provider: Lucille Passy, MD [place] [address]  Chief Complaint: Myalgias  HPI:  Ms. Raetz is a 63 y.o. year-old female with history of hyperlipidemia, GERD, right breast cancer, anemia, and anxiety, who presents for follow-up of hyperlipidemia and cardiac risk stratification.  I met Ms. Pamer in October, at which time we discussed the risks and benefits of lipid treatment.  She has been intolerant of multiple statins and natural alternatives in the past.  Coronary calcium CT in November showed no significant CAC (0 Agatson units).  We also agreed to start rosuvastatin 5 mg on Mondays, Wednesdays, and Fridays.  Today, Ms. Weismann reports that she has been having leg aches and numbness that began about 1 week after starting rosuvastatin.  It sometimes makes it difficult for her to start walking.  She denies injuries and other medication changes.  She has not tried Co-Q10.  She denies chest pain, palpitations, lightheadedness, and edema.  She has mild exertional dyspnea when going up and down the stairs in her home several times, which has been stable for several years.  --------------------------------------------------------------------------------------------------  Cardiovascular History & Procedures: Cardiovascular Problems:  Hyperlipidemia  Risk Factors:  Hyperlipidemia  Cath/PCI:  None  CV Surgery:  None  EP Procedures and Devices:  None  Non-Invasive Evaluation(s):  Coronary calcium score (07/27/2018): CAC 0 (low risk).  No incidental extracardiac findings in the chest.  Recent CV Pertinent Labs: Lab Results  Component Value Date   CHOL 260 (H) 05/20/2018   HDL 63.80 05/20/2018   LDLCALC 172 (H) 05/20/2018   LDLDIRECT 208.2 08/31/2013   TRIG 120.0 05/20/2018   CHOLHDL 4 05/20/2018   K 4.1 05/20/2018   K 3.9 10/18/2009   BUN 16 05/20/2018   BUN 12 10/18/2009   CREATININE 0.76 05/20/2018   CREATININE 0.7 10/18/2009    Past medical and surgical history were reviewed and updated in EPIC.  Current Meds  Medication Sig  . Cyanocobalamin (B-12 PO) Take by mouth daily.  . diclofenac (VOLTAREN) 75 MG EC tablet TAKE 1 TABLET BY MOUTH TWICE A DAY  . escitalopram (LEXAPRO) 20 MG tablet Take 1 tablet (20 mg total) by mouth daily.  . fluticasone (FLONASE) 50 MCG/ACT nasal spray Place 2 sprays into both nostrils daily.  [name] ibuprofen (ADVIL,MOTRIN) 800 MG tablet Take 1 tablet (800 mg total) by mouth every 8 (eight) hours as needed.  . Multiple Vitamin (MULTIVITAMIN) tablet Take 1 tablet by mouth daily.  [name] omeprazole (PRILOSEC) 20 MG capsule Take 1 capsule (20 mg total) by mouth daily. (Patient taking differently: Take 20 mg by mouth daily as needed (acid reflux). )  . promethazine (PHENERGAN) 25 MG tablet Take 1 tablet (25 mg total) by mouth every 6 (six) hours as needed for nausea or vomiting.  . rosuvastatin (CRESTOR) 5 MG tablet Take 1 tablet (5 mg total) by mouth every Monday, Wednesday, and Friday.  . traMADol (ULTRAM) 50 MG tablet TAKE 1 TABLET (50 MG TOTAL) BY MOUTH EVERY 6 (SIX) HOURS.  [name] zolmitriptan (ZOMIG) 5 MG tablet TAKE 1 TABLET BY MOUTH EVERY DAY AS NEEDED FOR MIGRAINE    Allergies: Statins  Social History   Tobacco Use  . Smoking status: Never Smoker  . Smokeless tobacco: Never Used  Substance Use Topics  . Alcohol use: No    Alcohol/week: 0.0 standard drinks  . Drug use: No    Family History  Problem Relation Age of Onset  .  Thyroid disease Mother   . Hypertension Mother   . Hyperlipidemia Mother   . Alzheimer's disease Mother   . Heart failure Mother   . Esophageal cancer Father   . Hyperlipidemia Father   . Heart murmur Father   . Colon polyps Father   . Diabetes Sister   . Alzheimer's disease Maternal Aunt   . Alzheimer's disease Maternal Uncle   . Diabetes Paternal Uncle   . Alzheimer's disease Maternal Grandmother     . Diabetes Paternal Grandmother   . Breast cancer Sister   . Irritable bowel syndrome Sister   . Ulcerative colitis Sister        ????  . Breast cancer Cousin   . Breast cancer Cousin   . Colon cancer Neg Hx     Review of Systems: A 12-system review of systems was performed and was negative except as noted in the HPI.  --------------------------------------------------------------------------------------------------  Physical Exam: BP 138/80 (BP Location: Left Arm, Patient Position: Sitting, Cuff Size: Normal)   Pulse 80   Ht 5' 4.5" (1.638 m)   Wt 173 lb 12 oz (78.8 kg)   BMI 29.36 kg/m   General:  NAD HEENT: No conjunctival pallor or scleral icterus. Moist mucous membranes.  OP clear. Neck: Supple without lymphadenopathy, thyromegaly, JVD, or HJR. Lungs: Normal work of breathing. Clear to auscultation bilaterally without wheezes or crackles. Heart: Regular rate and rhythm without murmurs, rubs, or gallops. Non-displaced PMI. Abd: Bowel sounds present. Soft, NT/ND without hepatosplenomegaly Ext: No lower extremity edema. Radial, PT, and DP pulses are 2+ bilaterally. Skin: Warm and dry without rash.  EKG:  NSR with borderline LVH and nonspecific ST/T changes.  Lab Results  Component Value Date   WBC 6.2 05/20/2018   HGB 12.4 05/20/2018   HCT 37.3 05/20/2018   MCV 85.7 05/20/2018   PLT 252.0 05/20/2018    Lab Results  Component Value Date   NA 139 05/20/2018   K 4.1 05/20/2018   CL 101 05/20/2018   CO2 33 (H) 05/20/2018   BUN 16 05/20/2018   CREATININE 0.76 05/20/2018   GLUCOSE 87 05/20/2018   ALT 15 05/20/2018    Lab Results  Component Value Date   CHOL 260 (H) 05/20/2018   HDL 63.80 05/20/2018   LDLCALC 172 (H) 05/20/2018   LDLDIRECT 208.2 08/31/2013   TRIG 120.0 05/20/2018   CHOLHDL 4 05/20/2018    --------------------------------------------------------------------------------------------------  ASSESSMENT AND PLAN: Hyperlipidemia LDL  chronically elevated with readings up to 201 off therapy, suggestive of heterozygous familial hyperlipidemia.  She notes that her mother and sister both have high cholesterol requiring statin therapy (though she does not know specific values).  She notes myalgias on even low-dose rosuvastatin.  We discussed working on lifestyle modifications alone, adding Co-Q10 to rosuvastatin, or referring her to the lipid clinic to discuss other therapies and have agreed to a trial of Co-Q10 with continued rosuvastatin 5 mg on M, W, and F.  We will check a fasting lipid panel, ALT, and CK at the patient's convenience.  I asked Ms. Phommachanh to contact us in about a month to report if her myalgias have improved.  Recent cardiac CT was reassuring with CAC of 0.  Given lack of angina, no further ischemia testing is indicated at this time.  Dyspnea on exertion Long-standing and stable.  No further testing at this time.  Follow-up: Return to clinic in 6 months.  Nelva Bush, MD 10/21/2018 2:51 PM

## 2018-10-21 ENCOUNTER — Ambulatory Visit: Payer: BLUE CROSS/BLUE SHIELD | Admitting: Internal Medicine

## 2018-10-21 ENCOUNTER — Encounter: Payer: Self-pay | Admitting: Internal Medicine

## 2018-10-21 ENCOUNTER — Ambulatory Visit
Admission: RE | Admit: 2018-10-21 | Discharge: 2018-10-21 | Disposition: A | Discharge status: Home / Self Care | Payer: PRIVATE HEALTH INSURANCE | Source: Ambulatory Visit | Attending: Family Medicine | Admitting: Family Medicine

## 2018-10-21 VITALS — BP 138/80 | HR 80 | Ht 64.5 in | Wt 173.8 lb

## 2018-10-21 DIAGNOSIS — R0609 Other forms of dyspnea: Secondary | ICD-10-CM | POA: Diagnosis not present

## 2018-10-21 DIAGNOSIS — Z79899 Other long term (current) drug therapy: Secondary | ICD-10-CM | POA: Diagnosis not present

## 2018-10-21 DIAGNOSIS — M7581 Other shoulder lesions, right shoulder: Secondary | ICD-10-CM | POA: Diagnosis present

## 2018-10-21 DIAGNOSIS — R0600 Dyspnea, unspecified: Secondary | ICD-10-CM

## 2018-10-21 DIAGNOSIS — E7800 Pure hypercholesterolemia, unspecified: Secondary | ICD-10-CM

## 2018-10-21 DIAGNOSIS — M25511 Pain in right shoulder: Secondary | ICD-10-CM | POA: Insufficient documentation

## 2018-10-21 DIAGNOSIS — M7551 Bursitis of right shoulder: Secondary | ICD-10-CM | POA: Insufficient documentation

## 2018-10-21 MED ORDER — COQ10 100 MG PO CAPS: 1.0000 | ORAL_CAPSULE | ORAL | Status: DC

## 2018-10-21 NOTE — Patient Instructions (Signed)
Medication Instructions:  Your physician has recommended you make the following change in your medication:  1- START CoQ10 over the counter (as recommended dosage from pharmacist).   If you need a refill on your cardiac medications before your next appointment, please call your pharmacy.   Lab work: Your physician recommends that you return for lab work at your [place].  (LIPID, ALT, TOTAL CK). - Please go to the [facility]. You will check in at the front desk to the right as you walk into the atrium. Valet Parking is offered if needed. - You will need to be FASTING.  If you have labs (blood work) drawn today and your tests are completely normal, you will receive your results only by: [name] MyChart Message (if you have MyChart) OR . A paper copy in the mail If you have any lab test that is abnormal or we need to change your treatment, we will call you to review the results.  Testing/Procedures: none  Follow-Up: At [facility], you and your health needs are our priority.  As part of our continuing mission to provide you with exceptional heart care, we have created designated Provider Care Teams.  These Care Teams include your primary Cardiologist (physician) and Advanced Practice Providers (APPs -  Physician Assistants and Nurse Practitioners) who all work together to provide you with the care you need, when you need it. You will need a follow up appointment in 6 months.  Please call our office 2 months in advance to schedule this appointment.  You may see DR Harrell Gave END or one of the following Advanced Practice Providers on your designated Care Team:   Murray Hodgkins, NP Christell Faith, PA-C . Marrianne Mood, PA-C  Any Other Special Instructions Will Be Listed Below (If Applicable).  Stop statin and CoQ10 if your myalgias (aches and pains) continue, and call us for further advice.

## 2018-10-26 ENCOUNTER — Other Ambulatory Visit: Payer: Self-pay | Admitting: Family Medicine

## 2018-10-26 ENCOUNTER — Other Ambulatory Visit
Admission: RE | Admit: 2018-10-26 | Discharge: 2018-10-26 | Disposition: A | Discharge status: Home / Self Care | Payer: PRIVATE HEALTH INSURANCE | Source: Ambulatory Visit | Attending: Internal Medicine | Admitting: Internal Medicine

## 2018-10-26 DIAGNOSIS — M25511 Pain in right shoulder: Secondary | ICD-10-CM

## 2018-10-26 DIAGNOSIS — Z79899 Other long term (current) drug therapy: Secondary | ICD-10-CM | POA: Insufficient documentation

## 2018-10-26 DIAGNOSIS — E7800 Pure hypercholesterolemia, unspecified: Secondary | ICD-10-CM | POA: Insufficient documentation

## 2018-10-26 DIAGNOSIS — M7521 Bicipital tendinitis, right shoulder: Secondary | ICD-10-CM

## 2018-10-26 DIAGNOSIS — M7591 Shoulder lesion, unspecified, right shoulder: Secondary | ICD-10-CM

## 2018-10-26 DIAGNOSIS — M75121 Complete rotator cuff tear or rupture of right shoulder, not specified as traumatic: Secondary | ICD-10-CM

## 2018-10-26 LAB — LIPID PANEL
CHOL/HDL RATIO: 4.3 ratio
Cholesterol: 241 mg/dL — ABNORMAL HIGH (ref 0–200)
HDL: 56 mg/dL (ref >40)
LDL Cholesterol: 141 mg/dL — ABNORMAL HIGH (ref 0–99)
TRIGLYCERIDES: 220 mg/dL — AB (ref <150)
VLDL: 44 mg/dL — ABNORMAL HIGH (ref 0–40)

## 2018-10-26 LAB — CK: Total CK: 74 U/L (ref 38–234)

## 2018-10-26 LAB — ALT: ALT: 23 U/L (ref 0–44)

## 2018-10-29 ENCOUNTER — Encounter: Payer: Self-pay | Admitting: Family Medicine

## 2018-11-02 ENCOUNTER — Encounter: Payer: Self-pay | Admitting: Family Medicine

## 2018-11-03 ENCOUNTER — Encounter: Payer: Self-pay | Admitting: Family Medicine

## 2018-11-03 NOTE — Telephone Encounter (Signed)
Can you check on this for me - messages are confusing.   I referred her to Dr. Tamera Punt, then she told me he was out of network, and now I have another message asking me to refer her to Dr. Tamera Punt and that she thinks he is in network.   I don't think I need to put in another referral.

## 2018-11-09 ENCOUNTER — Other Ambulatory Visit: Payer: Self-pay | Admitting: Family Medicine

## 2018-11-10 NOTE — Telephone Encounter (Signed)
TA-LOV 10.2019/per [place] PMP pt is compliant no red flags/noted in Rx for pt to sched appt in March/Plz advise/thx dmf

## 2018-11-11 ENCOUNTER — Encounter (INDEPENDENT_AMBULATORY_CARE_PROVIDER_SITE_OTHER): Payer: Self-pay | Admitting: Orthopedic Surgery

## 2018-11-11 ENCOUNTER — Telehealth (INDEPENDENT_AMBULATORY_CARE_PROVIDER_SITE_OTHER): Payer: Self-pay | Admitting: Orthopedic Surgery

## 2018-11-11 ENCOUNTER — Ambulatory Visit (INDEPENDENT_AMBULATORY_CARE_PROVIDER_SITE_OTHER): Payer: PRIVATE HEALTH INSURANCE | Admitting: Orthopedic Surgery

## 2018-11-11 DIAGNOSIS — M75121 Complete rotator cuff tear or rupture of right shoulder, not specified as traumatic: Secondary | ICD-10-CM | POA: Diagnosis not present

## 2018-11-11 NOTE — Telephone Encounter (Signed)
Patient scheduled at [facility] for right shoulder arthroscopy on March 23 @10 :30.  Post op appt set for April 1st.  CPM order was faxed to [facility]. [facility] notified.

## 2018-11-12 ENCOUNTER — Encounter (INDEPENDENT_AMBULATORY_CARE_PROVIDER_SITE_OTHER): Payer: Self-pay | Admitting: Orthopedic Surgery

## 2018-11-12 NOTE — Progress Notes (Signed)
Office Visit Note   Patient: [name]           Date of Birth: 1956-04-13           MRN: [id number] Visit Date: 11/11/2018 Requested by: Leah Loffler, MD [place], [place] [address] PCP: Leah Passy, MD  Subjective: Chief Complaint  Patient presents with  . Right Shoulder - Pain    HPI: [name] is a patient with right shoulder pain.  She has had this pain for over a year.  She is actually fallen several times and landed on that right-hand side.  She is right-hand dominant.  She describes pain as well as weakness.  The pain is constant but is worse with use.  Patient has had oral anti-inflammatories as well as subacromial injection.  The injection gave her about 3 days relief.  The pain does wake her from sleep some nights but not all nights.  It does hurt her to lift that arm to brush her hair.  She states that she does have popping in that shoulder and that the pain improves after it pops.  Hurts her to vacuum.  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              ROS: All systems reviewed are negative as they relate to the chief complaint within the history of present illness.  Patient denies  fevers or chills.   Assessment & Plan: Visit Diagnoses:  1. Nontraumatic complete tear of right rotator cuff     Plan: Impression is right shoulder medium-sized rotator cuff tear with intra-articular biceps tendinitis and tendinopathy.  Patient also has an MRI scan [facility] joint arthritis but on exam she is not particularly symptomatic in this region.  Plan at this time is arthroscopy with likely biceps tenodesis and rotator cuff repair.  Risk and benefits are discussed including but not limited to infection nerve vessel damage shoulder stiffness as well as the prolonged rehabilitative process required to become functional again.  I think based on the size of her rotator cuff tear we can get her out of the sling after 2 weeks.  Plan  to use CPM machine postoperatively for the shoulder for the first 2 weeks after surgery as well.  Patient understands the risk and benefits.  All questions answered.  Follow-Up Instructions: No follow-ups on file.   Orders:  No orders of the defined types were placed in this encounter.  No orders of the defined types were placed in this encounter.     Procedures: No procedures performed   Clinical Data: No additional findings.  Objective: Vital Signs: There were no vitals taken for this visit.  Physical Exam:   Constitutional: Patient appears well-developed HEENT:  Head: Normocephalic Eyes:EOM are normal Neck: Normal range of motion Cardiovascular: Normal rate Pulmonary/chest: Effort normal Neurologic: Patient is alert Skin: Skin is warm Psychiatric: Patient has normal mood and affect    Ortho Exam: Ortho exam demonstrates maintained symmetric passive external rotation of 15 degrees of abduction right versus left.  She has fairly reasonable strength but positive O'Brien's testing and radiation of the pain in the shoulder down the anterior aspect of her arm in the biceps region.  She has fairly minimal pain to direct palpation of the [facility] joint right and left.  She does have some coarseness and grinding around the anterolateral aspect of the acromial region with internal and external rotation.  [place]  strength is intact.  Infraspinatus supraspinatus strength slightly less on the right due to pain compared to the left.  Specialty Comments:  No specialty comments available.  Imaging: No results found.   PMFS History: Patient Active Problem List   Diagnosis Date Noted  . Dyspnea on exertion 10/21/2018  . Family history of early CAD 07/08/2018  . Left facial pressure and pain 06/25/2018  . Allergic rhinitis 06/25/2018  . Yeast vaginitis 06/25/2018  . Breast mass, right 05/25/2018  . Varicose veins of bilateral lower extremities with pain 06/09/2017  . Menopausal  symptoms 02/16/2015  . Vitamin B 12 deficiency 04/11/2014  . Stress incontinence 08/31/2013  . Cervico-occipital neuralgia 06/01/2013  . HLD (hyperlipidemia) 03/09/2012  . Migraine 12/31/2011  . Anxiety 12/31/2011  . Depression 12/31/2011  . Breast cancer ([place]) 12/31/2011   Past Medical History:  Diagnosis Date  . Anemia   . Anxiety   . Blood transfusion without reported diagnosis   . Breast cancer ([place]) 02/27/2007   Right  . GERD (gastroesophageal reflux disease)   . Hypercholesterolemia   . Migraine   . Personal history of radiation therapy 2008   Right  . Wears dentures    partial upper    Family History  Problem Relation Age of Onset  . Thyroid disease Mother   . Hypertension Mother   . Hyperlipidemia Mother   . Alzheimer's disease Mother   . Heart failure Mother   . Esophageal cancer Father   . Hyperlipidemia Father   . Heart murmur Father   . Colon polyps Father   . Diabetes Sister   . Alzheimer's disease Maternal Aunt   . Alzheimer's disease Maternal Uncle   . Diabetes Paternal Uncle   . Alzheimer's disease Maternal Grandmother   . Diabetes Paternal Grandmother   . Breast cancer Sister   . Irritable bowel syndrome Sister   . Ulcerative colitis Sister        ????  . Breast cancer Cousin   . Breast cancer Cousin   . Colon cancer Neg Hx     Past Surgical History:  Procedure Laterality Date  . ABDOMINAL HYSTERECTOMY    . BREAST BIOPSY Right 02/27/2007  . BREAST LUMPECTOMY Right 04/02/2007  . BREAST SURGERY  01/14/2013   breast reduction left side  . BROW LIFT Bilateral 06/17/2017   Procedure: BLEPHAROPLASTY UPPER EYELID WITH EXCESS SKIN;  Surgeon: Karle Starch, MD;  Location: [place];  Service: Ophthalmology;  Laterality: Bilateral;  . CESAREAN SECTION     two times  . COLONOSCOPY    . FOOT SURGERY Right   . PTOSIS REPAIR Bilateral 06/17/2017   Procedure: PTOSIS REPAIR RESECT EX;  Surgeon: Karle Starch, MD;  Location: [place];   Service: Ophthalmology;  Laterality: Bilateral;  . REDUCTION MAMMAPLASTY Left    Social History   Occupational History  . Not on file  Tobacco Use  . Smoking status: Never Smoker  . Smokeless tobacco: Never Used  Substance and Sexual Activity  . Alcohol use: No    Alcohol/week: 0.0 standard drinks  . Drug use: No  . Sexual activity: Yes    Partners: Male    Birth control/protection: Surgical

## 2018-12-02 ENCOUNTER — Other Ambulatory Visit: Payer: Self-pay | Admitting: Family Medicine

## 2018-12-02 MED ORDER — SUMATRIPTAN SUCCINATE 25 MG PO TABS: 25.0000 mg | ORAL_TABLET | ORAL | 0 refills | Status: DC | PRN

## 2018-12-08 ENCOUNTER — Encounter: Payer: Self-pay | Admitting: Family Medicine

## 2018-12-10 ENCOUNTER — Encounter: Payer: Self-pay | Admitting: Family Medicine

## 2018-12-14 ENCOUNTER — Encounter: Payer: Self-pay | Admitting: Family Medicine

## 2018-12-15 ENCOUNTER — Encounter: Payer: Self-pay | Admitting: Family Medicine

## 2018-12-15 ENCOUNTER — Other Ambulatory Visit: Payer: Self-pay

## 2018-12-15 ENCOUNTER — Telehealth: Payer: Self-pay

## 2018-12-15 ENCOUNTER — Ambulatory Visit (INDEPENDENT_AMBULATORY_CARE_PROVIDER_SITE_OTHER): Payer: PRIVATE HEALTH INSURANCE | Admitting: Family Medicine

## 2018-12-15 DIAGNOSIS — N952 Postmenopausal atrophic vaginitis: Secondary | ICD-10-CM | POA: Diagnosis not present

## 2018-12-15 DIAGNOSIS — N390 Urinary tract infection, site not specified: Secondary | ICD-10-CM | POA: Insufficient documentation

## 2018-12-15 DIAGNOSIS — N3000 Acute cystitis without hematuria: Secondary | ICD-10-CM

## 2018-12-15 MED ORDER — CEPHALEXIN 500 MG PO CAPS: 500.0000 mg | ORAL_CAPSULE | Freq: Two times a day (BID) | ORAL | 0 refills | Status: DC

## 2018-12-15 NOTE — Patient Instructions (Signed)
Great to see you.  Take Keflex as directed- 1 tablet twice daily for 7 days.  Try coconut oil during intercourse.  Send me a my chart message tomorrow.

## 2018-12-15 NOTE — Progress Notes (Signed)
Virtual Visit via Video   I connected with [name] on 12/15/18 at  8:40 AM EDT by a video enabled telemedicine application and verified that I am speaking with the correct person using two identifiers. Location patient: Home Location provider: [facility], Office Persons participating in the virtual visit: [name], Arnette Norris, MD   I discussed the limitations of evaluation and management by telemedicine and the availability of in person appointments. The patient expressed understanding and agreed to proceed.  Subjective:   HPI: dysuria- since 3.22.20.   It is usually brought on by intercourse. She has tried all types of lubricants but nothing helps much.  The day following intercourse last weekend, she experienced Sx of pain, frequency, pelvic pressure. Denies any back pain, nausea pain, vomiting, or fever.  She increased hydration, started using Cystex OTC after sex.  She also tried astroglide.   Sx still persist but are not nearly as bad. Describes as "A small burning and pressure and I have to go quick or I'll pee my pants."   ROS: See pertinent positives and negatives per HPI.  Review of Systems  Genitourinary: Positive for dyspareunia, dysuria and vaginal pain. Negative for decreased urine volume, difficulty urinating, enuresis, flank pain, frequency, genital sores, hematuria, menstrual problem, pelvic pain, urgency, vaginal bleeding and vaginal discharge.    Patient Active Problem List   Diagnosis Date Noted  . UTI (urinary tract infection) 12/15/2018  . Vaginal atrophy 12/15/2018  . Dyspnea on exertion 10/21/2018  . Family history of early CAD 07/08/2018  . Left facial pressure and pain 06/25/2018  . Allergic rhinitis 06/25/2018  . Yeast vaginitis 06/25/2018  . Breast mass, right 05/25/2018  . Varicose veins of bilateral lower extremities with pain 06/09/2017  . Menopausal symptoms 02/16/2015  . Vitamin B 12 deficiency 04/11/2014  . Stress  incontinence 08/31/2013  . Cervico-occipital neuralgia 06/01/2013  . HLD (hyperlipidemia) 03/09/2012  . Migraine 12/31/2011  . Anxiety 12/31/2011  . Depression 12/31/2011  . Breast cancer ([place]) 12/31/2011    Social History   Tobacco Use  . Smoking status: Never Smoker  . Smokeless tobacco: Never Used  Substance Use Topics  . Alcohol use: No    Alcohol/week: 0.0 standard drinks    Current Outpatient Medications:  .  Cyanocobalamin (B-12 PO), Take by mouth daily., Disp: , Rfl:  .  diclofenac (VOLTAREN) 75 MG EC tablet, TAKE 1 TABLET BY MOUTH TWICE A DAY, Disp: 60 tablet, Rfl: 1 .  escitalopram (LEXAPRO) 20 MG tablet, Take 1 tablet (20 mg total) by mouth daily., Disp: 90 tablet, Rfl: 1 .  fluticasone (FLONASE) 50 MCG/ACT nasal spray, Place 2 sprays into both nostrils daily., Disp: 16 g, Rfl: 6 .  ibuprofen (ADVIL,MOTRIN) 800 MG tablet, Take 1 tablet (800 mg total) by mouth every 8 (eight) hours as needed., Disp: 90 tablet, Rfl: 3 .  Multiple Vitamin (MULTIVITAMIN) tablet, Take 1 tablet by mouth daily., Disp: , Rfl:  .  omeprazole (PRILOSEC) 20 MG capsule, Take 1 capsule (20 mg total) by mouth daily. (Patient taking differently: Take 20 mg by mouth daily as needed (acid reflux). ), Disp: 90 capsule, Rfl: 2 .  promethazine (PHENERGAN) 25 MG tablet, Take 1 tablet (25 mg total) by mouth every 6 (six) hours as needed for nausea or vomiting., Disp: 30 tablet, Rfl: 1 .  SUMAtriptan (IMITREX) 25 MG tablet, Take 1 tablet (25 mg total) by mouth every 2 (two) hours as needed for migraine. May repeat in  2 hours if headache persists or recurs., Disp: 10 tablet, Rfl: 0 .  traMADol (ULTRAM) 50 MG tablet, TAKE 1 TABLET BY MOUTH EVERY 6 HOURS, Disp: 30 tablet, Rfl: 1 .  cephALEXin (KEFLEX) 500 MG capsule, Take 1 capsule (500 mg total) by mouth 2 (two) times daily., Disp: 14 capsule, Rfl: 0 .  Coenzyme Q10 (COQ10) 100 MG CAPS, Take 1 capsule by mouth as directed. Take over-the-counter dose recommended by  pharmacist. (Patient not taking: Reported on 12/15/2018), Disp: 30 each, Rfl:  .  rosuvastatin (CRESTOR) 5 MG tablet, Take 1 tablet (5 mg total) by mouth every Monday, Wednesday, and Friday., Disp: 45 tablet, Rfl: 2  Allergies  Allergen Reactions  . Statins Other (See Comments)    Muscle aches    Objective:  BP (!) 140/115 (BP Location: Left Arm, Patient Position: Sitting, Cuff Size: Normal)   Pulse 84   Ht 5' 4.5" (1.638 m)   Wt 170 lb (77.1 kg)   BMI 28.73 kg/m   VITALS: Per patient if applicable, see vitals. GENERAL: Alert, appears well and in no acute distress. HEENT: Atraumatic, conjunctiva clear, no obvious abnormalities on inspection of external nose and ears. NECK: Normal movements of the head and neck. CARDIOPULMONARY: No increased WOB. Speaking in clear sentences. I:E ratio WNL.  MS: Moves all visible extremities without noticeable abnormality. PSYCH: Pleasant and cooperative, well-groomed. Speech normal rate and rhythm. Affect is appropriate. Insight and judgement are appropriate. Attention is focused, linear, and appropriate.  NEURO: CN grossly intact. Oriented as arrived to appointment on time with no prompting. Moves both UE equally.  SKIN: No obvious lesions, wounds, erythema, or cyanosis noted on face or hands.  Assessment and Plan:   [name] was seen today for dysuria.  Diagnoses and all orders for this visit:  Acute cystitis without hematuria  Vaginal atrophy  Other orders -     cephALEXin (KEFLEX) 500 MG capsule; Take 1 capsule (500 mg total) by mouth 2 (two) times daily.    . Reviewed expectations re: course of current medical issues. . Discussed self-management of symptoms. . Outlined signs and symptoms indicating need for more acute intervention. . Patient verbalized understanding and all questions were answered. [name] Health Maintenance issues including appropriate healthy diet, exercise, and smoking avoidance were discussed with patient. . See orders for  this visit as documented in the electronic medical record.  Arnette Norris, MD 12/15/2018

## 2018-12-15 NOTE — Assessment & Plan Note (Signed)
Post coital UTI.  First she has had had in sometime.   Will treat with Keflex 500 mg twice daily x 7 days.  She will update me tomorrow. Also advised coconut oil instead of astroglide for sexual lubricant. The patient indicates understanding of these issues and agrees with the plan.

## 2018-12-15 NOTE — Telephone Encounter (Signed)
PA for Sumatriptan #9 per 30d initiated via CMM/thx dmf

## 2018-12-16 ENCOUNTER — Inpatient Hospital Stay (INDEPENDENT_AMBULATORY_CARE_PROVIDER_SITE_OTHER): Payer: PRIVATE HEALTH INSURANCE | Admitting: Orthopedic Surgery

## 2018-12-18 ENCOUNTER — Telehealth (INDEPENDENT_AMBULATORY_CARE_PROVIDER_SITE_OTHER): Payer: Self-pay

## 2018-12-18 MED ORDER — TRAMADOL HCL 50 MG PO TABS: ORAL_TABLET | ORAL | 1 refills | Status: DC

## 2018-12-18 NOTE — Telephone Encounter (Signed)
Patient's shoulder surgery was cancelled due to COVID 19.  Can something be called in to help manage the pain in the meantime?  Patient # [phone]  Using [facility] [phone].

## 2018-12-18 NOTE — Telephone Encounter (Signed)
Please advise. Thanks.  

## 2018-12-18 NOTE — Telephone Encounter (Signed)
Called to pharmacy 

## 2018-12-18 NOTE — Telephone Encounter (Signed)
Okay for tramadol 1 p.o. every 8 hours as needed pain #35 thanks

## 2019-01-11 ENCOUNTER — Encounter: Payer: Self-pay | Admitting: Family Medicine

## 2019-01-13 ENCOUNTER — Ambulatory Visit (INDEPENDENT_AMBULATORY_CARE_PROVIDER_SITE_OTHER): Payer: PRIVATE HEALTH INSURANCE | Admitting: Family Medicine

## 2019-01-13 ENCOUNTER — Encounter: Payer: Self-pay | Admitting: Family Medicine

## 2019-01-13 DIAGNOSIS — C50111 Malignant neoplasm of central portion of right female breast: Secondary | ICD-10-CM

## 2019-01-13 DIAGNOSIS — R030 Elevated blood-pressure reading, without diagnosis of hypertension: Secondary | ICD-10-CM | POA: Diagnosis not present

## 2019-01-13 DIAGNOSIS — J0110 Acute frontal sinusitis, unspecified: Secondary | ICD-10-CM

## 2019-01-13 DIAGNOSIS — F39 Unspecified mood [affective] disorder: Secondary | ICD-10-CM

## 2019-01-13 MED ORDER — AMOXICILLIN-POT CLAVULANATE 875-125 MG PO TABS: 1.0000 | ORAL_TABLET | Freq: Two times a day (BID) | ORAL | 0 refills | Status: AC

## 2019-01-13 MED ORDER — PREDNISONE 10 MG PO TABS: ORAL_TABLET | ORAL | 0 refills | Status: DC

## 2019-01-13 NOTE — Assessment & Plan Note (Signed)
New- likely multifactorial- ADVISED TO STOP NSAIDS and SUDAFED as this can worsen blood pressure. She is also having a lot of sinus pain and pain often makes her BP go up. She will update if her blood pressure does not come down or if she develops any new symptoms.

## 2019-01-13 NOTE — Assessment & Plan Note (Addendum)
Given duration and progression of symptoms, will treat for bacterial sinusitis treat with Augmentin twice daily x 10 days. Restart floase.  Stop flonase due to elevated blood pressure. Also will send in prednisone dose pack- advised to stop Ibuprofen (all NSAIDS while taking this).  Okay to take Tylenol. Call or send my chart message prn if these symptoms worsen or fail to improve as anticipated.

## 2019-01-13 NOTE — Progress Notes (Signed)
Virtual Visit via Video   Due to the COVID-19 pandemic, this visit was completed with telemedicine (audio/video) technology to reduce patient and provider exposure as well as to preserve personal protective equipment.   I connected with [name] on 01/13/19 at 10:40 AM EDT by a video enabled telemedicine application and verified that I am speaking with the correct person using two identifiers. Location patient: Home Location provider:  [facility], Office Persons participating in the virtual visit: [name], Arnette Norris, MD   I discussed the limitations of evaluation and management by telemedicine and the availability of in person appointments. The patient expressed understanding and agreed to proceed.  Care Team   Patient Care Team: Lucille Passy, MD as PCP - General (Family Medicine)  Subjective:   HPI:   SUBJECTIVE:  [name] is a 63 y.o. female who complains of coryza, congestion and frontal sinus pain, eye pressure for 8 days. She denies a history of anorexia, chest pain, chills, dizziness, fatigue, fevers and shortness of breath and denies a history of asthma. Patient denies smoke cigarettes.   She has been taking sudafed, zyrtec, Ibuprofen and claritin without much relief in symptoms.  BP is very elevated today.  She is complaining of a headache but no other focal neurological symptoms.  She does not take rx for HTN.  She actually thinks she had the headache first and her blood pressure goes up when she is in pain.  BP Readings from Last 3 Encounters:  01/13/19 (!) 160/100  12/15/18 (!) 140/115  10/21/18 138/80     Review of Systems  Constitutional: Negative for chills and fever.  HENT: Positive for congestion and sinus pain. Negative for ear discharge, ear pain, hearing loss, nosebleeds, sore throat and tinnitus.   Eyes: Negative.   Respiratory: Positive for cough. Negative for hemoptysis, sputum production, shortness of breath, wheezing and stridor.    Cardiovascular: Negative for chest pain.  Gastrointestinal: Negative.   Musculoskeletal: Negative.   Skin: Negative.   Neurological: Positive for headaches. Negative for dizziness, tingling, tremors, sensory change, speech change, focal weakness, seizures, loss of consciousness and weakness.  Endo/Heme/Allergies: Negative.   Psychiatric/Behavioral: Negative.   All other systems reviewed and are negative.    Patient Active Problem List   Diagnosis Date Noted  . Acute empyema of frontal sinus 01/13/2019  . Elevated blood pressure reading without diagnosis of hypertension 01/13/2019  . Vaginal atrophy 12/15/2018  . Dyspnea on exertion 10/21/2018  . Family history of early CAD 07/08/2018  . Allergic rhinitis 06/25/2018  . Breast mass, right 05/25/2018  . Varicose veins of bilateral lower extremities with pain 06/09/2017  . Menopausal symptoms 02/16/2015  . Vitamin B 12 deficiency 04/11/2014  . Stress incontinence 08/31/2013  . Cervico-occipital neuralgia 06/01/2013  . HLD (hyperlipidemia) 03/09/2012  . Migraine 12/31/2011  . Anxiety 12/31/2011  . Unspecified mood (affective) disorder ([place]) 12/31/2011  . Breast cancer ([place]) 12/31/2011    Social History   Tobacco Use  . Smoking status: Never Smoker  . Smokeless tobacco: Never Used  Substance Use Topics  . Alcohol use: No    Alcohol/week: 0.0 standard drinks    Current Outpatient Medications:  .  Cyanocobalamin (B-12 PO), Take by mouth daily., Disp: , Rfl:  .  diclofenac (VOLTAREN) 75 MG EC tablet, TAKE 1 TABLET BY MOUTH TWICE A DAY, Disp: 60 tablet, Rfl: 1 .  escitalopram (LEXAPRO) 20 MG tablet, Take 1 tablet (20 mg total) by mouth daily., Disp:  90 tablet, Rfl: 1 .  fluticasone (FLONASE) 50 MCG/ACT nasal spray, Place 2 sprays into both nostrils daily., Disp: 16 g, Rfl: 6 .  ibuprofen (ADVIL,MOTRIN) 800 MG tablet, Take 1 tablet (800 mg total) by mouth every 8 (eight) hours as needed., Disp: 90 tablet, Rfl: 3 .  Multiple  Vitamin (MULTIVITAMIN) tablet, Take 1 tablet by mouth daily., Disp: , Rfl:  .  omeprazole (PRILOSEC) 20 MG capsule, Take 1 capsule (20 mg total) by mouth daily. (Patient taking differently: Take 20 mg by mouth daily as needed (acid reflux). ), Disp: 90 capsule, Rfl: 2 .  promethazine (PHENERGAN) 25 MG tablet, Take 1 tablet (25 mg total) by mouth every 6 (six) hours as needed for nausea or vomiting., Disp: 30 tablet, Rfl: 1 .  SUMAtriptan (IMITREX) 25 MG tablet, Take 1 tablet (25 mg total) by mouth every 2 (two) hours as needed for migraine. May repeat in 2 hours if headache persists or recurs., Disp: 10 tablet, Rfl: 0 .  traMADol (ULTRAM) 50 MG tablet, TAKE 1 TABLET BY MOUTH EVERY 8 HOURS, Disp: 30 tablet, Rfl: 1 .  Coenzyme Q10 (COQ10) 100 MG CAPS, Take 1 capsule by mouth as directed. Take over-the-counter dose recommended by pharmacist. (Patient not taking: Reported on 12/15/2018), Disp: 30 each, Rfl:  .  rosuvastatin (CRESTOR) 5 MG tablet, Take 1 tablet (5 mg total) by mouth every Monday, Wednesday, and Friday., Disp: 45 tablet, Rfl: 2  Allergies  Allergen Reactions  . Statins Other (See Comments)    Muscle aches    Objective:  BP (!) 160/100 Comment: pt reported- in pain/headache  Pulse 88   Ht 5' 4.5" (1.638 m)   BMI 28.73 kg/m  BP Readings from Last 3 Encounters:  01/13/19 (!) 160/100  12/15/18 (!) 140/115  10/21/18 138/80     VITALS: Per patient if applicable, see vitals. GENERAL: Alert, appears well and in no acute distress. HEENT: Atraumatic, conjunctiva clear, no obvious abnormalities on inspection of external nose and ears. NECK: Normal movements of the head and neck. CARDIOPULMONARY: No increased WOB. Speaking in clear sentences. I:E ratio WNL.  MS: Moves all visible extremities without noticeable abnormality. PSYCH: Pleasant and cooperative, well-groomed. Speech normal rate and rhythm. Affect is appropriate. Insight and judgement are appropriate. Attention is focused,  linear, and appropriate.  NEURO: CN grossly intact. Oriented as arrived to appointment on time with no prompting. Moves both UE equally.  SKIN: No obvious lesions, wounds, erythema, or cyanosis noted on face or hands.  Depression screen [facility] 2/9 12/15/2018 05/25/2018 05/20/2017  Decreased Interest 0 0 0  Down, Depressed, Hopeless 0 0 0  PHQ - 2 Score 0 0 0    Assessment and Plan:   [name] was seen today for pressure behind the eyes.  Diagnoses and all orders for this visit:  Unspecified mood (affective) disorder ([place])  Malignant neoplasm of central portion of right female breast, unspecified estrogen receptor status ([place])  Acute empyema of frontal sinus  Elevated blood pressure reading without diagnosis of hypertension    . COVID-19 Education:The signs and symptoms of COVID-19 were discussed with the patient and how to seek care for testing if needed. The importance of social distancing was discussed today. . Reviewed expectations re: course of current medical issues. . Discussed self-management of symptoms. . Outlined signs and symptoms indicating need for more acute intervention. . Patient verbalized understanding and all questions were answered. [name] Health Maintenance issues including appropriate healthy diet, exercise, and smoking avoidance were discussed  with patient. . See orders for this visit as documented in the electronic medical record.  Arnette Norris, MD 01/13/2019  Records requested if needed. Time spent: 25 minutes, of which >50% was spent in obtaining information about her symptoms, reviewing her previous labs, evaluations, and treatments, counseling her about her condition (please see the discussed topics above), and developing a plan to further investigate it; she had a number of questions which I addressed.

## 2019-01-15 ENCOUNTER — Encounter: Payer: Self-pay | Admitting: Family Medicine

## 2019-01-16 ENCOUNTER — Other Ambulatory Visit (INDEPENDENT_AMBULATORY_CARE_PROVIDER_SITE_OTHER): Payer: Self-pay | Admitting: Orthopedic Surgery

## 2019-01-17 NOTE — Progress Notes (Signed)
Virtual Visit via Video   Due to the COVID-19 pandemic, this visit was completed with telemedicine (audio/video) technology to reduce patient and provider exposure as well as to preserve personal protective equipment.   I connected with [name] by a video enabled telemedicine application and verified that I am speaking with the correct person using two identifiers. Location patient: Home Location provider:  [facility], Office Persons participating in the virtual visit: [name], Arnette Norris, MD   I discussed the limitations of evaluation and management by telemedicine and the availability of in person appointments. The patient expressed understanding and agreed to proceed.  Care Team   Patient Care Team: Lucille Passy, MD as PCP - General (Family Medicine)  Subjective:   HPI:  Saw pt on 4/29 for URI symptoms- on 4.29 and her BP at visit was 160/100. Was Rx'ed Augmentin and Prednisone Taper. Received the following mychart message from patient on 01/15/19 ( 3 days ago) as a follow up from her 01/13/19 visit for URI symptoms:  Dr Deborra Medina ,               My sinus is a lot better fell great except my blood pressure April 29th 199/118 I talked with you that day April 30th 156/92               Today may 1st 182/105  You told me to give you an update Let me know if you need to cal me are tell me something I can do to                     make come down. Have a great week-end                                                                 [name]   I advised her not to take any sudafed or any antihistamines with decongestants (ie Zyrtec D), to verify that her BP cuff was accurate and to follow up with me today.  BP Readings from Last 3 Encounters:  01/18/19 (!) 145/80  01/13/19 (!) 160/100  12/15/18 (!) 140/115   She bought a new BP cuff, not taking any  decongestants and last dose of prednisone is tomorrow. BP this am was 145/80; last night, her BP was 156/81; Sat 178/93. Every day it has been coming down some.   Review of Systems  Constitutional: Negative.   HENT: Negative.   Eyes: Negative.   Respiratory: Negative.   Cardiovascular: Negative.   Gastrointestinal: Negative.   Genitourinary: Negative.   Musculoskeletal: Negative.   Skin: Negative.   Endo/Heme/Allergies: Negative.   Psychiatric/Behavioral: Negative.   All other systems reviewed and are negative.    Patient Active Problem List   Diagnosis Date Noted   Elevated blood pressure reading without diagnosis of hypertension 01/13/2019   Vaginal atrophy 12/15/2018   Dyspnea on exertion 10/21/2018   Family history of early CAD 07/08/2018   Allergic rhinitis 06/25/2018   Breast mass, right 05/25/2018   Varicose veins of bilateral lower extremities with pain 06/09/2017   Menopausal symptoms 02/16/2015   Vitamin B 12 deficiency 04/11/2014   Stress incontinence 08/31/2013   Cervico-occipital neuralgia 06/01/2013   Acute sinus infection 03/30/2013  HLD (hyperlipidemia) 03/09/2012   Migraine 12/31/2011   Anxiety 12/31/2011   Unspecified mood (affective) disorder ([place]) 12/31/2011   Breast cancer ([place]) 12/31/2011    Social History   Tobacco Use   Smoking status: Never Smoker   Smokeless tobacco: Never Used  Substance Use Topics   Alcohol use: No    Alcohol/week: 0.0 standard drinks    Current Outpatient Medications:    amoxicillin-clavulanate (AUGMENTIN) 875-125 MG tablet, Take 1 tablet by mouth 2 (two) times daily for 10 days., Disp: 20 tablet, Rfl: 0   Cyanocobalamin (B-12 PO), Take by mouth daily., Disp: , Rfl:    diclofenac (VOLTAREN) 75 MG EC tablet, TAKE 1 TABLET BY MOUTH TWICE A DAY, Disp: 60 tablet, Rfl: 1   escitalopram (LEXAPRO) 20 MG tablet, Take 1 tablet (20 mg total) by mouth daily., Disp: 90 tablet, Rfl: 1   fluticasone  (FLONASE) 50 MCG/ACT nasal spray, Place 2 sprays into both nostrils daily., Disp: 16 g, Rfl: 6   ibuprofen (ADVIL,MOTRIN) 800 MG tablet, Take 1 tablet (800 mg total) by mouth every 8 (eight) hours as needed., Disp: 90 tablet, Rfl: 3   Multiple Vitamin (MULTIVITAMIN) tablet, Take 1 tablet by mouth daily., Disp: , Rfl:    omeprazole (PRILOSEC) 20 MG capsule, Take 1 capsule (20 mg total) by mouth daily. (Patient taking differently: Take 20 mg by mouth daily as needed (acid reflux). ), Disp: 90 capsule, Rfl: 2   predniSONE (DELTASONE) 10 MG tablet, 3 tabs by mouth x 3 days, 2 tabs by mouth x 2 days, 1 tab by mouth x 2 days and stop., Disp: 15 tablet, Rfl: 0   promethazine (PHENERGAN) 25 MG tablet, Take 1 tablet (25 mg total) by mouth every 6 (six) hours as needed for nausea or vomiting., Disp: 30 tablet, Rfl: 1   SUMAtriptan (IMITREX) 25 MG tablet, Take 1 tablet (25 mg total) by mouth every 2 (two) hours as needed for migraine. May repeat in 2 hours if headache persists or recurs., Disp: 10 tablet, Rfl: 0   traMADol (ULTRAM) 50 MG tablet, TAKE 1 TABLET BY MOUTH EVERY 8 HOURS, Disp: 30 tablet, Rfl: 1   Coenzyme Q10 (COQ10) 100 MG CAPS, Take 1 capsule by mouth as directed. Take over-the-counter dose recommended by pharmacist. (Patient not taking: Reported on 12/15/2018), Disp: 30 each, Rfl:    rosuvastatin (CRESTOR) 5 MG tablet, Take 1 tablet (5 mg total) by mouth every Monday, Wednesday, and Friday., Disp: 45 tablet, Rfl: 2  Allergies  Allergen Reactions   Statins Other (See Comments)    Muscle aches    Objective:  BP (!) 145/80 (BP Location: Left Arm, Patient Position: Sitting, Cuff Size: Normal)    Pulse 73   VITALS: Per patient if applicable, see vitals. GENERAL: Alert, appears well and in no acute distress. HEENT: Atraumatic, conjunctiva clear, no obvious abnormalities on inspection of external nose and ears. NECK: Normal movements of the head and neck. CARDIOPULMONARY: No increased  WOB. Speaking in clear sentences. I:E ratio WNL.  MS: Moves all visible extremities without noticeable abnormality. PSYCH: Pleasant and cooperative, well-groomed. Speech normal rate and rhythm. Affect is appropriate. Insight and judgement are appropriate. Attention is focused, linear, and appropriate.  NEURO: CN grossly intact. Oriented as arrived to appointment on time with no prompting. Moves both UE equally.  SKIN: No obvious lesions, wounds, erythema, or cyanosis noted on face or hands.  Depression screen [facility] 2/9 12/15/2018 05/25/2018 05/20/2017  Decreased Interest 0 0 0  Down, Depressed,  Hopeless 0 0 0  PHQ - 2 Score 0 0 0    Assessment and Plan:   [name] was seen today for hypertension.  Diagnoses and all orders for this visit:  Acute frontal sinusitis, recurrence not specified  Elevated blood pressure reading without diagnosis of hypertension     COVID-19 Education: The signs and symptoms of COVID-19 were discussed with the patient and how to seek care for testing if needed. The importance of social distancing was discussed today.  Reviewed expectations re: course of current medical issues.  Discussed self-management of symptoms.  Outlined signs and symptoms indicating need for more acute intervention.  Patient verbalized understanding and all questions were answered.  Health Maintenance issues including appropriate healthy diet, exercise, and smoking avoidance were discussed with patient.  See orders for this visit as documented in the electronic medical record.  Arnette Norris, MD  Records requested if needed. Time spent: 25 minutes, of which >50% was spent in obtaining information about her symptoms, reviewing her previous labs, evaluations, and treatments, counseling her about her condition (please see the discussed topics above), and developing a plan to further investigate it; she had a number of questions which I addressed.

## 2019-01-18 ENCOUNTER — Telehealth: Payer: Self-pay

## 2019-01-18 ENCOUNTER — Encounter: Payer: Self-pay | Admitting: Family Medicine

## 2019-01-18 ENCOUNTER — Ambulatory Visit (INDEPENDENT_AMBULATORY_CARE_PROVIDER_SITE_OTHER): Payer: PRIVATE HEALTH INSURANCE | Admitting: Family Medicine

## 2019-01-18 ENCOUNTER — Other Ambulatory Visit: Payer: Self-pay | Admitting: Family Medicine

## 2019-01-18 VITALS — BP 145/80 | HR 73

## 2019-01-18 DIAGNOSIS — J0110 Acute frontal sinusitis, unspecified: Secondary | ICD-10-CM | POA: Diagnosis not present

## 2019-01-18 DIAGNOSIS — R030 Elevated blood-pressure reading, without diagnosis of hypertension: Secondary | ICD-10-CM

## 2019-01-18 NOTE — Assessment & Plan Note (Signed)
Symptoms improving- last dose of prednisone today.  Call or send my chart message prn if these symptoms worsen or fail to improve as anticipated. >25 minutes spent in face to face time with patient, >50% spent in counselling or coordination of care

## 2019-01-18 NOTE — Telephone Encounter (Signed)
LMOVM for pt to RTC/need to go through check I process prior to appointment with Dr. Deborra Medina at 8:40am/thx dmf

## 2019-01-18 NOTE — Telephone Encounter (Signed)
TA-LOV: 5.4.20/LF: 4.3.2020/per [place] PMP pt is compliant no red flags/thx dmf

## 2019-01-18 NOTE — Telephone Encounter (Signed)
Ok to rf? 

## 2019-01-18 NOTE — Telephone Encounter (Signed)
y

## 2019-01-18 NOTE — Assessment & Plan Note (Signed)
Blood pressure is slowly coming down- was likely multifactorial- prednisone and sudafed likely played a big role.  She asymptomatic. Call or send my chart message prn if these symptoms worsen or fail to improve as anticipated. The patient indicates understanding of these issues and agrees with the plan.

## 2019-01-22 ENCOUNTER — Encounter: Payer: Self-pay | Admitting: Family Medicine

## 2019-01-24 ENCOUNTER — Encounter: Payer: Self-pay | Admitting: Family Medicine

## 2019-01-28 ENCOUNTER — Ambulatory Visit (INDEPENDENT_AMBULATORY_CARE_PROVIDER_SITE_OTHER): Payer: PRIVATE HEALTH INSURANCE | Admitting: Family Medicine

## 2019-01-28 ENCOUNTER — Other Ambulatory Visit: Payer: Self-pay

## 2019-01-28 ENCOUNTER — Encounter: Payer: Self-pay | Admitting: Family Medicine

## 2019-01-28 ENCOUNTER — Encounter (HOSPITAL_BASED_OUTPATIENT_CLINIC_OR_DEPARTMENT_OTHER): Payer: Self-pay | Admitting: *Deleted

## 2019-01-28 DIAGNOSIS — J0110 Acute frontal sinusitis, unspecified: Secondary | ICD-10-CM | POA: Diagnosis not present

## 2019-01-28 MED ORDER — CETIRIZINE-PSEUDOEPHEDRINE ER 5-120 MG PO TB12: 1.0000 | ORAL_TABLET | Freq: Two times a day (BID) | ORAL | 0 refills | Status: DC

## 2019-01-28 NOTE — Assessment & Plan Note (Signed)
>  25 minutes spent in face to face time with patient, >50% spent in counselling or coordination of care.  We agreed that prednisone prior to surgery is not a good idea- can increase bleeding risk, delay healing.   I sent in rx for zyrtec D for her to take the next couple of days but to watch her BP carefully.  Continue flonase. Call or send my chart message prn if these symptoms worsen or fail to improve as anticipated. The patient indicates understanding of these issues and agrees with the plan.

## 2019-01-28 NOTE — Progress Notes (Signed)
Virtual Visit via Video   Due to the COVID-19 pandemic, this visit was completed with telemedicine (audio/video) technology to reduce patient and provider exposure as well as to preserve personal protective equipment.   I connected with [name] by a video enabled telemedicine application and verified that I am speaking with the correct person using two identifiers. Location patient: Home Location provider: [facility], Office Persons participating in the virtual visit: [name], Arnette Norris, MD   I discussed the limitations of evaluation and management by telemedicine and the availability of in person appointments. The patient expressed understanding and agreed to proceed.  Care Team   Patient Care Team: Lucille Passy, MD as PCP - General (Family Medicine)  Subjective:   HPI:   Last saw pt for virtual visit on 01/18/19.  Note reviewed.  Initially saw her on 4/29 for URI symptoms-   on 4.29 and her BP at visit was 160/100. Was Rx'ed Augmentin and Prednisone Taper. Received the following mychart message from patient on 01/15/19 ( 3 days ago) as a follow up from her 01/13/19 visit for URI symptoms:  Dr Deborra Medina ,               My sinus is a lot better fell great except my blood pressure April 29th 199/118 I talked with you that day April 30th 156/92               Today may 1st 182/105  You told me to give you an update Let me know if you need to cal me are tell me something I can do to                     make come down. Have a great week-end                                                                 [name]   I advised her not to take any sudafed or any antihistamines with decongestants (ie Zyrtec D), to verify that her BP cuff was accurate and to follow up with me on 01/18/19.  BP Readings from Last 3 Encounters:  01/18/19 (!) 145/80  01/13/19 (!)  160/100  12/15/18 (!) 140/115   On 5/4, BP had improved and she was feeling much better.  She was finishing her last dose of prednisone that day.  She then sent the following message on 01/22/19:   Hi Dr. Joaquim Nam all my Meds. I have started feeling like I did last week pressure on eyes stuffy nose again just like I did before.                    I don't know if I just need more prednisone or even if I can take more it really did help.Let me know. I am having surgery on my shoulder May the 18th.   She sent a message on 5/10, stating the following:  Happy Mother's Day!!!!                       Dr. Deborra Medina , I  am feeling better started taking Zyrtec It is helping a lot , sorry for the earlier message all is well, Thank You!!!                                                                       [name] 1956/02/07   [phone]   She said she sent the message saying she felt better because she started taking a zyrtec and felt better. She finished her abx as well. Now, she is starting to have pain again in her periorbital pressure but it is not as bad as it was prior to taking prednisone and Augmentin.  Review of Systems  Constitutional: Negative.   HENT: Positive for congestion and sinus pain. Negative for ear discharge, ear pain, hearing loss, nosebleeds, sore throat and tinnitus.   Eyes: Negative.   Respiratory: Negative.  Negative for stridor.   Cardiovascular: Negative.   Gastrointestinal: Negative.   Genitourinary: Negative.   Musculoskeletal: Negative.   Skin: Negative.   Neurological: Negative.   Endo/Heme/Allergies: Negative.   Psychiatric/Behavioral: Negative.   All other systems reviewed and are negative.    Patient Active Problem List   Diagnosis Date Noted  . Elevated blood pressure reading without diagnosis of  hypertension 01/13/2019  . Vaginal atrophy 12/15/2018  . Dyspnea on exertion 10/21/2018  . Family history of early CAD 07/08/2018  . Allergic rhinitis 06/25/2018  . Breast mass, right 05/25/2018  . Varicose veins of bilateral lower extremities with pain 06/09/2017  . Menopausal symptoms 02/16/2015  . Vitamin B 12 deficiency 04/11/2014  . Stress incontinence 08/31/2013  . Cervico-occipital neuralgia 06/01/2013  . Acute sinus infection 03/30/2013  . HLD (hyperlipidemia) 03/09/2012  . Migraine 12/31/2011  . Anxiety 12/31/2011  . Unspecified mood (affective) disorder ([place]) 12/31/2011  . Breast cancer ([place]) 12/31/2011    Social History   Tobacco Use  . Smoking status: Never Smoker  . Smokeless tobacco: Never Used  Substance Use Topics  . Alcohol use: No    Alcohol/week: 0.0 standard drinks    Current Outpatient Medications:  .  Coenzyme Q10 (COQ10) 100 MG CAPS, Take 1 capsule by mouth as directed. Take over-the-counter dose recommended by pharmacist. (Patient not taking: Reported on 12/15/2018), Disp: 30 each, Rfl:  .  Cyanocobalamin (B-12 PO), Take by mouth daily., Disp: , Rfl:  .  diclofenac (VOLTAREN) 75 MG EC tablet, TAKE 1 TABLET BY MOUTH TWICE A DAY, Disp: 60 tablet, Rfl: 1 .  escitalopram (LEXAPRO) 20 MG tablet, Take 1 tablet (20 mg total) by mouth daily., Disp: 90 tablet, Rfl: 1 .  fluticasone (FLONASE) 50 MCG/ACT nasal spray, Place 2 sprays into both nostrils daily., Disp: 16 g, Rfl: 6 .  ibuprofen (ADVIL,MOTRIN) 800 MG tablet, Take 1 tablet (800 mg total) by mouth every 8 (eight) hours as needed., Disp: 90 tablet, Rfl: 3 .  Multiple Vitamin (MULTIVITAMIN) tablet, Take 1 tablet by mouth daily., Disp: , Rfl:  .  omeprazole (PRILOSEC) 20 MG capsule, Take 1 capsule (20 mg total) by mouth daily. (Patient taking differently: Take 20 mg by mouth daily as needed (acid reflux). ), Disp: 90 capsule, Rfl: 2 .  predniSONE (DELTASONE) 10 MG tablet, 3 tabs by mouth x 3 days, 2 tabs by  mouth x 2 days, 1 tab by mouth x 2 days and stop., Disp: 15 tablet, Rfl: 0 .  promethazine (PHENERGAN) 25 MG tablet, Take 1 tablet (25 mg total) by mouth every 6 (six) hours as needed for nausea or vomiting., Disp: 30 tablet, Rfl: 1 .  rosuvastatin (CRESTOR) 5 MG tablet, Take 1 tablet (5 mg total) by mouth every Monday, Wednesday, and Friday., Disp: 45 tablet, Rfl: 2 .  SUMAtriptan (IMITREX) 25 MG tablet, Take 1 tablet (25 mg total) by mouth every 2 (two) hours as needed for migraine. May repeat in 2 hours if headache persists or recurs., Disp: 10 tablet, Rfl: 0 .  traMADol (ULTRAM) 50 MG tablet, TAKE 1 TABLET BY MOUTH EVERY 12 HOURS AS NEEDED FOR PAIN, Disp: 30 tablet, Rfl: 0 .  traMADol (ULTRAM) 50 MG tablet, TAKE 1 TABLET BY MOUTH EVERY 6 HOURS, Disp: 30 tablet, Rfl: 5  Allergies  Allergen Reactions  . Statins Other (See Comments)    Muscle aches    Objective:  Temp (!) 97.5 F (36.4 C) (Oral)   VITALS: Per patient if applicable, see vitals.  She did not have her BP cuff today but has been checking it and has been normotensive. GENERAL: Alert, appears well and in no acute distress. HEENT: Atraumatic, conjunctiva clear, no obvious abnormalities on inspection of external nose and ears.  When patient palpates under eyes bilaterally it is tender but not tender anywhere else. NECK: Normal movements of the head and neck. CARDIOPULMONARY: No increased WOB. Speaking in clear sentences. I:E ratio WNL.  MS: Moves all visible extremities without noticeable abnormality. PSYCH: Pleasant and cooperative, well-groomed. Speech normal rate and rhythm. Affect is appropriate. Insight and judgement are appropriate. Attention is focused, linear, and appropriate.  NEURO: CN grossly intact. Oriented as arrived to appointment on time with no prompting. Moves both UE equally.  SKIN: No obvious lesions, wounds, erythema, or cyanosis noted on face or hands.  Depression screen [facility] 2/9 12/15/2018 05/25/2018 05/20/2017   Decreased Interest 0 0 0  Down, Depressed, Hopeless 0 0 0  PHQ - 2 Score 0 0 0    Assessment and Plan:   There are no diagnoses linked to this encounter.  [name] COVID-19 Education: The signs and symptoms of COVID-19 were discussed with the patient and how to seek care for testing if needed. The importance of social distancing was discussed today. . Reviewed expectations re: course of current medical issues. . Discussed self-management of symptoms. . Outlined signs and symptoms indicating need for more acute intervention. . Patient verbalized understanding and all questions were answered. [name] Health Maintenance issues including appropriate healthy diet, exercise, and smoking avoidance were discussed with patient. . See orders for this visit as documented in the electronic medical record.  Arnette Norris, MD  Records requested if needed. Time spent: 25 minutes, of which >50% was spent in obtaining information about her symptoms, reviewing her previous labs, evaluations, and treatments, counseling her about her condition (please see the discussed topics above), and developing a plan to further investigate it; she had a number of questions which I addressed.

## 2019-01-29 ENCOUNTER — Other Ambulatory Visit
Admission: RE | Admit: 2019-01-29 | Discharge: 2019-01-29 | Disposition: A | Discharge status: Home / Self Care | Payer: PRIVATE HEALTH INSURANCE | Source: Ambulatory Visit | Attending: Orthopedic Surgery | Admitting: Orthopedic Surgery

## 2019-01-29 ENCOUNTER — Other Ambulatory Visit (HOSPITAL_COMMUNITY): Payer: PRIVATE HEALTH INSURANCE

## 2019-01-29 DIAGNOSIS — Z1159 Encounter for screening for other viral diseases: Secondary | ICD-10-CM | POA: Diagnosis not present

## 2019-01-31 LAB — NOVEL CORONAVIRUS, NAA (HOSP ORDER, SEND-OUT TO REF LAB; TAT 18-24 HRS): SARS-CoV-2, NAA: NOT DETECTED

## 2019-02-01 ENCOUNTER — Encounter (HOSPITAL_BASED_OUTPATIENT_CLINIC_OR_DEPARTMENT_OTHER): Payer: Self-pay | Admitting: Anesthesiology

## 2019-02-01 ENCOUNTER — Encounter (HOSPITAL_BASED_OUTPATIENT_CLINIC_OR_DEPARTMENT_OTHER)
Admission: RE | Disposition: A | Discharge status: Home / Self Care | Payer: Self-pay | Source: Home / Self Care | Attending: Orthopedic Surgery

## 2019-02-01 ENCOUNTER — Ambulatory Visit (HOSPITAL_BASED_OUTPATIENT_CLINIC_OR_DEPARTMENT_OTHER): Payer: PRIVATE HEALTH INSURANCE | Admitting: Anesthesiology

## 2019-02-01 ENCOUNTER — Other Ambulatory Visit: Payer: Self-pay

## 2019-02-01 ENCOUNTER — Ambulatory Visit (HOSPITAL_BASED_OUTPATIENT_CLINIC_OR_DEPARTMENT_OTHER)
Admission: RE | Admit: 2019-02-01 | Discharge: 2019-02-01 | Disposition: A | Discharge status: Home / Self Care | Payer: PRIVATE HEALTH INSURANCE | Attending: Orthopedic Surgery | Admitting: Orthopedic Surgery

## 2019-02-01 DIAGNOSIS — M75101 Unspecified rotator cuff tear or rupture of right shoulder, not specified as traumatic: Secondary | ICD-10-CM | POA: Insufficient documentation

## 2019-02-01 DIAGNOSIS — Z791 Long term (current) use of non-steroidal anti-inflammatories (NSAID): Secondary | ICD-10-CM | POA: Diagnosis not present

## 2019-02-01 DIAGNOSIS — M7521 Bicipital tendinitis, right shoulder: Secondary | ICD-10-CM | POA: Diagnosis not present

## 2019-02-01 DIAGNOSIS — Z1159 Encounter for screening for other viral diseases: Secondary | ICD-10-CM | POA: Diagnosis not present

## 2019-02-01 DIAGNOSIS — M1990 Unspecified osteoarthritis, unspecified site: Secondary | ICD-10-CM | POA: Diagnosis not present

## 2019-02-01 DIAGNOSIS — M75121 Complete rotator cuff tear or rupture of right shoulder, not specified as traumatic: Secondary | ICD-10-CM

## 2019-02-01 DIAGNOSIS — Z79891 Long term (current) use of opiate analgesic: Secondary | ICD-10-CM | POA: Diagnosis not present

## 2019-02-01 DIAGNOSIS — E7800 Pure hypercholesterolemia, unspecified: Secondary | ICD-10-CM | POA: Insufficient documentation

## 2019-02-01 DIAGNOSIS — Z888 Allergy status to other drugs, medicaments and biological substances status: Secondary | ICD-10-CM | POA: Diagnosis not present

## 2019-02-01 DIAGNOSIS — M7501 Adhesive capsulitis of right shoulder: Secondary | ICD-10-CM | POA: Diagnosis not present

## 2019-02-01 DIAGNOSIS — Z853 Personal history of malignant neoplasm of breast: Secondary | ICD-10-CM | POA: Insufficient documentation

## 2019-02-01 DIAGNOSIS — Z79899 Other long term (current) drug therapy: Secondary | ICD-10-CM | POA: Diagnosis not present

## 2019-02-01 DIAGNOSIS — F419 Anxiety disorder, unspecified: Secondary | ICD-10-CM | POA: Insufficient documentation

## 2019-02-01 DIAGNOSIS — K219 Gastro-esophageal reflux disease without esophagitis: Secondary | ICD-10-CM | POA: Insufficient documentation

## 2019-02-01 DIAGNOSIS — G43909 Migraine, unspecified, not intractable, without status migrainosus: Secondary | ICD-10-CM | POA: Insufficient documentation

## 2019-02-01 HISTORY — PX: SHOULDER ARTHROSCOPY WITH ROTATOR CUFF REPAIR AND OPEN BICEPS TENODESIS: SHX6677

## 2019-02-01 LAB — BASIC METABOLIC PANEL
Anion gap: 10 (ref 5–15)
BUN: 17 mg/dL (ref 8–23)
CO2: 27 mmol/L (ref 22–32)
Calcium: 8.6 mg/dL — ABNORMAL LOW (ref 8.9–10.3)
Chloride: 102 mmol/L (ref 98–111)
Creatinine, Ser: 0.75 mg/dL (ref 0.44–1.00)
GFR calc Af Amer: >60 mL/min (ref >60)
GFR calc non Af Amer: >60 mL/min (ref >60)
Glucose, Bld: 103 mg/dL — ABNORMAL HIGH (ref 70–99)
Potassium: 3.9 mmol/L (ref 3.5–5.1)
Sodium: 139 mmol/L (ref 135–145)

## 2019-02-01 LAB — CBC
HCT: 37.3 % (ref 36.0–46.0)
Hemoglobin: 11.9 g/dL — ABNORMAL LOW (ref 12.0–15.0)
MCH: 27.6 pg (ref 26.0–34.0)
MCHC: 31.9 g/dL (ref 30.0–36.0)
MCV: 86.5 fL (ref 80.0–100.0)
Platelets: 224 10*3/uL (ref 150–400)
RBC: 4.31 MIL/uL (ref 3.87–5.11)
RDW: 13.3 % (ref 11.5–15.5)
WBC: 5.3 10*3/uL (ref 4.0–10.5)
nRBC: 0 % (ref 0.0–0.2)

## 2019-02-01 SURGERY — SHOULDER ARTHROSCOPY WITH ROTATOR CUFF REPAIR AND OPEN BICEPS TENODESIS
Anesthesia: General | Site: Shoulder | Laterality: Right

## 2019-02-01 MED ORDER — BUPIVACAINE HCL (PF) 0.25 % IJ SOLN
INTRAMUSCULAR | Status: AC
  Filled 2019-02-01: qty 30

## 2019-02-01 MED ORDER — MIDAZOLAM HCL 2 MG/2ML IJ SOLN
1.0000 mg | INTRAMUSCULAR | Status: DC | PRN
  Administered 2019-02-01: 10:00:00 2 mg via INTRAVENOUS

## 2019-02-01 MED ORDER — MEPERIDINE HCL 25 MG/ML IJ SOLN: 6.2500 mg | INTRAMUSCULAR | Status: DC | PRN

## 2019-02-01 MED ORDER — SCOPOLAMINE 1 MG/3DAYS TD PT72
MEDICATED_PATCH | TRANSDERMAL | Status: AC
  Filled 2019-02-01: qty 1

## 2019-02-01 MED ORDER — PHENYLEPHRINE HCL (PRESSORS) 10 MG/ML IV SOLN
INTRAVENOUS | Status: DC | PRN
  Administered 2019-02-01 (×7): 40 ug via INTRAVENOUS
  Administered 2019-02-01: 60 ug via INTRAVENOUS
  Administered 2019-02-01: 80 ug via INTRAVENOUS
  Administered 2019-02-01: 40 ug via INTRAVENOUS
  Administered 2019-02-01: 60 ug via INTRAVENOUS

## 2019-02-01 MED ORDER — ONDANSETRON HCL 4 MG/2ML IJ SOLN
INTRAMUSCULAR | Status: DC | PRN
  Administered 2019-02-01: 4 mg via INTRAVENOUS

## 2019-02-01 MED ORDER — MIDAZOLAM HCL 2 MG/2ML IJ SOLN
INTRAMUSCULAR | Status: AC
  Filled 2019-02-01: qty 2

## 2019-02-01 MED ORDER — ROCURONIUM BROMIDE 100 MG/10ML IV SOLN
INTRAVENOUS | Status: DC | PRN
  Administered 2019-02-01: 100 mg via INTRAVENOUS

## 2019-02-01 MED ORDER — CHLORHEXIDINE GLUCONATE 4 % EX LIQD: 60.0000 mL | Freq: Once | CUTANEOUS | Status: DC

## 2019-02-01 MED ORDER — PROPOFOL 10 MG/ML IV BOLUS
INTRAVENOUS | Status: DC | PRN
  Administered 2019-02-01: 160 mg via INTRAVENOUS

## 2019-02-01 MED ORDER — OXYCODONE HCL 5 MG/5ML PO SOLN: 5.0000 mg | Freq: Once | ORAL | Status: DC | PRN

## 2019-02-01 MED ORDER — LACTATED RINGERS IV SOLN
INTRAVENOUS | Status: DC
  Administered 2019-02-01 (×2): via INTRAVENOUS

## 2019-02-01 MED ORDER — CEFAZOLIN SODIUM-DEXTROSE 2-4 GM/100ML-% IV SOLN: 2.0000 g | INTRAVENOUS | Status: DC

## 2019-02-01 MED ORDER — CEFAZOLIN SODIUM-DEXTROSE 2-4 GM/100ML-% IV SOLN
INTRAVENOUS | Status: AC
  Filled 2019-02-01: qty 100

## 2019-02-01 MED ORDER — ONDANSETRON HCL 4 MG/2ML IJ SOLN: 4.0000 mg | Freq: Once | INTRAMUSCULAR | Status: DC | PRN

## 2019-02-01 MED ORDER — FENTANYL CITRATE (PF) 100 MCG/2ML IJ SOLN: 25.0000 ug | INTRAMUSCULAR | Status: DC | PRN

## 2019-02-01 MED ORDER — BUPIVACAINE-EPINEPHRINE (PF) 0.25% -1:200000 IJ SOLN
INTRAMUSCULAR | Status: AC
  Filled 2019-02-01: qty 30

## 2019-02-01 MED ORDER — SCOPOLAMINE 1 MG/3DAYS TD PT72
1.0000 | MEDICATED_PATCH | Freq: Once | TRANSDERMAL | Status: DC | PRN
  Administered 2019-02-01: 1.5 mg via TRANSDERMAL

## 2019-02-01 MED ORDER — CEFAZOLIN SODIUM-DEXTROSE 2-3 GM-%(50ML) IV SOLR
INTRAVENOUS | Status: DC | PRN
  Administered 2019-02-01: 2 g via INTRAVENOUS

## 2019-02-01 MED ORDER — EPINEPHRINE PF 1 MG/ML IJ SOLN
INTRAMUSCULAR | Status: AC
  Filled 2019-02-01: qty 1

## 2019-02-01 MED ORDER — BUPIVACAINE LIPOSOME 1.3 % IJ SUSP
INTRAMUSCULAR | Status: DC | PRN
  Administered 2019-02-01: 10 mL

## 2019-02-01 MED ORDER — DEXAMETHASONE SODIUM PHOSPHATE 4 MG/ML IJ SOLN
INTRAMUSCULAR | Status: DC | PRN
  Administered 2019-02-01: 5 mg via INTRAVENOUS

## 2019-02-01 MED ORDER — GLYCOPYRROLATE 0.2 MG/ML IJ SOLN
INTRAMUSCULAR | Status: DC | PRN
  Administered 2019-02-01: 0.2 mg via INTRAVENOUS

## 2019-02-01 MED ORDER — FENTANYL CITRATE (PF) 100 MCG/2ML IJ SOLN
50.0000 ug | INTRAMUSCULAR | Status: DC | PRN
  Administered 2019-02-01 (×2): 100 ug via INTRAVENOUS

## 2019-02-01 MED ORDER — BUPIVACAINE-EPINEPHRINE (PF) 0.5% -1:200000 IJ SOLN
INTRAMUSCULAR | Status: DC | PRN
  Administered 2019-02-01: 15 mL via PERINEURAL

## 2019-02-01 MED ORDER — EPHEDRINE SULFATE 50 MG/ML IJ SOLN
INTRAMUSCULAR | Status: DC | PRN
  Administered 2019-02-01 (×3): 5 mg via INTRAVENOUS
  Administered 2019-02-01 (×2): 10 mg via INTRAVENOUS

## 2019-02-01 MED ORDER — FENTANYL CITRATE (PF) 100 MCG/2ML IJ SOLN
INTRAMUSCULAR | Status: AC
  Filled 2019-02-01: qty 2

## 2019-02-01 MED ORDER — OXYCODONE HCL 5 MG PO TABS: 5.0000 mg | ORAL_TABLET | Freq: Once | ORAL | Status: DC | PRN

## 2019-02-01 MED ORDER — SODIUM CHLORIDE 0.9 % IR SOLN
Status: DC | PRN
  Administered 2019-02-01: 3000 mL

## 2019-02-01 MED ORDER — SUGAMMADEX SODIUM 500 MG/5ML IV SOLN
INTRAVENOUS | Status: DC | PRN
  Administered 2019-02-01: 350 mg via INTRAVENOUS

## 2019-02-01 SURGICAL SUPPLY — 97 items
ANCHOR SUT BIOCOMP CORKSREW (Anchor) ×4 IMPLANT
ANCHOR SUT SWIVELLOK BIO (Anchor) ×2 IMPLANT
BENZOIN TINCTURE PRP APPL 2/3 (GAUZE/BANDAGES/DRESSINGS) IMPLANT
BLADE EXCALIBUR 4.0X13 (MISCELLANEOUS) IMPLANT
BLADE SHAVER BONE 5.0X13 (MISCELLANEOUS) IMPLANT
BLADE SURG 10 STRL SS (BLADE) IMPLANT
BLADE SURG 15 STRL LF DISP TIS (BLADE) ×2 IMPLANT
BLADE SURG 15 STRL SS (BLADE) ×2
BURR OVAL 8 FLU 5.0X13 (MISCELLANEOUS) IMPLANT
CANNULA SHOULDER 7CM (CANNULA) ×2 IMPLANT
CHLORAPREP W/TINT 26 (MISCELLANEOUS) ×2 IMPLANT
CLEANER CAUTERY TIP 5X5 PAD (MISCELLANEOUS) IMPLANT
COVER WAND RF STERILE (DRAPES) IMPLANT
DECANTER SPIKE VIAL GLASS SM (MISCELLANEOUS) IMPLANT
DISSECTOR  3.8MM X 13CM (MISCELLANEOUS)
DISSECTOR 3.8MM X 13CM (MISCELLANEOUS) IMPLANT
DISSECTOR 4.0MM X 13CM (MISCELLANEOUS) ×2 IMPLANT
DRAPE ARTHROSCOPY W/POUCH 90 (DRAPES) IMPLANT
DRAPE INCISE IOBAN 66X45 STRL (DRAPES) ×4 IMPLANT
DRAPE STERI 35X30 U-POUCH (DRAPES) ×2 IMPLANT
DRAPE U-SHAPE 47X51 STRL (DRAPES) ×8 IMPLANT
DRAPE U-SHAPE 76X120 STRL (DRAPES) ×4 IMPLANT
DRSG EMULSION OIL 3X3 NADH (GAUZE/BANDAGES/DRESSINGS) ×2 IMPLANT
DRSG PAD ABDOMINAL 8X10 ST (GAUZE/BANDAGES/DRESSINGS) ×2 IMPLANT
DRSG TEGADERM 4X4.75 (GAUZE/BANDAGES/DRESSINGS) ×6 IMPLANT
ELECT MENISCUS 165MM 90D (ELECTRODE) IMPLANT
ELECT NEEDLE TIP 2.8 STRL (NEEDLE) ×2 IMPLANT
ELECT REM PT RETURN 9FT ADLT (ELECTROSURGICAL) ×2
ELECTRODE REM PT RTRN 9FT ADLT (ELECTROSURGICAL) ×1 IMPLANT
EXCALIBUR 3.8MM X 13CM (MISCELLANEOUS) IMPLANT
GAUZE 4X4 16PLY RFD (DISPOSABLE) ×2 IMPLANT
GAUZE SPONGE 4X4 12PLY STRL (GAUZE/BANDAGES/DRESSINGS) ×2 IMPLANT
GLOVE BIO SURGEON STRL SZ 6.5 (GLOVE) ×6 IMPLANT
GLOVE BIOGEL PI IND STRL 7.0 (GLOVE) ×3 IMPLANT
GLOVE BIOGEL PI IND STRL 8 (GLOVE) ×1 IMPLANT
GLOVE BIOGEL PI INDICATOR 7.0 (GLOVE) ×3
GLOVE BIOGEL PI INDICATOR 8 (GLOVE) ×1
GLOVE SURG ORTHO 8.0 STRL STRW (GLOVE) ×2 IMPLANT
GOWN STRL REUS W/ TWL LRG LVL3 (GOWN DISPOSABLE) ×4 IMPLANT
GOWN STRL REUS W/TWL LRG LVL3 (GOWN DISPOSABLE) ×4
KIT BIO-TENODESIS 3X8 DISP (MISCELLANEOUS) ×1
KIT INSRT BABSR STRL DISP BTN (MISCELLANEOUS) ×1 IMPLANT
MANIFOLD NEPTUNE II (INSTRUMENTS) ×2 IMPLANT
NDL SAFETY ECLIPSE 18X1.5 (NEEDLE) ×2 IMPLANT
NDL SUT 6 .5 CRC .975X.05 MAYO (NEEDLE) IMPLANT
NEEDLE HYPO 18GX1.5 SHARP (NEEDLE) ×2
NEEDLE MAYO TAPER (NEEDLE)
NEEDLE SCORPION MULTI FIRE (NEEDLE) ×2 IMPLANT
NS IRRIG 1000ML POUR BTL (IV SOLUTION) IMPLANT
PACK ARTHROSCOPY DSU (CUSTOM PROCEDURE TRAY) ×2 IMPLANT
PACK BASIN DAY SURGERY FS (CUSTOM PROCEDURE TRAY) ×2 IMPLANT
PAD CLEANER CAUTERY TIP 5X5 (MISCELLANEOUS)
PENCIL BUTTON HOLSTER BLD 10FT (ELECTRODE) ×2 IMPLANT
PORT APPOLLO RF 90DEGREE MULTI (SURGICAL WAND) ×2 IMPLANT
PUSHLOCK PEEK 4.5X24 (Orthopedic Implant) ×4 IMPLANT
RESTRAINT HEAD UNIVERSAL NS (MISCELLANEOUS) ×2 IMPLANT
SHEET MEDIUM DRAPE 40X70 STRL (DRAPES) IMPLANT
SLEEVE SCD COMPRESS KNEE MED (MISCELLANEOUS) ×2 IMPLANT
SLING ARM FOAM STRAP LRG (SOFTGOODS) IMPLANT
SLING ARM IMMOBILIZER MED (SOFTGOODS) ×2 IMPLANT
SLING ARM MED ADULT FOAM STRAP (SOFTGOODS) IMPLANT
SPONGE LAP 18X18 RF (DISPOSABLE) ×4 IMPLANT
SPONGE LAP 4X18 RFD (DISPOSABLE) IMPLANT
SPONGE SURGIFOAM ABS GEL 12-7 (HEMOSTASIS) IMPLANT
STAPLER VISISTAT 35W (STAPLE) IMPLANT
STRIP CLOSURE SKIN 1/2X4 (GAUZE/BANDAGES/DRESSINGS) IMPLANT
SUCTION FRAZIER HANDLE 10FR (MISCELLANEOUS) ×1
SUCTION TUBE FRAZIER 10FR DISP (MISCELLANEOUS) ×1 IMPLANT
SUT BONE WAX W31G (SUTURE) IMPLANT
SUT ETHIBOND 2 OS 4 DA (SUTURE) IMPLANT
SUT ETHILON 3 0 PS 1 (SUTURE) ×2 IMPLANT
SUT ETHILON 4 0 PS 2 18 (SUTURE) IMPLANT
SUT FIBERWIRE #2 38 T-5 BLUE (SUTURE)
SUT FIBERWIRE 2-0 18 17.9 3/8 (SUTURE) ×6
SUT MNCRL AB 3-0 PS2 18 (SUTURE) ×2 IMPLANT
SUT PDS AB 0 CT 36 (SUTURE) IMPLANT
SUT TICRON 1 T 12 (SUTURE) IMPLANT
SUT VIC AB 0 CT1 27 (SUTURE)
SUT VIC AB 0 CT1 27XBRD ANBCTR (SUTURE) IMPLANT
SUT VIC AB 1 CT1 27 (SUTURE) ×1
SUT VIC AB 1 CT1 27XBRD ANBCTR (SUTURE) ×1 IMPLANT
SUT VIC AB 2-0 SH 27 (SUTURE)
SUT VIC AB 2-0 SH 27XBRD (SUTURE) IMPLANT
SUT VICRYL 0 UR6 27IN ABS (SUTURE) ×4 IMPLANT
SUT VICRYL 4-0 PS2 18IN ABS (SUTURE) IMPLANT
SUTURE FIBERWR #2 38 T-5 BLUE (SUTURE) IMPLANT
SUTURE FIBERWR 2-0 18 17.9 3/8 (SUTURE) ×3 IMPLANT
SYR 5ML LL (SYRINGE) ×2 IMPLANT
SYR BULB 3OZ (MISCELLANEOUS) ×2 IMPLANT
TAPE CLOTH SURG 6X10 WHT LF (GAUZE/BANDAGES/DRESSINGS) ×2 IMPLANT
TAPE HYPAFIX 6X30 (GAUZE/BANDAGES/DRESSINGS) IMPLANT
TAPE STRIPS DRAPE STRL (GAUZE/BANDAGES/DRESSINGS) ×2 IMPLANT
TOWEL GREEN STERILE FF (TOWEL DISPOSABLE) ×2 IMPLANT
TUBE CONNECTING 20X1/4 (TUBING) ×2 IMPLANT
TUBING ARTHROSCOPY IRRIG 16FT (MISCELLANEOUS) ×2 IMPLANT
WATER STERILE IRR 1000ML POUR (IV SOLUTION) IMPLANT
YANKAUER SUCT BULB TIP NO VENT (SUCTIONS) ×2 IMPLANT

## 2019-02-01 NOTE — Progress Notes (Signed)
Assisted Dr. Royce Macadamia with right, ultrasound guided, interscalene  block. Side rails up, monitors on throughout procedure. See vital signs in flow sheet. Tolerated Procedure well.

## 2019-02-01 NOTE — Op Note (Signed)
NAME: [name], MARLIN MEDICAL RECORD [id number] ACCOUNT [id number] DATE OF BIRTH:March 24, 1956 FACILITY: [facility] LOCATION: [facility] PHYSICIAN:Timia Casselman Randel Pigg, MD  OPERATIVE REPORT  DATE OF PROCEDURE:  02/01/2019  PREOPERATIVE DIAGNOSIS:  Right shoulder rotator cuff tear and biceps tendinitis.  POSTOPERATIVE DIAGNOSIS:  Early frozen shoulder with right shoulder rotator cuff tear measuring about 2 x 2 cm with biceps tendinitis and early right frozen shoulder.  PROCEDURE:  Right shoulder arthroscopy with rotator interval release and debridement of superior labrum and release of the biceps tendon with subsequent open subacromial decompression, mini open rotator cuff repair using Arthrex anchors and corkscrew x2,  PushLock x2 with biceps tenodesis in the intertubercular groove using SwiveLock x1 from Arthrex 6.25 mm.  SURGEON:  Meredith Pel, MD  ASSISTANT:  None.  ANESTHESIA:  General.  INDICATIONS:  The patient is a 63 year old patient with right shoulder pain and rotator cuff tear as well as biceps tendinitis who presents for operative management after explanation of risks and benefits.  PROCEDURE IN DETAIL:  The patient was brought to the operating room where general anesthetic was induced.  Preoperative antibiotics administered.  Timeout was called.  Right shoulder was examined under anesthesia and was found to have generally symmetric  external rotation at 15 degrees of abduction, but about 10 degrees less forward flexion on the right compared to the left and about 10 degrees less isolated glenohumeral abduction on the right compared to the left.  Following examination under  anesthesia, the patient's right arm was prescrubbed with alcohol and Betadine, allowed to air dry.  Prep with DuraPrep solution and draped in a sterile manner.  The posterior portal was created 2 cm medial and inferior to the posterolateral margin of the  acromion.  Anterior portal created under direct  visualization.  Diagnostic arthroscopy was performed.  The patient did have some synovitis within the rotator interval and on the undersurface of the rotator cuff capsule.  Rotator cuff tear was identified  about 2 x 2 cm.  This was leading edge of the supraspinatus.  Biceps tendonopathy was also present with tearing.  Biceps tendon was released.  Superior labrum debrided.  Rotator interval also released, which did improve her external rotation.  Following  this, the instruments were removed.  Portals were closed using 3-0 nylon.  Charlie Pitter was then used to cover the entire operative field and an incision was made off the anterolateral margin of the acromion.  Stay suture was placed about 4 cm distal to the  anterolateral margin of the acromion between the middle and anterior raphae of the deltoid.  This raphae was then divided and bursectomy performed.  Subacromial decompression was then performed with acromioplasty.  Biceps was then tenodesed into the  intertubercular groove using an Arthrex 6.25 SwiveLock and appropriate tension.  This gave secure fixation.  Bone quality was actually very good.  Following this, attention was redirected towards the rotator cuff.  The patient had a U-shaped rotator cuff  tear.  This was debrided as well as the footprint was debrided.  Two corkscrew suture anchors were placed 5.5 mm from Arthrex.  At this time, the suture limbs were passed x8 suture tapes into the rotator cuff tendon.  These were evenly spaced.   Following this, three 2-0 FiberWire sutures were placed in inverted fashion to close that rotator tear from side-to-side.  At this time, the suture tapes were tied and then crossed and then tapped into position into the proximal humerus using ____  PushLocks.  This  gave a very secure repair.  Thorough irrigation was then performed.  Deltoid split closed using #1 Vicryl suture followed by interrupted inverted 0 Vicryl suture, 2-0 Vicryl suture and a 3-0 Monocryl.   Steri-Strips and an impervious  dressing was applied.  The patient tolerated the procedure well without immediate complications.  He was transferred to the recovery room in stable condition.  TN/NUANCE  D:02/01/2019 T:02/01/2019 JOB:[id number]

## 2019-02-01 NOTE — Anesthesia Procedure Notes (Signed)
Anesthesia Regional Block: Interscalene brachial plexus block   Pre-Anesthetic Checklist: ,, timeout performed, Correct Patient, Correct Site, Correct Laterality, Correct Procedure, Correct Position, site marked, Risks and benefits discussed,  Surgical consent,  Pre-op evaluation,  At surgeon's request and post-op pain management  Laterality: Right  Prep: chloraprep       Needles:  Injection technique: Single-shot  Needle Type: Echogenic Stimulator Needle     Needle Length: 9cm  Needle Gauge: 21   Needle insertion depth: 5 cm   Additional Needles:   Procedures:,,,, ultrasound used (permanent image in chart),,,,  Narrative:  Start time: 02/01/2019 9:45 AM End time: 02/01/2019 9:51 AM Injection made incrementally with aspirations every 5 mL.  Performed by: Personally  Anesthesiologist: Josephine Igo, MD  Additional Notes: Timeout performed. Patient sedated. Relevant anatomy ID'd using [place]. Incremental 2-60ml injection of LA with frequent aspiration. Patient tolerated procedure well.        Right interscalene block

## 2019-02-01 NOTE — Discharge Instructions (Signed)

## 2019-02-01 NOTE — Anesthesia Preprocedure Evaluation (Signed)
Anesthesia Evaluation  Patient identified by MRN, date of birth, ID band Patient awake    Reviewed: Allergy & Precautions, NPO status , Patient's Chart, lab work & pertinent test results  Airway Mallampati: II  TM Distance: >3 FB Neck ROM: Full    Dental  (+) Partial Upper   Pulmonary neg pulmonary ROS,    Pulmonary exam normal breath sounds clear to auscultation       Cardiovascular negative cardio ROS   Rhythm:Regular Rate:Abnormal     Neuro/Psych  Headaches, PSYCHIATRIC DISORDERS Anxiety    GI/Hepatic Neg liver ROS, GERD  Medicated and Controlled,  Endo/Other  Hyperlipidemia Hx/o Right Breast Ca S/P lumpectomy RT  Renal/GU negative Renal ROS   SUI    Musculoskeletal  (+) Arthritis , Osteoarthritis,  Torn Rotator Cuff Right Shoulder Right Biceps tendenitis   Abdominal   Peds  Hematology  (+) anemia ,   Anesthesia Other Findings   Reproductive/Obstetrics                             Anesthesia Physical Anesthesia Plan  ASA: II  Anesthesia Plan: General   Post-op Pain Management:  Regional for Post-op pain   Induction: Intravenous  PONV Risk Score and Plan: 4 or greater and Scopolamine patch - Pre-op, Midazolam, Ondansetron, Dexamethasone and Treatment may vary due to age or medical condition  Airway Management Planned: Oral ETT  Additional Equipment:   Intra-op Plan:   Post-operative Plan: Extubation in OR  Informed Consent: I have reviewed the patients History and Physical, chart, labs and discussed the procedure including the risks, benefits and alternatives for the proposed anesthesia with the patient or authorized representative who has indicated his/her understanding and acceptance.     Dental advisory given  Plan Discussed with: CRNA and Surgeon  Anesthesia Plan Comments:         Anesthesia Quick Evaluation

## 2019-02-01 NOTE — Anesthesia Procedure Notes (Signed)
Procedure Name: Intubation Performed by: Verita Lamb, CRNA Pre-anesthesia Checklist: Patient identified, Emergency Drugs available, Suction available, Patient being monitored and Timeout performed Oxygen Delivery Method: Circle system utilized Preoxygenation: Pre-oxygenation with 100% oxygen Induction Type: IV induction Ventilation: Mask ventilation without difficulty Laryngoscope Size: Mac and 3 Grade View: Grade I Tube type: Oral Tube size: 7.0 mm Number of attempts: 1 Airway Equipment and Method: Stylet Placement Confirmation: ETT inserted through vocal cords under direct vision,  positive ETCO2,  CO2 detector and breath sounds checked- equal and bilateral Secured at: 20 cm Tube secured with: Tape Dental Injury: Teeth and Oropharynx as per pre-operative assessment

## 2019-02-01 NOTE — H&P (Signed)
[name] is an 63 y.o. female.   Chief Complaint: Right shoulder pain HPI: [name] is a 63 year old patient with right shoulder pain of several months duration.  She is failed nonoperative measures.  MRI scan shows rotator cuff tear along with biceps tendinopathy.  She presents now for operative management after explanation of risks and benefits.  Past Medical History:  Diagnosis Date  . Anemia   . Anxiety   . Blood transfusion without reported diagnosis   . Breast cancer ([place]) 02/27/2007   Right  . GERD (gastroesophageal reflux disease)   . Hypercholesterolemia   . Migraine   . Personal history of radiation therapy 2008   Right  . Wears dentures    partial upper    Past Surgical History:  Procedure Laterality Date  . ABDOMINAL HYSTERECTOMY    . BREAST BIOPSY Right 02/27/2007  . BREAST LUMPECTOMY Right 04/02/2007  . BREAST SURGERY  01/14/2013   breast reduction left side  . BROW LIFT Bilateral 06/17/2017   Procedure: BLEPHAROPLASTY UPPER EYELID WITH EXCESS SKIN;  Surgeon: Karle Starch, MD;  Location: [facility];  Service: Ophthalmology;  Laterality: Bilateral;  . CESAREAN SECTION     two times  . COLONOSCOPY    . FOOT SURGERY Right   . PTOSIS REPAIR Bilateral 06/17/2017   Procedure: PTOSIS REPAIR RESECT EX;  Surgeon: Karle Starch, MD;  Location: [facility];  Service: Ophthalmology;  Laterality: Bilateral;  . REDUCTION MAMMAPLASTY Left     Family History  Problem Relation Age of Onset  . Thyroid disease Mother   . Hypertension Mother   . Hyperlipidemia Mother   . Alzheimer's disease Mother   . Heart failure Mother   . Esophageal cancer Father   . Hyperlipidemia Father   . Heart murmur Father   . Colon polyps Father   . Diabetes Sister   . Alzheimer's disease Maternal Aunt   . Alzheimer's disease Maternal Uncle   . Diabetes Paternal Uncle   . Alzheimer's disease Maternal Grandmother   . Diabetes Paternal Grandmother   . Breast cancer Sister    . Irritable bowel syndrome Sister   . Ulcerative colitis Sister        ????  . Breast cancer Cousin   . Breast cancer Cousin   . Colon cancer Neg Hx    Social History:  reports that she has never smoked. She has never used smokeless tobacco. She reports that she does not drink alcohol or use drugs.  Allergies:  Allergies  Allergen Reactions  . Statins Other (See Comments)    Muscle aches    Medications Prior to Admission  Medication Sig Dispense Refill  . cetirizine-pseudoephedrine (ZYRTEC-D ALLERGY & CONGESTION) 5-120 MG tablet Take 1 tablet by mouth 2 (two) times daily. 60 tablet 0  . Cyanocobalamin (B-12 PO) Take by mouth daily.    . diclofenac (VOLTAREN) 75 MG EC tablet TAKE 1 TABLET BY MOUTH TWICE A DAY 60 tablet 1  . escitalopram (LEXAPRO) 20 MG tablet Take 1 tablet (20 mg total) by mouth daily. 90 tablet 1  . fluticasone (FLONASE) 50 MCG/ACT nasal spray Place 2 sprays into both nostrils daily. 16 g 6  . ibuprofen (ADVIL,MOTRIN) 800 MG tablet Take 1 tablet (800 mg total) by mouth every 8 (eight) hours as needed. 90 tablet 3  . Multiple Vitamin (MULTIVITAMIN) tablet Take 1 tablet by mouth daily.    [name] omeprazole (PRILOSEC) 20 MG capsule Take 1 capsule (20 mg total) by  mouth daily. (Patient taking differently: Take 20 mg by mouth daily as needed (acid reflux). ) 90 capsule 2  . promethazine (PHENERGAN) 25 MG tablet Take 1 tablet (25 mg total) by mouth every 6 (six) hours as needed for nausea or vomiting. 30 tablet 1  . SUMAtriptan (IMITREX) 25 MG tablet Take 1 tablet (25 mg total) by mouth every 2 (two) hours as needed for migraine. May repeat in 2 hours if headache persists or recurs. 10 tablet 0  . traMADol (ULTRAM) 50 MG tablet TAKE 1 TABLET BY MOUTH EVERY 6 HOURS 30 tablet 5    Results for orders placed or performed during the hospital encounter of 02/01/19 (from the past 48 hour(s))  CBC     Status: Abnormal   Collection Time: 02/01/19  9:09 AM  Result Value Ref Range    WBC 5.3 4.0 - 10.5 K/uL   RBC 4.31 3.87 - 5.11 MIL/uL   Hemoglobin 11.9 (L) 12.0 - 15.0 g/dL   HCT 37.3 36.0 - 46.0 %   MCV 86.5 80.0 - 100.0 fL   MCH 27.6 26.0 - 34.0 pg   MCHC 31.9 30.0 - 36.0 g/dL   RDW 13.3 11.5 - 15.5 %   Platelets 224 150 - 400 K/uL   nRBC 0.0 0.0 - 0.2 %    Comment: Performed at [facility], [place] [address], [place], [place] [address]   No results found.  Review of Systems  Musculoskeletal: Positive for joint pain.  All other systems reviewed and are negative.   Blood pressure (!) 157/79, pulse 87, temperature 98.4 F (36.9 C), temperature source Oral, resp. rate 16, height 5\' 4"  (1.626 m), weight 81.2 kg, SpO2 100 %. Physical Exam  Constitutional: She appears well-developed.  HENT:  Head: Normocephalic.  Eyes: Pupils are equal, round, and reactive to light.  Neck: Normal range of motion.  Cardiovascular: Normal rate.  Respiratory: Effort normal.  Neurological: She is alert.  Skin: Skin is warm.  Psychiatric: She has a normal mood and affect.  Examination of the right shoulder demonstrates maintained passive range of motion of the right shoulder.  Patient does have some weakness to supraspinatus testing.  Positive O'Brien's testing on the right.  Not much in the way of asymmetric AC joint tenderness right versus left.  Neck range of motion otherwise full.  Assessment/Plan Impression is right shoulder rotator cuff tear biceps tendinitis and tendinopathy with some mild AC joint arthritis which is not particularly symptomatic at this time.  Plan is arthroscopy with biceps tendon release mini open rotator cuff tear repair of a medium-sized tear with she does not have retraction.  Risk and benefits are discussed including but limited to infection nerve vessel damage incomplete healing as well as potential need for more surgery.  The extensive nature of the rehabilitative process involved was also discussed.  All questions answered.  Plan to use CPM machine  post surgery.  Anderson Malta, MD 02/01/2019, 10:20 AM

## 2019-02-01 NOTE — Brief Op Note (Signed)
   02/01/2019  1:05 PM  PATIENT:  [name]  63 y.o. female  PRE-OPERATIVE DIAGNOSIS:  right shouder rotator cuff tear, biceps tendonitis  POST-OPERATIVE DIAGNOSIS:  right shouder rotator cuff tear, biceps tendonitis  PROCEDURE:  Procedure(s): RIGHT SHOULDER ARTHROSCOPY, DEBRIDEMENT, BICEPS TENODESIS, MINI ROTATOR CUFF TEAR REPAIR  SURGEON:  Surgeon(s): Meredith Pel, MD  ASSISTANT: none  ANESTHESIA:   general  EBL: 25 ml    Total I/O In: 1300 [I.V.:1300] Out: -   BLOOD ADMINISTERED: none  DRAINS: none   LOCAL MEDICATIONS USED:  none  SPECIMEN:  No Specimen  COUNTS:  YES  TOURNIQUET:  * No tourniquets in log *  DICTATION: .Other Dictation: Dictation Number [id number]  PLAN OF CARE: Discharge to home after PACU  PATIENT DISPOSITION:  PACU - hemodynamically stable

## 2019-02-01 NOTE — Anesthesia Postprocedure Evaluation (Signed)
Anesthesia Post Note  Patient: [name]  Procedure(s) Performed: RIGHT SHOULDER ARTHROSCOPY, DEBRIDEMENT, BICEPS TENODESIS, MINI ROTATOR CUFF TEAR REPAIR (Right Shoulder)     Patient location during evaluation: PACU Anesthesia Type: General Level of consciousness: awake and alert Pain management: pain level controlled Vital Signs Assessment: post-procedure vital signs reviewed and stable Respiratory status: spontaneous breathing, nonlabored ventilation, respiratory function stable and patient connected to nasal cannula oxygen Cardiovascular status: blood pressure returned to baseline and stable Postop Assessment: no apparent nausea or vomiting Anesthetic complications: no    Last Vitals:  Vitals:   02/01/19 1352 02/01/19 1420  BP: (!) 144/82 (!) 148/90  Pulse: 97 98  Resp: 17 18  Temp:  37.2 C  SpO2: 93% 94%    Last Pain:  Vitals:   02/01/19 1420  TempSrc:   PainSc: 0-No pain                 Montez Hageman

## 2019-02-01 NOTE — Transfer of Care (Signed)
Immediate Anesthesia Transfer of Care Note  Patient: [name]  Procedure(s) Performed: RIGHT SHOULDER ARTHROSCOPY, DEBRIDEMENT, BICEPS TENODESIS, MINI ROTATOR CUFF TEAR REPAIR (Right Shoulder)  Patient Location: PACU  Anesthesia Type:General and Regional  Level of Consciousness: awake, alert  and oriented  Airway & Oxygen Therapy: Patient Spontanous Breathing and Patient connected to face mask oxygen  Post-op Assessment: Report given to RN and Post -op Vital signs reviewed and stable  Post vital signs: Reviewed and stable  Last Vitals:  Vitals Value Taken Time  BP    Temp    Pulse    Resp    SpO2      Last Pain:  Vitals:   02/01/19 0901  TempSrc: Oral  PainSc: 0-No pain         Complications: No apparent anesthesia complications

## 2019-02-02 ENCOUNTER — Encounter (HOSPITAL_BASED_OUTPATIENT_CLINIC_OR_DEPARTMENT_OTHER): Payer: Self-pay | Admitting: Orthopedic Surgery

## 2019-02-02 ENCOUNTER — Telehealth: Payer: Self-pay | Admitting: Orthopedic Surgery

## 2019-02-02 NOTE — Telephone Encounter (Signed)
Please advise on how you would like patient to use CPM.

## 2019-02-02 NOTE — Telephone Encounter (Signed)
Pt called and has some questions about the CPM machine. She received 2 papers stating different ways that she should use it. Patient just needs some clarification.

## 2019-02-02 NOTE — Telephone Encounter (Signed)
1 hour three times a day increase gegrees daily - start at 30 try to increase 3 - 5 degrees per day

## 2019-02-02 NOTE — Telephone Encounter (Signed)
I called patient and explained. She would like to know how long she needs to use the incentive spirometer?

## 2019-02-03 NOTE — Telephone Encounter (Signed)
5 days post op

## 2019-02-03 NOTE — Telephone Encounter (Signed)
I returned call to patient to advise.  She states that she is coughing, but is unable to get anything to come up. She is working on [employer] and has gotten it to 1200. She denies shortness of breath when sitting or with normal activities, but states it is hard to catch her breath when breathing into the spirometer. She thinks she may have something in her lungs, but feels it will get better. Temperature was 100.3 last night but is back to normal today. She has had sinus drainage prior to surgery and took Zyrtec D today. Please advise if you would like for her to do anything further.

## 2019-02-03 NOTE — Telephone Encounter (Signed)
I think as long as she is up walking around she should be okay to hold off on using that spirometer.

## 2019-02-03 NOTE — Telephone Encounter (Signed)
IC no answer. Left detailed VM advising per Dr Marlou Sa

## 2019-02-03 NOTE — Telephone Encounter (Signed)
pls review.

## 2019-02-09 ENCOUNTER — Encounter: Payer: Self-pay | Admitting: Family Medicine

## 2019-02-10 ENCOUNTER — Other Ambulatory Visit: Payer: Self-pay

## 2019-02-10 ENCOUNTER — Encounter: Payer: Self-pay | Admitting: Orthopedic Surgery

## 2019-02-10 ENCOUNTER — Ambulatory Visit (INDEPENDENT_AMBULATORY_CARE_PROVIDER_SITE_OTHER): Payer: PRIVATE HEALTH INSURANCE | Admitting: Orthopedic Surgery

## 2019-02-10 VITALS — Ht 64.5 in | Wt 175.0 lb

## 2019-02-10 DIAGNOSIS — M75121 Complete rotator cuff tear or rupture of right shoulder, not specified as traumatic: Secondary | ICD-10-CM

## 2019-02-10 NOTE — Progress Notes (Signed)
Post-Op Visit Note   Patient: [name]           Date of Birth: 28-Feb-1956           MRN: [id number] Visit Date: 02/10/2019 PCP: Lucille Passy, MD   Assessment & Plan:  Chief Complaint:  Chief Complaint  Patient presents with  . Right Shoulder - Routine Post Op    02/01/2019 Right Shoulder Arthroscopy   Visit Diagnoses:  1. Nontraumatic complete tear of right rotator cuff     Plan: [name] is a 63 year old patient with rotator cuff tear repair done a week ago.  On exam she has good passive range of motion.  She is in a CPM machine 3 hours a day and is up to 67 degrees.  Plan at this time is to continue CPM machine.  Come back in 2 weeks.  Sutures DC'd today.  DC sling in 1 week but then still no lifting with that right arm.  She is off pain medicine.  Follow-Up Instructions: Return in about 2 weeks (around 02/24/2019).   Orders:  No orders of the defined types were placed in this encounter.  No orders of the defined types were placed in this encounter.   Imaging: No results found.  PMFS History: Patient Active Problem List   Diagnosis Date Noted  . Elevated blood pressure reading without diagnosis of hypertension 01/13/2019  . Vaginal atrophy 12/15/2018  . Dyspnea on exertion 10/21/2018  . Family history of early CAD 07/08/2018  . Allergic rhinitis 06/25/2018  . Breast mass, right 05/25/2018  . Varicose veins of bilateral lower extremities with pain 06/09/2017  . Menopausal symptoms 02/16/2015  . Vitamin B 12 deficiency 04/11/2014  . Stress incontinence 08/31/2013  . Cervico-occipital neuralgia 06/01/2013  . Acute sinus infection 03/30/2013  . HLD (hyperlipidemia) 03/09/2012  . Migraine 12/31/2011  . Anxiety 12/31/2011  . Unspecified mood (affective) disorder ([place]) 12/31/2011  . Breast cancer ([place]) 12/31/2011   Past Medical History:  Diagnosis Date  . Anemia   . Anxiety   . Blood transfusion without reported diagnosis   . Breast cancer ([place]) 02/27/2007    Right  . GERD (gastroesophageal reflux disease)   . Hypercholesterolemia   . Migraine   . Personal history of radiation therapy 2008   Right  . Wears dentures    partial upper    Family History  Problem Relation Age of Onset  . Thyroid disease Mother   . Hypertension Mother   . Hyperlipidemia Mother   . Alzheimer's disease Mother   . Heart failure Mother   . Esophageal cancer Father   . Hyperlipidemia Father   . Heart murmur Father   . Colon polyps Father   . Diabetes Sister   . Alzheimer's disease Maternal Aunt   . Alzheimer's disease Maternal Uncle   . Diabetes Paternal Uncle   . Alzheimer's disease Maternal Grandmother   . Diabetes Paternal Grandmother   . Breast cancer Sister   . Irritable bowel syndrome Sister   . Ulcerative colitis Sister        ????  . Breast cancer Cousin   . Breast cancer Cousin   . Colon cancer Neg Hx     Past Surgical History:  Procedure Laterality Date  . ABDOMINAL HYSTERECTOMY    . BREAST BIOPSY Right 02/27/2007  . BREAST LUMPECTOMY Right 04/02/2007  . BREAST SURGERY  01/14/2013   breast reduction left side  . BROW LIFT Bilateral 06/17/2017   Procedure: BLEPHAROPLASTY  UPPER EYELID WITH EXCESS SKIN;  Surgeon: Karle Starch, MD;  Location: [facility];  Service: Ophthalmology;  Laterality: Bilateral;  . CESAREAN SECTION     two times  . COLONOSCOPY    . FOOT SURGERY Right   . PTOSIS REPAIR Bilateral 06/17/2017   Procedure: PTOSIS REPAIR RESECT EX;  Surgeon: Karle Starch, MD;  Location: [facility];  Service: Ophthalmology;  Laterality: Bilateral;  . REDUCTION MAMMAPLASTY Left   . SHOULDER ARTHROSCOPY WITH ROTATOR CUFF REPAIR AND OPEN BICEPS TENODESIS Right 02/01/2019   Procedure: RIGHT SHOULDER ARTHROSCOPY, DEBRIDEMENT, BICEPS TENODESIS, MINI ROTATOR CUFF TEAR REPAIR;  Surgeon: Meredith Pel, MD;  Location: [facility];  Service: Orthopedics;  Laterality: Right;   Social History   Occupational  History  . Not on file  Tobacco Use  . Smoking status: Never Smoker  . Smokeless tobacco: Never Used  Substance and Sexual Activity  . Alcohol use: No    Alcohol/week: 0.0 standard drinks  . Drug use: No  . Sexual activity: Yes    Partners: Male    Birth control/protection: Surgical

## 2019-02-14 ENCOUNTER — Telehealth: Payer: Self-pay

## 2019-02-14 DIAGNOSIS — Z20822 Contact with and (suspected) exposure to covid-19: Secondary | ICD-10-CM

## 2019-02-14 NOTE — Telephone Encounter (Signed)
Spoke with the patient about possible Covid-19 exposure at 02/10/19 office visit to [facility]. RN explained the free Covid-19 screening offered for this exposure. Notified that she and her driver should wear a masks and the drive-up screening will take place at the [facility], [place], 02/15/19 @ 0900

## 2019-02-14 NOTE — Addendum Note (Signed)
Addended by: Lorne Skeens D on: 02/14/2019 04:09 PM   Modules accepted: Orders

## 2019-02-14 NOTE — Telephone Encounter (Signed)
Unable to contact patient. 2xVM left for patient to call [facility] office number and hours provided. Explained this is in regards to additional labs being needed for potential covid exposure at [facility]. UTA s/sx following covid exposure.

## 2019-02-15 ENCOUNTER — Other Ambulatory Visit: Payer: PRIVATE HEALTH INSURANCE

## 2019-02-15 DIAGNOSIS — Z20822 Contact with and (suspected) exposure to covid-19: Secondary | ICD-10-CM

## 2019-02-16 LAB — NOVEL CORONAVIRUS, NAA: SARS-CoV-2, NAA: NOT DETECTED

## 2019-02-24 ENCOUNTER — Encounter: Payer: Self-pay | Admitting: Orthopedic Surgery

## 2019-02-24 ENCOUNTER — Ambulatory Visit (INDEPENDENT_AMBULATORY_CARE_PROVIDER_SITE_OTHER): Payer: PRIVATE HEALTH INSURANCE | Admitting: Orthopedic Surgery

## 2019-02-24 ENCOUNTER — Other Ambulatory Visit: Payer: Self-pay

## 2019-02-24 DIAGNOSIS — M75121 Complete rotator cuff tear or rupture of right shoulder, not specified as traumatic: Secondary | ICD-10-CM

## 2019-02-24 NOTE — Progress Notes (Signed)
Post-Op Visit Note   Patient: [name]           Date of Birth: 1956-06-03           MRN: [id number] Visit Date: 02/24/2019 PCP: Lucille Passy, MD   Assessment & Plan:  Chief Complaint:  Chief Complaint  Patient presents with  . Right Shoulder - Follow-up   Visit Diagnoses:  1. Nontraumatic complete tear of right rotator cuff     Plan: Trezure is a patient who is now about 3 weeks out right shoulder arthroscopy with biceps tendon debridement mini open rotator cuff repair.  She is at 110 on CPM machine.  Taking oxycodone but only very infrequently.  On exam she has good passive range of motion and no real coarseness or grinding.  Incisions look intact.  Potentially small suture tip trying to poke through but difficult to see and not 100% convinced that she has that going on.  No erythema or redness around the incision.  I will get her started [facility] and do the CPM machine through the weekend.  Come back in 3 weeks and will start strengthening then.  Follow-Up Instructions: Return in about 3 weeks (around 03/17/2019).   Orders:  Orders Placed This Encounter  Procedures  . Ambulatory referral to Physical Therapy   No orders of the defined types were placed in this encounter.   Imaging: No results found.  PMFS History: Patient Active Problem List   Diagnosis Date Noted  . Elevated blood pressure reading without diagnosis of hypertension 01/13/2019  . Vaginal atrophy 12/15/2018  . Dyspnea on exertion 10/21/2018  . Family history of early CAD 07/08/2018  . Allergic rhinitis 06/25/2018  . Breast mass, right 05/25/2018  . Varicose veins of bilateral lower extremities with pain 06/09/2017  . Menopausal symptoms 02/16/2015  . Vitamin B 12 deficiency 04/11/2014  . Stress incontinence 08/31/2013  . Cervico-occipital neuralgia 06/01/2013  . Acute sinus infection 03/30/2013  . HLD (hyperlipidemia) 03/09/2012  . Migraine 12/31/2011  . Anxiety 12/31/2011   . Unspecified mood (affective) disorder ([place]) 12/31/2011  . Breast cancer ([place]) 12/31/2011   Past Medical History:  Diagnosis Date  . Anemia   . Anxiety   . Blood transfusion without reported diagnosis   . Breast cancer ([place]) 02/27/2007   Right  . GERD (gastroesophageal reflux disease)   . Hypercholesterolemia   . Migraine   . Personal history of radiation therapy 2008   Right  . Wears dentures    partial upper    Family History  Problem Relation Age of Onset  . Thyroid disease Mother   . Hypertension Mother   . Hyperlipidemia Mother   . Alzheimer's disease Mother   . Heart failure Mother   . Esophageal cancer Father   . Hyperlipidemia Father   . Heart murmur Father   . Colon polyps Father   . Diabetes Sister   . Alzheimer's disease Maternal Aunt   . Alzheimer's disease Maternal Uncle   . Diabetes Paternal Uncle   . Alzheimer's disease Maternal Grandmother   . Diabetes Paternal Grandmother   . Breast cancer Sister   . Irritable bowel syndrome Sister   . Ulcerative colitis Sister        ????  . Breast cancer Cousin   . Breast cancer Cousin   . Colon cancer Neg Hx     Past Surgical History:  Procedure Laterality Date  . ABDOMINAL HYSTERECTOMY    . BREAST BIOPSY Right  02/27/2007  . BREAST LUMPECTOMY Right 04/02/2007  . BREAST SURGERY  01/14/2013   breast reduction left side  . BROW LIFT Bilateral 06/17/2017   Procedure: BLEPHAROPLASTY UPPER EYELID WITH EXCESS SKIN;  Surgeon: Karle Starch, MD;  Location: [facility];  Service: Ophthalmology;  Laterality: Bilateral;  . CESAREAN SECTION     two times  . COLONOSCOPY    . FOOT SURGERY Right   . PTOSIS REPAIR Bilateral 06/17/2017   Procedure: PTOSIS REPAIR RESECT EX;  Surgeon: Karle Starch, MD;  Location: [facility];  Service: Ophthalmology;  Laterality: Bilateral;  . REDUCTION MAMMAPLASTY Left   . SHOULDER ARTHROSCOPY WITH ROTATOR CUFF REPAIR AND OPEN BICEPS TENODESIS Right 02/01/2019   Procedure:  RIGHT SHOULDER ARTHROSCOPY, DEBRIDEMENT, BICEPS TENODESIS, MINI ROTATOR CUFF TEAR REPAIR;  Surgeon: Meredith Pel, MD;  Location: [facility];  Service: Orthopedics;  Laterality: Right;   Social History   Occupational History  . Not on file  Tobacco Use  . Smoking status: Never Smoker  . Smokeless tobacco: Never Used  Substance and Sexual Activity  . Alcohol use: No    Alcohol/week: 0.0 standard drinks  . Drug use: No  . Sexual activity: Yes    Partners: Male    Birth control/protection: Surgical

## 2019-03-08 ENCOUNTER — Encounter: Payer: Self-pay | Admitting: Family Medicine

## 2019-03-08 MED ORDER — ESCITALOPRAM OXALATE 20 MG PO TABS: 20.0000 mg | ORAL_TABLET | Freq: Every day | ORAL | 0 refills | Status: DC

## 2019-03-15 ENCOUNTER — Ambulatory Visit: Payer: PRIVATE HEALTH INSURANCE | Attending: Orthopedic Surgery | Admitting: Physical Therapy

## 2019-03-15 ENCOUNTER — Other Ambulatory Visit: Payer: Self-pay

## 2019-03-15 ENCOUNTER — Encounter: Payer: Self-pay | Admitting: Physical Therapy

## 2019-03-15 DIAGNOSIS — M6281 Muscle weakness (generalized): Secondary | ICD-10-CM | POA: Diagnosis present

## 2019-03-15 DIAGNOSIS — M25611 Stiffness of right shoulder, not elsewhere classified: Secondary | ICD-10-CM

## 2019-03-15 DIAGNOSIS — M25511 Pain in right shoulder: Secondary | ICD-10-CM | POA: Insufficient documentation

## 2019-03-15 NOTE — Therapy (Signed)
[facility] [address]. [place], [place], [address] Phone: [phone]   Fax:  [phone]  Physical Therapy Evaluation  Patient Details  Name: [name] MRN: [id number] Date of Birth: 01/09/1956 Referring Provider (PT): Marcene Duos MD   Encounter Date: 03/15/2019  PT End of Session - 03/15/19 0808    Visit Number  1    Number of Visits  9    Date for PT Re-Evaluation  04/12/19    PT Start Time  0803    PT Stop Time  0853    PT Time Calculation (min)  50 min    Activity Tolerance  Patient tolerated treatment well;Patient limited by pain    Behavior During Therapy  [facility] for tasks assessed/performed       Past Medical History:  Diagnosis Date  . Anemia   . Anxiety   . Blood transfusion without reported diagnosis   . Breast cancer ([place]) 02/27/2007   Right  . GERD (gastroesophageal reflux disease)   . Hypercholesterolemia   . Migraine   . Personal history of radiation therapy 2008   Right  . Wears dentures    partial upper    Past Surgical History:  Procedure Laterality Date  . ABDOMINAL HYSTERECTOMY    . BREAST BIOPSY Right 02/27/2007  . BREAST LUMPECTOMY Right 04/02/2007  . BREAST SURGERY  01/14/2013   breast reduction left side  . BROW LIFT Bilateral 06/17/2017   Procedure: BLEPHAROPLASTY UPPER EYELID WITH EXCESS SKIN;  Surgeon: Karle Starch, MD;  Location: [place];  Service: Ophthalmology;  Laterality: Bilateral;  . CESAREAN SECTION     two times  . COLONOSCOPY    . FOOT SURGERY Right   . PTOSIS REPAIR Bilateral 06/17/2017   Procedure: PTOSIS REPAIR RESECT EX;  Surgeon: Karle Starch, MD;  Location: [place];  Service: Ophthalmology;  Laterality: Bilateral;  . REDUCTION MAMMAPLASTY Left   . SHOULDER ARTHROSCOPY WITH ROTATOR CUFF REPAIR AND OPEN BICEPS TENODESIS Right 02/01/2019   Procedure: RIGHT SHOULDER ARTHROSCOPY, DEBRIDEMENT, BICEPS TENODESIS, MINI ROTATOR CUFF TEAR REPAIR;   Surgeon: Meredith Pel, MD;  Location: [place];  Service: Orthopedics;  Laterality: Right;    There were no vitals filed for this visit.    [facility] PT Assessment - 03/15/19 6010      Assessment   Medical Diagnosis  s/p R shoulder rotator cuff repair    Referring Provider (PT)  Marcene Duos MD    Onset Date/Surgical Date  02/01/19    Hand Dominance  Right    Next MD Visit  03/17/2019    Prior Therapy  HHPT, CPM      Precautions   Precautions  Shoulder      Balance Screen   Has the patient fallen in the past 6 months  Yes    How many times?  1      Prior Function   Level of Independence  Independent        SUBJECTIVE  Chief complaint: Patient had stiffness and decreased ROM for greater than a year; was treated pharmaceutically and then began the process of pursuing surgical correction for torn RTC, scarring of biceps tendon. Patient has had increased swelling in the trapezius/clavicular area at the end of the day which is also when she has increased pain. Onset: Surgical date 02/01/2019 Shoulder trauma: 3 years ago to present, patient has had 2 hard falls on the R side/shoulder Pain quality: aching,  hurt Pain: 0/10 Present, 0/10 Best, 10/10 Worst Aggravating factors: disuse Easing factors: gentle movement 24 hour pain behavior: increases over the course of th day depending on activity/inactivity Radiating pain: subscapular referral pain pattern Numbness/Tingling: None Dominant hand: Right  OBJECTIVE  MUSCULOSKELETAL: Tremor: Normal Bulk: Normal Tone: Normal  Palpation No pain with palpation to anterior, posterior, lateral, and superior shoulder. Incision site without erythema nor ecchymosis. Patient has some mild swelling superior to clavicle.   Strength R/L deferred/4 Shoulder flexion (anterior deltoid/pec major/coracobrachialis, axillary/medial and lateral pectoral/musculocutaneous n) deferred/4 Shoulder abduction (deltoid/supraspinatus,  axillary/suprascapular n, C5) deferred/5 Shoulder external rotation (infraspinatus/teres minor) deferred/5 Shoulder internal rotation (subcapularis/lats/pec major) deferred/5 Shoulder extension (posterior deltoid, lats, teres major/minor, axillary/thoracodorsal n.) 5/5 Elbow flexion (biceps brachii, brachialis, brachioradialis, musculoskeletal n, C5-6) 3+/5 Elbow extension (triceps, radial n, C7) 5/5 Wrist Extension 5/5 Wrist Flexion 5/5 Finger adduction (interossei, ulnar n, T1)   PROM R degrees 147 Shoulder flexion 84 Shoulder abduction 46 Shoulder external rotation* 58 Shoulder internal rotation *Indicates Pain  Accessory Motion Deferred on this date.  NEUROLOGICAL:  Mental Status Patient is oriented to person, place and time.  Recent memory is intact.  Remote memory is intact.  Attention span and concentration are intact.  Expressive speech is intact.  Patient's fund of knowledge is within normal limits for educational level.  SENSATION: Grossly intact to light touch bilateral UE as determined by testing dermatomes C2-T2 Proprioception and hot/cold testing deferred on this date  Patient educated on prognosis, POC, and provided with HEP including: elbow strengthening, scapular retractions, pendulums. Patient articulated understanding and returned demonstration. Patient will benefit from further education in order to maximize compliance and understanding for long-term therapeutic gains.    ASSESSMENT Patient is a 63 year old presenting to clinic with chief complaints of pain and limited function commensurate with s/p R shoulder arthroscopic debridement and RTC repair. Upon examination, patient demonstrates deficits in RUE AROM, strength, function as appropriate for current post-surgical stage; patient does have notable increased edema superior to the clavicle on the R which she reports is worse in the evenings. Patient presents with exceptional PROM and limited pain.  Patient's responses on FOTO (Quick Dash) outcome measures (56) indicate moderate functional limitations/disability/distress. Patient's progress may be limited due to patient's highly active lifestyle; however, patient's motivation and prior compliance with HEP is advantageous. Patient was able to achieve 147 degrees PROM R GH flexion during today's evaluation and responded positively to educational interventions. Patient will benefit from continued skilled therapeutic intervention to address deficits in RUE AROM, strength, function in order to return to full participation in activities, increase function, and improve overall QOL.     Objective measurements completed on examination: See above findings.   TREATMENT  Manual Therapy: STM for improved lymphatic drainage on RUE  Patient educated on how to perform on self at home.    PT Long Term Goals - 03/15/19 1335      PT LONG TERM GOAL #1   Title  Patient will be independent with HEP in order to improve strength and decrease pain in order to improve pain-free function at home and work.    Baseline  IE: provided    Time  4    Period  Weeks    Status  New    Target Date  04/12/19      PT LONG TERM GOAL #2   Title  Patient will decrease worst pain as reported on NPRS by at least 3 points in order to demonstrate clinically  significant reduction in pain.    Baseline  IE: 10/10    Time  4    Period  Weeks    Target Date  04/12/19      PT LONG TERM GOAL #3   Title  Patient will indicate a score of 71 or greater on FOTO (Quick Dash) to demonstrate clinically significant improvement in function of RUE for improved independence.    Baseline  IE: 56    Time  4    Period  Weeks    Status  New    Target Date  04/12/19      PT LONG TERM GOAL #4   Title  Patient will demonstrate AROM of R shoulder WFLs (abduction> 120, flexion > 160, IR >65, ER> 65) in order to demonstrate ability to return to participation in [place] activities without  limitation.    Baseline  IE: flexion 147, abduction 84, IR 58, ER 46    Time  4    Period  Weeks    Status  New    Target Date  04/12/19             Plan - 03/15/19 0811    Clinical Impression Statement  Patient is a 63 year old presenting to clinic with chief complaints of pain and limited function commensurate with s/p R shoulder arthroscopic debridement and RTC repair. Upon examination, patient demonstrates deficits in RUE AROM, strength, function as appropriate for current post-surgical stage; patient does have notable increased edema superior to the clavicle on the R which she reports is worse in the evenings. Patient presents with exceptional PROM and limited pain. Patient's responses on FOTO (Quick Dash) outcome measures (56) indicate moderate functional limitations/disability/distress. Patient's progress may be limited due to patient's highly active lifestyle; however, patient's motivation and prior compliance with HEP is advantageous. Patient was able to achieve 147 degrees PROM R GH flexion during today's evaluation and responded positively to educational interventions. Patient will benefit from continued skilled therapeutic intervention to address deficits in RUE AROM, strength, function in order to return to full participation in activities, increase function, and improve overall QOL.    Personal Factors and Comorbidities  Age;Sex;Behavior Pattern;Fitness;Past/Current Experience;Time since onset of injury/illness/exacerbation;Comorbidity 3+    Comorbidities  anemia, anxiety, hx of R breast cancer, migraines    Examination-Activity Limitations  Bathing;Hygiene/Grooming;Lift;Reach Overhead;Carry;Dressing;Sleep    Examination-Participation Restrictions  Laundry;Shop;Cleaning;Community Activity;Yard Work    [employer]  Evolving/Moderate complexity    Clinical Decision Making  Moderate    Rehab Potential  Good    PT Frequency  2x / week    PT Duration  4 weeks     PT Treatment/Interventions  ADLs/Self Care Home Management;Aquatic Therapy;Cryotherapy;Electrical Stimulation;Moist Heat;Therapeutic activities;Therapeutic exercise;Balance training;Stair training;Gait training;Functional mobility training;Neuromuscular re-education;Patient/family education;Manual techniques;Scar mobilization;Passive range of motion;Dry needling;Taping;Spinal Manipulations;Joint Manipulations    Consulted and Agree with Plan of Care  Patient       Patient will benefit from skilled therapeutic intervention in order to improve the following deficits and impairments:  Decreased mobility, Hypomobility, Increased muscle spasms, Improper body mechanics, Decreased scar mobility, Decreased activity tolerance, Decreased strength, Impaired flexibility, Impaired UE functional use, Postural dysfunction, Pain  Visit Diagnosis: 1. Right shoulder pain, unspecified chronicity   2. Muscle weakness (generalized)   3. Decreased ROM of right shoulder        Problem List Patient Active Problem List   Diagnosis Date Noted  . Elevated blood pressure reading without diagnosis of hypertension 01/13/2019  . Vaginal  atrophy 12/15/2018  . Dyspnea on exertion 10/21/2018  . Family history of early CAD 07/08/2018  . Allergic rhinitis 06/25/2018  . Breast mass, right 05/25/2018  . Varicose veins of bilateral lower extremities with pain 06/09/2017  . Menopausal symptoms 02/16/2015  . Vitamin B 12 deficiency 04/11/2014  . Stress incontinence 08/31/2013  . Cervico-occipital neuralgia 06/01/2013  . Acute sinus infection 03/30/2013  . HLD (hyperlipidemia) 03/09/2012  . Migraine 12/31/2011  . Anxiety 12/31/2011  . Unspecified mood (affective) disorder ([place]) 12/31/2011  . Breast cancer [facility]) 12/31/2011   Myles Gip PT, DPT [phone] 03/15/2019, 1:44 PM  [facility] [address] [place], [place], [address] Phone: [phone]   Fax:   [phone]  Name: [name] MRN: [id number] Date of Birth: [birth year]

## 2019-03-17 ENCOUNTER — Encounter: Payer: Self-pay | Admitting: Orthopedic Surgery

## 2019-03-17 ENCOUNTER — Other Ambulatory Visit: Payer: Self-pay

## 2019-03-17 ENCOUNTER — Ambulatory Visit (INDEPENDENT_AMBULATORY_CARE_PROVIDER_SITE_OTHER): Payer: PRIVATE HEALTH INSURANCE | Admitting: Orthopedic Surgery

## 2019-03-17 DIAGNOSIS — M75121 Complete rotator cuff tear or rupture of right shoulder, not specified as traumatic: Secondary | ICD-10-CM

## 2019-03-17 NOTE — Progress Notes (Signed)
Post-Op Visit Note   Patient: [name]           Date of Birth: 05-10-1956           MRN: [id number] Visit Date: 03/17/2019 PCP: Lucille Passy, MD   Assessment & Plan:  Chief Complaint:  Chief Complaint  Patient presents with  . Right Shoulder - Follow-up   Visit Diagnoses:  1. Nontraumatic complete tear of right rotator cuff     Plan: Chianne is now 6 weeks out right shoulder rotator cuff tear repair.  She has been doing well.  On exam she has excellent passive range of motion and very good strength.  I would continue her in physical therapy adding some strengthening exercises.  See her back in about 4 weeks for final check.  Follow-Up Instructions: Return in about 4 weeks (around 04/14/2019).   Orders:  No orders of the defined types were placed in this encounter.  No orders of the defined types were placed in this encounter.   Imaging: No results found.  PMFS History: Patient Active Problem List   Diagnosis Date Noted  . Elevated blood pressure reading without diagnosis of hypertension 01/13/2019  . Vaginal atrophy 12/15/2018  . Dyspnea on exertion 10/21/2018  . Family history of early CAD 07/08/2018  . Allergic rhinitis 06/25/2018  . Breast mass, right 05/25/2018  . Varicose veins of bilateral lower extremities with pain 06/09/2017  . Menopausal symptoms 02/16/2015  . Vitamin B 12 deficiency 04/11/2014  . Stress incontinence 08/31/2013  . Cervico-occipital neuralgia 06/01/2013  . Acute sinus infection 03/30/2013  . HLD (hyperlipidemia) 03/09/2012  . Migraine 12/31/2011  . Anxiety 12/31/2011  . Unspecified mood (affective) disorder ([place]) 12/31/2011  . Breast cancer ([place]) 12/31/2011   Past Medical History:  Diagnosis Date  . Anemia   . Anxiety   . Blood transfusion without reported diagnosis   . Breast cancer ([place]) 02/27/2007   Right  . GERD (gastroesophageal reflux disease)   . Hypercholesterolemia   . Migraine   . Personal history of radiation  therapy 2008   Right  . Wears dentures    partial upper    Family History  Problem Relation Age of Onset  . Thyroid disease Mother   . Hypertension Mother   . Hyperlipidemia Mother   . Alzheimer's disease Mother   . Heart failure Mother   . Esophageal cancer Father   . Hyperlipidemia Father   . Heart murmur Father   . Colon polyps Father   . Diabetes Sister   . Alzheimer's disease Maternal Aunt   . Alzheimer's disease Maternal Uncle   . Diabetes Paternal Uncle   . Alzheimer's disease Maternal Grandmother   . Diabetes Paternal Grandmother   . Breast cancer Sister   . Irritable bowel syndrome Sister   . Ulcerative colitis Sister        ????  . Breast cancer Cousin   . Breast cancer Cousin   . Colon cancer Neg Hx     Past Surgical History:  Procedure Laterality Date  . ABDOMINAL HYSTERECTOMY    . BREAST BIOPSY Right 02/27/2007  . BREAST LUMPECTOMY Right 04/02/2007  . BREAST SURGERY  01/14/2013   breast reduction left side  . BROW LIFT Bilateral 06/17/2017   Procedure: BLEPHAROPLASTY UPPER EYELID WITH EXCESS SKIN;  Surgeon: Karle Starch, MD;  Location: [place];  Service: Ophthalmology;  Laterality: Bilateral;  . CESAREAN SECTION     two times  . COLONOSCOPY    .  FOOT SURGERY Right   . PTOSIS REPAIR Bilateral 06/17/2017   Procedure: PTOSIS REPAIR RESECT EX;  Surgeon: Karle Starch, MD;  Location: [place];  Service: Ophthalmology;  Laterality: Bilateral;  . REDUCTION MAMMAPLASTY Left   . SHOULDER ARTHROSCOPY WITH ROTATOR CUFF REPAIR AND OPEN BICEPS TENODESIS Right 02/01/2019   Procedure: RIGHT SHOULDER ARTHROSCOPY, DEBRIDEMENT, BICEPS TENODESIS, MINI ROTATOR CUFF TEAR REPAIR;  Surgeon: Meredith Pel, MD;  Location: [place];  Service: Orthopedics;  Laterality: Right;   Social History   Occupational History  . Not on file  Tobacco Use  . Smoking status: Never Smoker  . Smokeless tobacco: Never Used  Substance and Sexual  Activity  . Alcohol use: No    Alcohol/week: 0.0 standard drinks  . Drug use: No  . Sexual activity: Yes    Partners: Male    Birth control/protection: Surgical

## 2019-03-18 ENCOUNTER — Ambulatory Visit: Payer: PRIVATE HEALTH INSURANCE | Attending: Orthopedic Surgery | Admitting: Physical Therapy

## 2019-03-18 ENCOUNTER — Encounter: Payer: Self-pay | Admitting: Physical Therapy

## 2019-03-18 DIAGNOSIS — M6281 Muscle weakness (generalized): Secondary | ICD-10-CM | POA: Diagnosis present

## 2019-03-18 DIAGNOSIS — M25511 Pain in right shoulder: Secondary | ICD-10-CM

## 2019-03-18 DIAGNOSIS — M25611 Stiffness of right shoulder, not elsewhere classified: Secondary | ICD-10-CM

## 2019-03-18 NOTE — Therapy (Signed)
[facility] [address] [place], [place], [address] Phone: [phone]   Fax:  [phone]  Physical Therapy Treatment  Patient Details  Name: [name] MRN: [id number] Date of Birth: March 14, 1956 Referring Provider (PT): Marcene Duos MD   Encounter Date: 03/18/2019  PT End of Session - 03/18/19 1405    Visit Number  2    Number of Visits  9    Date for PT Re-Evaluation  04/12/19    PT Start Time  7673    PT Stop Time  1452    PT Time Calculation (min)  54 min    Activity Tolerance  Patient tolerated treatment well    Behavior During Therapy  [facility] for tasks assessed/performed       Past Medical History:  Diagnosis Date  . Anemia   . Anxiety   . Blood transfusion without reported diagnosis   . Breast cancer ([place]) 02/27/2007   Right  . GERD (gastroesophageal reflux disease)   . Hypercholesterolemia   . Migraine   . Personal history of radiation therapy 2008   Right  . Wears dentures    partial upper    Past Surgical History:  Procedure Laterality Date  . ABDOMINAL HYSTERECTOMY    . BREAST BIOPSY Right 02/27/2007  . BREAST LUMPECTOMY Right 04/02/2007  . BREAST SURGERY  01/14/2013   breast reduction left side  . BROW LIFT Bilateral 06/17/2017   Procedure: BLEPHAROPLASTY UPPER EYELID WITH EXCESS SKIN;  Surgeon: Karle Starch, MD;  Location: [place];  Service: Ophthalmology;  Laterality: Bilateral;  . CESAREAN SECTION     two times  . COLONOSCOPY    . FOOT SURGERY Right   . PTOSIS REPAIR Bilateral 06/17/2017   Procedure: PTOSIS REPAIR RESECT EX;  Surgeon: Karle Starch, MD;  Location: [place];  Service: Ophthalmology;  Laterality: Bilateral;  . REDUCTION MAMMAPLASTY Left   . SHOULDER ARTHROSCOPY WITH ROTATOR CUFF REPAIR AND OPEN BICEPS TENODESIS Right 02/01/2019   Procedure: RIGHT SHOULDER ARTHROSCOPY, DEBRIDEMENT, BICEPS TENODESIS, MINI ROTATOR CUFF TEAR REPAIR;  Surgeon: Meredith Pel,  MD;  Location: [place];  Service: Orthopedics;  Laterality: Right;    There were no vitals filed for this visit.  Subjective Assessment - 03/18/19 1400    Subjective  Patient states that she had a good visit with her MD and has been cleared to strengthen. She expressed some hesitation about strengthening for fear of pain but it is eager to particpate.    Currently in Pain?  Yes    Pain Score  2     Pain Location  Shoulder    Pain Orientation  Posterior;Lateral    Pain Descriptors / Indicators  Aching;Discomfort      TREATMENT  Therapeutic Exercise: AAROM pulleys, flexion 4 min AAROM, wand: flexion x30   : abduction x30   : ER x30 Supine AROM : flexion to 30 degrees 2x10   : abduction 2x10   : ER/IR 2x10 Isometrics (elbow at 90, shoulder at 0): flexion 5 sec hold x10 x2        : extension 5 sec hold x10 x2         : ER 5 sec hold x10 x2          : IR 5 sec hold x10 x2  Patient educated throughout session on appropriate technique and form using multi-modal cueing, HEP, and activity modification. Patient articulated understanding and returned demonstration.  Patient Response to interventions: Patient denies any increased pain.   ASSESSMENT Patient presents to clinic with excellent motivation to participate in therapy. Patient demonstrates deficits in R shoulder AROM, strength, function as evidenced by increased pain with active flexion to ~30 degrees in supine. Patient able to achieve appropriate form for shoulder isometrics at 0 degrees of flexion and abduction during today's session and responded positively to active interventions. Patient will benefit from continued skilled therapeutic intervention to address remaining deficits in R shoulder AROM, strength, function in order to return to full participation in activities, increase function, and improve overall QOL.       PT Long Term Goals - 03/15/19 1335      PT LONG TERM GOAL #1   Title  Patient will be  independent with HEP in order to improve strength and decrease pain in order to improve pain-free function at home and work.    Baseline  IE: provided    Time  4    Period  Weeks    Status  New    Target Date  04/12/19      PT LONG TERM GOAL #2   Title  Patient will decrease worst pain as reported on NPRS by at least 3 points in order to demonstrate clinically significant reduction in pain.    Baseline  IE: 10/10    Time  4    Period  Weeks    Target Date  04/12/19      PT LONG TERM GOAL #3   Title  Patient will indicate a score of 71 or greater on FOTO (Quick Dash) to demonstrate clinically significant improvement in function of RUE for improved independence.    Baseline  IE: 56    Time  4    Period  Weeks    Status  New    Target Date  04/12/19      PT LONG TERM GOAL #4   Title  Patient will demonstrate AROM of R shoulder WFLs (abduction> 120, flexion > 160, IR >65, ER> 65) in order to demonstrate ability to return to participation in [place] activities without limitation.    Baseline  IE: flexion 147, abduction 84, IR 58, ER 46    Time  4    Period  Weeks    Status  New    Target Date  04/12/19            Plan - 03/18/19 1406    Clinical Impression Statement  Patient presents to clinic with excellent motivation to participate in therapy. Patient demonstrates deficits in R shoulder AROM, strength, function as evidenced by increased pain with active flexion to ~30 degrees in supine. Patient able to achieve appropriate form for shoulder isometrics at 0 degrees of flexion and abduction during today's session and responded positively to active interventions. Patient will benefit from continued skilled therapeutic intervention to address remaining deficits in R shoulder AROM, strength, function in order to return to full participation in activities, increase function, and improve overall QOL.    Personal Factors and Comorbidities  Age;Sex;Behavior Pattern;Fitness;Past/Current  Experience;Time since onset of injury/illness/exacerbation;Comorbidity 3+    Comorbidities  anemia, anxiety, hx of R breast cancer, migraines    Examination-Activity Limitations  Bathing;Hygiene/Grooming;Lift;Reach Overhead;Carry;Dressing;Sleep    Examination-Participation Restrictions  Laundry;Shop;Cleaning;Community Activity;Yard Work    Stability/Clinical Decision Making  Evolving/Moderate complexity    Rehab Potential  Good    PT Frequency  2x / week    PT Duration  4 weeks  PT Treatment/Interventions  ADLs/Self Care Home Management;Aquatic Therapy;Cryotherapy;Electrical Stimulation;Moist Heat;Therapeutic activities;Therapeutic exercise;Balance training;Stair training;Gait training;Functional mobility training;Neuromuscular re-education;Patient/family education;Manual techniques;Scar mobilization;Passive range of motion;Dry needling;Taping;Spinal Manipulations;Joint Manipulations    Consulted and Agree with Plan of Care  Patient       Patient will benefit from skilled therapeutic intervention in order to improve the following deficits and impairments:  Decreased mobility, Hypomobility, Increased muscle spasms, Improper body mechanics, Decreased scar mobility, Decreased activity tolerance, Decreased strength, Impaired flexibility, Impaired UE functional use, Postural dysfunction, Pain  Visit Diagnosis: 1. Right shoulder pain, unspecified chronicity   2. Muscle weakness (generalized)   3. Decreased ROM of right shoulder        Problem List Patient Active Problem List   Diagnosis Date Noted  . Elevated blood pressure reading without diagnosis of hypertension 01/13/2019  . Vaginal atrophy 12/15/2018  . Dyspnea on exertion 10/21/2018  . Family history of early CAD 07/08/2018  . Allergic rhinitis 06/25/2018  . Breast mass, right 05/25/2018  . Varicose veins of bilateral lower extremities with pain 06/09/2017  . Menopausal symptoms 02/16/2015  . Vitamin B 12 deficiency 04/11/2014   . Stress incontinence 08/31/2013  . Cervico-occipital neuralgia 06/01/2013  . Acute sinus infection 03/30/2013  . HLD (hyperlipidemia) 03/09/2012  . Migraine 12/31/2011  . Anxiety 12/31/2011  . Unspecified mood (affective) disorder ([place]) 12/31/2011  . Breast cancer ([place]) 12/31/2011   Myles Gip PT, DPT [phone] 03/18/2019, 3:04 PM  [facility] [address] [place], [place], [address] Phone: [phone]   Fax:  [phone]  Name: ARAMIS ZOBEL MRN: [id number] Date of Birth: 1956/02/05

## 2019-03-22 ENCOUNTER — Ambulatory Visit: Payer: PRIVATE HEALTH INSURANCE | Admitting: Physical Therapy

## 2019-03-22 ENCOUNTER — Encounter: Payer: Self-pay | Admitting: Physical Therapy

## 2019-03-22 ENCOUNTER — Other Ambulatory Visit: Payer: Self-pay

## 2019-03-22 DIAGNOSIS — M25511 Pain in right shoulder: Secondary | ICD-10-CM

## 2019-03-22 DIAGNOSIS — M25611 Stiffness of right shoulder, not elsewhere classified: Secondary | ICD-10-CM

## 2019-03-22 DIAGNOSIS — M6281 Muscle weakness (generalized): Secondary | ICD-10-CM

## 2019-03-22 NOTE — Therapy (Signed)
[facility] [address] [place], [place], [address] Phone: [phone]   Fax:  [phone]  Physical Therapy Treatment  Patient Details  Name: [name] MRN: [id number] Date of Birth: June 12, 1956 Referring Provider (PT): Marcene Duos MD   Encounter Date: 03/22/2019  PT End of Session - 03/22/19 0810    Visit Number  3    Number of Visits  9    Date for PT Re-Evaluation  04/12/19    PT Start Time  0759    PT Stop Time  0844    PT Time Calculation (min)  45 min    Activity Tolerance  Patient tolerated treatment well    Behavior During Therapy  [facility] for tasks assessed/performed       Past Medical History:  Diagnosis Date  . Anemia   . Anxiety   . Blood transfusion without reported diagnosis   . Breast cancer ([place]) 02/27/2007   Right  . GERD (gastroesophageal reflux disease)   . Hypercholesterolemia   . Migraine   . Personal history of radiation therapy 2008   Right  . Wears dentures    partial upper    Past Surgical History:  Procedure Laterality Date  . ABDOMINAL HYSTERECTOMY    . BREAST BIOPSY Right 02/27/2007  . BREAST LUMPECTOMY Right 04/02/2007  . BREAST SURGERY  01/14/2013   breast reduction left side  . BROW LIFT Bilateral 06/17/2017   Procedure: BLEPHAROPLASTY UPPER EYELID WITH EXCESS SKIN;  Surgeon: Karle Starch, MD;  Location: [place];  Service: Ophthalmology;  Laterality: Bilateral;  . CESAREAN SECTION     two times  . COLONOSCOPY    . FOOT SURGERY Right   . PTOSIS REPAIR Bilateral 06/17/2017   Procedure: PTOSIS REPAIR RESECT EX;  Surgeon: Karle Starch, MD;  Location: [place];  Service: Ophthalmology;  Laterality: Bilateral;  . REDUCTION MAMMAPLASTY Left   . SHOULDER ARTHROSCOPY WITH ROTATOR CUFF REPAIR AND OPEN BICEPS TENODESIS Right 02/01/2019   Procedure: RIGHT SHOULDER ARTHROSCOPY, DEBRIDEMENT, BICEPS TENODESIS, MINI ROTATOR CUFF TEAR REPAIR;  Surgeon: Meredith Pel,  MD;  Location: [place];  Service: Orthopedics;  Laterality: Right;    There were no vitals filed for this visit.  Subjective Assessment - 03/22/19 0801    Subjective  Patient states that she has some soreness on top of the shoulder after doing exercises, but this goes away after a few hours. Patient denies any other concerns and continues to do exercises.    Currently in Pain?  Yes    Pain Score  2     Pain Location  Shoulder    Pain Orientation  Right    Pain Type  Surgical pain    Pain Onset  More than a month ago       TREATMENT  Therapeutic Exercise: PROM flexion, abduction, ER, IR x20 each AAROM, wand: flexion x30   : abduction x30   : ER x30 Supine AROM : flexion to 0-30 degrees working up to 0- 90degrees 2x10   : (elbow at 90) 0-90 degrees abduction x10; (elbow at 0) 0-90 degrees abduction x10   : ER/IR 2x10  Isometrics (elbow at 90, shoulder at 0): flexion 5 sec hold x10 x2        : extension 5 sec hold x10 x2        : adduction 5 sec hold 10 x2         : ER  5 sec hold  x10 x2          : IR 5 sec hold x10 x2  Patient educated throughout session on appropriate technique and form using multi-modal cueing, HEP, and activity modification. Patient articulated understanding and returned demonstration.  Patient Response to interventions: Patient reports feeling more able to move the arm after interventions.  ASSESSMENT Patient presents to clinic with excellent motivation to participate in therapy. Patient demonstrates deficits in R shoulder AROM, strength, function as evidenced by increased pain with active flexion to ~30 degrees in supine for initial repetitions and able to achieve 90 degrees of flexion in supine for later repetitions. Patient able to achieve appropriate form for shoulder isometrics at 0 degrees of flexion and abduction during today's session and responded positively to active interventions. Patient will benefit from continued skilled  therapeutic intervention to address remaining deficits in R shoulder AROM, strength, function in order to return to full participation in activities, increase function, and improve overall QOL.    PT Long Term Goals - 03/15/19 1335      PT LONG TERM GOAL #1   Title  Patient will be independent with HEP in order to improve strength and decrease pain in order to improve pain-free function at home and work.    Baseline  IE: provided    Time  4    Period  Weeks    Status  New    Target Date  04/12/19      PT LONG TERM GOAL #2   Title  Patient will decrease worst pain as reported on NPRS by at least 3 points in order to demonstrate clinically significant reduction in pain.    Baseline  IE: 10/10    Time  4    Period  Weeks    Target Date  04/12/19      PT LONG TERM GOAL #3   Title  Patient will indicate a score of 71 or greater on FOTO (Quick Dash) to demonstrate clinically significant improvement in function of RUE for improved independence.    Baseline  IE: 56    Time  4    Period  Weeks    Status  New    Target Date  04/12/19      PT LONG TERM GOAL #4   Title  Patient will demonstrate AROM of R shoulder WFLs (abduction> 120, flexion > 160, IR >65, ER> 65) in order to demonstrate ability to return to participation in [place] activities without limitation.    Baseline  IE: flexion 147, abduction 84, IR 58, ER 46    Time  4    Period  Weeks    Status  New    Target Date  04/12/19            Plan - 03/22/19 2633    Clinical Impression Statement  Patient presents to clinic with excellent motivation to participate in therapy. Patient demonstrates deficits in R shoulder AROM, strength, function as evidenced by increased pain with active flexion to ~30 degrees in supine for initial repetitions and able to achieve 90 degrees of flexion in supine for later repetitions. Patient able to achieve appropriate form for shoulder isometrics at 0 degrees of flexion and abduction during  today's session and responded positively to active interventions. Patient will benefit from continued skilled therapeutic intervention to address remaining deficits in R shoulder AROM, strength, function in order to return to full participation in activities, increase function, and improve overall QOL.  Personal Factors and Comorbidities  Age;Sex;Behavior Pattern;Fitness;Past/Current Experience;Time since onset of injury/illness/exacerbation;Comorbidity 3+    Comorbidities  anemia, anxiety, hx of R breast cancer, migraines    Examination-Activity Limitations  Bathing;Hygiene/Grooming;Lift;Reach Overhead;Carry;Dressing;Sleep    Examination-Participation Restrictions  Laundry;Shop;Cleaning;Community Activity;Yard Work    [employer]  Evolving/Moderate complexity    Rehab Potential  Good    PT Frequency  2x / week    PT Duration  4 weeks    PT Treatment/Interventions  ADLs/Self Care Home Management;Aquatic Therapy;Cryotherapy;Electrical Stimulation;Moist Heat;Therapeutic activities;Therapeutic exercise;Balance training;Stair training;Gait training;Functional mobility training;Neuromuscular re-education;Patient/family education;Manual techniques;Scar mobilization;Passive range of motion;Dry needling;Taping;Spinal Manipulations;Joint Manipulations    Consulted and Agree with Plan of Care  Patient       Patient will benefit from skilled therapeutic intervention in order to improve the following deficits and impairments:  Decreased mobility, Hypomobility, Increased muscle spasms, Improper body mechanics, Decreased scar mobility, Decreased activity tolerance, Decreased strength, Impaired flexibility, Impaired UE functional use, Postural dysfunction, Pain  Visit Diagnosis: 1. Right shoulder pain, unspecified chronicity   2. Muscle weakness (generalized)   3. Decreased ROM of right shoulder        Problem List Patient Active Problem List   Diagnosis Date Noted  . Elevated  blood pressure reading without diagnosis of hypertension 01/13/2019  . Vaginal atrophy 12/15/2018  . Dyspnea on exertion 10/21/2018  . Family history of early CAD 07/08/2018  . Allergic rhinitis 06/25/2018  . Breast mass, right 05/25/2018  . Varicose veins of bilateral lower extremities with pain 06/09/2017  . Menopausal symptoms 02/16/2015  . Vitamin B 12 deficiency 04/11/2014  . Stress incontinence 08/31/2013  . Cervico-occipital neuralgia 06/01/2013  . Acute sinus infection 03/30/2013  . HLD (hyperlipidemia) 03/09/2012  . Migraine 12/31/2011  . Anxiety 12/31/2011  . Unspecified mood (affective) disorder ([place]) 12/31/2011  . Breast cancer [facility]) 12/31/2011   Myles Gip PT, DPT [phone] 03/22/2019, 8:49 AM  [facility] [address] [place], [place], [address] Phone: [phone]   Fax:  [phone]  Name: EMARA LICHTER MRN: [id number] Date of Birth: 1955/10/25

## 2019-03-24 ENCOUNTER — Encounter: Payer: Self-pay | Admitting: Physical Therapy

## 2019-03-24 ENCOUNTER — Ambulatory Visit: Payer: PRIVATE HEALTH INSURANCE | Admitting: Physical Therapy

## 2019-03-24 ENCOUNTER — Other Ambulatory Visit: Payer: Self-pay

## 2019-03-24 DIAGNOSIS — M25511 Pain in right shoulder: Secondary | ICD-10-CM

## 2019-03-24 DIAGNOSIS — M25611 Stiffness of right shoulder, not elsewhere classified: Secondary | ICD-10-CM

## 2019-03-24 DIAGNOSIS — M6281 Muscle weakness (generalized): Secondary | ICD-10-CM

## 2019-03-24 NOTE — Therapy (Signed)
[facility] [address] [place], [place], [address] Phone: [phone]   Fax:  [phone]  Physical Therapy Treatment  Patient Details  Name: [name] MRN: [id number] Date of Birth: [birth year] Referring Provider (PT): Marcene Duos MD   Encounter Date: 03/24/2019  PT End of Session - 03/24/19 0821    Visit Number  4    Number of Visits  9    Date for PT Re-Evaluation  04/12/19    PT Start Time  0800    PT Stop Time  9518    PT Time Calculation (min)  54 min    Activity Tolerance  Patient tolerated treatment well    Behavior During Therapy  [facility] for tasks assessed/performed       Past Medical History:  Diagnosis Date  . Anemia   . Anxiety   . Blood transfusion without reported diagnosis   . Breast cancer ([place]) 02/27/2007   Right  . GERD (gastroesophageal reflux disease)   . Hypercholesterolemia   . Migraine   . Personal history of radiation therapy 2008   Right  . Wears dentures    partial upper    Past Surgical History:  Procedure Laterality Date  . ABDOMINAL HYSTERECTOMY    . BREAST BIOPSY Right 02/27/2007  . BREAST LUMPECTOMY Right 04/02/2007  . BREAST SURGERY  01/14/2013   breast reduction left side  . BROW LIFT Bilateral 06/17/2017   Procedure: BLEPHAROPLASTY UPPER EYELID WITH EXCESS SKIN;  Surgeon: Karle Starch, MD;  Location: [place];  Service: Ophthalmology;  Laterality: Bilateral;  . CESAREAN SECTION     two times  . COLONOSCOPY    . FOOT SURGERY Right   . PTOSIS REPAIR Bilateral 06/17/2017   Procedure: PTOSIS REPAIR RESECT EX;  Surgeon: Karle Starch, MD;  Location: [place];  Service: Ophthalmology;  Laterality: Bilateral;  . REDUCTION MAMMAPLASTY Left   . SHOULDER ARTHROSCOPY WITH ROTATOR CUFF REPAIR AND OPEN BICEPS TENODESIS Right 02/01/2019   Procedure: RIGHT SHOULDER ARTHROSCOPY, DEBRIDEMENT, BICEPS TENODESIS, MINI ROTATOR CUFF TEAR REPAIR;  Surgeon: Meredith Pel,  MD;  Location: [place];  Service: Orthopedics;  Laterality: Right;    There were no vitals filed for this visit.  Subjective Assessment - 03/24/19 0814    Subjective  Patient reports continued pain at the shoulder, but denies any other discomofrt after her last session. She continues to perform her HEP. She demonstrates relatively symmetrical > 120 degrees of AROM shoulder flexion.    Pain Onset  More than a month ago       TREATMENT  Therapeutic Exercise: PROM flexion, abduction, ER, IR, scaption x20 each AAROM, wand: flexion x30   : abduction x30   : ER x30 Standing AROM : flexion to ~120 degrees  2x10   : (elbow at 90) 0-90 degrees abduction x10; (elbow at 0) 0-90 degrees abduction x10   : ER/IR 2x10  Prone AROM: extension x10 (palm up, palm in each), scapular adduction x10  Scapular CARs reverse: x10 Isometrics (elbow at 90, shoulder at 0): flexion 5 sec hold x10 x2        : extension 5 sec hold x10 x2        : adduction 5 sec hold 10 x2         : ER 5 sec hold  x10 x2          : IR 5 sec hold x10 x2 Seated thoracic  extension 5 sec hold x15  Patient educated throughout session on appropriate technique and form using multi-modal cueing, HEP, and activity modification. Patient articulated understanding and returned demonstration.  Patient Response to interventions: Patient denies any increased pain or discomfort at the shoulder.  ASSESSMENT Patient presents to clinic with excellent motivation to participate in therapy. Patient demonstrates deficits in R shoulder AROM, strength, function as evidenced by increased pain with active abduction against gravity to >90 degrees. Patient able to achieve appropriate form for thoracic extension and shoulder extension against gravity during today's session and responded positively to active interventions. Patient will benefit from continued skilled therapeutic intervention to address remaining deficits in R shoulder AROM,  strength, function in order to return to full participation in activities, increase function, and improve overall QOL.      PT Long Term Goals - 03/15/19 1335      PT LONG TERM GOAL #1   Title  Patient will be independent with HEP in order to improve strength and decrease pain in order to improve pain-free function at home and work.    Baseline  IE: provided    Time  4    Period  Weeks    Status  New    Target Date  04/12/19      PT LONG TERM GOAL #2   Title  Patient will decrease worst pain as reported on NPRS by at least 3 points in order to demonstrate clinically significant reduction in pain.    Baseline  IE: 10/10    Time  4    Period  Weeks    Target Date  04/12/19      PT LONG TERM GOAL #3   Title  Patient will indicate a score of 71 or greater on FOTO (Quick Dash) to demonstrate clinically significant improvement in function of RUE for improved independence.    Baseline  IE: 56    Time  4    Period  Weeks    Status  New    Target Date  04/12/19      PT LONG TERM GOAL #4   Title  Patient will demonstrate AROM of R shoulder WFLs (abduction> 120, flexion > 160, IR >65, ER> 65) in order to demonstrate ability to return to participation in [place] activities without limitation.    Baseline  IE: flexion 147, abduction 84, IR 58, ER 46    Time  4    Period  Weeks    Status  New    Target Date  04/12/19            Plan - 03/24/19 0824    Clinical Impression Statement  Patient presents to clinic with excellent motivation to participate in therapy. Patient demonstrates deficits in R shoulder AROM, strength, function as evidenced by increased pain with active abduction against gravity to >90 degrees. Patient able to achieve appropriate form for thoracic extension and shoulder extension against gravity during today's session and responded positively to active interventions. Patient will benefit from continued skilled therapeutic intervention to address remaining deficits in  R shoulder AROM, strength, function in order to return to full participation in activities, increase function, and improve overall QOL.    Personal Factors and Comorbidities  Age;Sex;Behavior Pattern;Fitness;Past/Current Experience;Time since onset of injury/illness/exacerbation;Comorbidity 3+    Comorbidities  anemia, anxiety, hx of R breast cancer, migraines    Examination-Activity Limitations  Bathing;Hygiene/Grooming;Lift;Reach Overhead;Carry;Dressing;Sleep    Examination-Participation Restrictions  Laundry;Shop;Cleaning;Community Activity;Yard Work    [employer]  Evolving/Moderate complexity    Rehab Potential  Good    PT Frequency  2x / week    PT Duration  4 weeks    PT Treatment/Interventions  ADLs/Self Care Home Management;Aquatic Therapy;Cryotherapy;Electrical Stimulation;Moist Heat;Therapeutic activities;Therapeutic exercise;Balance training;Stair training;Gait training;Functional mobility training;Neuromuscular re-education;Patient/family education;Manual techniques;Scar mobilization;Passive range of motion;Dry needling;Taping;Spinal Manipulations;Joint Manipulations    Consulted and Agree with Plan of Care  Patient       Patient will benefit from skilled therapeutic intervention in order to improve the following deficits and impairments:  Decreased mobility, Hypomobility, Increased muscle spasms, Improper body mechanics, Decreased scar mobility, Decreased activity tolerance, Decreased strength, Impaired flexibility, Impaired UE functional use, Postural dysfunction, Pain  Visit Diagnosis: 1. Right shoulder pain, unspecified chronicity   2. Muscle weakness (generalized)   3. Decreased ROM of right shoulder        Problem List Patient Active Problem List   Diagnosis Date Noted  . Elevated blood pressure reading without diagnosis of hypertension 01/13/2019  . Vaginal atrophy 12/15/2018  . Dyspnea on exertion 10/21/2018  . Family history of early CAD  07/08/2018  . Allergic rhinitis 06/25/2018  . Breast mass, right 05/25/2018  . Varicose veins of bilateral lower extremities with pain 06/09/2017  . Menopausal symptoms 02/16/2015  . Vitamin B 12 deficiency 04/11/2014  . Stress incontinence 08/31/2013  . Cervico-occipital neuralgia 06/01/2013  . Acute sinus infection 03/30/2013  . HLD (hyperlipidemia) 03/09/2012  . Migraine 12/31/2011  . Anxiety 12/31/2011  . Unspecified mood (affective) disorder ([place]) 12/31/2011  . Breast cancer [facility]) 12/31/2011   Myles Gip PT, DPT [phone] 03/24/2019, 10:25 AM  [facility] [address] [place], [place], [address] Phone: [phone]   Fax:  [phone]  Name: [name] MRN: [id number] Date of Birth: 19-Nov-1955

## 2019-03-29 ENCOUNTER — Encounter: Payer: Self-pay | Admitting: Physical Therapy

## 2019-03-29 ENCOUNTER — Ambulatory Visit: Payer: PRIVATE HEALTH INSURANCE | Admitting: Physical Therapy

## 2019-03-29 ENCOUNTER — Other Ambulatory Visit: Payer: Self-pay

## 2019-03-29 DIAGNOSIS — M6281 Muscle weakness (generalized): Secondary | ICD-10-CM

## 2019-03-29 DIAGNOSIS — M25511 Pain in right shoulder: Secondary | ICD-10-CM | POA: Diagnosis not present

## 2019-03-29 DIAGNOSIS — M25611 Stiffness of right shoulder, not elsewhere classified: Secondary | ICD-10-CM

## 2019-03-29 NOTE — Therapy (Signed)
[place] [facility] [address]. [place], [place], [address] Phone: [phone]   Fax:  [phone]  Physical Therapy Treatment  Patient Details  Name: [name] MRN: [id number] Date of Birth: 1956-08-28 Referring Provider (PT): Marcene Duos MD   Encounter Date: 03/29/2019  PT End of Session - 03/29/19 0853    Visit Number  5    Number of Visits  9    Date for PT Re-Evaluation  04/12/19    PT Start Time  0800    PT Stop Time  0853    PT Time Calculation (min)  53 min    Activity Tolerance  Patient tolerated treatment well    Behavior During Therapy  [facility] for tasks assessed/performed       Past Medical History:  Diagnosis Date  . Anemia   . Anxiety   . Blood transfusion without reported diagnosis   . Breast cancer ([place]) 02/27/2007   Right  . GERD (gastroesophageal reflux disease)   . Hypercholesterolemia   . Migraine   . Personal history of radiation therapy 2008   Right  . Wears dentures    partial upper    Past Surgical History:  Procedure Laterality Date  . ABDOMINAL HYSTERECTOMY    . BREAST BIOPSY Right 02/27/2007  . BREAST LUMPECTOMY Right 04/02/2007  . BREAST SURGERY  01/14/2013   breast reduction left side  . BROW LIFT Bilateral 06/17/2017   Procedure: BLEPHAROPLASTY UPPER EYELID WITH EXCESS SKIN;  Surgeon: Karle Starch, MD;  Location: [place];  Service: Ophthalmology;  Laterality: Bilateral;  . CESAREAN SECTION     two times  . COLONOSCOPY    . FOOT SURGERY Right   . PTOSIS REPAIR Bilateral 06/17/2017   Procedure: PTOSIS REPAIR RESECT EX;  Surgeon: Karle Starch, MD;  Location: [place];  Service: Ophthalmology;  Laterality: Bilateral;  . REDUCTION MAMMAPLASTY Left   . SHOULDER ARTHROSCOPY WITH ROTATOR CUFF REPAIR AND OPEN BICEPS TENODESIS Right 02/01/2019   Procedure: RIGHT SHOULDER ARTHROSCOPY, DEBRIDEMENT, BICEPS TENODESIS, MINI ROTATOR CUFF TEAR REPAIR;  Surgeon: Meredith Pel, MD;  Location: [place];  Service: Orthopedics;  Laterality: Right;    There were no vitals filed for this visit.  Subjective Assessment - 03/29/19 0804    Subjective  Patient reports that after the last session she had no pain and felt she had improved motion. Patient states that she feels she may have overdone it with HEP and stretching and had increased pain on Sunday (6/10).    Currently in Pain?  Yes    Pain Score  3     Pain Location  Shoulder    Pain Orientation  Right    Pain Descriptors / Indicators  Shooting    Pain Onset  More than a month ago      TREATMENT  Pre-treatment assessment: R shoulder flexion, < 120 degrees with increased elbow flexion noted on R  Neuromuscular Re-education: Progressive Angular Isometrics: flexion and abduction (10 sec x3) (15-30% MVC) Regressive Angular Isometrics: flexion and abduction (10 sec x3) (15-30% MVC) Controlled articular rotations for improved coordination without compensation: elbow x3, GH x3, scapular x3 (25% MVC)  Therapeutic Exercise: PROM flexion, abduction, ER, IR, scaption x20 each AAROM, wand: flexion 3 x2 min   : abduction 3 x 2 min   Patient educated throughout session on appropriate technique and form using multi-modal cueing, HEP, and activity modification. Patient articulated understanding and returned demonstration.  Patient Response to interventions: Patient denies any pain and reports improved motion.  ASSESSMENT Patient presents to clinic with excellent motivation to participate in therapy despite a small perceived setback in progress over the weekend. Patient demonstrates deficits in R shoulder AROM, strength, function as evidenced by limitations in AROM against gravity and increased compensatory patterns, as well as increased need for rest. Patient able to achieve active R shoulder flexion with elbow extension during today's session and responded positively to neuromuscular re-education  interventions. Patient will benefit from continued skilled therapeutic intervention to address remaining deficits in R shoulder AROM, strength, function in order to participate fully in work and community activities, increase function, and improve overall QOL.     PT Long Term Goals - 03/15/19 1335      PT LONG TERM GOAL #1   Title  Patient will be independent with HEP in order to improve strength and decrease pain in order to improve pain-free function at home and work.    Baseline  IE: provided    Time  4    Period  Weeks    Status  New    Target Date  04/12/19      PT LONG TERM GOAL #2   Title  Patient will decrease worst pain as reported on NPRS by at least 3 points in order to demonstrate clinically significant reduction in pain.    Baseline  IE: 10/10    Time  4    Period  Weeks    Target Date  04/12/19      PT LONG TERM GOAL #3   Title  Patient will indicate a score of 71 or greater on FOTO (Quick Dash) to demonstrate clinically significant improvement in function of RUE for improved independence.    Baseline  IE: 56    Time  4    Period  Weeks    Status  New    Target Date  04/12/19      PT LONG TERM GOAL #4   Title  Patient will demonstrate AROM of R shoulder WFLs (abduction> 120, flexion > 160, IR >65, ER> 65) in order to demonstrate ability to return to participation in [place] activities without limitation.    Baseline  IE: flexion 147, abduction 84, IR 58, ER 46    Time  4    Period  Weeks    Status  New    Target Date  04/12/19            Plan - 03/29/19 0941    Clinical Impression Statement  Patient presents to clinic with excellent motivation to participate in therapy despite a small perceived setback in progress over the weekend. Patient demonstrates deficits in R shoulder AROM, strength, function as evidenced by limitations in AROM against gravity and increased compensatory patterns, as well as increased need for rest. Patient able to achieve active R  shoulder flexion with elbow extension during today's session and responded positively to neuromuscular re-education interventions. Patient will benefit from continued skilled therapeutic intervention to address remaining deficits in R shoulder AROM, strength, function in order to participate fully in work and community activities, increase function, and improve overall QOL.    Personal Factors and Comorbidities  Age;Sex;Behavior Pattern;Fitness;Past/Current Experience;Time since onset of injury/illness/exacerbation;Comorbidity 3+    Comorbidities  anemia, anxiety, hx of R breast cancer, migraines    Examination-Activity Limitations  Bathing;Hygiene/Grooming;Lift;Reach Overhead;Carry;Dressing;Sleep    Examination-Participation Restrictions  Laundry;Shop;Cleaning;Community Activity;Yard Work    [employer]  Evolving/Moderate complexity  Rehab Potential  Good    PT Frequency  2x / week    PT Duration  4 weeks    PT Treatment/Interventions  ADLs/Self Care Home Management;Aquatic Therapy;Cryotherapy;Electrical Stimulation;Moist Heat;Therapeutic activities;Therapeutic exercise;Balance training;Stair training;Gait training;Functional mobility training;Neuromuscular re-education;Patient/family education;Manual techniques;Scar mobilization;Passive range of motion;Dry needling;Taping;Spinal Manipulations;Joint Manipulations    Consulted and Agree with Plan of Care  Patient       Patient will benefit from skilled therapeutic intervention in order to improve the following deficits and impairments:  Decreased mobility, Hypomobility, Increased muscle spasms, Improper body mechanics, Decreased scar mobility, Decreased activity tolerance, Decreased strength, Impaired flexibility, Impaired UE functional use, Postural dysfunction, Pain  Visit Diagnosis: 1. Right shoulder pain, unspecified chronicity   2. Muscle weakness (generalized)   3. Decreased ROM of right shoulder        Problem  List Patient Active Problem List   Diagnosis Date Noted  . Elevated blood pressure reading without diagnosis of hypertension 01/13/2019  . Vaginal atrophy 12/15/2018  . Dyspnea on exertion 10/21/2018  . Family history of early CAD 07/08/2018  . Allergic rhinitis 06/25/2018  . Breast mass, right 05/25/2018  . Varicose veins of bilateral lower extremities with pain 06/09/2017  . Menopausal symptoms 02/16/2015  . Vitamin B 12 deficiency 04/11/2014  . Stress incontinence 08/31/2013  . Cervico-occipital neuralgia 06/01/2013  . Acute sinus infection 03/30/2013  . HLD (hyperlipidemia) 03/09/2012  . Migraine 12/31/2011  . Anxiety 12/31/2011  . Unspecified mood (affective) disorder ([place]) 12/31/2011  . Breast cancer [facility]) 12/31/2011   Myles Gip PT, DPT [phone] 03/29/2019, 10:04 AM  [place] [facility] [address] [place], [place], [address] Phone: [phone]   Fax:  [phone]  Name: [name] MRN: [id number] Date of Birth: 09/24/1955

## 2019-03-31 ENCOUNTER — Ambulatory Visit: Payer: PRIVATE HEALTH INSURANCE | Admitting: Physical Therapy

## 2019-03-31 ENCOUNTER — Encounter: Payer: Self-pay | Admitting: Physical Therapy

## 2019-03-31 ENCOUNTER — Other Ambulatory Visit: Payer: Self-pay

## 2019-03-31 DIAGNOSIS — M25611 Stiffness of right shoulder, not elsewhere classified: Secondary | ICD-10-CM

## 2019-03-31 DIAGNOSIS — M25511 Pain in right shoulder: Secondary | ICD-10-CM

## 2019-03-31 DIAGNOSIS — M6281 Muscle weakness (generalized): Secondary | ICD-10-CM

## 2019-03-31 NOTE — Therapy (Signed)
[place] [facility] [address] [place], [place], [address] Phone: [phone]   Fax:  [phone]  Physical Therapy Treatment  Patient Details  Name: [name] MRN: [id number] Date of Birth: 1955-11-04 Referring Provider (PT): Marcene Duos MD   Encounter Date: 03/31/2019  PT End of Session - 03/31/19 0853    Visit Number  6    Number of Visits  9    Date for PT Re-Evaluation  04/12/19    PT Start Time  0809    PT Stop Time  0853    PT Time Calculation (min)  44 min    Activity Tolerance  Patient tolerated treatment well    Behavior During Therapy  [facility] for tasks assessed/performed       Past Medical History:  Diagnosis Date  . Anemia   . Anxiety   . Blood transfusion without reported diagnosis   . Breast cancer ([place]) 02/27/2007   Right  . GERD (gastroesophageal reflux disease)   . Hypercholesterolemia   . Migraine   . Personal history of radiation therapy 2008   Right  . Wears dentures    partial upper    Past Surgical History:  Procedure Laterality Date  . ABDOMINAL HYSTERECTOMY    . BREAST BIOPSY Right 02/27/2007  . BREAST LUMPECTOMY Right 04/02/2007  . BREAST SURGERY  01/14/2013   breast reduction left side  . BROW LIFT Bilateral 06/17/2017   Procedure: BLEPHAROPLASTY UPPER EYELID WITH EXCESS SKIN;  Surgeon: Karle Starch, MD;  Location: [place];  Service: Ophthalmology;  Laterality: Bilateral;  . CESAREAN SECTION     two times  . COLONOSCOPY    . FOOT SURGERY Right   . PTOSIS REPAIR Bilateral 06/17/2017   Procedure: PTOSIS REPAIR RESECT EX;  Surgeon: Karle Starch, MD;  Location: [place];  Service: Ophthalmology;  Laterality: Bilateral;  . REDUCTION MAMMAPLASTY Left   . SHOULDER ARTHROSCOPY WITH ROTATOR CUFF REPAIR AND OPEN BICEPS TENODESIS Right 02/01/2019   Procedure: RIGHT SHOULDER ARTHROSCOPY, DEBRIDEMENT, BICEPS TENODESIS, MINI ROTATOR CUFF TEAR REPAIR;  Surgeon: Meredith Pel, MD;  Location: [place];  Service: Orthopedics;  Laterality: Right;    There were no vitals filed for this visit.  Subjective Assessment - 03/31/19 0809    Subjective  Patient reports that she has had a stressful couple of days with her dog's health. She notes no significant changes in her her shoulder/arm.    Pain Onset  More than a month ago       TREATMENT  Neuromuscular Re-education: Progressive Angular Isometrics: flexion and abduction varying degrees (10 sec x3) (15-30% MVC) Regressive Angular Isometrics: flexion and abduction varying degrees (10 sec x3) (15-30% MVC) Controlled articular rotations for improved coordination without compensation: elbow x3, GH x3, scapular x3 (25% MVC)  Therapeutic Exercise: PROM flexion, abduction, ER, IR, scaption x20 each AAROM, wand: flexion 3 x2 min   : abduction 3 x 2 min   : ER 3 x 2 min   Patient educated throughout session on appropriate technique and form using multi-modal cueing, HEP, and activity modification. Patient articulated understanding and returned demonstration.  Patient Response to interventions: Patient reports no increased or resting pain.  ASSESSMENT Patient presents to clinic with excellent motivation to participate in therapy. Patient demonstrates deficits in R shoulder AROM, strength, function as evidenced by increased compensatory patterns at end ranges of motion. Patient able to achieve abduction isometrics at 20% MVC at ~95  degrees of abduction in supine for repeated bouts during today's session and responded positively to neuromuscular re-education interventions. Patient will benefit from continued skilled therapeutic intervention to address remaining deficits in R shoulder AROM, strength, function in order to participate fully in work and community activities, increase function, and improve overall QOL.      PT Long Term Goals - 03/15/19 1335      PT LONG TERM GOAL #1   Title  Patient  will be independent with HEP in order to improve strength and decrease pain in order to improve pain-free function at home and work.    Baseline  IE: provided    Time  4    Period  Weeks    Status  New    Target Date  04/12/19      PT LONG TERM GOAL #2   Title  Patient will decrease worst pain as reported on NPRS by at least 3 points in order to demonstrate clinically significant reduction in pain.    Baseline  IE: 10/10    Time  4    Period  Weeks    Target Date  04/12/19      PT LONG TERM GOAL #3   Title  Patient will indicate a score of 71 or greater on FOTO (Quick Dash) to demonstrate clinically significant improvement in function of RUE for improved independence.    Baseline  IE: 56    Time  4    Period  Weeks    Status  New    Target Date  04/12/19      PT LONG TERM GOAL #4   Title  Patient will demonstrate AROM of R shoulder WFLs (abduction> 120, flexion > 160, IR >65, ER> 65) in order to demonstrate ability to return to participation in [place] activities without limitation.    Baseline  IE: flexion 147, abduction 84, IR 58, ER 46    Time  4    Period  Weeks    Status  New    Target Date  04/12/19            Plan - 03/31/19 0853    Clinical Impression Statement  Patient presents to clinic with excellent motivation to participate in therapy. Patient demonstrates deficits in R shoulder AROM, strength, function as evidenced by increased compensatory patterns at end ranges of motion. Patient able to achieve abduction isometrics at 20% MVC at ~95 degrees of abduction in supine for repeated bouts during today's session and responded positively to neuromuscular re-education interventions. Patient will benefit from continued skilled therapeutic intervention to address remaining deficits in R shoulder AROM, strength, function in order to participate fully in work and community activities, increase function, and improve overall QOL.    Personal Factors and Comorbidities   Age;Sex;Behavior Pattern;Fitness;Past/Current Experience;Time since onset of injury/illness/exacerbation;Comorbidity 3+    Comorbidities  anemia, anxiety, hx of R breast cancer, migraines    Examination-Activity Limitations  Bathing;Hygiene/Grooming;Lift;Reach Overhead;Carry;Dressing;Sleep    Examination-Participation Restrictions  Laundry;Shop;Cleaning;Community Activity;Yard Work    [employer] officer  Evolving/Moderate complexity    Rehab Potential  Good    PT Frequency  2x / week    PT Duration  4 weeks    PT Treatment/Interventions  ADLs/Self Care Home Management;Aquatic Therapy;Cryotherapy;Electrical Stimulation;Moist Heat;Therapeutic activities;Therapeutic exercise;Balance training;Stair training;Gait training;Functional mobility training;Neuromuscular re-education;Patient/family education;Manual techniques;Scar mobilization;Passive range of motion;Dry needling;Taping;Spinal Manipulations;Joint Manipulations    Consulted and Agree with Plan of Care  Patient       Patient will benefit  from skilled therapeutic intervention in order to improve the following deficits and impairments:  Decreased mobility, Hypomobility, Increased muscle spasms, Improper body mechanics, Decreased scar mobility, Decreased activity tolerance, Decreased strength, Impaired flexibility, Impaired UE functional use, Postural dysfunction, Pain  Visit Diagnosis: 1. Right shoulder pain, unspecified chronicity   2. Muscle weakness (generalized)   3. Decreased ROM of right shoulder        Problem List Patient Active Problem List   Diagnosis Date Noted  . Elevated blood pressure reading without diagnosis of hypertension 01/13/2019  . Vaginal atrophy 12/15/2018  . Dyspnea on exertion 10/21/2018  . Family history of early CAD 07/08/2018  . Allergic rhinitis 06/25/2018  . Breast mass, right 05/25/2018  . Varicose veins of bilateral lower extremities with pain 06/09/2017  . Menopausal symptoms  02/16/2015  . Vitamin B 12 deficiency 04/11/2014  . Stress incontinence 08/31/2013  . Cervico-occipital neuralgia 06/01/2013  . Acute sinus infection 03/30/2013  . HLD (hyperlipidemia) 03/09/2012  . Migraine 12/31/2011  . Anxiety 12/31/2011  . Unspecified mood (affective) disorder ([place]) 12/31/2011  . Breast cancer ([place]) 12/31/2011   Myles Gip PT, DPT [phone] 03/31/2019, 12:21 PM  [place] [facility] [address] [place], [place], [address] Phone: [phone]   Fax:  [phone]  Name: [name] MRN: [id number] Date of Birth: 1955-11-28

## 2019-04-05 ENCOUNTER — Ambulatory Visit: Payer: PRIVATE HEALTH INSURANCE | Admitting: Physical Therapy

## 2019-04-07 ENCOUNTER — Ambulatory Visit: Payer: PRIVATE HEALTH INSURANCE | Admitting: Physical Therapy

## 2019-04-07 ENCOUNTER — Other Ambulatory Visit: Payer: Self-pay

## 2019-04-07 ENCOUNTER — Encounter: Payer: Self-pay | Admitting: Physical Therapy

## 2019-04-07 DIAGNOSIS — M25611 Stiffness of right shoulder, not elsewhere classified: Secondary | ICD-10-CM

## 2019-04-07 DIAGNOSIS — M25511 Pain in right shoulder: Secondary | ICD-10-CM

## 2019-04-07 DIAGNOSIS — M6281 Muscle weakness (generalized): Secondary | ICD-10-CM

## 2019-04-07 NOTE — Therapy (Signed)
[facility] [address] [place], [place], [address] Phone: [phone]   Fax:  [phone]  Physical Therapy Treatment  Patient Details  Name: [name] MRN: [id number] Date of Birth: [birth year] Referring Provider (PT): Marcene Duos MD   Encounter Date: 04/07/2019  PT End of Session - 04/07/19 0825    Visit Number  7    Number of Visits  9    Date for PT Re-Evaluation  04/12/19    PT Start Time  0805    PT Stop Time  0845    PT Time Calculation (min)  40 min    Activity Tolerance  Patient tolerated treatment well    Behavior During Therapy  [facility] for tasks assessed/performed       Past Medical History:  Diagnosis Date  . Anemia   . Anxiety   . Blood transfusion without reported diagnosis   . Breast cancer ([place]) 02/27/2007   Right  . GERD (gastroesophageal reflux disease)   . Hypercholesterolemia   . Migraine   . Personal history of radiation therapy 2008   Right  . Wears dentures    partial upper    Past Surgical History:  Procedure Laterality Date  . ABDOMINAL HYSTERECTOMY    . BREAST BIOPSY Right 02/27/2007  . BREAST LUMPECTOMY Right 04/02/2007  . BREAST SURGERY  01/14/2013   breast reduction left side  . BROW LIFT Bilateral 06/17/2017   Procedure: BLEPHAROPLASTY UPPER EYELID WITH EXCESS SKIN;  Surgeon: Karle Starch, MD;  Location: [place];  Service: Ophthalmology;  Laterality: Bilateral;  . CESAREAN SECTION     two times  . COLONOSCOPY    . FOOT SURGERY Right   . PTOSIS REPAIR Bilateral 06/17/2017   Procedure: PTOSIS REPAIR RESECT EX;  Surgeon: Karle Starch, MD;  Location: [place];  Service: Ophthalmology;  Laterality: Bilateral;  . REDUCTION MAMMAPLASTY Left   . SHOULDER ARTHROSCOPY WITH ROTATOR CUFF REPAIR AND OPEN BICEPS TENODESIS Right 02/01/2019   Procedure: RIGHT SHOULDER ARTHROSCOPY, DEBRIDEMENT, BICEPS TENODESIS, MINI ROTATOR CUFF TEAR REPAIR;  Surgeon: Meredith Pel, MD;  Location: [place];  Service: Orthopedics;  Laterality: Right;    There were no vitals filed for this visit.  Subjective Assessment - 04/07/19 1227    Subjective  Patient reports lack of sleep and increased stress over the past two days as her husband had to be seen at the hospital. She notes she was not as diligent with her exercises due to the added stressors, but also reports that she hasn't noticed increased shoulder pain with the stress and lack of sleep.    Currently in Pain?  No/denies    Pain Onset  More than a month ago      TREATMENT  Neuromuscular Re-education: Progressive Angular Isometrics: flexion, abduction, ER, IR (20 sec x8) (15-30% MVC) AROM flexion, abduction, ER, IR x20 each with eccentric return for improved coordination and decreased muscle spasm  Manual Therapy: Mobilizations with movement: flexion, abduction, ER, IR, scaption x20 each A-P and inferior, grade II  Patient educated throughout session on appropriate technique and form using multi-modal cueing, HEP, and activity modification. Patient articulated understanding and returned demonstration.  Post-treatment assessment: R shoulder flexion: 157 degrees; aBduction: 149 degrees; ER: 41 degrees; IR: 82 degrees  Patient Response to interventions: Patient denies any pain and reports improved motion.  ASSESSMENT Patient presents to clinic with decreased energy to participate in therapy due to increased stressors  at home. Patient demonstrates deficits in R shoulder AROM, strength, function as evidenced by limitations in AROM against gravity and increased compensatory patterns, as well as increased need for rest. Patient able to achieve active R shoulder flexion with elbow extension to 157 degrees during today's session and responded positively to neuromuscular re-education interventions. Patient will benefit from continued skilled therapeutic intervention to address remaining deficits in  R shoulder AROM, strength, function in order to participate fully in work and community activities, increase function, and improve overall QOL.         PT Long Term Goals - 03/15/19 1335      PT LONG TERM GOAL #1   Title  Patient will be independent with HEP in order to improve strength and decrease pain in order to improve pain-free function at home and work.    Baseline  IE: provided    Time  4    Period  Weeks    Status  New    Target Date  04/12/19      PT LONG TERM GOAL #2   Title  Patient will decrease worst pain as reported on NPRS by at least 3 points in order to demonstrate clinically significant reduction in pain.    Baseline  IE: 10/10    Time  4    Period  Weeks    Target Date  04/12/19      PT LONG TERM GOAL #3   Title  Patient will indicate a score of 71 or greater on FOTO (Quick Dash) to demonstrate clinically significant improvement in function of RUE for improved independence.    Baseline  IE: 56    Time  4    Period  Weeks    Status  New    Target Date  04/12/19      PT LONG TERM GOAL #4   Title  Patient will demonstrate AROM of R shoulder WFLs (abduction> 120, flexion > 160, IR >65, ER> 65) in order to demonstrate ability to return to participation in [place] activities without limitation.    Baseline  IE: flexion 147, abduction 84, IR 58, ER 46    Time  4    Period  Weeks    Status  New    Target Date  04/12/19            Plan - 04/07/19 1229    Clinical Impression Statement  Patient presents to clinic with decreased energy to participate in therapy due to increased stressors at home. Patient demonstrates deficits in R shoulder AROM, strength, function as evidenced by limitations in AROM against gravity and increased compensatory patterns, as well as increased need for rest. Patient able to achieve active R shoulder flexion with elbow extension to 157 degrees during today's session and responded positively to neuromuscular re-education  interventions. Patient will benefit from continued skilled therapeutic intervention to address remaining deficits in R shoulder AROM, strength, function in order to participate fully in work and community activities, increase function, and improve overall QOL.    Personal Factors and Comorbidities  Age;Sex;Behavior Pattern;Fitness;Past/Current Experience;Time since onset of injury/illness/exacerbation;Comorbidity 3+    Comorbidities  anemia, anxiety, hx of R breast cancer, migraines    Examination-Activity Limitations  Bathing;Hygiene/Grooming;Lift;Reach Overhead;Carry;Dressing;Sleep    Examination-Participation Restrictions  Laundry;Shop;Cleaning;Community Activity;Yard Work    Stability/Clinical Decision Making  Evolving/Moderate complexity    Rehab Potential  Good    PT Frequency  2x / week    PT Duration  4 weeks  PT Treatment/Interventions  ADLs/Self Care Home Management;Aquatic Therapy;Cryotherapy;Electrical Stimulation;Moist Heat;Therapeutic activities;Therapeutic exercise;Balance training;Stair training;Gait training;Functional mobility training;Neuromuscular re-education;Patient/family education;Manual techniques;Scar mobilization;Passive range of motion;Dry needling;Taping;Spinal Manipulations;Joint Manipulations    PT Next Visit Plan  PAILs and RAILs shoulder ER    Consulted and Agree with Plan of Care  Patient       Patient will benefit from skilled therapeutic intervention in order to improve the following deficits and impairments:  Decreased mobility, Hypomobility, Increased muscle spasms, Improper body mechanics, Decreased scar mobility, Decreased activity tolerance, Decreased strength, Impaired flexibility, Impaired UE functional use, Postural dysfunction, Pain  Visit Diagnosis: 1. Right shoulder pain, unspecified chronicity   2. Muscle weakness (generalized)   3. Decreased ROM of right shoulder        Problem List Patient Active Problem List   Diagnosis Date Noted  .  Elevated blood pressure reading without diagnosis of hypertension 01/13/2019  . Vaginal atrophy 12/15/2018  . Dyspnea on exertion 10/21/2018  . Family history of early CAD 07/08/2018  . Allergic rhinitis 06/25/2018  . Breast mass, right 05/25/2018  . Varicose veins of bilateral lower extremities with pain 06/09/2017  . Menopausal symptoms 02/16/2015  . Vitamin B 12 deficiency 04/11/2014  . Stress incontinence 08/31/2013  . Cervico-occipital neuralgia 06/01/2013  . Acute sinus infection 03/30/2013  . HLD (hyperlipidemia) 03/09/2012  . Migraine 12/31/2011  . Anxiety 12/31/2011  . Unspecified mood (affective) disorder ([place]) 12/31/2011  . Breast cancer [facility]) 12/31/2011   Myles Gip PT, DPT [phone] 04/07/2019, 12:41 PM  [facility] [address] [place], [place], [address] Phone: [phone]   Fax:  [phone]  Name: [name] MRN: [id number] Date of Birth: December 27, 1955

## 2019-04-12 ENCOUNTER — Encounter: Payer: Self-pay | Admitting: Physical Therapy

## 2019-04-12 ENCOUNTER — Other Ambulatory Visit: Payer: Self-pay

## 2019-04-12 ENCOUNTER — Ambulatory Visit: Payer: PRIVATE HEALTH INSURANCE | Admitting: Physical Therapy

## 2019-04-12 DIAGNOSIS — M25511 Pain in right shoulder: Secondary | ICD-10-CM | POA: Diagnosis not present

## 2019-04-12 DIAGNOSIS — M25611 Stiffness of right shoulder, not elsewhere classified: Secondary | ICD-10-CM

## 2019-04-12 DIAGNOSIS — M6281 Muscle weakness (generalized): Secondary | ICD-10-CM

## 2019-04-12 NOTE — Therapy (Signed)
[place] [facility] [address] [place], [place], [address] Phone: [phone]   Fax:  [phone]  Physical Therapy Treatment  Patient Details  Name: [name] MRN: [id number] Date of Birth: Jul 31, 1956 Referring Provider (PT): Marcene Duos MD   Encounter Date: 04/12/2019  PT End of Session - 04/12/19 0816    Visit Number  8    Number of Visits  17    Date for PT Re-Evaluation  06/07/19    PT Start Time  0807    PT Stop Time  0854    PT Time Calculation (min)  47 min    Activity Tolerance  Patient tolerated treatment well    Behavior During Therapy  [facility] for tasks assessed/performed       Past Medical History:  Diagnosis Date  . Anemia   . Anxiety   . Blood transfusion without reported diagnosis   . Breast cancer ([place]) 02/27/2007   Right  . GERD (gastroesophageal reflux disease)   . Hypercholesterolemia   . Migraine   . Personal history of radiation therapy 2008   Right  . Wears dentures    partial upper    Past Surgical History:  Procedure Laterality Date  . ABDOMINAL HYSTERECTOMY    . BREAST BIOPSY Right 02/27/2007  . BREAST LUMPECTOMY Right 04/02/2007  . BREAST SURGERY  01/14/2013   breast reduction left side  . BROW LIFT Bilateral 06/17/2017   Procedure: BLEPHAROPLASTY UPPER EYELID WITH EXCESS SKIN;  Surgeon: Karle Starch, MD;  Location: [place];  Service: Ophthalmology;  Laterality: Bilateral;  . CESAREAN SECTION     two times  . COLONOSCOPY    . FOOT SURGERY Right   . PTOSIS REPAIR Bilateral 06/17/2017   Procedure: PTOSIS REPAIR RESECT EX;  Surgeon: Karle Starch, MD;  Location: [place];  Service: Ophthalmology;  Laterality: Bilateral;  . REDUCTION MAMMAPLASTY Left   . SHOULDER ARTHROSCOPY WITH ROTATOR CUFF REPAIR AND OPEN BICEPS TENODESIS Right 02/01/2019   Procedure: RIGHT SHOULDER ARTHROSCOPY, DEBRIDEMENT, BICEPS TENODESIS, MINI ROTATOR CUFF TEAR REPAIR;  Surgeon: Meredith Pel, MD;  Location: [place];  Service: Orthopedics;  Laterality: Right;    There were no vitals filed for this visit.  Subjective Assessment - 04/12/19 0811    Subjective  Patient reports that she had some increased soreness after new exercises for improved ROM.    Currently in Pain?  Yes    Pain Score  4     Pain Location  Shoulder    Pain Orientation  Right    Pain Descriptors / Indicators  Sore    Pain Onset  More than a month ago       TREATMENT  Neuromuscular Re-education: Reassessed goals; see below. Progressive Angular Isometrics/ Regressive Angular Isometrics:  ER, IR (10 sec x10 sec) (40% MVC) AROM flexion, abduction, ER, IR x20 each with eccentric return for improved coordination and decreased muscle spasm  Manual Therapy: Mobilizations with movement: flexion, abduction, ER, IR, scaption x20 each A-P and inferior, grade II  Patient educated throughout session on appropriate technique and form using multi-modal cueing, HEP, and activity modification. Patient articulated understanding and returned demonstration.   Patient Response to interventions: Patient reports minimal soreness at supraspinatus referral pain location.  ASSESSMENT Patient presents to clinic with excellent motivation to participate in therapy. Patient demonstrates diminishing deficits in R shoulder AROM, strength, function as evidenced by improved AROM (flexion 164, abduction 162, IR 74,  ER 60 degrees). Patient able to achieve end range PAILs/RAILs during today's session and responded positively to neuromuscular re-education interventions. Patient's condition has the potential to improve in response to therapy. Maximum improvement is yet to be obtained. The anticipated improvement is attainable and reasonable in a generally predictable time. Patient will benefit from continued skilled therapeutic intervention to address remaining deficits in R shoulder AROM, strength, function in order to  participate fully in work and community activities, increase function, and improve overall QOL.      PT Long Term Goals - 04/12/19 0818      PT LONG TERM GOAL #1   Title  Patient will be independent with HEP in order to improve strength and decrease pain in order to improve pain-free function at home and work.    Baseline  IE: provided    Time  4    Period  Weeks    Status  Achieved      PT LONG TERM GOAL #2   Title  Patient will decrease worst pain as reported on NPRS by at least 3 points in order to demonstrate clinically significant reduction in pain.    Baseline  IE: 10/10; 04/12/2019: 4/10    Time  4    Period  Weeks    Status  Achieved      PT LONG TERM GOAL #3   Title  Patient will indicate a score of 71 or greater on FOTO (Quick Dash) to demonstrate clinically significant improvement in function of RUE for improved independence.    Baseline  IE: 56; 04/12/2019: 64    Time  8    Period  Weeks    Status  On-going    Target Date  06/07/19      PT LONG TERM GOAL #4   Title  Patient will demonstrate AROM of R shoulder WFLs (abduction> 120, flexion > 160, IR >65, ER> 65) in order to demonstrate ability to return to participation in [place] activities without limitation.    Baseline  IE: flexion 147, abduction 84, IR 58, ER 46; 04/12/2019: flexion 164, abduction 162, IR 74, ER 60    Time  8    Period  Weeks    Status  On-going    Target Date  06/07/19      PT LONG TERM GOAL #5   Title  Patient will demonstrate gross RUE 5/5 strength during MMT throughout AROM indicating complete recovery form surgery and safe return to participation in activities.    Baseline  04/12/2019: 3/5    Time  8    Period  Weeks    Status  New    Target Date  06/07/19            Plan - 04/12/19 0818    Clinical Impression Statement  Patient presents to clinic with excellent motivation to participate in therapy. Patient demonstrates diminishing deficits in R shoulder AROM, strength, function  as evidenced by improved AROM (flexion 164, abduction 162, IR 74, ER 60 degrees). Patient able to achieve end range PAILs/RAILs during today's session and responded positively to neuromuscular re-education interventions. Patient's condition has the potential to improve in response to therapy. Maximum improvement is yet to be obtained. The anticipated improvement is attainable and reasonable in a generally predictable time. Patient will benefit from continued skilled therapeutic intervention to address remaining deficits in R shoulder AROM, strength, function in order to participate fully in work and community activities, increase function, and improve overall QOL.  Personal Factors and Comorbidities  Age;Sex;Behavior Pattern;Fitness;Past/Current Experience;Time since onset of injury/illness/exacerbation;Comorbidity 3+    Comorbidities  anemia, anxiety, hx of R breast cancer, migraines    Examination-Activity Limitations  Bathing;Hygiene/Grooming;Lift;Reach Overhead;Carry;Dressing;Sleep    Examination-Participation Restrictions  Laundry;Shop;Cleaning;Community Activity;Yard Work    [employer] officer  Evolving/Moderate complexity    Rehab Potential  Good    PT Frequency  1x / week    PT Duration  8 weeks    PT Treatment/Interventions  ADLs/Self Care Home Management;Aquatic Therapy;Cryotherapy;Electrical Stimulation;Moist Heat;Therapeutic activities;Therapeutic exercise;Balance training;Stair training;Gait training;Functional mobility training;Neuromuscular re-education;Patient/family education;Manual techniques;Scar mobilization;Passive range of motion;Dry needling;Taping;Spinal Manipulations;Joint Manipulations    PT Next Visit Plan  PAILs and RAILs shoulder ER    Consulted and Agree with Plan of Care  Patient       Patient will benefit from skilled therapeutic intervention in order to improve the following deficits and impairments:  Decreased mobility, Hypomobility, Increased muscle  spasms, Improper body mechanics, Decreased scar mobility, Decreased activity tolerance, Decreased strength, Impaired flexibility, Impaired UE functional use, Postural dysfunction, Pain  Visit Diagnosis: 1. Right shoulder pain, unspecified chronicity   2. Muscle weakness (generalized)   3. Decreased ROM of right shoulder        Problem List Patient Active Problem List   Diagnosis Date Noted  . Elevated blood pressure reading without diagnosis of hypertension 01/13/2019  . Vaginal atrophy 12/15/2018  . Dyspnea on exertion 10/21/2018  . Family history of early CAD 07/08/2018  . Allergic rhinitis 06/25/2018  . Breast mass, right 05/25/2018  . Varicose veins of bilateral lower extremities with pain 06/09/2017  . Menopausal symptoms 02/16/2015  . Vitamin B 12 deficiency 04/11/2014  . Stress incontinence 08/31/2013  . Cervico-occipital neuralgia 06/01/2013  . Acute sinus infection 03/30/2013  . HLD (hyperlipidemia) 03/09/2012  . Migraine 12/31/2011  . Anxiety 12/31/2011  . Unspecified mood (affective) disorder ([place]) 12/31/2011  . Breast cancer ([place]) 12/31/2011    Louie Casa 04/12/2019, 9:09 AM  [place] [facility] [address] [place], [place], [address] Phone: [phone]   Fax:  [phone]  Name: [name] MRN: [id number] Date of Birth: 01/01/1956

## 2019-04-14 ENCOUNTER — Encounter: Payer: PRIVATE HEALTH INSURANCE | Admitting: Physical Therapy

## 2019-04-19 ENCOUNTER — Other Ambulatory Visit: Payer: Self-pay

## 2019-04-19 ENCOUNTER — Encounter: Payer: Self-pay | Admitting: Orthopedic Surgery

## 2019-04-19 ENCOUNTER — Ambulatory Visit (INDEPENDENT_AMBULATORY_CARE_PROVIDER_SITE_OTHER): Payer: PRIVATE HEALTH INSURANCE | Admitting: Orthopedic Surgery

## 2019-04-19 ENCOUNTER — Encounter: Payer: Self-pay | Admitting: Physical Therapy

## 2019-04-19 ENCOUNTER — Ambulatory Visit: Payer: PRIVATE HEALTH INSURANCE | Attending: Orthopedic Surgery | Admitting: Physical Therapy

## 2019-04-19 DIAGNOSIS — M25611 Stiffness of right shoulder, not elsewhere classified: Secondary | ICD-10-CM | POA: Insufficient documentation

## 2019-04-19 DIAGNOSIS — M6281 Muscle weakness (generalized): Secondary | ICD-10-CM | POA: Insufficient documentation

## 2019-04-19 DIAGNOSIS — M25511 Pain in right shoulder: Secondary | ICD-10-CM | POA: Diagnosis not present

## 2019-04-19 DIAGNOSIS — M75121 Complete rotator cuff tear or rupture of right shoulder, not specified as traumatic: Secondary | ICD-10-CM

## 2019-04-19 NOTE — Progress Notes (Signed)
Post-Op Visit Note   Patient: [name]           Date of Birth: [birth year]           MRN: [id number] Visit Date: 04/19/2019 PCP: Leah Passy, MD   Assessment & Plan:  Chief Complaint:  Chief Complaint  Patient presents with  . Right Shoulder - Follow-up   Visit Diagnoses:  1. Nontraumatic complete tear of right rotator cuff     Plan: [name] is a patient is now about 2 and half months out right shoulder arthroscopy biceps tenodesis mini open rotator cuff repair.  She is doing well.  She is in therapy 1 time a week.  She started doing strengthening training.  On exam she has good range of motion and very good strength.  No real coarse grinding or crepitus.  I am going to release her at this time.  No more than 5 pounds lifting for the next 6 weeks.  PT for strengthening to continue 1 time a week for about 4 weeks.  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  I will see her back as needed  Follow-Up Instructions: Return if symptoms worsen or fail to improve.   Orders:  No orders of the defined types were placed in this encounter.  No orders of the defined types were placed in this encounter.   Imaging: No results found.  PMFS History: Patient Active Problem List   Diagnosis Date Noted  . Elevated blood pressure reading without diagnosis of hypertension 01/13/2019  . Vaginal atrophy 12/15/2018  . Dyspnea on exertion 10/21/2018  . Family history of early CAD 07/08/2018  . Allergic rhinitis 06/25/2018  . Breast mass, right 05/25/2018  . Varicose veins of bilateral lower extremities with pain 06/09/2017  . Menopausal symptoms 02/16/2015  . Vitamin B 12 deficiency 04/11/2014  . Stress incontinence 08/31/2013  . Cervico-occipital neuralgia 06/01/2013  . Acute sinus infection 03/30/2013  . HLD (hyperlipidemia) 03/09/2012  . Migraine 12/31/2011  . Anxiety 12/31/2011  . Unspecified mood (affective) disorder ([place]) 12/31/2011  . Breast  cancer ([place]) 12/31/2011   Past Medical History:  Diagnosis Date  . Anemia   . Anxiety   . Blood transfusion without reported diagnosis   . Breast cancer ([place]) 02/27/2007   Right  . GERD (gastroesophageal reflux disease)   . Hypercholesterolemia   . Migraine   . Personal history of radiation therapy 2008   Right  . Wears dentures    partial upper    Family History  Problem Relation Age of Onset  . Thyroid disease Mother   . Hypertension Mother   . Hyperlipidemia Mother   . Alzheimer's disease Mother   . Heart failure Mother   . Esophageal cancer Father   . Hyperlipidemia Father   . Heart murmur Father   . Colon polyps Father   . Diabetes Sister   . Alzheimer's disease Maternal Aunt   . Alzheimer's disease Maternal Uncle   . Diabetes Paternal Uncle   . Alzheimer's disease Maternal Grandmother   . Diabetes Paternal Grandmother   . Breast cancer Sister   . Irritable bowel syndrome Sister   . Ulcerative colitis Sister        ????  . Breast cancer Cousin   . Breast cancer Cousin   . Colon cancer Neg Hx     Past Surgical History:  Procedure Laterality Date  . ABDOMINAL HYSTERECTOMY    .  BREAST BIOPSY Right 02/27/2007  . BREAST LUMPECTOMY Right 04/02/2007  . BREAST SURGERY  01/14/2013   breast reduction left side  . BROW LIFT Bilateral 06/17/2017   Procedure: BLEPHAROPLASTY UPPER EYELID WITH EXCESS SKIN;  Surgeon: Karle Starch, MD;  Location: [place];  Service: Ophthalmology;  Laterality: Bilateral;  . CESAREAN SECTION     two times  . COLONOSCOPY    . FOOT SURGERY Right   . PTOSIS REPAIR Bilateral 06/17/2017   Procedure: PTOSIS REPAIR RESECT EX;  Surgeon: Karle Starch, MD;  Location: [place];  Service: Ophthalmology;  Laterality: Bilateral;  . REDUCTION MAMMAPLASTY Left   . SHOULDER ARTHROSCOPY WITH ROTATOR CUFF REPAIR AND OPEN BICEPS TENODESIS Right 02/01/2019   Procedure: RIGHT SHOULDER ARTHROSCOPY, DEBRIDEMENT, BICEPS TENODESIS, MINI  ROTATOR CUFF TEAR REPAIR;  Surgeon: Meredith Pel, MD;  Location: [place];  Service: Orthopedics;  Laterality: Right;   Social History   Occupational History  . Not on file  Tobacco Use  . Smoking status: Never Smoker  . Smokeless tobacco: Never Used  Substance and Sexual Activity  . Alcohol use: No    Alcohol/week: 0.0 standard drinks  . Drug use: No  . Sexual activity: Yes    Partners: Male    Birth control/protection: Surgical

## 2019-04-19 NOTE — Therapy (Signed)
[facility] [address] [place], [place], [address] Phone: [phone]   Fax:  [phone]  Physical Therapy Treatment  Patient Details  Name: [name] MRN: [id number] Date of Birth: Dec 25, 1955 Referring Provider (PT): Marcene Duos MD   Encounter Date: 04/19/2019  PT End of Session - 04/19/19 0810    Visit Number  9    Number of Visits  17    Date for PT Re-Evaluation  06/07/19    PT Start Time  0800    PT Stop Time  4765    PT Time Calculation (min)  55 min    Activity Tolerance  Patient tolerated treatment well    Behavior During Therapy  [facility] for tasks assessed/performed       Past Medical History:  Diagnosis Date  . Anemia   . Anxiety   . Blood transfusion without reported diagnosis   . Breast cancer ([place]) 02/27/2007   Right  . GERD (gastroesophageal reflux disease)   . Hypercholesterolemia   . Migraine   . Personal history of radiation therapy 2008   Right  . Wears dentures    partial upper    Past Surgical History:  Procedure Laterality Date  . ABDOMINAL HYSTERECTOMY    . BREAST BIOPSY Right 02/27/2007  . BREAST LUMPECTOMY Right 04/02/2007  . BREAST SURGERY  01/14/2013   breast reduction left side  . BROW LIFT Bilateral 06/17/2017   Procedure: BLEPHAROPLASTY UPPER EYELID WITH EXCESS SKIN;  Surgeon: Karle Starch, MD;  Location: [place];  Service: Ophthalmology;  Laterality: Bilateral;  . CESAREAN SECTION     two times  . COLONOSCOPY    . FOOT SURGERY Right   . PTOSIS REPAIR Bilateral 06/17/2017   Procedure: PTOSIS REPAIR RESECT EX;  Surgeon: Karle Starch, MD;  Location: [place];  Service: Ophthalmology;  Laterality: Bilateral;  . REDUCTION MAMMAPLASTY Left   . SHOULDER ARTHROSCOPY WITH ROTATOR CUFF REPAIR AND OPEN BICEPS TENODESIS Right 02/01/2019   Procedure: RIGHT SHOULDER ARTHROSCOPY, DEBRIDEMENT, BICEPS TENODESIS, MINI ROTATOR CUFF TEAR REPAIR;  Surgeon: Meredith Pel, MD;  Location: [place];  Service: Orthopedics;  Laterality: Right;    There were no vitals filed for this visit.  Subjective Assessment - 04/19/19 0807    Subjective  Patient states that she had a hectic weekend with her husband returning to the hospital and did not get much of her HEP done.    Currently in Pain?  Yes    Pain Score  2     Pain Location  Shoulder    Pain Orientation  Right    Pain Descriptors / Indicators  Sore    Pain Onset  More than a month ago      TREATMENT Manual Therapy: Mobilizations with movement: flexion, abduction, ER x20 each A-P and inferior, grade III  Neuromuscular Re-education: Controlled articular rotations for improved coordination without compensation: elbow x6, scapular x5 (50% MVC) RTB Scapular strengthening:  Rows 15x 2 sec hold with VC and TCs for scapular/proximal initiation Shrug with eccentirc lower 10x (1:4) for improved scapular control Straight arm lat-pull down 10x with emphasis on scapular control throughout ROM  Therapeutic Exercise: AAROM, wand: flexion 15 x 10 sec     : abduction 15 x 10 sec    : ER 15 x 10 sec  Patient educated throughout session on appropriate technique and form using multi-modal cueing, HEP, and activity modification. Patient articulated understanding and  returned demonstration.  Patient Response to interventions: Patient denies increased or resting pain.  ASSESSMENT Patient presents to clinic with excellent motivation to participate in therapy. Patient demonstrates deficits in R shoulder AROM, strength, function as evidenced by increased compensatory patterns at end ranges of motion. Patient able to coordinate basic scapular control exercises against resistance during today's session and responded positively to neuromuscular re-education interventions. Patient will benefit from continued skilled therapeutic intervention to address remaining deficits in R shoulder AROM, strength,  function in order to participate fully in work and community activities, increase function, and improve overall QOL.     PT Long Term Goals - 04/12/19 0818      PT LONG TERM GOAL #1   Title  Patient will be independent with HEP in order to improve strength and decrease pain in order to improve pain-free function at home and work.    Baseline  IE: provided    Time  4    Period  Weeks    Status  Achieved      PT LONG TERM GOAL #2   Title  Patient will decrease worst pain as reported on NPRS by at least 3 points in order to demonstrate clinically significant reduction in pain.    Baseline  IE: 10/10; 04/12/2019: 4/10    Time  4    Period  Weeks    Status  Achieved      PT LONG TERM GOAL #3   Title  Patient will indicate a score of 71 or greater on FOTO (Quick Dash) to demonstrate clinically significant improvement in function of RUE for improved independence.    Baseline  IE: 56; 04/12/2019: 64    Time  8    Period  Weeks    Status  On-going    Target Date  06/07/19      PT LONG TERM GOAL #4   Title  Patient will demonstrate AROM of R shoulder WFLs (abduction> 120, flexion > 160, IR >65, ER> 65) in order to demonstrate ability to return to participation in [place] activities without limitation.    Baseline  IE: flexion 147, abduction 84, IR 58, ER 46; 04/12/2019: flexion 164, abduction 162, IR 74, ER 60    Time  8    Period  Weeks    Status  On-going    Target Date  06/07/19      PT LONG TERM GOAL #5   Title  Patient will demonstrate gross RUE 5/5 strength during MMT throughout AROM indicating complete recovery form surgery and safe return to participation in activities.    Baseline  04/12/2019: 3/5    Time  8    Period  Weeks    Status  New    Target Date  06/07/19            Plan - 04/19/19 6384    Clinical Impression Statement  Patient presents to clinic with excellent motivation to participate in therapy. Patient demonstrates deficits in R shoulder AROM, strength,  function as evidenced by increased compensatory patterns at end ranges of motion. Patient able to coordinate basic scapular control exercises against resistance during today's session and responded positively to neuromuscular re-education interventions. Patient will benefit from continued skilled therapeutic intervention to address remaining deficits in R shoulder AROM, strength, function in order to participate fully in work and community activities, increase function, and improve overall QOL.    Personal Factors and Comorbidities  Age;Sex;Behavior Pattern;Fitness;Past/Current Experience;Time since onset of injury/illness/exacerbation;Comorbidity 3+  Comorbidities  anemia, anxiety, hx of R breast cancer, migraines    Examination-Activity Limitations  Bathing;Hygiene/Grooming;Lift;Reach Overhead;Carry;Dressing;Sleep    Examination-Participation Restrictions  Laundry;Shop;Cleaning;Community Activity;Yard Work    [employer]  Evolving/Moderate complexity    Rehab Potential  Good    PT Frequency  1x / week    PT Duration  8 weeks    PT Treatment/Interventions  ADLs/Self Care Home Management;Aquatic Therapy;Cryotherapy;Electrical Stimulation;Moist Heat;Therapeutic activities;Therapeutic exercise;Balance training;Stair training;Gait training;Functional mobility training;Neuromuscular re-education;Patient/family education;Manual techniques;Scar mobilization;Passive range of motion;Dry needling;Taping;Spinal Manipulations;Joint Manipulations    PT Next Visit Plan  PAILs and RAILs shoulder ER    Consulted and Agree with Plan of Care  Patient       Patient will benefit from skilled therapeutic intervention in order to improve the following deficits and impairments:  Decreased mobility, Hypomobility, Increased muscle spasms, Improper body mechanics, Decreased scar mobility, Decreased activity tolerance, Decreased strength, Impaired flexibility, Impaired UE functional use, Postural  dysfunction, Pain  Visit Diagnosis: 1. Right shoulder pain, unspecified chronicity   2. Muscle weakness (generalized)   3. Decreased ROM of right shoulder        Problem List Patient Active Problem List   Diagnosis Date Noted  . Elevated blood pressure reading without diagnosis of hypertension 01/13/2019  . Vaginal atrophy 12/15/2018  . Dyspnea on exertion 10/21/2018  . Family history of early CAD 07/08/2018  . Allergic rhinitis 06/25/2018  . Breast mass, right 05/25/2018  . Varicose veins of bilateral lower extremities with pain 06/09/2017  . Menopausal symptoms 02/16/2015  . Vitamin B 12 deficiency 04/11/2014  . Stress incontinence 08/31/2013  . Cervico-occipital neuralgia 06/01/2013  . Acute sinus infection 03/30/2013  . HLD (hyperlipidemia) 03/09/2012  . Migraine 12/31/2011  . Anxiety 12/31/2011  . Unspecified mood (affective) disorder ([place]) 12/31/2011  . Breast cancer ([place]) 12/31/2011    Myles Gip PT, DPT [phone] 04/19/2019, 1:22 PM  [facility] [address] [place], [place], [address] Phone: [phone]   Fax:  [phone]  Name: [name] MRN: [id number] Date of Birth: 01-11-1956

## 2019-04-21 ENCOUNTER — Encounter: Payer: PRIVATE HEALTH INSURANCE | Admitting: Physical Therapy

## 2019-04-26 ENCOUNTER — Other Ambulatory Visit: Payer: Self-pay

## 2019-04-26 ENCOUNTER — Encounter: Payer: Self-pay | Admitting: Physical Therapy

## 2019-04-26 ENCOUNTER — Ambulatory Visit: Payer: PRIVATE HEALTH INSURANCE | Admitting: Physical Therapy

## 2019-04-26 DIAGNOSIS — M25511 Pain in right shoulder: Secondary | ICD-10-CM | POA: Diagnosis not present

## 2019-04-26 DIAGNOSIS — M6281 Muscle weakness (generalized): Secondary | ICD-10-CM

## 2019-04-26 DIAGNOSIS — M25611 Stiffness of right shoulder, not elsewhere classified: Secondary | ICD-10-CM

## 2019-04-26 NOTE — Therapy (Signed)
[facility] [facility] [address] [place], [place], [address] Phone: [phone]   Fax:  [phone]  Physical Therapy Treatment  Patient Details  Name: [name] MRN: [id number] Date of Birth: June 16, 1956 Referring Provider (PT): Marcene Duos MD   Encounter Date: 04/26/2019  PT End of Session - 04/26/19 0810    Visit Number  10    Number of Visits  17    Date for PT Re-Evaluation  06/07/19    PT Start Time  0800    PT Stop Time  0854    PT Time Calculation (min)  54 min    Activity Tolerance  Patient tolerated treatment well    Behavior During Therapy  [facility] for tasks assessed/performed       Past Medical History:  Diagnosis Date  . Anemia   . Anxiety   . Blood transfusion without reported diagnosis   . Breast cancer ([place]) 02/27/2007   Right  . GERD (gastroesophageal reflux disease)   . Hypercholesterolemia   . Migraine   . Personal history of radiation therapy 2008   Right  . Wears dentures    partial upper    Past Surgical History:  Procedure Laterality Date  . ABDOMINAL HYSTERECTOMY    . BREAST BIOPSY Right 02/27/2007  . BREAST LUMPECTOMY Right 04/02/2007  . BREAST SURGERY  01/14/2013   breast reduction left side  . BROW LIFT Bilateral 06/17/2017   Procedure: BLEPHAROPLASTY UPPER EYELID WITH EXCESS SKIN;  Surgeon: Karle Starch, MD;  Location: [place];  Service: Ophthalmology;  Laterality: Bilateral;  . CESAREAN SECTION     two times  . COLONOSCOPY    . FOOT SURGERY Right   . PTOSIS REPAIR Bilateral 06/17/2017   Procedure: PTOSIS REPAIR RESECT EX;  Surgeon: Karle Starch, MD;  Location: [place];  Service: Ophthalmology;  Laterality: Bilateral;  . REDUCTION MAMMAPLASTY Left   . SHOULDER ARTHROSCOPY WITH ROTATOR CUFF REPAIR AND OPEN BICEPS TENODESIS Right 02/01/2019   Procedure: RIGHT SHOULDER ARTHROSCOPY, DEBRIDEMENT, BICEPS TENODESIS, MINI ROTATOR CUFF TEAR REPAIR;  Surgeon: Meredith Pel, MD;  Location: [place];  Service: Orthopedics;  Laterality: Right;    There were no vitals filed for this visit.  Subjective Assessment - 04/26/19 0806    Subjective  Patient notes some increased swelling in the supraclavicular region when she does increased activity. She denies any other concerns. States that she is still not allowed to lift > 5 pounds from above shoulder height per MD.    Pain Onset  More than a month ago       TREATMENT Manual Therapy: Mobilizations with movement: flexion, abduction, ER x20 each A-P and inferior, grade III  Neuromuscular Re-education: Controlled articular rotations for improved coordination without compensation: scapular x5 (50% MVC) RTB Scapular strengthening:  PNF D2 supine 2x10 Rows 15x 2 sec hold with VC and TCs for scapular/proximal initiation Adbuction 2x15 Adduction 2x15  Therapeutic Exercise: AAROM, wand: flexion 15 x 10 sec     : abduction 15 x 10 sec    : ER 15 x 10 sec Sidelying ER with RTB, 2x10  Patient educated throughout session on appropriate technique and form using multi-modal cueing, HEP, and activity modification. Patient articulated understanding and returned demonstration.  Patient Response to interventions: Patient denies increased or resting pain.  ASSESSMENT Patient presents to clinic with excellent motivation to participate in therapy. Patient demonstrates deficits in R shoulder AROM, strength, function as  evidenced by increased compensatory patterns at end ranges of motion. Patient able to coordinate basic scapular control exercises against resistance with increased repetitions and decreased cueing during today's session and responded positively to neuromuscular re-education interventions. Patient will benefit from continued skilled therapeutic intervention to address remaining deficits in R shoulder AROM, strength, function in order to participate fully in work and community activities,  increase function, and improve overall QOL.               PT Long Term Goals - 04/12/19 0818      PT LONG TERM GOAL #1   Title  Patient will be independent with HEP in order to improve strength and decrease pain in order to improve pain-free function at home and work.    Baseline  IE: provided    Time  4    Period  Weeks    Status  Achieved      PT LONG TERM GOAL #2   Title  Patient will decrease worst pain as reported on NPRS by at least 3 points in order to demonstrate clinically significant reduction in pain.    Baseline  IE: 10/10; 04/12/2019: 4/10    Time  4    Period  Weeks    Status  Achieved      PT LONG TERM GOAL #3   Title  Patient will indicate a score of 71 or greater on FOTO (Quick Dash) to demonstrate clinically significant improvement in function of RUE for improved independence.    Baseline  IE: 56; 04/12/2019: 64    Time  8    Period  Weeks    Status  On-going    Target Date  06/07/19      PT LONG TERM GOAL #4   Title  Patient will demonstrate AROM of R shoulder WFLs (abduction> 120, flexion > 160, IR >65, ER> 65) in order to demonstrate ability to return to participation in [place] activities without limitation.    Baseline  IE: flexion 147, abduction 84, IR 58, ER 46; 04/12/2019: flexion 164, abduction 162, IR 74, ER 60    Time  8    Period  Weeks    Status  On-going    Target Date  06/07/19      PT LONG TERM GOAL #5   Title  Patient will demonstrate gross RUE 5/5 strength during MMT throughout AROM indicating complete recovery form surgery and safe return to participation in activities.    Baseline  04/12/2019: 3/5    Time  8    Period  Weeks    Status  New    Target Date  06/07/19            Plan - 04/26/19 0857    Clinical Impression Statement  Patient presents to clinic with excellent motivation to participate in therapy. Patient demonstrates deficits in R shoulder AROM, strength, function as evidenced by increased compensatory patterns  at end ranges of motion. Patient able to coordinate basic scapular control exercises against resistance with increased repetitions and decreased cueing during today's session and responded positively to neuromuscular re-education interventions. Patient will benefit from continued skilled therapeutic intervention to address remaining deficits in R shoulder AROM, strength, function in order to participate fully in work and community activities, increase function, and improve overall QOL.    Personal Factors and Comorbidities  Age;Sex;Behavior Pattern;Fitness;Past/Current Experience;Time since onset of injury/illness/exacerbation;Comorbidity 3+    Comorbidities  anemia, anxiety, hx of R breast cancer, migraines  Examination-Activity Limitations  Bathing;Hygiene/Grooming;Lift;Reach Overhead;Carry;Dressing;Sleep    Examination-Participation Restrictions  Laundry;Shop;Cleaning;Community Activity;Yard Work    [employer] officer  Evolving/Moderate complexity    Rehab Potential  Good    PT Frequency  1x / week    PT Duration  8 weeks    PT Treatment/Interventions  ADLs/Self Care Home Management;Aquatic Therapy;Cryotherapy;Electrical Stimulation;Moist Heat;Therapeutic activities;Therapeutic exercise;Balance training;Stair training;Gait training;Functional mobility training;Neuromuscular re-education;Patient/family education;Manual techniques;Scar mobilization;Passive range of motion;Dry needling;Taping;Spinal Manipulations;Joint Manipulations    PT Next Visit Plan  PAILs and RAILs shoulder ER    Consulted and Agree with Plan of Care  Patient       Patient will benefit from skilled therapeutic intervention in order to improve the following deficits and impairments:  Decreased mobility, Hypomobility, Increased muscle spasms, Improper body mechanics, Decreased scar mobility, Decreased activity tolerance, Decreased strength, Impaired flexibility, Impaired UE functional use, Postural dysfunction,  Pain  Visit Diagnosis: 1. Right shoulder pain, unspecified chronicity   2. Muscle weakness (generalized)   3. Decreased ROM of right shoulder        Problem List Patient Active Problem List   Diagnosis Date Noted  . Elevated blood pressure reading without diagnosis of hypertension 01/13/2019  . Vaginal atrophy 12/15/2018  . Dyspnea on exertion 10/21/2018  . Family history of early CAD 07/08/2018  . Allergic rhinitis 06/25/2018  . Breast mass, right 05/25/2018  . Varicose veins of bilateral lower extremities with pain 06/09/2017  . Menopausal symptoms 02/16/2015  . Vitamin B 12 deficiency 04/11/2014  . Stress incontinence 08/31/2013  . Cervico-occipital neuralgia 06/01/2013  . Acute sinus infection 03/30/2013  . HLD (hyperlipidemia) 03/09/2012  . Migraine 12/31/2011  . Anxiety 12/31/2011  . Unspecified mood (affective) disorder ([place]) 12/31/2011  . Breast cancer [facility]) 12/31/2011   Myles Gip PT, DPT [phone] 04/26/2019, 8:58 AM  [facility] [facility] [address] [place], [place], [address] Phone: [phone]   Fax:  [phone]  Name: [name] MRN: [id number] Date of Birth: [birth year]

## 2019-04-30 ENCOUNTER — Encounter: Payer: Self-pay | Admitting: Family Medicine

## 2019-04-30 ENCOUNTER — Other Ambulatory Visit: Payer: Self-pay

## 2019-04-30 MED ORDER — PREDNISONE 10 MG PO TABS: ORAL_TABLET | ORAL | 0 refills | Status: DC

## 2019-04-30 NOTE — Progress Notes (Signed)
Per TA ok to send in pred taper/txh dmf

## 2019-05-03 ENCOUNTER — Encounter: Payer: PRIVATE HEALTH INSURANCE | Admitting: Physical Therapy

## 2019-05-07 ENCOUNTER — Other Ambulatory Visit: Payer: Self-pay

## 2019-05-07 ENCOUNTER — Encounter: Payer: Self-pay | Admitting: Physical Therapy

## 2019-05-07 ENCOUNTER — Ambulatory Visit: Payer: PRIVATE HEALTH INSURANCE | Admitting: Physical Therapy

## 2019-05-07 DIAGNOSIS — M25511 Pain in right shoulder: Secondary | ICD-10-CM

## 2019-05-07 DIAGNOSIS — M25611 Stiffness of right shoulder, not elsewhere classified: Secondary | ICD-10-CM

## 2019-05-07 DIAGNOSIS — M6281 Muscle weakness (generalized): Secondary | ICD-10-CM

## 2019-05-07 NOTE — Therapy (Signed)
[facility] [address] [place], [place], [address] Phone: [phone]   Fax:  [phone]  Physical Therapy Treatment  Patient Details  Name: [name] MRN: [id number] Date of Birth: 1956-05-03 Referring Provider (PT): Marcene Duos MD   Encounter Date: 05/07/2019  PT End of Session - 05/07/19 1005    Visit Number  11    Number of Visits  17    Date for PT Re-Evaluation  06/07/19    PT Start Time  0900    PT Stop Time  0945    PT Time Calculation (min)  45 min    Activity Tolerance  Patient tolerated treatment well    Behavior During Therapy  [facility] for tasks assessed/performed       Past Medical History:  Diagnosis Date  . Anemia   . Anxiety   . Blood transfusion without reported diagnosis   . Breast cancer ([place]) 02/27/2007   Right  . GERD (gastroesophageal reflux disease)   . Hypercholesterolemia   . Migraine   . Personal history of radiation therapy 2008   Right  . Wears dentures    partial upper    Past Surgical History:  Procedure Laterality Date  . ABDOMINAL HYSTERECTOMY    . BREAST BIOPSY Right 02/27/2007  . BREAST LUMPECTOMY Right 04/02/2007  . BREAST SURGERY  01/14/2013   breast reduction left side  . BROW LIFT Bilateral 06/17/2017   Procedure: BLEPHAROPLASTY UPPER EYELID WITH EXCESS SKIN;  Surgeon: Karle Starch, MD;  Location: [place];  Service: Ophthalmology;  Laterality: Bilateral;  . CESAREAN SECTION     two times  . COLONOSCOPY    . FOOT SURGERY Right   . PTOSIS REPAIR Bilateral 06/17/2017   Procedure: PTOSIS REPAIR RESECT EX;  Surgeon: Karle Starch, MD;  Location: [place];  Service: Ophthalmology;  Laterality: Bilateral;  . REDUCTION MAMMAPLASTY Left   . SHOULDER ARTHROSCOPY WITH ROTATOR CUFF REPAIR AND OPEN BICEPS TENODESIS Right 02/01/2019   Procedure: RIGHT SHOULDER ARTHROSCOPY, DEBRIDEMENT, BICEPS TENODESIS, MINI ROTATOR CUFF TEAR REPAIR;  Surgeon: Meredith Pel, MD;  Location: [place];  Service: Orthopedics;  Laterality: Right;    There were no vitals filed for this visit.  Subjective Assessment - 05/07/19 1011    Subjective  Patient reports that she overdid it on Wednesday when helping an elderly family member with household chores. She states that her shoulder felt fine while performing the tasks (cleaning baseboards, mopping, sweeping, etc), but later in the evening she was barely able to move her arm and she noted increased swelling superior to the clavicle. She reports that she has limited her demands of the RUE since Wednesday and used ice and OTC pain meds as needed and it is feeling much better. However, she does still have some swelling and soreness along the path of the supraspinatus.     TREATMENT  Pre-treatment assessment: visible swelling superior to the clavicle, AROM maintained  Manual Therapy: STM and TPR performed to R UT, R supraspinatus to allow for decreased tension and pain and improved posture and function Mobilizations with movement: flexion, abduction, ER x20 each A-P and inferior, grade III  Neuromuscular Re-education: AROM flexion, abduction, ER, IR x20 each with eccentric return for improved coordination and decreased muscle spasm Rhythmic stabilization, 45 sec bouts x3 each:  90 degrees elbow flexion : 0 degrees shoulder flexion  0 degrees elbow flexion: 30 degrees shoulder scaption  0 degrees  elbow flexion: 60 degrees shoulder scaption Reviewed HEP and added Blackburn series for shoulder/scapular strengthening and motor control. Patient demonstrated and articulated understanding.    Patient educated throughout session on appropriate technique and form using multi-modal cueing, HEP, and activity modification. Patient articulated understanding and returned demonstration.  Patient Response to interventions: Patient reports decreased stiffness and pain.  ASSESSMENT Patient presents to clinic  with excellent motivation to participate in therapy. Patient demonstrates deficits in R shoulder AROM, strength, function as evidenced by increased compensatory patterns at end ranges of motion. Patient able to decrease pain and soreness during today's session and responded positively to manual interventions. Patient will benefit from continued skilled therapeutic intervention to address remaining deficits in R shoulder AROM, strength, function in order to participate fully in work and community activities, increase function, and improve overall QOL.        PT Long Term Goals - 04/12/19 0818      PT LONG TERM GOAL #1   Title  Patient will be independent with HEP in order to improve strength and decrease pain in order to improve pain-free function at home and work.    Baseline  IE: provided    Time  4    Period  Weeks    Status  Achieved      PT LONG TERM GOAL #2   Title  Patient will decrease worst pain as reported on NPRS by at least 3 points in order to demonstrate clinically significant reduction in pain.    Baseline  IE: 10/10; 04/12/2019: 4/10    Time  4    Period  Weeks    Status  Achieved      PT LONG TERM GOAL #3   Title  Patient will indicate a score of 71 or greater on FOTO (Quick Dash) to demonstrate clinically significant improvement in function of RUE for improved independence.    Baseline  IE: 56; 04/12/2019: 64    Time  8    Period  Weeks    Status  On-going    Target Date  06/07/19      PT LONG TERM GOAL #4   Title  Patient will demonstrate AROM of R shoulder WFLs (abduction> 120, flexion > 160, IR >65, ER> 65) in order to demonstrate ability to return to participation in [place] activities without limitation.    Baseline  IE: flexion 147, abduction 84, IR 58, ER 46; 04/12/2019: flexion 164, abduction 162, IR 74, ER 60    Time  8    Period  Weeks    Status  On-going    Target Date  06/07/19      PT LONG TERM GOAL #5   Title  Patient will demonstrate gross RUE 5/5  strength during MMT throughout AROM indicating complete recovery form surgery and safe return to participation in activities.    Baseline  04/12/2019: 3/5    Time  8    Period  Weeks    Status  New    Target Date  06/07/19            Plan - 05/07/19 1005    Clinical Impression Statement  Patient presents to clinic with excellent motivation to participate in therapy. Patient demonstrates deficits in R shoulder AROM, strength, function as evidenced by increased compensatory patterns at end ranges of motion. Patient able to decrease pain and soreness during today's session and responded positively to manual interventions. Patient will benefit from continued skilled therapeutic intervention to address remaining  deficits in R shoulder AROM, strength, function in order to participate fully in work and community activities, increase function, and improve overall QOL.    Personal Factors and Comorbidities  Age;Sex;Behavior Pattern;Fitness;Past/Current Experience;Time since onset of injury/illness/exacerbation;Comorbidity 3+    Comorbidities  anemia, anxiety, hx of R breast cancer, migraines    Examination-Activity Limitations  Bathing;Hygiene/Grooming;Lift;Reach Overhead;Carry;Dressing;Sleep    Examination-Participation Restrictions  Laundry;Shop;Cleaning;Community Activity;Yard Work    [employer]  Evolving/Moderate complexity    Rehab Potential  Good    PT Frequency  1x / week    PT Duration  8 weeks    PT Treatment/Interventions  ADLs/Self Care Home Management;Aquatic Therapy;Cryotherapy;Electrical Stimulation;Moist Heat;Therapeutic activities;Therapeutic exercise;Balance training;Stair training;Gait training;Functional mobility training;Neuromuscular re-education;Patient/family education;Manual techniques;Scar mobilization;Passive range of motion;Dry needling;Taping;Spinal Manipulations;Joint Manipulations    PT Next Visit Plan  PAILs and RAILs shoulder ER    Consulted and  Agree with Plan of Care  Patient       Patient will benefit from skilled therapeutic intervention in order to improve the following deficits and impairments:  Decreased mobility, Hypomobility, Increased muscle spasms, Improper body mechanics, Decreased scar mobility, Decreased activity tolerance, Decreased strength, Impaired flexibility, Impaired UE functional use, Postural dysfunction, Pain  Visit Diagnosis: Right shoulder pain, unspecified chronicity  Muscle weakness (generalized)  Decreased ROM of right shoulder     Problem List Patient Active Problem List   Diagnosis Date Noted  . Elevated blood pressure reading without diagnosis of hypertension 01/13/2019  . Vaginal atrophy 12/15/2018  . Dyspnea on exertion 10/21/2018  . Family history of early CAD 07/08/2018  . Allergic rhinitis 06/25/2018  . Breast mass, right 05/25/2018  . Varicose veins of bilateral lower extremities with pain 06/09/2017  . Menopausal symptoms 02/16/2015  . Vitamin B 12 deficiency 04/11/2014  . Stress incontinence 08/31/2013  . Cervico-occipital neuralgia 06/01/2013  . Acute sinus infection 03/30/2013  . HLD (hyperlipidemia) 03/09/2012  . Migraine 12/31/2011  . Anxiety 12/31/2011  . Unspecified mood (affective) disorder ([place]) 12/31/2011  . Breast cancer [facility]) 12/31/2011   Myles Gip PT, DPT [phone] 05/07/2019, 10:13 AM  [facility] [address] [place], [place], [address] Phone: [phone]   Fax:  [phone]  Name: MYRIAH BUSHAW MRN: [id number] Date of Birth: [birth year]

## 2019-05-10 ENCOUNTER — Encounter: Payer: PRIVATE HEALTH INSURANCE | Admitting: Physical Therapy

## 2019-05-12 ENCOUNTER — Other Ambulatory Visit: Payer: Self-pay

## 2019-05-12 ENCOUNTER — Ambulatory Visit: Payer: PRIVATE HEALTH INSURANCE | Admitting: Physical Therapy

## 2019-05-12 ENCOUNTER — Encounter: Payer: Self-pay | Admitting: Physical Therapy

## 2019-05-12 DIAGNOSIS — M25511 Pain in right shoulder: Secondary | ICD-10-CM | POA: Diagnosis not present

## 2019-05-12 DIAGNOSIS — M25611 Stiffness of right shoulder, not elsewhere classified: Secondary | ICD-10-CM

## 2019-05-12 DIAGNOSIS — M6281 Muscle weakness (generalized): Secondary | ICD-10-CM

## 2019-05-12 NOTE — Therapy (Signed)
[facility] [address] [place], [place], [address] Phone: [phone]   Fax:  [phone]  Physical Therapy Treatment  Patient Details  Name: [name] MRN: [id number] Date of Birth: 09/05/1956 Referring Provider (PT): Marcene Duos MD   Encounter Date: 05/12/2019  PT End of Session - 05/12/19 0826    Visit Number  12    Number of Visits  17    Date for PT Re-Evaluation  06/07/19    PT Start Time  0759    PT Stop Time  0824    PT Time Calculation (min)  25 min    Activity Tolerance  Patient tolerated treatment well    Behavior During Therapy  [facility] for tasks assessed/performed       Past Medical History:  Diagnosis Date  . Anemia   . Anxiety   . Blood transfusion without reported diagnosis   . Breast cancer ([place]) 02/27/2007   Right  . GERD (gastroesophageal reflux disease)   . Hypercholesterolemia   . Migraine   . Personal history of radiation therapy 2008   Right  . Wears dentures    partial upper    Past Surgical History:  Procedure Laterality Date  . ABDOMINAL HYSTERECTOMY    . BREAST BIOPSY Right 02/27/2007  . BREAST LUMPECTOMY Right 04/02/2007  . BREAST SURGERY  01/14/2013   breast reduction left side  . BROW LIFT Bilateral 06/17/2017   Procedure: BLEPHAROPLASTY UPPER EYELID WITH EXCESS SKIN;  Surgeon: Karle Starch, MD;  Location: [place];  Service: Ophthalmology;  Laterality: Bilateral;  . CESAREAN SECTION     two times  . COLONOSCOPY    . FOOT SURGERY Right   . PTOSIS REPAIR Bilateral 06/17/2017   Procedure: PTOSIS REPAIR RESECT EX;  Surgeon: Karle Starch, MD;  Location: [place];  Service: Ophthalmology;  Laterality: Bilateral;  . REDUCTION MAMMAPLASTY Left   . SHOULDER ARTHROSCOPY WITH ROTATOR CUFF REPAIR AND OPEN BICEPS TENODESIS Right 02/01/2019   Procedure: RIGHT SHOULDER ARTHROSCOPY, DEBRIDEMENT, BICEPS TENODESIS, MINI ROTATOR CUFF TEAR REPAIR;  Surgeon: Meredith Pel, MD;  Location: [place];  Service: Orthopedics;  Laterality: Right;    There were no vitals filed for this visit.  Subjective Assessment - 05/12/19 0803    Subjective  Patient states that she feels ready for a transition to self-management/discharge. Patient states that she is able to do all exercises and manage well. She reports good strategies for when she does have increased soreness after increased activity. Patient denies any questions or significant concerns.    Currently in Pain?  No/denies       TREATMENT  Neuromuscular Re-education: Blackburn shoulder series, x8 each to ensure correct form and scapular initiation Reviewed strategies for managing pain and shoulder stiffness after increased bouts of activity.   Post-treatment assessment: grossly symmetrical shoulder flexion, abduction, ER, IR with minimal compensatory patterns (minor R elbow flexion at end range flexion and abduction)  Patient educated throughout session on appropriate technique and form using multi-modal cueing, HEP, and activity modification. Patient articulated understanding and returned demonstration.  Patient Response to interventions: Patient denies any pain/soreness.   ASSESSMENT Patient presents to clinic with excellent motivation to participate in therapy and feels empowered to transition to self-management. Patient demonstrates minimal to no deficits in R shoulder AROM, strength, function. Patient able to achieve good quality of movement performing Blackburn shoulder series during today's session. Patient will benefit from trial self-management period  of two weeks with possible continuation of skilled therapeutic intervention to address any remaining/re-emerging deficits in R shoulder AROM, strength, function in order to ensure safe discharge from care.    PT Long Term Goals - 04/12/19 0818      PT LONG TERM GOAL #1   Title  Patient will be independent with HEP in order to  improve strength and decrease pain in order to improve pain-free function at home and work.    Baseline  IE: provided    Time  4    Period  Weeks    Status  Achieved      PT LONG TERM GOAL #2   Title  Patient will decrease worst pain as reported on NPRS by at least 3 points in order to demonstrate clinically significant reduction in pain.    Baseline  IE: 10/10; 04/12/2019: 4/10    Time  4    Period  Weeks    Status  Achieved      PT LONG TERM GOAL #3   Title  Patient will indicate a score of 71 or greater on FOTO (Quick Dash) to demonstrate clinically significant improvement in function of RUE for improved independence.    Baseline  IE: 56; 04/12/2019: 64    Time  8    Period  Weeks    Status  On-going    Target Date  06/07/19      PT LONG TERM GOAL #4   Title  Patient will demonstrate AROM of R shoulder WFLs (abduction> 120, flexion > 160, IR >65, ER> 65) in order to demonstrate ability to return to participation in [place] activities without limitation.    Baseline  IE: flexion 147, abduction 84, IR 58, ER 46; 04/12/2019: flexion 164, abduction 162, IR 74, ER 60    Time  8    Period  Weeks    Status  On-going    Target Date  06/07/19      PT LONG TERM GOAL #5   Title  Patient will demonstrate gross RUE 5/5 strength during MMT throughout AROM indicating complete recovery form surgery and safe return to participation in activities.    Baseline  04/12/2019: 3/5    Time  8    Period  Weeks    Status  New    Target Date  06/07/19            Plan - 05/12/19 [id number]    Clinical Impression Statement  Patient presents to clinic with excellent motivation to participate in therapy and feels empowered to transition to self-management. Patient demonstrates minimal to no deficits in R shoulder AROM, strength, function. Patient able to achieve good quality of movement performing Blackburn shoulder series during today's session. Patient will benefit from trial self-management period of two  weeks with possible continuation of skilled therapeutic intervention to address any remaining/re-emerging deficits in R shoulder AROM, strength, function in order to ensure safe discharge from care.    Personal Factors and Comorbidities  Age;Sex;Behavior Pattern;Fitness;Past/Current Experience;Time since onset of injury/illness/exacerbation;Comorbidity 3+    Comorbidities  anemia, anxiety, hx of R breast cancer, migraines    Examination-Activity Limitations  Bathing;Hygiene/Grooming;Lift;Reach Overhead;Carry;Dressing;Sleep    Examination-Participation Restrictions  Laundry;Shop;Cleaning;Community Activity;Yard Work    [employer] officer  Evolving/Moderate complexity    Rehab Potential  Good    PT Frequency  1x / week    PT Duration  8 weeks    PT Treatment/Interventions  ADLs/Self Care Home Management;Aquatic Therapy;Cryotherapy;Electrical Stimulation;Moist Heat;Therapeutic activities;Therapeutic exercise;Balance  training;Stair training;Gait training;Functional mobility training;Neuromuscular re-education;Patient/family education;Manual techniques;Scar mobilization;Passive range of motion;Dry needling;Taping;Spinal Manipulations;Joint Manipulations    PT Next Visit Plan  D/C    Consulted and Agree with Plan of Care  Patient       Patient will benefit from skilled therapeutic intervention in order to improve the following deficits and impairments:  Decreased mobility, Hypomobility, Increased muscle spasms, Improper body mechanics, Decreased scar mobility, Decreased activity tolerance, Decreased strength, Impaired flexibility, Impaired UE functional use, Postural dysfunction, Pain  Visit Diagnosis: Right shoulder pain, unspecified chronicity  Muscle weakness (generalized)  Decreased ROM of right shoulder     Problem List Patient Active Problem List   Diagnosis Date Noted  . Elevated blood pressure reading without diagnosis of hypertension 01/13/2019  . Vaginal atrophy  12/15/2018  . Dyspnea on exertion 10/21/2018  . Family history of early CAD 07/08/2018  . Allergic rhinitis 06/25/2018  . Breast mass, right 05/25/2018  . Varicose veins of bilateral lower extremities with pain 06/09/2017  . Menopausal symptoms 02/16/2015  . Vitamin B 12 deficiency 04/11/2014  . Stress incontinence 08/31/2013  . Cervico-occipital neuralgia 06/01/2013  . Acute sinus infection 03/30/2013  . HLD (hyperlipidemia) 03/09/2012  . Migraine 12/31/2011  . Anxiety 12/31/2011  . Unspecified mood (affective) disorder ([place]) 12/31/2011  . Breast cancer ([place]) 12/31/2011   Myles Gip PT, DPT [phone] 05/12/2019, 8:29 AM  [facility] [address] [place], [place], [address] Phone: [phone]   Fax:  [phone]  Name: [name] MRN: [id number] Date of Birth: 02-17-1956

## 2019-05-17 ENCOUNTER — Encounter: Payer: PRIVATE HEALTH INSURANCE | Admitting: Physical Therapy

## 2019-05-20 ENCOUNTER — Encounter: Payer: Self-pay | Admitting: Family Medicine

## 2019-05-20 ENCOUNTER — Other Ambulatory Visit: Payer: Self-pay | Admitting: Family Medicine

## 2019-05-20 MED ORDER — CEPHALEXIN 500 MG PO CAPS: 500.0000 mg | ORAL_CAPSULE | Freq: Two times a day (BID) | ORAL | 0 refills | Status: DC

## 2019-05-21 ENCOUNTER — Other Ambulatory Visit: Payer: Self-pay | Admitting: Family Medicine

## 2019-05-24 ENCOUNTER — Encounter: Payer: Self-pay | Admitting: Family Medicine

## 2019-05-25 MED ORDER — ESCITALOPRAM OXALATE 20 MG PO TABS: 20.0000 mg | ORAL_TABLET | Freq: Every day | ORAL | 1 refills | Status: DC

## 2019-05-26 ENCOUNTER — Encounter: Payer: PRIVATE HEALTH INSURANCE | Admitting: Physical Therapy

## 2019-05-28 ENCOUNTER — Other Ambulatory Visit: Payer: Self-pay

## 2019-05-28 MED ORDER — ESCITALOPRAM OXALATE 20 MG PO TABS: 20.0000 mg | ORAL_TABLET | Freq: Every day | ORAL | 1 refills | Status: DC

## 2019-05-31 ENCOUNTER — Encounter: Payer: PRIVATE HEALTH INSURANCE | Admitting: Physical Therapy

## 2019-06-01 ENCOUNTER — Telehealth: Payer: Self-pay

## 2019-06-01 ENCOUNTER — Encounter: Payer: Self-pay | Admitting: Family Medicine

## 2019-06-01 ENCOUNTER — Encounter: Payer: PRIVATE HEALTH INSURANCE | Admitting: Family Medicine

## 2019-06-01 DIAGNOSIS — E785 Hyperlipidemia, unspecified: Secondary | ICD-10-CM

## 2019-06-01 DIAGNOSIS — E538 Deficiency of other specified B group vitamins: Secondary | ICD-10-CM

## 2019-06-01 DIAGNOSIS — R030 Elevated blood-pressure reading, without diagnosis of hypertension: Secondary | ICD-10-CM

## 2019-06-01 NOTE — Addendum Note (Signed)
Addended by: Rodrigo Ran on: 06/01/2019 04:03 PM   Modules accepted: Orders

## 2019-06-01 NOTE — Telephone Encounter (Signed)
Pt would like to have labs drawn before her physical scheduled for next week. Okay to order cbc, lipid, cmet, tsh, and vitamin B12?

## 2019-06-01 NOTE — Telephone Encounter (Signed)
Yes absolutely, thank you so much for your help.

## 2019-06-01 NOTE — Telephone Encounter (Signed)
Labs ordered. Pt aware to call for appt at office she prefers to have labs done at.

## 2019-06-04 ENCOUNTER — Other Ambulatory Visit (INDEPENDENT_AMBULATORY_CARE_PROVIDER_SITE_OTHER): Payer: PRIVATE HEALTH INSURANCE

## 2019-06-04 DIAGNOSIS — E538 Deficiency of other specified B group vitamins: Secondary | ICD-10-CM

## 2019-06-04 DIAGNOSIS — E785 Hyperlipidemia, unspecified: Secondary | ICD-10-CM | POA: Diagnosis not present

## 2019-06-04 DIAGNOSIS — R030 Elevated blood-pressure reading, without diagnosis of hypertension: Secondary | ICD-10-CM

## 2019-06-04 LAB — TSH: TSH: 1.62 u[IU]/mL (ref 0.35–4.50)

## 2019-06-04 LAB — VITAMIN B12: Vitamin B-12: 765 pg/mL (ref 211–911)

## 2019-06-04 LAB — COMPREHENSIVE METABOLIC PANEL
ALT: 17 U/L (ref 0–35)
AST: 16 U/L (ref 0–37)
Albumin: 4.2 g/dL (ref 3.5–5.2)
Alkaline Phosphatase: 69 U/L (ref 39–117)
BUN: 15 mg/dL (ref 6–23)
CO2: 29 mEq/L (ref 19–32)
Calcium: 9.5 mg/dL (ref 8.4–10.5)
Chloride: 102 mEq/L (ref 96–112)
Creatinine, Ser: 0.73 mg/dL (ref 0.40–1.20)
GFR: 80.58 mL/min (ref >60.00)
Glucose, Bld: 100 mg/dL — ABNORMAL HIGH (ref 70–99)
Potassium: 4.1 mEq/L (ref 3.5–5.1)
Sodium: 140 mEq/L (ref 135–145)
Total Bilirubin: 0.4 mg/dL (ref 0.2–1.2)
Total Protein: 6.9 g/dL (ref 6.0–8.3)

## 2019-06-04 LAB — LIPID PANEL
Cholesterol: 303 mg/dL — ABNORMAL HIGH (ref 0–200)
HDL: 51.1 mg/dL (ref >39.00)
NonHDL: 252.38
Total CHOL/HDL Ratio: 6
Triglycerides: 234 mg/dL — ABNORMAL HIGH (ref 0.0–149.0)
VLDL: 46.8 mg/dL — ABNORMAL HIGH (ref 0.0–40.0)

## 2019-06-04 LAB — LDL CHOLESTEROL, DIRECT: Direct LDL: 209 mg/dL

## 2019-06-04 LAB — CBC
HCT: 38.7 % (ref 36.0–46.0)
Hemoglobin: 12.9 g/dL (ref 12.0–15.0)
MCHC: 33.4 g/dL (ref 30.0–36.0)
MCV: 84.2 fl (ref 78.0–100.0)
Platelets: 263 10*3/uL (ref 150.0–400.0)
RBC: 4.59 Mil/uL (ref 3.87–5.11)
RDW: 14.7 % (ref 11.5–15.5)
WBC: 4.3 10*3/uL (ref 4.0–10.5)

## 2019-06-07 ENCOUNTER — Encounter: Payer: PRIVATE HEALTH INSURANCE | Admitting: Physical Therapy

## 2019-06-08 NOTE — Progress Notes (Signed)
Subjective:   Patient ID: [name], female    DOB: 1956-01-16, 63 y.o.   MRN: [id number]  [name] is a pleasant 63 y.o. year old female who presents to clinic today with Annual Exam  on 06/09/2019  HPI:  Health Maintenance  Topic Date Due  . Hepatitis C Screening  06/26/2019 (Originally February 16, 1956)  . MAMMOGRAM  07/21/2020  . Fecal DNA (Cologuard)  06/17/2021  . TETANUS/TDAP  09/01/2023  . INFLUENZA VACCINE  Discontinued  . HIV Screening  Discontinued   Remote h/o hysterectomy. Mammogram scheduled for 07/23/19 remote h/o left breast CA/ resection and tamoxifen) Colo guard neg 06/17/18. Denies any changes in her bowel habits or blood in her stool.  No family history of colonoscopy.  Dysuria- intermittent, has been to urologist.. No back pain, no hematuria.  She is having more dyspareunia, hot flashes.  Has tried every OTC lubricant.  She does not have a uterus.   She is on lexapro but that does not seem to be helping with hot flashes.  HLD- she has not been taking statin due to severe myalgias. Crestor and zocor previously caused myalgias.  She tried crestor 3 days per week and still had mylgias.  Coronary calcium done on 07/27/2018 and impression:  IMPRESSION: Coronary calcium score of 0. This was 0 percentile for age and sex matched control.  Lab Results  Component Value Date   CHOL 303 (H) 06/04/2019   HDL 51.10 06/04/2019   LDLCALC 141 (H) 10/26/2018   LDLDIRECT 209.0 06/04/2019   TRIG 234.0 (H) 06/04/2019   CHOLHDL 6 06/04/2019   Lab Results  Component Value Date   ALT 17 06/04/2019   AST 16 06/04/2019   ALKPHOS 69 06/04/2019   BILITOT 0.4 06/04/2019   The 10-year ASCVD risk score Mikey Bussing DC Jr., et al., 2013) is: 6.6%   Values used to calculate the score:     Age: 51 years     Sex: Female     Is Non-Hispanic African American: No     Diabetic: No     Tobacco smoker: No     Systolic Blood Pressure: XX123456 mmHg     Is BP treated: No     HDL  Cholesterol: 51.1 mg/dL     Total Cholesterol: 303 mg/dL  Depression- she does feel lexapro is controlling her symptoms at current dose. Feels it is not workingwell.  Depression screen [facility] 2/9 06/09/2019 12/15/2018 05/25/2018 05/20/2017 05/15/2015  Decreased Interest 0 0 0 0 0  Down, Depressed, Hopeless 0 0 0 0 0  PHQ - 2 Score 0 0 0 0 0       Current Outpatient Medications on File Prior to Visit  Medication Sig Dispense Refill  . [facility] ALLERGY RELIEF-D 5-120 MG tablet TAKE 1 TABLET BY MOUTH TWICE A DAY 60 tablet PRN  . Cyanocobalamin (B-12 PO) Take by mouth daily.    [name] escitalopram (LEXAPRO) 20 MG tablet Take 1 tablet (20 mg total) by mouth daily. 90 tablet 1  . fluticasone (FLONASE) 50 MCG/ACT nasal spray Place 2 sprays into both nostrils daily. 16 g 6  . ibuprofen (ADVIL) 800 MG tablet TAKE 1 TABLET BY MOUTH EVERY 8 HOURS AS NEEDED 90 tablet 3  . Multiple Vitamin (MULTIVITAMIN) tablet Take 1 tablet by mouth daily.    [name] omeprazole (PRILOSEC) 20 MG capsule Take 1 capsule (20 mg total) by mouth daily. (Patient taking differently: Take 20 mg by mouth daily as needed (acid reflux). )  90 capsule 2  . promethazine (PHENERGAN) 25 MG tablet Take 1 tablet (25 mg total) by mouth every 6 (six) hours as needed for nausea or vomiting. 30 tablet 1  . SUMAtriptan (IMITREX) 25 MG tablet Take 1 tablet (25 mg total) by mouth every 2 (two) hours as needed for migraine. May repeat in 2 hours if headache persists or recurs. 10 tablet 0  . traMADol (ULTRAM) 50 MG tablet TAKE 1 TABLET BY MOUTH EVERY 6 HOURS 30 tablet 5   No current facility-administered medications on file prior to visit.     Allergies  Allergen Reactions  . Statins Other (See Comments)    Muscle aches    Past Medical History:  Diagnosis Date  . Anemia   . Anxiety   . Blood transfusion without reported diagnosis   . Breast cancer ([place]) 02/27/2007   Right  . GERD (gastroesophageal reflux disease)   . Hypercholesterolemia   . Migraine    . Personal history of radiation therapy 2008   Right  . Wears dentures    partial upper    Past Surgical History:  Procedure Laterality Date  . ABDOMINAL HYSTERECTOMY    . BREAST BIOPSY Right 02/27/2007  . BREAST LUMPECTOMY Right 04/02/2007  . BREAST SURGERY  01/14/2013   breast reduction left side  . BROW LIFT Bilateral 06/17/2017   Procedure: BLEPHAROPLASTY UPPER EYELID WITH EXCESS SKIN;  Surgeon: Karle Starch, MD;  Location: [place];  Service: Ophthalmology;  Laterality: Bilateral;  . CESAREAN SECTION     two times  . COLONOSCOPY    . FOOT SURGERY Right   . PTOSIS REPAIR Bilateral 06/17/2017   Procedure: PTOSIS REPAIR RESECT EX;  Surgeon: Karle Starch, MD;  Location: [place];  Service: Ophthalmology;  Laterality: Bilateral;  . REDUCTION MAMMAPLASTY Left   . SHOULDER ARTHROSCOPY WITH ROTATOR CUFF REPAIR AND OPEN BICEPS TENODESIS Right 02/01/2019   Procedure: RIGHT SHOULDER ARTHROSCOPY, DEBRIDEMENT, BICEPS TENODESIS, MINI ROTATOR CUFF TEAR REPAIR;  Surgeon: Meredith Pel, MD;  Location: [place];  Service: Orthopedics;  Laterality: Right;    Family History  Problem Relation Age of Onset  . Thyroid disease Mother   . Hypertension Mother   . Hyperlipidemia Mother   . Alzheimer's disease Mother   . Heart failure Mother   . Esophageal cancer Father   . Hyperlipidemia Father   . Heart murmur Father   . Colon polyps Father   . Diabetes Sister   . Alzheimer's disease Maternal Aunt   . Alzheimer's disease Maternal Uncle   . Diabetes Paternal Uncle   . Alzheimer's disease Maternal Grandmother   . Diabetes Paternal Grandmother   . Breast cancer Sister   . Irritable bowel syndrome Sister   . Ulcerative colitis Sister        ????  . Breast cancer Cousin   . Breast cancer Cousin   . Colon cancer Neg Hx     Social History   Socioeconomic History  . Marital status: Married    Spouse name: Not on file  . Number of children: Not  on file  . Years of education: Not on file  . Highest education level: Not on file  Occupational History  . Not on file  Social Needs  . Financial resource strain: Not on file  . Food insecurity    Worry: Not on file    Inability: Not on file  . Transportation needs    Medical: Not on file  Non-medical: Not on file  Tobacco Use  . Smoking status: Never Smoker  . Smokeless tobacco: Never Used  Substance and Sexual Activity  . Alcohol use: No    Alcohol/week: 0.0 standard drinks  . Drug use: No  . Sexual activity: Yes    Partners: Male    Birth control/protection: Surgical  Lifestyle  . Physical activity    Days per week: Not on file    Minutes per session: Not on file  . Stress: Not on file  Relationships  . Social Herbalist on phone: Not on file    Gets together: Not on file    Attends religious service: Not on file    Active member of club or organization: Not on file    Attends meetings of clubs or organizations: Not on file    Relationship status: Not on file  . Intimate partner violence    Fear of current or ex partner: Not on file    Emotionally abused: Not on file    Physically abused: Not on file    Forced sexual activity: Not on file  Other Topics Concern  . Not on file  Social History Narrative  . Not on file   The PMH, PSH, Social History, Family History, Medications, and allergies have been reviewed in [facility], and have been updated if relevant.   Review of Systems  Constitutional: Negative.   HENT: Negative.   Eyes: Negative.   Respiratory: Negative.   Cardiovascular: Negative.   Gastrointestinal: Negative.   Endocrine: Positive for cold intolerance.  Genitourinary: Positive for dyspareunia and dysuria. Negative for decreased urine volume, enuresis, flank pain, frequency, genital sores, hematuria, menstrual problem, pelvic pain, urgency, vaginal bleeding and vaginal pain.  Musculoskeletal: Negative.   Allergic/Immunologic: Negative.    Neurological: Negative.   Hematological: Negative.   Psychiatric/Behavioral: Negative.   All other systems reviewed and are negative.      Objective:    BP 140/80   Pulse 75   Temp 98.5 F (36.9 C) (Oral)   Ht 5' 4.5" (1.638 m)   Wt 177 lb 3.2 oz (80.4 kg)   SpO2 95%   BMI 29.95 kg/m     General:  Well-developed,well-nourished,in no acute distress; alert,appropriate and cooperative throughout examination Head:  normocephalic and atraumatic.   Eyes:  vision grossly intact, PERRL Ears:  R ear normal and L ear normal externally, TMs clear bilaterally Nose:  no external deformity.   Mouth:  good dentition.   Neck:  No deformities, masses, or tenderness noted. Breasts:  No mass, nodules, thickening, tenderness, bulging, retraction, inflamation, nipple discharge or skin changes noted.   Lungs:  Normal respiratory effort, chest expands symmetrically. Lungs are clear to auscultation, no crackles or wheezes. Heart:  Normal rate and regular rhythm. S1 and S2 normal without gallop, murmur, click, rub or other extra sounds. Abdomen:  Bowel sounds positive,abdomen soft and non-tender without masses, organomegaly or hernias noted. Msk:  No deformity or scoliosis noted of thoracic or lumbar spine.   Extremities:  No clubbing, cyanosis, edema, or deformity noted with normal full range of motion of all joints.   Neurologic:  alert & oriented X3 and gait normal.   Skin:  Intact without suspicious lesions or rashes Cervical Nodes:  No lymphadenopathy noted Axillary Nodes:  No palpable lymphadenopathy Psych:  Cognition and judgment appear intact. Alert and cooperative with normal attention span and concentration. No apparent delusions, illusions, hallucinations        Assessment &  Plan:   Dysuria - Plan: POCT Urinalysis Dipstick  Well woman exam without gynecological exam  Vaginal atrophy  Hormone replacement therapy (HRT)  Hyperlipidemia, unspecified hyperlipidemia type No  follow-ups on file.

## 2019-06-09 ENCOUNTER — Ambulatory Visit (INDEPENDENT_AMBULATORY_CARE_PROVIDER_SITE_OTHER): Payer: PRIVATE HEALTH INSURANCE | Admitting: Family Medicine

## 2019-06-09 ENCOUNTER — Other Ambulatory Visit: Payer: Self-pay

## 2019-06-09 ENCOUNTER — Telehealth: Payer: Self-pay

## 2019-06-09 ENCOUNTER — Encounter: Payer: Self-pay | Admitting: Family Medicine

## 2019-06-09 VITALS — BP 140/80 | HR 75 | Temp 98.5°F | Ht 64.5 in | Wt 177.2 lb

## 2019-06-09 DIAGNOSIS — R300 Dysuria: Secondary | ICD-10-CM | POA: Diagnosis not present

## 2019-06-09 DIAGNOSIS — E785 Hyperlipidemia, unspecified: Secondary | ICD-10-CM

## 2019-06-09 DIAGNOSIS — N952 Postmenopausal atrophic vaginitis: Secondary | ICD-10-CM | POA: Diagnosis not present

## 2019-06-09 DIAGNOSIS — Z79890 Hormone replacement therapy (postmenopausal): Secondary | ICD-10-CM | POA: Diagnosis not present

## 2019-06-09 DIAGNOSIS — Z0000 Encounter for general adult medical examination without abnormal findings: Secondary | ICD-10-CM | POA: Diagnosis not present

## 2019-06-09 LAB — POCT URINALYSIS DIPSTICK
Bilirubin, UA: NEGATIVE
Blood, UA: NEGATIVE
Glucose, UA: NEGATIVE
Ketones, UA: POSITIVE
Nitrite, UA: NEGATIVE
Protein, UA: NEGATIVE
Spec Grav, UA: 1.01 (ref 1.010–1.025)
Urobilinogen, UA: NEGATIVE E.U./dL — AB
pH, UA: 7 (ref 5.0–8.0)

## 2019-06-09 MED ORDER — ESTROGENS CONJUGATED 0.3 MG PO TABS: 0.3000 mg | ORAL_TABLET | Freq: Every day | ORAL | 6 refills | Status: DC

## 2019-06-09 NOTE — Assessment & Plan Note (Addendum)
She truly cannot tolerate statins and given her ascvd risk score and her calcium score of zero, I am in agreement with her not taking  Statin at this time. The 10-year ASCVD risk score Mikey Bussing DC Brooke Bonito., et al., 2013) is: 6.6%   Values used to calculate the score:     Age: 63 years     Sex: Female     Is Non-Hispanic African American: No     Diabetic: No     Tobacco smoker: No     Systolic Blood Pressure: [id number] mmHg     Is BP treated: No     HDL Cholesterol: 51.1 mg/dL     Total Cholesterol: 303 mg/dL Coronary calcium done on 07/27/2018 and impression:  IMPRESSION: Coronary calcium score of 0. This was 0 percentile for age and sex matched control.

## 2019-06-09 NOTE — Assessment & Plan Note (Signed)
UA neg. ? Due to vaginal atrophy and not drinking enough water. Frequency may be due to bladder irritants... Drink water, avoid alcohol, caffeine, soda, citris, tomato, spicy foods.  Call or send my chart message prn if these symptoms worsen or fail to improve as anticipated.

## 2019-06-09 NOTE — Assessment & Plan Note (Signed)
Reviewed preventive care protocols, scheduled due services, and updated immunizations Discussed nutrition, exercise, diet, and healthy lifestyle.  

## 2019-06-09 NOTE — Assessment & Plan Note (Addendum)
Deteriorated also now with worsening hot flashes.  Discussed risks and benefits of HRT.  Pt does NOT have a uterus and therefore does not  need progesterone with estrogen to prevent endometrial hyperplasia (20-50% of patients on unopposed estrogen for 1 yr will develop hyperplasia). Start Premarin 0.3mg  daily for 21 days per month.  She will update me in a couple of weeks.

## 2019-06-09 NOTE — Telephone Encounter (Signed)
Questions for Screening COVID-19  Symptom onset: None  Travel or Contacts: None  During this illness, did/does the patient experience any of the following symptoms? Fever >100.106F []   Yes [x]   No []   Unknown Subjective fever (felt feverish) []   Yes [x]   No []   Unknown Chills []   Yes [x]   No []   Unknown Muscle aches (myalgia) []   Yes [x]   No []   Unknown Runny nose (rhinorrhea) []   Yes [x]   No []   Unknown Sore throat []   Yes [x]   No []   Unknown Cough (new onset or worsening of chronic cough) []   Yes [x]   No []   Unknown Shortness of breath (dyspnea) []   Yes [x]   No []   Unknown Nausea or vomiting []   Yes [x]   No []   Unknown Headache []   Yes [x]   No []   Unknown Abdominal pain  []   Yes [x]   No []   Unknown Diarrhea (?3 loose/looser than normal stools/24hr period) []   Yes [x]   No []   Unknown Other, specify:  Patient risk factors: Smoker? []   Current []   Former []   Never If female, currently pregnant? []   Yes []   No  Patient Active Problem List   Diagnosis Date Noted  . Elevated blood pressure reading without diagnosis of hypertension 01/13/2019  . Vaginal atrophy 12/15/2018  . Dyspnea on exertion 10/21/2018  . Family history of early CAD 07/08/2018  . Allergic rhinitis 06/25/2018  . Breast mass, right 05/25/2018  . Varicose veins of bilateral lower extremities with pain 06/09/2017  . Menopausal symptoms 02/16/2015  . Vitamin B 12 deficiency 04/11/2014  . Stress incontinence 08/31/2013  . Cervico-occipital neuralgia 06/01/2013  . Acute sinus infection 03/30/2013  . HLD (hyperlipidemia) 03/09/2012  . Migraine 12/31/2011  . Anxiety 12/31/2011  . Unspecified mood (affective) disorder ([place]) 12/31/2011  . Breast cancer ([place]) 12/31/2011    Plan:  []   High risk for COVID-19 with red flags go to ED (with CP, SOB, weak/lightheaded, or fever > 101.5). Call ahead.  []   High risk for COVID-19 but stable. Inform provider and coordinate time for [facility] visit.   []   No red flags but URI signs  or symptoms okay for [facility] visit.

## 2019-06-09 NOTE — Patient Instructions (Addendum)
Great to see you- . You are so thoughtful!!  Thank you for my card.  You always make my day but especially today.   Frequency may be due to bladder irritants... Drink a lot water, avoid alcohol, caffeine, soda, citris, tomato, spicy foods.  We are starting premarin 0.3 mg daily for 21 days and then 7 days off. Update me in 2 weeks, sooner if any concerns.

## 2019-06-10 ENCOUNTER — Encounter: Payer: Self-pay | Admitting: Family Medicine

## 2019-06-10 DIAGNOSIS — G44229 Chronic tension-type headache, not intractable: Secondary | ICD-10-CM

## 2019-06-11 MED ORDER — OMEPRAZOLE 20 MG PO CPDR: 20.0000 mg | DELAYED_RELEASE_CAPSULE | Freq: Every day | ORAL | 2 refills | Status: DC

## 2019-06-11 MED ORDER — PROMETHAZINE HCL 25 MG PO TABS: 25.0000 mg | ORAL_TABLET | Freq: Four times a day (QID) | ORAL | 1 refills | Status: DC | PRN

## 2019-06-11 MED ORDER — CETIRIZINE HCL 10 MG PO TABS: 10.0000 mg | ORAL_TABLET | Freq: Every day | ORAL | 2 refills | Status: DC

## 2019-06-14 ENCOUNTER — Ambulatory Visit: Payer: PRIVATE HEALTH INSURANCE | Admitting: Internal Medicine

## 2019-06-15 ENCOUNTER — Other Ambulatory Visit: Payer: Self-pay | Admitting: Family Medicine

## 2019-06-28 ENCOUNTER — Encounter: Payer: Self-pay | Admitting: Family Medicine

## 2019-07-23 ENCOUNTER — Other Ambulatory Visit: Payer: Self-pay

## 2019-07-23 ENCOUNTER — Ambulatory Visit
Admission: RE | Admit: 2019-07-23 | Discharge: 2019-07-23 | Disposition: A | Discharge status: Home / Self Care | Payer: PRIVATE HEALTH INSURANCE | Source: Ambulatory Visit | Attending: Family Medicine | Admitting: Family Medicine

## 2019-07-23 DIAGNOSIS — Z1239 Encounter for other screening for malignant neoplasm of breast: Secondary | ICD-10-CM

## 2019-08-04 ENCOUNTER — Encounter: Payer: Self-pay | Admitting: Family Medicine

## 2019-08-05 NOTE — Telephone Encounter (Signed)
I spoke with pt and she informed me that she is feeling much better since last night.  Pt explained that she was able to go to work today.  I informed pt that if the pain comes back or if she has any other concerns to call the office and schedule an appointment.

## 2019-09-27 ENCOUNTER — Other Ambulatory Visit: Payer: Self-pay

## 2019-09-27 MED ORDER — IBUPROFEN 800 MG PO TABS: 800.0000 mg | ORAL_TABLET | Freq: Three times a day (TID) | ORAL | 3 refills | Status: DC | PRN

## 2019-10-07 ENCOUNTER — Encounter: Payer: Self-pay | Admitting: Family Medicine

## 2019-10-19 ENCOUNTER — Telehealth: Payer: Self-pay

## 2019-10-19 NOTE — Telephone Encounter (Signed)
PA initiated via [facility] for Premarin #21 per 30d/thx dmf

## 2019-10-20 ENCOUNTER — Telehealth: Payer: Self-pay

## 2019-10-20 MED ORDER — MENEST 0.3 MG PO TABS: 0.3000 mg | ORAL_TABLET | Freq: Every day | ORAL | 6 refills | Status: DC

## 2019-10-20 NOTE — Telephone Encounter (Signed)
Yes okay to change to the menest.  I will send in rx now.

## 2019-10-20 NOTE — Telephone Encounter (Signed)
TA-Premarin 0.3mg  tabs denied per insurance/a choice from the following:  Estradiol tablets - (Estrace) 0.5mg  1mg , 2mg  Menest tablets - 0.3mg , 0.625mg , 1.25mg , 2.5mg  (the 0.3 in this is equivalent to                            current therapy) Estradiol Patches - Once weekly or biweekly  Plz advise if ok to change to Menest equivalent or one of the other two options/thx dmf

## 2019-11-15 ENCOUNTER — Telehealth: Payer: Self-pay | Admitting: Internal Medicine

## 2019-11-15 NOTE — Telephone Encounter (Signed)
Patient declined to follow up   Deleting recall.

## 2019-11-22 ENCOUNTER — Telehealth: Payer: Self-pay | Admitting: General Practice

## 2019-11-22 NOTE — Telephone Encounter (Signed)
Patient is calling and requesting a call back from [place]. CB is [phone]

## 2019-11-23 NOTE — Telephone Encounter (Signed)
Spoke to pt/thx dmf 

## 2019-11-29 ENCOUNTER — Encounter: Payer: Self-pay | Admitting: Family Medicine

## 2019-11-29 ENCOUNTER — Ambulatory Visit (INDEPENDENT_AMBULATORY_CARE_PROVIDER_SITE_OTHER): Payer: 59 | Admitting: Family Medicine

## 2019-11-29 ENCOUNTER — Other Ambulatory Visit: Payer: Self-pay

## 2019-11-29 VITALS — BP 132/86 | HR 81 | Temp 97.9°F | Ht 64.5 in | Wt 179.0 lb

## 2019-11-29 DIAGNOSIS — Z9889 Other specified postprocedural states: Secondary | ICD-10-CM | POA: Diagnosis not present

## 2019-11-29 DIAGNOSIS — M7521 Bicipital tendinitis, right shoulder: Secondary | ICD-10-CM | POA: Diagnosis not present

## 2019-11-29 DIAGNOSIS — M25511 Pain in right shoulder: Secondary | ICD-10-CM

## 2019-11-29 MED ORDER — CYCLOBENZAPRINE HCL 10 MG PO TABS: 5.0000 mg | ORAL_TABLET | Freq: Every evening | ORAL | 1 refills | Status: DC | PRN

## 2019-11-29 NOTE — Patient Instructions (Addendum)
Go back to doing your therapy exercises for 1 month, and if it still hurts really bad then I want you to see Dr. Marlou Sa.

## 2019-11-29 NOTE — Progress Notes (Signed)
Richardine Peppers T. Trevell Pariseau, MD Primary Care and [name] [place], [address] Phone: [phone]  FAX: [place] - 64 y.o. female  MRN [id number]  Date of Birth: [birth year]  Visit Date: 11/29/2019  PCP: Lucille Passy, MD  Referred by: Lucille Passy, MD  Chief Complaint  Patient presents with  . Shoulder Pain    Right    This visit occurred during the SARS-CoV-2 public health emergency.  Safety protocols were in place, including screening questions prior to the visit, additional usage of staff PPE, and extensive cleaning of exam room while observing appropriate contact time as indicated for disinfecting solutions.   Subjective:   [name] is a 64 y.o. very pleasant female patient with Body mass index is 30.25 kg/m. who presents with the following:  F/u R shoulder:  A little sore for a couple of months.   Off and on and will have some pain and swelling in the anterior shoulder.  No trauma at all and did not understand it.  This time it has been five days.  Has taken 500 mf ibuprofen.  Does clea some houses.    Had some surgery on R shoulder.   Full thickness RTC tear. 02/01/2019 s/p surgery.   R score with hemiopen RTC repain, SAD, DCE, biceps tendonitis.  Labral superior debridement  She has had a recurrence of some pain over the last few weeks.  She does clean houses for living, and done some relatively strenuous work.  Is having pain in the plane of abduction as well as flexion.  She does have pain in a T-shirt distribution, and she is having some pain anteriorly in the shoulder.  His neck is somewhat stiff, but she is not having any radicular symptoms.  No numbness or tingling.  05/20/2018 found Last OV with [name], MD  F/u R thumb and R shoulder: I saw her on April 29, 2018, and at that point I thought that she had a sesamoid fracture on her thumb.  I placed her in a thumb  spica splint, and she is now feeling quite a bit better from that.  She is still having some swelling in her MCP joint on the right, but globally this is doing a lot better.  R thumb is doing much better. Still with some swelling, but has been wearing her thumb spica splint.   Ongoing RTC tendonitis and subac bursitis. Did some PT and now with HEP.  She is still having a persistent ache in her shoulder and a pain with abduction.  She is having some pain in a T-shirt distribution, and having some pain at night.  Her motion is still preserved.  Review of Systems is noted in the HPI, as appropriate   Objective:   BP 132/86   Pulse 81   Temp 97.9 F (36.6 C) (Temporal)   Ht 5' 4.5" (1.638 m)   Wt 179 lb (81.2 kg)   SpO2 94%   BMI 30.25 kg/m   GEN: No acute distress; alert,appropriate. PULM: Breathing comfortably in no respiratory distress PSYCH: Normally interactive.   Right shoulder: Abduction is limited to 55 degrees as well as flexion.  5/5 strength. Internal and external range of motion appears grossly full, but there is some pain limitation.  Strength is 5/5.  She has pain at the [name] joint and pain notable in the bicipital groove. Speeds and Yergason's are  positive.  Michel Bickers test is positive as well as Neer test.  Drop test is negative.  Radiology: No results found.  Assessment and Plan:     ICD-10-CM   1. Status post right rotator cuff repair  Z98.890   2. Acute pain of right shoulder  M25.511   3. Biceps tendonitis on right  M75.21    Level of Medical Decision-Making in this case is MODERATE.   Think it is probably reasonable her to go back and do some of her home rehab program for next 3 or 4 weeks.  She still having quite a bit of difficulty then I would like for her to see her at the operating surgeon, Dr. Marlou Sa.  She has some difficulty sleeping.  She asked for some help with this.  I do not want to place her on stronger narcotics, so we will give her  some Flexeril to use at nighttime, and I think that this will help with her sleep.  Follow-up: No follow-ups on file.  Meds ordered this encounter  Medications  . cyclobenzaprine (FLEXERIL) 10 MG tablet    Sig: Take 0.5-1 tablets (5-10 mg total) by mouth at bedtime as needed for muscle spasms.    Dispense:  30 tablet    Refill:  1   Medications Discontinued During This Encounter  Medication Reason  . cetirizine (ZYRTEC) 10 MG tablet Completed Course  . [facility]S ALLERGY RELIEF-D 5-120 MG tablet Completed Course   No orders of the defined types were placed in this encounter.   Signed,  [name]. [name], MD   Outpatient Encounter Medications as of 11/29/2019  Medication Sig  . cetirizine-pseudoephedrine (ZYRTEC-D) 5-120 MG tablet Take 1 tablet by mouth daily.  . Cyanocobalamin (B-12 PO) Take by mouth daily.  [name] escitalopram (LEXAPRO) 20 MG tablet Take 1 tablet (20 mg total) by mouth daily.  . Esterified Estrogens (MENEST) 0.3 MG tablet Take 1 tablet (0.3 mg total) by mouth daily.  . fluticasone (FLONASE) 50 MCG/ACT nasal spray Place 2 sprays into both nostrils daily.  [name] ibuprofen (ADVIL) 800 MG tablet Take 1 tablet (800 mg total) by mouth every 8 (eight) hours as needed.  . Multiple Vitamin (MULTIVITAMIN) tablet Take 1 tablet by mouth daily.  [name] omeprazole (PRILOSEC) 20 MG capsule Take 1 capsule (20 mg total) by mouth daily.  . promethazine (PHENERGAN) 25 MG tablet Take 1 tablet (25 mg total) by mouth every 6 (six) hours as needed for nausea or vomiting.  . SUMAtriptan (IMITREX) 25 MG tablet Take 1 tablet (25 mg total) by mouth every 2 (two) hours as needed for migraine. May repeat in 2 hours if headache persists or recurs.  . traMADol (ULTRAM) 50 MG tablet TAKE 1 TABLET BY MOUTH EVERY 6 HOURS  . cyclobenzaprine (FLEXERIL) 10 MG tablet Take 0.5-1 tablets (5-10 mg total) by mouth at bedtime as needed for muscle spasms.  . [DISCONTINUED] cetirizine (ZYRTEC) 10 MG tablet Take 1 tablet (10  mg total) by mouth daily.  . [DISCONTINUED] CVS ALLERGY RELIEF-D 5-120 MG tablet TAKE 1 TABLET BY MOUTH TWICE A DAY   No facility-administered encounter medications on file as of 11/29/2019.

## 2019-12-22 ENCOUNTER — Ambulatory Visit: Payer: Self-pay

## 2019-12-22 ENCOUNTER — Other Ambulatory Visit: Payer: Self-pay

## 2019-12-22 ENCOUNTER — Ambulatory Visit (INDEPENDENT_AMBULATORY_CARE_PROVIDER_SITE_OTHER): Payer: 59 | Admitting: Orthopedic Surgery

## 2019-12-22 DIAGNOSIS — M7501 Adhesive capsulitis of right shoulder: Secondary | ICD-10-CM | POA: Diagnosis not present

## 2019-12-22 DIAGNOSIS — Z8739 Personal history of other diseases of the musculoskeletal system and connective tissue: Secondary | ICD-10-CM | POA: Diagnosis not present

## 2019-12-22 DIAGNOSIS — M25511 Pain in right shoulder: Secondary | ICD-10-CM | POA: Diagnosis not present

## 2019-12-24 ENCOUNTER — Encounter: Payer: Self-pay | Admitting: Orthopedic Surgery

## 2019-12-24 DIAGNOSIS — M7501 Adhesive capsulitis of right shoulder: Secondary | ICD-10-CM

## 2019-12-24 MED ORDER — METHYLPREDNISOLONE ACETATE 40 MG/ML IJ SUSP
40.0000 mg | INTRAMUSCULAR | Status: AC | PRN
  Administered 2019-12-24: 40 mg via INTRA_ARTICULAR

## 2019-12-24 MED ORDER — BUPIVACAINE HCL 0.5 % IJ SOLN
9.0000 mL | INTRAMUSCULAR | Status: AC | PRN
  Administered 2019-12-24: 9 mL via INTRA_ARTICULAR

## 2019-12-24 MED ORDER — LIDOCAINE HCL 1 % IJ SOLN
5.0000 mL | INTRAMUSCULAR | Status: AC | PRN
  Administered 2019-12-24: 5 mL

## 2019-12-24 NOTE — Progress Notes (Signed)
Office Visit Note   Patient: [name]           Date of Birth: Feb 17, 1956           MRN: [id number] Visit Date: 12/22/2019 Requested by: No referring provider defined for this encounter. PCP: Patient, No Pcp Per  Subjective: Chief Complaint  Patient presents with  . Right Shoulder - Pain    HPI: [name] is a 64 y.o. female who presents to the office complaining of right shoulder pain.  Patient notes that she has had 2 to 3 months of increased right shoulder pain.  Patient had gradual onset of lateral and anterior shoulder pain.  She denies any injury.  She denies any weakness.  Pain does wake her up at night and is associated with decreased range of motion.  She has been doing a home exercise program over the last 2 weeks that has helped with her range of motion.  She was initially unable to do her hair but she can now do it.  She still has difficulty with but not her bra.  Physical therapy has helped her function but not her pain.  She does have a history of right shoulder rotator cuff repair in May 2020.  Pain radiates down to her elbow currently.  She denies any neck pain or numbness/tingling.  She also notes some swelling supraclavicularly.              ROS:  All systems reviewed are negative as they relate to the chief complaint within the history of present illness.  Patient denies fevers or chills.  Assessment & Plan: Visit Diagnoses:  1. Right shoulder pain, unspecified chronicity   2. Adhesive capsulitis of right shoulder   3. History of rotator cuff tear     Plan: Patient is a 64 year old female who presents complaining of right shoulder pain.  She has a history of right shoulder rotator cuff tear that was repaired in May 2020.  She did well after surgery and made a full recovery according to her.  This is until February 2021 when she notes a gradual onset of lateral/anterior shoulder pain.  She denies any weakness but does note that pain is associated with  decreased range of motion and wakes her up at night.  Home exercise program has helped her function but not her pain.  On exam she has decreased range of motion of the right shoulder compared to the left.  She has 85 of abduction of the right shoulder compared with greater than 90 of the left.  20 of external rotation of the right shoulder compared with 35 of the left.  Additionally she is able to internally rotate her left shoulder to reach up to T10 on her spine whereas her right shoulder only reaches to L4.  Radiographs are negative for any identifiable pathology.  Impression is rotator cuff tear versus adhesive capsulitis of the right shoulder.  Administered right shoulder divided intra-articular and subacromial injection today in clinic.  Plan for patient to follow-up in 6 weeks for clinical recheck with decision to be made on right shoulder MRI at that point.    Follow-Up Instructions: No follow-ups on file.   Orders:  Orders Placed This Encounter  Procedures  . XR Shoulder Right   No orders of the defined types were placed in this encounter.     Procedures: Large Joint Inj: R subacromial bursa on 12/24/2019 4:18 PM Indications: diagnostic evaluation and pain Details: 18 G  1.5 in needle, posterior approach  Arthrogram: No  Medications: 9 mL bupivacaine 0.5 %; 40 mg methylPREDNISolone acetate 40 MG/ML; 5 mL lidocaine 1 % Outcome: tolerated well, no immediate complications Procedure, treatment alternatives, risks and benefits explained, specific risks discussed. Consent was given by the patient. Immediately prior to procedure a time out was called to verify the correct patient, procedure, equipment, support staff and site/side marked as required. Patient was prepped and draped in the usual sterile fashion.       Clinical Data: No additional findings.  Objective: Vital Signs: There were no vitals taken for this visit.  Physical Exam:  Constitutional: Patient appears  well-developed HEENT:  Head: Normocephalic Eyes:EOM are normal Neck: Normal range of motion Cardiovascular: Normal rate Pulmonary/chest: Effort normal Neurologic: Patient is alert Skin: Skin is warm Psychiatric: Patient has normal mood and affect  Ortho Exam:  Right shoulder Exam She has 85 of abduction of the right shoulder compared with greater than 90 of the left.  20 of external rotation of the right shoulder compared with 35 of the left.  Additionally she is able to internally rotate her left shoulder to reach up to T10 on her spine whereas her right shoulder only reaches to L4.  Forward flexion is equivalent bilaterally No TTP over the [facility] joint or bicipital groove Good subscapularis, supraspinatus, and infraspinatus strength Negative Hawkins impingement 5/5 grip strength, forearm pronation/supination, and bicep strength  Specialty Comments:  No specialty comments available.  Imaging: No results found.   PMFS History: Patient Active Problem List   Diagnosis Date Noted  . Well woman exam without gynecological exam 06/09/2019  . Hormone replacement therapy (HRT) 06/09/2019  . Dysuria 06/09/2019  . Elevated blood pressure reading without diagnosis of hypertension 01/13/2019  . Vaginal atrophy 12/15/2018  . Dyspnea on exertion 10/21/2018  . Family history of early CAD 07/08/2018  . Allergic rhinitis 06/25/2018  . Breast mass, right 05/25/2018  . Varicose veins of bilateral lower extremities with pain 06/09/2017  . Menopausal symptoms 02/16/2015  . Vitamin B 12 deficiency 04/11/2014  . Stress incontinence 08/31/2013  . Cervico-occipital neuralgia 06/01/2013  . Acute sinus infection 03/30/2013  . HLD (hyperlipidemia) 03/09/2012  . Migraine 12/31/2011  . Anxiety 12/31/2011  . Unspecified mood (affective) disorder ([place]) 12/31/2011  . Breast cancer ([place]) 12/31/2011   Past Medical History:  Diagnosis Date  . Anemia   . Anxiety   . Blood transfusion without  reported diagnosis   . Breast cancer ([place]) 02/27/2007   Right  . GERD (gastroesophageal reflux disease)   . Hypercholesterolemia   . Migraine   . Personal history of radiation therapy 2008   Right  . Wears dentures    partial upper    Family History  Problem Relation Age of Onset  . Thyroid disease Mother   . Hypertension Mother   . Hyperlipidemia Mother   . Alzheimer's disease Mother   . Heart failure Mother   . Esophageal cancer Father   . Hyperlipidemia Father   . Heart murmur Father   . Colon polyps Father   . Diabetes Sister   . Alzheimer's disease Maternal Aunt   . Alzheimer's disease Maternal Uncle   . Diabetes Paternal Uncle   . Alzheimer's disease Maternal Grandmother   . Diabetes Paternal Grandmother   . Breast cancer Sister   . Irritable bowel syndrome Sister   . Ulcerative colitis Sister        ????  . Breast cancer Cousin   .  Breast cancer Cousin   . Colon cancer Neg Hx     Past Surgical History:  Procedure Laterality Date  . ABDOMINAL HYSTERECTOMY    . BREAST BIOPSY Right 02/27/2007  . BREAST LUMPECTOMY Right 04/02/2007  . BREAST SURGERY  01/14/2013   breast reduction left side  . BROW LIFT Bilateral 06/17/2017   Procedure: BLEPHAROPLASTY UPPER EYELID WITH EXCESS SKIN;  Surgeon: Karle Starch, MD;  Location: [facility];  Service: Ophthalmology;  Laterality: Bilateral;  . CESAREAN SECTION     two times  . COLONOSCOPY    . FOOT SURGERY Right   . PTOSIS REPAIR Bilateral 06/17/2017   Procedure: PTOSIS REPAIR RESECT EX;  Surgeon: Karle Starch, MD;  Location: [place];  Service: Ophthalmology;  Laterality: Bilateral;  . REDUCTION MAMMAPLASTY Left   . SHOULDER ARTHROSCOPY WITH ROTATOR CUFF REPAIR AND OPEN BICEPS TENODESIS Right 02/01/2019   Procedure: RIGHT SHOULDER ARTHROSCOPY, DEBRIDEMENT, BICEPS TENODESIS, MINI ROTATOR CUFF TEAR REPAIR;  Surgeon: Meredith Pel, MD;  Location: [place];  Service: Orthopedics;   Laterality: Right;   Social History   Occupational History  . Not on file  Tobacco Use  . Smoking status: Never Smoker  . Smokeless tobacco: Never Used  Substance and Sexual Activity  . Alcohol use: No    Alcohol/week: 0.0 standard drinks  . Drug use: No  . Sexual activity: Yes    Partners: Male    Birth control/protection: Surgical

## 2019-12-30 ENCOUNTER — Telehealth (INDEPENDENT_AMBULATORY_CARE_PROVIDER_SITE_OTHER): Payer: 59 | Admitting: Family Medicine

## 2019-12-30 ENCOUNTER — Encounter: Payer: Self-pay | Admitting: Family Medicine

## 2019-12-30 VITALS — BP 143/83 | HR 80 | Temp 98.3°F

## 2019-12-30 DIAGNOSIS — J0100 Acute maxillary sinusitis, unspecified: Secondary | ICD-10-CM

## 2019-12-30 MED ORDER — AMOXICILLIN-POT CLAVULANATE 875-125 MG PO TABS: 1.0000 | ORAL_TABLET | Freq: Two times a day (BID) | ORAL | 0 refills | Status: AC

## 2019-12-30 NOTE — Progress Notes (Signed)
Virtual Visit via Telephone Note  I connected with [name] on 12/30/19 at 11:40 AM EDT by telephone and verified that I am speaking with the correct person using two identifiers.   I discussed the limitations, risks, security and privacy concerns of performing an evaluation and management service by telephone and the availability of in person appointments. I also discussed with the patient that there may be a patient responsible charge related to this service. The patient expressed understanding and agreed to proceed.  Patient location: Home Provider Location: [facility] Participants: Lesleigh Noe and [name]   History of Present Illness: Chief Complaint  Patient presents with  . Headache    sinus pressure, right ear pain, congestion, fatigue, some dizziness. sx started around 4/10. has taking Zyrtec D, Flonase    Sinusitis This is a new problem. The current episode started in the past 7 days. There has been no fever. Associated symptoms include congestion, ear pain, headaches and sinus pressure. Pertinent negatives include no chills, coughing, shortness of breath or sore throat.   Covid testing - awaiting results - at [facility] Has not gotten the covid vaccine  No loss of taste or smell  Using saline rinse as well  Review of Systems  Constitutional: Positive for malaise/fatigue. Negative for chills and fever.  HENT: Positive for congestion, ear pain and sinus pressure. Negative for sore throat.   Respiratory: Negative for cough and shortness of breath.   Gastrointestinal: Negative for diarrhea, nausea and vomiting.  Musculoskeletal: Negative for myalgias.  Neurological: Positive for dizziness and headaches.      Observations/Objective: BP (!) 143/83 Comment: per patient  Pulse 80 Comment: per patient  Temp 98.3 F (36.8 C) Comment: per patient  Phone visit:  Patient speaking in complete sentences No distress Alert and oriented Normal  mood  Assessment and Plan: Problem List Items Addressed This Visit      Respiratory   Acute sinus infection - Primary   Relevant Medications   amoxicillin-clavulanate (AUGMENTIN) 875-125 MG tablet     Discussed OTC treatment for viral illness  If covid test negative and symptoms not improved in 2 days, start Abx If covid positive - no need for abx, continue to monitor for worsening symptoms at home ER precautions given Advised staying out of work and isolating until results have come back    Follow Up Instructions:  Return if symptoms worsen or fail to improve.  Interactive audio and video telecommunications were attempted between this provider and patient, however failed, due to patient having technical difficulties OR patient did not have access to video capability.  We continued and completed visit with audio only.   Start Time: 11:45 End Time: 11:54    I discussed the assessment and treatment plan with the patient. The patient was provided an opportunity to ask questions and all were answered. The patient agreed with the plan and demonstrated an understanding of the instructions.   The patient was advised to call back or seek an in-person evaluation if the symptoms worsen or if the condition fails to improve as anticipated.  I provided 9 minutes of non-face-to-face time during this encounter.   Lesleigh Noe, MD

## 2020-01-06 ENCOUNTER — Encounter: Payer: 59 | Admitting: Family Medicine

## 2020-01-25 ENCOUNTER — Encounter: Payer: Self-pay | Admitting: Family Medicine

## 2020-01-25 ENCOUNTER — Other Ambulatory Visit: Payer: Self-pay

## 2020-01-25 ENCOUNTER — Ambulatory Visit (INDEPENDENT_AMBULATORY_CARE_PROVIDER_SITE_OTHER): Payer: 59 | Admitting: Family Medicine

## 2020-01-25 ENCOUNTER — Other Ambulatory Visit: Payer: Self-pay | Admitting: Family Medicine

## 2020-01-25 VITALS — BP 128/70 | HR 97 | Temp 98.6°F | Ht 64.5 in | Wt 173.0 lb

## 2020-01-25 DIAGNOSIS — Z79890 Hormone replacement therapy (postmenopausal): Secondary | ICD-10-CM

## 2020-01-25 DIAGNOSIS — E785 Hyperlipidemia, unspecified: Secondary | ICD-10-CM

## 2020-01-25 DIAGNOSIS — G43909 Migraine, unspecified, not intractable, without status migrainosus: Secondary | ICD-10-CM

## 2020-01-25 DIAGNOSIS — N952 Postmenopausal atrophic vaginitis: Secondary | ICD-10-CM

## 2020-01-25 DIAGNOSIS — F419 Anxiety disorder, unspecified: Secondary | ICD-10-CM

## 2020-01-25 MED ORDER — FLUOXETINE HCL 20 MG PO TABS: 40.0000 mg | ORAL_TABLET | Freq: Every day | ORAL | 1 refills | Status: DC

## 2020-01-25 MED ORDER — SUMATRIPTAN SUCCINATE 25 MG PO TABS: 25.0000 mg | ORAL_TABLET | ORAL | 0 refills | Status: DC | PRN

## 2020-01-25 MED ORDER — ESTROGENS CONJUGATED 0.3 MG PO TABS: 0.3000 mg | ORAL_TABLET | Freq: Every day | ORAL | 3 refills | Status: DC

## 2020-01-25 NOTE — Patient Instructions (Signed)
Stop Lexapro Start Fluoxetine 40 mg daily  After 2 weeks, if no improvement increase to 60 mg    How to help anxiety - without medication.   1) Regular Exercise - walking, jogging, cycling, dancing, strength training --> Yoga has been shown in research to reduce depression and anxiety -- with even just one hour long session per week  2)  Begin a Mindfulness/Meditation practice -- this can take a little as 3 minutes and is helpful for all kinds of mood issues -- You can find resources in books -- Or you can download apps like  ---- Headspace App (which currently has free content called "Weathering the Storm") ---- Calm (which has a few free options)  ---- Insignt Timer ---- Stop, Breathe & Think  # With each of these Apps - you should decline the "start free trial" offer and as you search through the App should be able to access some of their free content. You can also chose to pay for the content if you find one that works well for you.   # Many of them also offer sleep specific content which may help with insomnia  3) Healthy Diet -- Avoid or decrease Caffeine -- Avoid or decrease Alcohol -- Drink plenty of water, have a balanced diet -- Avoid cigarettes and marijuana (as well as other recreational drugs)  4) Consider contacting a professional therapist  -- [place] is one option. Call [phone] -- Or you can check out www.psychologytoday.com -- you can read bios of therapists and see if they accept insurance -- Check with your insurance to see if you have coverage and who may take your insurance

## 2020-01-25 NOTE — Assessment & Plan Note (Signed)
Significant home stress with care taking for her mother. She is looking into permanent solutions and will call for therapy. But we will switch to fluoxetine to see if this provides improved symptom control. GAD mildly elevated today.

## 2020-01-25 NOTE — Assessment & Plan Note (Signed)
Reviewed prior lipids which were elevated with ASCVD 6.1, however, coronary calcium score 0 in 2019. Will continue to follow and readdress as needed.

## 2020-01-25 NOTE — Assessment & Plan Note (Signed)
zomig worked better but had to switch due to insurance issues

## 2020-01-25 NOTE — Progress Notes (Signed)
Subjective:     [name] is a 64 y.o. female presenting for Establish Care (Patient is here to establish from Dr. Deborra Medina.), Stress (Pt mother is living with her and her husband. Pt states that she has a lot of stress), and Medication Refill (sumatriptan to pof. )     HPI  #Vaginal atrophy - tried the gels w/o improvement - and then oral estrogen replacement last year -   #HLD - saw the cardiologist - normal coronary artery score 0  #Stress/anxiety/depression - mother lives at home w/ dementia (gets mad/angry) - has been there for a year and seems to be getting worse - taking lexapro 20 mg for several years - no depression - having a hard time getting on a schedule - b/c of responsibilities caring for her mom - looking into memory care -- was in assisted living but lost benefits because her SS increased - crying more at home, feeling paralyzed - feels like lexapro has maybe not been working for a while  #migraines - less than 1 per month - responds ok to imitrex though did better with    Review of Systems   Social History   Tobacco Use  Smoking Status Never Smoker  Smokeless Tobacco Never Used        Objective:    BP Readings from Last 3 Encounters:  01/25/20 128/70  12/30/19 (!) 143/83  11/29/19 132/86   Wt Readings from Last 3 Encounters:  01/25/20 173 lb (78.5 kg)  11/29/19 179 lb (81.2 kg)  06/09/19 177 lb 3.2 oz (80.4 kg)    BP 128/70 (BP Location: Left Arm, Patient Position: Sitting, Cuff Size: Large)   Pulse 97   Temp 98.6 F (37 C) (Other (Comment))   Ht 5' 4.5" (1.638 m)   Wt 173 lb (78.5 kg)   SpO2 97%   BMI 29.24 kg/m    Physical Exam Constitutional:      General: She is not in acute distress.    Appearance: She is well-developed. She is not diaphoretic.  HENT:     Right Ear: External ear normal.     Left Ear: External ear normal.     Nose: Nose normal.  Eyes:     Conjunctiva/sclera: Conjunctivae normal.  Cardiovascular:      Rate and Rhythm: Normal rate.  Pulmonary:     Effort: Pulmonary effort is normal.  Musculoskeletal:     Cervical back: Neck supple.  Skin:    General: Skin is warm and dry.     Capillary Refill: Capillary refill takes less than 2 seconds.  Neurological:     Mental Status: She is alert. Mental status is at baseline.  Psychiatric:        Mood and Affect: Mood normal.        Behavior: Behavior normal.    Depression screen [facility] 2/9 01/25/2020 06/09/2019 12/15/2018  Decreased Interest 0 0 0  Down, Depressed, Hopeless 3 0 0  PHQ - 2 Score 3 0 0  Altered sleeping 0 - -  Tired, decreased energy 0 - -  Change in appetite 0 - -  Feeling bad or failure about yourself  0 - -  Trouble concentrating 0 - -  Moving slowly or fidgety/restless 0 - -  Suicidal thoughts 0 - -  PHQ-9 Score 3 - -  Difficult doing work/chores Not difficult at all - -  Some recent data might be hidden   GAD 7 : Generalized Anxiety Score 01/25/2020 12/15/2018  Nervous, Anxious, on Edge 0 0  Control/stop worrying 1 0  Worry too much - different things 1 0  Trouble relaxing 2 0  Restless 0 1  Easily annoyed or irritable 2 0  Afraid - awful might happen 0 0  Total GAD 7 Score 6 1  Anxiety Difficulty Somewhat difficult Not difficult at all      The 10-year ASCVD risk score Mikey Bussing DC Jr., et al., 2013) is: 6.1%   Values used to calculate the score:     Age: 76 years     Sex: Female     Is Non-Hispanic African American: No     Diabetic: No     Tobacco smoker: No     Systolic Blood Pressure: 0000000 mmHg     Is BP treated: No     HDL Cholesterol: 51.1 mg/dL     Total Cholesterol: 303 mg/dL       Assessment & Plan:   Problem List Items Addressed This Visit      Cardiovascular and Mediastinum   Migraine - Primary    zomig worked better but had to switch due to insurance issues      Relevant Medications   SUMAtriptan (IMITREX) 25 MG tablet   FLUoxetine (PROZAC) 20 MG tablet     Genitourinary   Vaginal  atrophy   Relevant Medications   estrogens, conjugated, (PREMARIN) 0.3 MG tablet     Other   Anxiety    Significant home stress with care taking for her mother. She is looking into permanent solutions and will call for therapy. But we will switch to fluoxetine to see if this provides improved symptom control. GAD mildly elevated today.       Relevant Medications   FLUoxetine (PROZAC) 20 MG tablet   HLD (hyperlipidemia)    Reviewed prior lipids which were elevated with ASCVD 6.1, however, coronary calcium score 0 in 2019. Will continue to follow and readdress as needed.       Hormone replacement therapy (HRT)    Is willing to try topical again, but has found the premarin was more effective than her current therapy (switched to to insurance). Discussed risk of heart disease with prolonged estrogen, she is aware. Only on year 1 of oral so will not stop now, but may consider readdressing in the future and she is open to trying topical again. However, significant symptom improvement with estrogen.       Relevant Medications   estrogens, conjugated, (PREMARIN) 0.3 MG tablet       Return in about 6 weeks (around 03/07/2020).  Lesleigh Noe, MD

## 2020-01-25 NOTE — Assessment & Plan Note (Signed)
Is willing to try topical again, but has found the premarin was more effective than her current therapy (switched to to insurance). Discussed risk of heart disease with prolonged estrogen, she is aware. Only on year 1 of oral so will not stop now, but may consider readdressing in the future and she is open to trying topical again. However, significant symptom improvement with estrogen.

## 2020-01-26 NOTE — Telephone Encounter (Signed)
Per pharmacy fax insurance will only cover capsules. RX updated and sent to the pharmacy

## 2020-02-02 ENCOUNTER — Ambulatory Visit: Payer: 59 | Admitting: Orthopedic Surgery

## 2020-02-09 ENCOUNTER — Ambulatory Visit (INDEPENDENT_AMBULATORY_CARE_PROVIDER_SITE_OTHER): Payer: 59 | Admitting: Orthopedic Surgery

## 2020-02-09 ENCOUNTER — Encounter: Payer: Self-pay | Admitting: Orthopedic Surgery

## 2020-02-09 ENCOUNTER — Other Ambulatory Visit: Payer: Self-pay

## 2020-02-09 DIAGNOSIS — M25511 Pain in right shoulder: Secondary | ICD-10-CM | POA: Diagnosis not present

## 2020-02-09 NOTE — Progress Notes (Signed)
Office Visit Note   Patient: [name]           Date of Birth: 1956-08-16           MRN: [id number] Visit Date: 02/09/2020 Requested by: Leah Noe, MD [place],  [place] [id number] PCP: Leah Noe, MD  Subjective: Chief Complaint  Patient presents with  . Right Shoulder - Pain    HPI: [name] is a patient is now 12 months out right shoulder surgery.  Injection given for 721 which did help.  Still doing exercises.  In general decision point today was for or against repeat MRI scanning but with her clinical improvement MRI scanning not indicated.  Taking ibuprofen only occasionally.              ROS: All systems reviewed are negative as they relate to the chief complaint within the history of present illness.  Patient denies  fevers or chills.   Assessment & Plan: Visit Diagnoses:  1. Right shoulder pain, unspecified chronicity     Plan: Impression is patient is doing well following right shoulder arthroscopy a year ago and subsequent injection in April.  Currently only limited about 10 degrees side to side difference in external rotation right versus left otherwise symmetric motion.  Continue with stretching primarily because I think she was trying to get early case of adhesive capsulitis.  Follow-up with [place] as needed.  Follow-Up Instructions: Return if symptoms worsen or fail to improve.   Orders:  No orders of the defined types were placed in this encounter.  No orders of the defined types were placed in this encounter.     Procedures: No procedures performed   Clinical Data: No additional findings.  Objective: Vital Signs: There were no vitals taken for this visit.  Physical Exam: On physical examination the patient has 170 of forward flexion on the right.  Isolated glenohumeral abduction is to 100.  Rotator cuff strength is good with no coarse grinding or crepitus with internal/external rotation of the shoulder at 90 degrees of abduction.   Incision intact.  Deltoid fires well.  Ortho Exam:   Constitutional: Patient appears well-developed HEENT:  Head: Normocephalic Eyes:EOM are normal Neck: Normal range of motion Cardiovascular: Normal rate Pulmonary/chest: Effort normal Neurologic: Patient is alert Skin: Skin is warm Psychiatric: Patient has normal mood and affect    Specialty Comments:  No specialty comments available.  Imaging: No results found.   PMFS History: Patient Active Problem List   Diagnosis Date Noted  . Hormone replacement therapy (HRT) 06/09/2019  . Elevated blood pressure reading without diagnosis of hypertension 01/13/2019  . Vaginal atrophy 12/15/2018  . Family history of early CAD 07/08/2018  . Allergic rhinitis 06/25/2018  . Breast mass, right 05/25/2018  . Varicose veins of bilateral lower extremities with pain 06/09/2017  . Menopausal symptoms 02/16/2015  . Vitamin B 12 deficiency 04/11/2014  . Stress incontinence 08/31/2013  . Cervico-occipital neuralgia 06/01/2013  . HLD (hyperlipidemia) 03/09/2012  . Migraine 12/31/2011  . Anxiety 12/31/2011  . Unspecified mood (affective) disorder ([place]) 12/31/2011  . History of breast cancer 12/31/2011   Past Medical History:  Diagnosis Date  . Anemia   . Anxiety   . Blood transfusion without reported diagnosis   . Breast cancer ([place]) 02/27/2007   Right  . GERD (gastroesophageal reflux disease)   . Hypercholesterolemia   . Migraine   . Personal history of radiation therapy 2008   Right  .  Wears dentures    partial upper    Family History  Problem Relation Age of Onset  . Thyroid disease Mother   . Hypertension Mother   . Hyperlipidemia Mother   . Alzheimer's disease Mother   . Heart failure Mother   . Esophageal cancer Father   . Hyperlipidemia Father   . Heart murmur Father   . Colon polyps Father   . COPD Father   . Diabetes Sister   . Alzheimer's disease Maternal Aunt   . Alzheimer's disease Maternal Uncle   .  Diabetes Paternal Uncle   . Alzheimer's disease Maternal Grandmother   . Diabetes Paternal Grandmother   . Breast cancer Sister   . Irritable bowel syndrome Sister   . Ulcerative colitis Sister   . Rheum arthritis Sister   . Breast cancer Cousin   . Breast cancer Cousin   . Colon cancer Neg Hx     Past Surgical History:  Procedure Laterality Date  . ABDOMINAL HYSTERECTOMY    . BREAST BIOPSY Right 02/27/2007  . BREAST LUMPECTOMY Right 04/02/2007  . BREAST SURGERY  01/14/2013   breast reduction left side  . BROW LIFT Bilateral 06/17/2017   Procedure: BLEPHAROPLASTY UPPER EYELID WITH EXCESS SKIN;  Surgeon: Karle Starch, MD;  Location: [facility];  Service: Ophthalmology;  Laterality: Bilateral;  . CESAREAN SECTION     two times  . COLONOSCOPY    . FOOT SURGERY Right   . PTOSIS REPAIR Bilateral 06/17/2017   Procedure: PTOSIS REPAIR RESECT EX;  Surgeon: Karle Starch, MD;  Location: [facility];  Service: Ophthalmology;  Laterality: Bilateral;  . REDUCTION MAMMAPLASTY Left   . SHOULDER ARTHROSCOPY WITH ROTATOR CUFF REPAIR AND OPEN BICEPS TENODESIS Right 02/01/2019   Procedure: RIGHT SHOULDER ARTHROSCOPY, DEBRIDEMENT, BICEPS TENODESIS, MINI ROTATOR CUFF TEAR REPAIR;  Surgeon: Meredith Pel, MD;  Location: [facility];  Service: Orthopedics;  Laterality: Right;   Social History   Occupational History  . Not on file  Tobacco Use  . Smoking status: Never Smoker  . Smokeless tobacco: Never Used  Substance and Sexual Activity  . Alcohol use: No    Alcohol/week: 0.0 standard drinks  . Drug use: No  . Sexual activity: Yes    Partners: Male    Birth control/protection: Surgical

## 2020-02-17 ENCOUNTER — Other Ambulatory Visit: Payer: Self-pay | Admitting: Family Medicine

## 2020-02-17 DIAGNOSIS — F419 Anxiety disorder, unspecified: Secondary | ICD-10-CM

## 2020-02-24 ENCOUNTER — Encounter: Payer: Self-pay | Admitting: Family Medicine

## 2020-02-24 ENCOUNTER — Other Ambulatory Visit: Payer: Self-pay | Admitting: Family Medicine

## 2020-02-24 MED ORDER — ESCITALOPRAM OXALATE 20 MG PO TABS: 20.0000 mg | ORAL_TABLET | Freq: Every day | ORAL | 3 refills | Status: DC

## 2020-03-07 ENCOUNTER — Ambulatory Visit: Payer: 59 | Admitting: Family Medicine

## 2020-03-30 ENCOUNTER — Other Ambulatory Visit: Payer: Self-pay

## 2020-03-30 DIAGNOSIS — G43909 Migraine, unspecified, not intractable, without status migrainosus: Secondary | ICD-10-CM

## 2020-03-30 MED ORDER — IBUPROFEN 800 MG PO TABS: 800.0000 mg | ORAL_TABLET | Freq: Three times a day (TID) | ORAL | 0 refills | Status: DC | PRN

## 2020-03-30 NOTE — Telephone Encounter (Signed)
Last OV: 01/25/20 for migraine Last refill: 09/27/19 #90 3 refills  No future appts

## 2020-05-30 NOTE — Telephone Encounter (Signed)
Spoke with [name].  She states she doesn't use the Ibuprofen that often.   At most she may use 3 to 4 tablets a month.  She states she currently does not need a refill.  FYI to Dr. Einar Pheasant.

## 2020-06-28 ENCOUNTER — Encounter: Payer: Self-pay | Admitting: Family Medicine

## 2020-07-03 ENCOUNTER — Other Ambulatory Visit: Payer: Self-pay | Admitting: Family Medicine

## 2020-07-25 ENCOUNTER — Other Ambulatory Visit: Payer: Self-pay | Admitting: Family Medicine

## 2020-07-25 NOTE — Telephone Encounter (Signed)
Pharmacy requests refill on: Sumatriptan 25 mg  LAST REFILL: 02/24/2020 LAST OV: 01/25/2020 NEXT OV: 09/11/2020 PHARMACY: [id number] [place], [place]

## 2020-08-18 ENCOUNTER — Other Ambulatory Visit: Payer: Self-pay | Admitting: Family Medicine

## 2020-08-18 DIAGNOSIS — Z0000 Encounter for general adult medical examination without abnormal findings: Secondary | ICD-10-CM

## 2020-08-21 ENCOUNTER — Other Ambulatory Visit: Payer: Self-pay

## 2020-08-22 ENCOUNTER — Other Ambulatory Visit: Payer: Self-pay | Admitting: Family Medicine

## 2020-08-22 MED ORDER — MENEST 0.3 MG PO TABS: 1.0000 | ORAL_TABLET | Freq: Every day | ORAL | 0 refills | Status: DC

## 2020-08-30 DIAGNOSIS — U071 COVID-19: Secondary | ICD-10-CM

## 2020-08-30 HISTORY — DX: COVID-19: U07.1

## 2020-09-04 ENCOUNTER — Encounter: Payer: Self-pay | Admitting: Family Medicine

## 2020-09-05 ENCOUNTER — Telehealth (INDEPENDENT_AMBULATORY_CARE_PROVIDER_SITE_OTHER): Payer: 59 | Admitting: Primary Care

## 2020-09-05 ENCOUNTER — Other Ambulatory Visit: Payer: Self-pay

## 2020-09-05 ENCOUNTER — Other Ambulatory Visit (INDEPENDENT_AMBULATORY_CARE_PROVIDER_SITE_OTHER): Payer: 59

## 2020-09-05 ENCOUNTER — Encounter: Payer: Self-pay | Admitting: Primary Care

## 2020-09-05 VITALS — Temp 100.6°F | Ht 64.5 in | Wt 173.0 lb

## 2020-09-05 DIAGNOSIS — R051 Acute cough: Secondary | ICD-10-CM | POA: Insufficient documentation

## 2020-09-05 DIAGNOSIS — Z1152 Encounter for screening for COVID-19: Secondary | ICD-10-CM | POA: Diagnosis not present

## 2020-09-05 DIAGNOSIS — R059 Cough, unspecified: Secondary | ICD-10-CM | POA: Diagnosis not present

## 2020-09-05 NOTE — Progress Notes (Signed)
Subjective:    Patient ID: [name], female    DOB: Feb 07, 1956, 64 y.o.   MRN: [id number]  HPI     [name] - 63 y.o. female  MRN [id number]  Date of Birth: [birth year]  PCP: Leah Noe, MD  This service was provided via telemedicine. Phone Visit performed on 09/05/2020    Rationale for phone visit along with limitations reviewed. I discussed the limitations, risks, security and privacy concerns of performing a phone visit and the availability of in person appointments. I also discussed with the patient that there may be a patient responsible charge related to this service. Patient consented to telephone encounter.    Location of patient: Home Location of provider: Office [place] [facility] Participants: Patient and myself Name of referring provider: N/A   Names of persons and role in encounter: Provider: Pleas Koch, NP  Patient: [name]  Other: N/A   Time on call: 9 mim - 36 sec   Subjective: Chief Complaint  Patient presents with  . Sinus Problem     X 7 days otc meds not helping. Cough first few days now having sinus pain with fever of 102 for last two days. Started after funeral. Test app given to patient for this afternoon.      HPI:  [name] is a 64 year old female patient of Dr. Einar Cohen with a history of migraines, allergic rhinitis, neuralgia who presents today with a chief complaint of sinus pain.   Symptoms began about 7 days ago with a cough, then progressed to headache, head congestion, fevers, diarrhea. Fevers began 2-3 days ago with 102.3, then 101.4. Today her temperature is 100.6. Prior to symptoms she was at a funeral for her husbands family, indoors mostly, people attended the funeral from out of state, hardly anyone was wearing a mask.    She's taken Tylenol, Mucinex, Zyrtec-D without resolve. She is not vaccinated against Covid-19. She's not drinking much water or liquids in general.   She denies loss of  taste/smell, shortness of breath. Her husband has the same symptoms.   Objective/Observations:  No physical exam or vital signs collected unless specifically identified below.   Temp (!) 100.6 F (38.1 C) (Oral)   Ht 5' 4.5" (1.638 m)   Wt 173 lb (78.5 kg)   BMI 29.24 kg/m    Respiratory status: speaks in complete sentences without evident shortness of breath.   Assessment/Plan:  One week of respiratory symptoms, high risk for Covid-19 exposure just prior to symptoms beginning. She is not vaccinated against Covid-19. She is in no respiratory distress, no cough during visit.  She will obtain a rapid Covid-19 test today. We have also scheduled her for PCR send off, will cancel if her rapid is positive. Discussed to continue to quarantine. Continue with conservative treatment.   Await results.   No problem-specific Assessment & Plan notes found for this encounter.   I discussed the assessment and treatment plan with the patient. The patient was provided an opportunity to ask questions and all were answered. The patient agreed with the plan and demonstrated an understanding of the instructions.  Lab Orders  No laboratory test(s) ordered today    No orders of the defined types were placed in this encounter.   The patient was advised to call back or seek an in-person evaluation if the symptoms worsen or if the condition fails to improve as anticipated.  Pleas Koch, NP  Review of Systems  Constitutional: Positive for chills and fever.  HENT: Positive for congestion and sinus pressure.   Respiratory: Positive for cough. Negative for shortness of breath.   Gastrointestinal: Positive for diarrhea.       Past Medical History:  Diagnosis Date  . Anemia   . Anxiety   . Blood transfusion without reported diagnosis   . Breast cancer ([place]) 02/27/2007   Right  . GERD (gastroesophageal reflux disease)   . Hypercholesterolemia   . Migraine   . Personal history of  radiation therapy 2008   Right  . Wears dentures    partial upper     Social History   Socioeconomic History  . Marital status: Married    Spouse name: [name]  . Number of children: 2  . Years of education: high school  . Highest education level: Not on file  Occupational History  . Not on file  Tobacco Use  . Smoking status: Never Smoker  . Smokeless tobacco: Never Used  Vaping Use  . Vaping Use: Never used  Substance and Sexual Activity  . Alcohol use: No    Alcohol/week: 0.0 standard drinks  . Drug use: No  . Sexual activity: Yes    Partners: Male    Birth control/protection: Surgical  Other Topics Concern  . 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      Family: 2 daughters - [name] and [name] - 3 grandchildren      Enjoys: boating, walking, beach, fishing, outdoor activity      Exercise: keeps busy moving around the house, irregular   Diet: chicken, some read meat, veggies, salads - trying to limit sugar      Safety   Seat belts: Yes    Guns: Yes  and secure   Safe in relationships: Yes    Social Determinants of Radio broadcast assistant Strain: Not on file  Food Insecurity: Not on file  Transportation Needs: Not on file  Physical Activity: Not on file  Stress: Not on file  Social Connections: Not on file  Intimate Partner Violence: Not on file    Past Surgical History:  Procedure Laterality Date  . ABDOMINAL HYSTERECTOMY    . BREAST BIOPSY Right 02/27/2007  . BREAST LUMPECTOMY Right 04/02/2007  . BREAST SURGERY  01/14/2013   breast reduction left side  . Cohen LIFT Bilateral 06/17/2017   Procedure: BLEPHAROPLASTY UPPER EYELID WITH EXCESS SKIN;  Surgeon: Karle Starch, MD;  Location: [facility];  Service: Ophthalmology;  Laterality: Bilateral;  . CESAREAN SECTION     two times  .  COLONOSCOPY    . FOOT SURGERY Right   . PTOSIS REPAIR Bilateral 06/17/2017   Procedure: PTOSIS REPAIR RESECT EX;  Surgeon: Karle Starch, MD;  Location: [facility];  Service: Ophthalmology;  Laterality: Bilateral;  . REDUCTION MAMMAPLASTY Left   . SHOULDER ARTHROSCOPY WITH ROTATOR CUFF REPAIR AND OPEN BICEPS TENODESIS Right 02/01/2019   Procedure: RIGHT SHOULDER ARTHROSCOPY, DEBRIDEMENT, BICEPS TENODESIS, MINI ROTATOR CUFF TEAR REPAIR;  Surgeon: Meredith Pel, MD;  Location: [facility];  Service: Orthopedics;  Laterality: Right;    Family History  Problem Relation Age of Onset  . Thyroid disease Mother   . Hypertension Mother   . Hyperlipidemia Mother   .  Alzheimer's disease Mother   . Heart failure Mother   . Esophageal cancer Father   . Hyperlipidemia Father   . Heart murmur Father   . Colon polyps Father   . COPD Father   . Diabetes Sister   . Alzheimer's disease Maternal Aunt   . Alzheimer's disease Maternal Uncle   . Diabetes Paternal Uncle   . Alzheimer's disease Maternal Grandmother   . Diabetes Paternal Grandmother   . Breast cancer Sister   . Irritable bowel syndrome Sister   . Ulcerative colitis Sister   . Rheum arthritis Sister   . Breast cancer Cousin   . Breast cancer Cousin   . Colon cancer Neg Hx     Allergies  Allergen Reactions  . Statins Other (See Comments)    Muscle aches    Current Outpatient Medications on File Prior to Visit  Medication Sig Dispense Refill  . cetirizine-pseudoephedrine (ZYRTEC-D) 5-120 MG tablet Take 1 tablet by mouth daily.    . Cyanocobalamin (B-12 PO) Take by mouth daily.    . cyclobenzaprine (FLEXERIL) 10 MG tablet Take 0.5-1 tablets (5-10 mg total) by mouth at bedtime as needed for muscle spasms. 30 tablet 1  . escitalopram (LEXAPRO) 20 MG tablet Take 1 tablet (20 mg total) by mouth daily. 90 tablet 3  . estrogens, conjugated, (PREMARIN) 0.3 MG tablet Take 1 tablet (0.3 mg total) by mouth daily.  Take daily for 21 days then do not take for 7 days. 90 tablet 3  . fluticasone (FLONASE) 50 MCG/ACT nasal spray Place 2 sprays into both nostrils daily. 16 g 6  . ibuprofen (ADVIL) 800 MG tablet Take 1 tablet (800 mg total) by mouth every 8 (eight) hours as needed. 90 tablet 0  . MENEST 0.3 MG tablet Take 1 tablet (0.3 mg total) by mouth daily. 30 tablet 0  . Multiple Vitamin (MULTIVITAMIN) tablet Take 1 tablet by mouth daily.    . SUMAtriptan (IMITREX) 25 MG tablet TAKE 1 TABLET AS NEEDED FOR MIGRAINE. MAY REPEAT IN 2 HOURS IF HEADACHE PERSISTS OR RECURS. 9 tablet 0  . traMADol (ULTRAM) 50 MG tablet TAKE 1 TABLET BY MOUTH EVERY 6 HOURS 30 tablet 0  . omeprazole (PRILOSEC) 20 MG capsule Take 1 capsule (20 mg total) by mouth daily. (Patient not taking: Reported on 09/05/2020) 90 capsule 2   No current facility-administered medications on file prior to visit.    Temp (!) 100.6 F (38.1 C) (Oral)   Ht 5' 4.5" (1.638 m)   Wt 173 lb (78.5 kg)   BMI 29.24 kg/m    Objective:   Physical Exam Constitutional:      General: She is not in acute distress. Pulmonary:     Effort: Pulmonary effort is normal.     Comments: No cough during visit Neurological:     Mental Status: She is alert.            Assessment & Plan:

## 2020-09-05 NOTE — Patient Instructions (Signed)
Please update [place] once you complete your home rapid Covid-19 test.  Continue taking Tylenol, increase intake of water and liquids.  I will be in touch later with our next course of action.  It was a pleasure meeting you!

## 2020-09-05 NOTE — Assessment & Plan Note (Signed)
One week of respiratory symptoms, high risk for Covid-19 exposure just prior to symptoms beginning. She is not vaccinated against Covid-19. She is in no respiratory distress, no cough during visit.  She will obtain a rapid Covid-19 test today. We have also scheduled her for PCR send off, will cancel if her rapid is positive. Discussed to continue to quarantine. Continue with conservative treatment.   Await results.

## 2020-09-05 NOTE — Telephone Encounter (Signed)
Seen in virtual today

## 2020-09-07 LAB — NOVEL CORONAVIRUS, NAA: SARS-CoV-2, NAA: DETECTED — AB

## 2020-09-07 LAB — SARS-COV-2, NAA 2 DAY TAT

## 2020-09-08 DIAGNOSIS — U071 COVID-19: Secondary | ICD-10-CM | POA: Insufficient documentation

## 2020-09-11 ENCOUNTER — Encounter: Payer: 59 | Admitting: Family Medicine

## 2020-09-11 NOTE — Telephone Encounter (Signed)
We don't retest. Covid test can stay positive for weeks to months. As long as they are asymptomatic after at 10 days, they can go back to work.

## 2020-09-27 ENCOUNTER — Encounter: Payer: Self-pay | Admitting: Family Medicine

## 2020-09-27 ENCOUNTER — Other Ambulatory Visit: Payer: Self-pay

## 2020-09-27 ENCOUNTER — Ambulatory Visit (INDEPENDENT_AMBULATORY_CARE_PROVIDER_SITE_OTHER): Payer: 59 | Admitting: Family Medicine

## 2020-09-27 VITALS — BP 130/82 | HR 82 | Temp 97.8°F | Ht 64.75 in | Wt 160.5 lb

## 2020-09-27 DIAGNOSIS — N952 Postmenopausal atrophic vaginitis: Secondary | ICD-10-CM

## 2020-09-27 DIAGNOSIS — K2960 Other gastritis without bleeding: Secondary | ICD-10-CM

## 2020-09-27 DIAGNOSIS — Z23 Encounter for immunization: Secondary | ICD-10-CM | POA: Diagnosis not present

## 2020-09-27 DIAGNOSIS — Z0001 Encounter for general adult medical examination with abnormal findings: Secondary | ICD-10-CM | POA: Diagnosis not present

## 2020-09-27 DIAGNOSIS — J0100 Acute maxillary sinusitis, unspecified: Secondary | ICD-10-CM

## 2020-09-27 DIAGNOSIS — G43909 Migraine, unspecified, not intractable, without status migrainosus: Secondary | ICD-10-CM

## 2020-09-27 DIAGNOSIS — R7303 Prediabetes: Secondary | ICD-10-CM

## 2020-09-27 DIAGNOSIS — E785 Hyperlipidemia, unspecified: Secondary | ICD-10-CM

## 2020-09-27 LAB — COMPREHENSIVE METABOLIC PANEL
ALT: 11 U/L (ref 0–35)
AST: 13 U/L (ref 0–37)
Albumin: 4.1 g/dL (ref 3.5–5.2)
Alkaline Phosphatase: 62 U/L (ref 39–117)
BUN: 18 mg/dL (ref 6–23)
CO2: 32 mEq/L (ref 19–32)
Calcium: 9 mg/dL (ref 8.4–10.5)
Chloride: 103 mEq/L (ref 96–112)
Creatinine, Ser: 0.66 mg/dL (ref 0.40–1.20)
GFR: 92.93 mL/min (ref >60.00)
Glucose, Bld: 95 mg/dL (ref 70–99)
Potassium: 4.1 mEq/L (ref 3.5–5.1)
Sodium: 140 mEq/L (ref 135–145)
Total Bilirubin: 0.4 mg/dL (ref 0.2–1.2)
Total Protein: 7 g/dL (ref 6.0–8.3)

## 2020-09-27 LAB — CBC
HCT: 36.9 % (ref 36.0–46.0)
Hemoglobin: 12.3 g/dL (ref 12.0–15.0)
MCHC: 33.2 g/dL (ref 30.0–36.0)
MCV: 84.9 fl (ref 78.0–100.0)
Platelets: 266 10*3/uL (ref 150.0–400.0)
RBC: 4.35 Mil/uL (ref 3.87–5.11)
RDW: 14 % (ref 11.5–15.5)
WBC: 5.2 10*3/uL (ref 4.0–10.5)

## 2020-09-27 LAB — LIPID PANEL
Cholesterol: 271 mg/dL — ABNORMAL HIGH (ref 0–200)
HDL: 56 mg/dL (ref >39.00)
LDL Cholesterol: 176 mg/dL — ABNORMAL HIGH (ref 0–99)
NonHDL: 215.26
Total CHOL/HDL Ratio: 5
Triglycerides: 195 mg/dL — ABNORMAL HIGH (ref 0.0–149.0)
VLDL: 39 mg/dL (ref 0.0–40.0)

## 2020-09-27 LAB — TSH: TSH: 1.34 u[IU]/mL (ref 0.35–4.50)

## 2020-09-27 LAB — HEMOGLOBIN A1C: Hgb A1c MFr Bld: 6.4 % (ref 4.6–6.5)

## 2020-09-27 MED ORDER — AMOXICILLIN-POT CLAVULANATE 875-125 MG PO TABS: 1.0000 | ORAL_TABLET | Freq: Two times a day (BID) | ORAL | 0 refills | Status: AC

## 2020-09-27 MED ORDER — SUMATRIPTAN SUCCINATE 25 MG PO TABS: ORAL_TABLET | ORAL | 0 refills | Status: DC

## 2020-09-27 MED ORDER — TRAMADOL HCL 50 MG PO TABS
50.0000 mg | ORAL_TABLET | Freq: Four times a day (QID) | ORAL | 0 refills | Status: DC
Start: 2020-09-27 — End: 2021-11-29

## 2020-09-27 MED ORDER — OMEPRAZOLE 20 MG PO CPDR: 20.0000 mg | DELAYED_RELEASE_CAPSULE | Freq: Every day | ORAL | 2 refills | Status: DC

## 2020-09-27 NOTE — Patient Instructions (Addendum)
Check with insurance for Shingles shot  Estrogen is safe to take for about 5 years We should think about switching to topical estrogen within the next fews    Based on your symptoms, it looks like you have a virus.   Antibiotics are not need for a viral infection but the following will help:   1. Drink plenty of fluids 2. Get lots of rest  Sinus Congestion 1) Neti Pot (Saline rinse) -- 2 times day -- if tolerated 2) Flonase (Store Brand ok) - once daily 3) Over the counter congestion medications  Cough 1) Cough drops can be helpful 2) Nyquil (or nighttime cough medication) 3) Honey is proven to be one of the best cough medications  4) Cough medicine with Dextromethorphan can also be helpful  Sore Throat 1) Honey as above, cough drops 2) Ibuprofen or Aleve can be helpful 3) Salt water Gargles  If you develop fevers (Temperature >100.4), chills, worsening symptoms or symptoms lasting longer than 10 days return to clinic.

## 2020-09-27 NOTE — Progress Notes (Signed)
Annual Exam   Chief Complaint:  Chief Complaint  Patient presents with  . Annual Exam    Having trouble with fatigue since covid in December     History of Present Illness:  Ms. [name] is a 65 y.o. [id number] who LMP was No LMP recorded. Patient has had a hysterectomy., presents today for her annual examination.    Continues to have HA and pressures since covid Not sure if sinus  Has some sinus pressure No dental pain Some ear pain Cough has returned  Nutrition She does get adequate calcium and Vitamin D in her diet. Diet: recovering from covid, diet is off, tries to eat 2 meals per day Exercise: not currently due to fatigue post covid  Safety The patient wears seatbelts: yes.     The patient feels safe at home and in their relationships: yes.   Menstrual:  Over, still some symptoms  GYN She is single partner, contraception - post menopausal status.    Cervical Cancer Screening (21-65):   Last Pap:  2015 Results were: no abnormalities /neg HPV DNA --- s/p hysterectomy  Breast Cancer Screening (Age 71-74):  There is no FH of breast cancer. There is no FH of ovarian cancer. BRCA screening No.  Last Mammogram: 07/2019 The patient does want a mammogram this year.    Colon Cancer Screening:  Age 58-75 yo - benefits outweigh the risk. Adults 29-85 yo who have never been screened benefit.  Benefits: 134000 people in 2016 will be diagnosed and 49,000 will die - early detection helps [place]: Complications 2/2 to colonoscopy High Risk (Colonoscopy): genetic disorder (Lynch syndrome or familial adenomatous polyposis), personal hx of IBD, previous adenomatous polyp, or previous colorectal cancer, FamHx start 10 years before the age at diagnosis, increased in males and black race  Options:  FIT - looks for hemoglobin (blood in the stool) - specific and fairly sensitive - must be done annually Cologuard - looks for DNA and blood - more sensitive - therefore can have more  false positives, every 3 years Colonoscopy - every 10 years if normal - sedation, bowl prep, must have someone drive you  Shared decision making and the patient had decided to do cologuard, last done 2019.   Social History   Tobacco Use  Smoking Status Never Smoker  Smokeless Tobacco Never Used    Lung Cancer Screening (Ages 56-70): not applicable   Weight Wt Readings from Last 3 Encounters:  09/27/20 160 lb 8 oz (72.8 kg)  09/05/20 173 lb (78.5 kg)  01/25/20 173 lb (78.5 kg)   Patient has normal BMI  BMI Readings from Last 1 Encounters:  09/27/20 26.92 kg/m     Chronic disease screening Blood pressure monitoring:  BP Readings from Last 3 Encounters:  09/27/20 130/82  01/25/20 128/70  12/30/19 (!) 143/83    Lipid Monitoring: Indication for screening: age >52, obesity, diabetes, family hx, CV risk factors.  Lipid screening: Yes  Lab Results  Component Value Date   CHOL 303 (H) 06/04/2019   HDL 51.10 06/04/2019   LDLCALC 141 (H) 10/26/2018   LDLDIRECT 209.0 06/04/2019   TRIG 234.0 (H) 06/04/2019   CHOLHDL 6 06/04/2019     Diabetes Screening: age >14, overweight, family hx, PCOS, hx of gestational diabetes, at risk ethnicity Diabetes Screening screening: Yes  Lab Results  Component Value Date   HGBA1C 6.4 03/08/2014     Past Medical History:  Diagnosis Date  . Anemia   . Anxiety   .  Blood transfusion without reported diagnosis   . Breast cancer ([place]) 02/27/2007   Right  . COVID-19 08/30/2020  . GERD (gastroesophageal reflux disease)   . Hypercholesterolemia   . Migraine   . Personal history of radiation therapy 2008   Right  . Wears dentures    partial upper    Past Surgical History:  Procedure Laterality Date  . ABDOMINAL HYSTERECTOMY    . BREAST BIOPSY Right 02/27/2007  . BREAST LUMPECTOMY Right 04/02/2007  . BREAST SURGERY  01/14/2013   breast reduction left side  . BROW LIFT Bilateral 06/17/2017   Procedure: BLEPHAROPLASTY UPPER  EYELID WITH EXCESS SKIN;  Surgeon: Karle Starch, MD;  Location: [facility];  Service: Ophthalmology;  Laterality: Bilateral;  . CESAREAN SECTION     two times  . COLONOSCOPY    . FOOT SURGERY Right   . PTOSIS REPAIR Bilateral 06/17/2017   Procedure: PTOSIS REPAIR RESECT EX;  Surgeon: Karle Starch, MD;  Location: [facility];  Service: Ophthalmology;  Laterality: Bilateral;  . REDUCTION MAMMAPLASTY Left   . SHOULDER ARTHROSCOPY WITH ROTATOR CUFF REPAIR AND OPEN BICEPS TENODESIS Right 02/01/2019   Procedure: RIGHT SHOULDER ARTHROSCOPY, DEBRIDEMENT, BICEPS TENODESIS, MINI ROTATOR CUFF TEAR REPAIR;  Surgeon: Meredith Pel, MD;  Location: [facility];  Service: Orthopedics;  Laterality: Right;    Prior to Admission medications   Medication Sig Start Date End Date Taking? Authorizing Provider  cetirizine-pseudoephedrine (ZYRTEC-D) 5-120 MG tablet Take 1 tablet by mouth daily.   Yes [provider]  Cyanocobalamin (B-12 PO) Take by mouth daily.   Yes [provider]  escitalopram (LEXAPRO) 20 MG tablet Take 1 tablet (20 mg total) by mouth daily. 02/24/20  Yes Lesleigh Noe, MD  fluticasone (FLONASE) 50 MCG/ACT nasal spray Place 2 sprays into both nostrils daily. 06/25/18  Yes Lucille Passy, MD  ibuprofen (ADVIL) 800 MG tablet Take 1 tablet (800 mg total) by mouth every 8 (eight) hours as needed. 03/30/20  Yes Lesleigh Noe, MD  MENEST 0.3 MG tablet Take 1 tablet (0.3 mg total) by mouth daily. 08/22/20  Yes Lesleigh Noe, MD  Multiple Vitamin (MULTIVITAMIN) tablet Take 1 tablet by mouth daily.   Yes [provider]  omeprazole (PRILOSEC) 20 MG capsule Take 1 capsule (20 mg total) by mouth daily. 06/11/19  Yes Lucille Passy, MD  SUMAtriptan (IMITREX) 25 MG tablet TAKE 1 TABLET AS NEEDED FOR MIGRAINE. MAY REPEAT IN 2 HOURS IF HEADACHE PERSISTS OR RECURS. 08/22/20  Yes Lesleigh Noe, MD  traMADol (ULTRAM) 50 MG tablet TAKE 1 TABLET BY  MOUTH EVERY 6 HOURS 06/16/19  Yes Lucille Passy, MD    Allergies  Allergen Reactions  . Statins Other (See Comments)    Muscle aches    Gynecologic History: No LMP recorded. Patient has had a hysterectomy.  Obstetric History: [id number]  Social History   Socioeconomic History  . Marital status: Married    Spouse name: [name]  . Number of children: 2  . Years of education: high school  . Highest education level: Not on file  Occupational History  . Not on file  Tobacco Use  . Smoking status: Never Smoker  . Smokeless tobacco: Never Used  Vaping Use  . Vaping Use: Never used  Substance and Sexual Activity  . Alcohol use: No    Alcohol/week: 0.0 standard drinks  . Drug use: No  . Sexual activity: Yes    Partners: Male  Birth control/protection: Surgical  Other Topics Concern  . 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      Family: 2 daughters - [name] and [name] - 3 grandchildren      Enjoys: boating, walking, beach, fishing, outdoor activity      Exercise: keeps busy moving around the house, irregular   Diet: chicken, some read meat, veggies, salads - trying to limit sugar      Safety   Seat belts: Yes    Guns: Yes  and secure   Safe in relationships: Yes    Social Determinants of Radio broadcast assistant Strain: Not on file  Food Insecurity: Not on file  Transportation Needs: Not on file  Physical Activity: Not on file  Stress: Not on file  Social Connections: Not on file  Intimate Partner Violence: Not on file    Family History  Problem Relation Age of Onset  . Thyroid disease Mother   . Hypertension Mother   . Hyperlipidemia Mother   . Alzheimer's disease Mother   . Heart failure Mother   . Esophageal cancer Father   . Hyperlipidemia Father   . Heart murmur Father   . Colon polyps Father    . COPD Father   . Diabetes Sister   . Alzheimer's disease Maternal Aunt   . Alzheimer's disease Maternal Uncle   . Diabetes Paternal Uncle   . Alzheimer's disease Maternal Grandmother   . Diabetes Paternal Grandmother   . Breast cancer Sister   . Irritable bowel syndrome Sister   . Ulcerative colitis Sister   . Rheum arthritis Sister   . Breast cancer Cousin   . Breast cancer Cousin   . Colon cancer Neg Hx     Review of Systems  Constitutional: Positive for malaise/fatigue. Negative for chills and fever.  HENT: Positive for congestion. Negative for sore throat.   Eyes: Negative for blurred vision and double vision.  Respiratory: Positive for cough. Negative for shortness of breath.   Cardiovascular: Negative for chest pain.  Gastrointestinal: Negative for heartburn, nausea and vomiting.  Genitourinary: Negative.   Musculoskeletal: Negative.  Negative for myalgias.  Skin: Negative for rash.  Neurological: Negative for dizziness and headaches.  Endo/Heme/Allergies: Does not bruise/bleed easily.  Psychiatric/Behavioral: Negative for depression. The patient is not nervous/anxious.      Physical Exam BP 130/82   Pulse 82   Temp 97.8 F (36.6 C) (Temporal)   Ht 5' 4.75" (1.645 m)   Wt 160 lb 8 oz (72.8 kg)   SpO2 96%   BMI 26.92 kg/m    BP Readings from Last 3 Encounters:  09/27/20 130/82  01/25/20 128/70  12/30/19 (!) 143/83      Physical Exam Constitutional:      General: She is not in acute distress.    Appearance: She is well-developed and well-nourished. She is not diaphoretic.  HENT:     Head: Normocephalic and atraumatic.     Right Ear: Tympanic membrane and external ear normal.     Left Ear: Tympanic membrane and external ear normal.     Nose: Congestion present.     Right Sinus: Maxillary sinus tenderness present.     Left Sinus: Maxillary sinus tenderness present.     Mouth/Throat:     Mouth: Mucous membranes are  moist.     Pharynx: Posterior  oropharyngeal erythema present.  Eyes:     General: No scleral icterus.    Extraocular Movements: EOM normal.     Conjunctiva/sclera: Conjunctivae normal.  Cardiovascular:     Rate and Rhythm: Normal rate and regular rhythm.     Heart sounds: No murmur heard.   Pulmonary:     Effort: Pulmonary effort is normal. No respiratory distress.     Breath sounds: Normal breath sounds. No wheezing.  Abdominal:     General: Bowel sounds are normal. There is no distension.     Palpations: Abdomen is soft. There is no mass.     Tenderness: There is no abdominal tenderness. There is no guarding or rebound.  Musculoskeletal:        General: No edema. Normal range of motion.     Cervical back: Neck supple.  Lymphadenopathy:     Cervical: No cervical adenopathy.  Skin:    General: Skin is warm and dry.     Capillary Refill: Capillary refill takes less than 2 seconds.  Neurological:     Mental Status: She is alert and oriented to person, place, and time.     Deep Tendon Reflexes: Strength normal. Reflexes normal.  Psychiatric:        Mood and Affect: Mood and affect normal.        Behavior: Behavior normal.     Results:  PHQ-9:  [place] Office Visit from 01/25/2020 in [place] at [place]  PHQ-9 Total Score 3        Assessment: 65 y.o. [id number] female here for routine annual physical examination.  Plan: Problem List Items Addressed This Visit      Cardiovascular and Mediastinum   Migraine   Relevant Medications   SUMAtriptan (IMITREX) 25 MG tablet   traMADol (ULTRAM) 50 MG tablet     Genitourinary   Vaginal atrophy     Other   HLD (hyperlipidemia)   Relevant Orders   Lipid panel    Other Visit Diagnoses    Encounter for routine adult physical exam with abnormal findings    -  Primary   Relevant Orders   Comprehensive metabolic panel   CBC   TSH   Hemoglobin A1c   Lipid panel   Prediabetes       Relevant Orders   Hemoglobin A1c   Acute  non-recurrent maxillary sinusitis       Relevant Medications   amoxicillin-clavulanate (AUGMENTIN) 875-125 MG tablet   Reflux gastritis       Relevant Medications   omeprazole (PRILOSEC) 20 MG capsule   Need for influenza vaccination       Relevant Orders   Flu Vaccine QUAD 36+ mos IM (Completed)      Screening: -- Blood pressure screen normal -- cholesterol screening: will obtain -- Weight screening: normal -- Diabetes Screening: will obtain -- Nutrition: Encouraged healthy diet  The 10-year ASCVD risk score Mikey Bussing DC Jr., et al., 2013) is: 6.8%   Values used to calculate the score:     Age: 60 years     Sex: Female     Is Non-Hispanic African American: No     Diabetic: No     Tobacco smoker: No     Systolic Blood Pressure: 992 mmHg     Is BP treated: No     HDL Cholesterol: 51.1 mg/dL     Total Cholesterol: 303 mg/dL  -- Statin therapy for Age 19-75 with  CVD risk >7.5%  Psych -- Depression screening (PHQ-9):  Flowsheet Row Office Visit from 01/25/2020 in [place] at [facility]  PHQ-9 Total Score 3       Safety -- tobacco screening: not using -- alcohol screening:  low-risk usage. -- no evidence of domestic violence or intimate partner violence.   Cancer Screening -- pap smear not collected per ASCCP guidelines -- family history of breast cancer screening: done. not at high risk. -- Mammogram - scheduled -- Colon cancer (age 67+)-- will plan cologuard  Immunizations Immunization History  Administered Date(s) Administered  . Influenza,inj,Quad PF,6+ Mos 05/11/2014, 05/15/2015, 05/16/2016, 09/27/2020  . Tdap 08/31/2013    -- flu vaccine up to date -- TDAP q10 years up to date -- Shingles (age >9) check with insurance -- Covid-19 Vaccine is planning after 90 days   Encouraged healthy diet and exercise. Encouraged regular vision and dental care.    Lesleigh Noe, MD

## 2020-09-29 ENCOUNTER — Ambulatory Visit: Payer: 59

## 2020-11-21 ENCOUNTER — Inpatient Hospital Stay: Admission: RE | Admit: 2020-11-21 | Payer: 59 | Source: Ambulatory Visit

## 2020-11-21 ENCOUNTER — Other Ambulatory Visit: Payer: Self-pay

## 2020-11-21 ENCOUNTER — Ambulatory Visit
Admission: RE | Admit: 2020-11-21 | Discharge: 2020-11-21 | Disposition: A | Discharge status: Home / Self Care | Payer: 59 | Source: Ambulatory Visit | Attending: Family Medicine | Admitting: Family Medicine

## 2020-11-21 DIAGNOSIS — Z0000 Encounter for general adult medical examination without abnormal findings: Secondary | ICD-10-CM

## 2020-11-27 ENCOUNTER — Other Ambulatory Visit: Payer: Self-pay | Admitting: Family Medicine

## 2020-11-27 DIAGNOSIS — R928 Other abnormal and inconclusive findings on diagnostic imaging of breast: Secondary | ICD-10-CM

## 2020-12-12 ENCOUNTER — Ambulatory Visit
Admission: RE | Admit: 2020-12-12 | Discharge: 2020-12-12 | Disposition: A | Discharge status: Home / Self Care | Payer: 59 | Source: Ambulatory Visit | Attending: Family Medicine | Admitting: Family Medicine

## 2020-12-12 ENCOUNTER — Other Ambulatory Visit: Payer: Self-pay

## 2020-12-12 ENCOUNTER — Other Ambulatory Visit: Payer: Self-pay | Admitting: Family Medicine

## 2020-12-12 DIAGNOSIS — R928 Other abnormal and inconclusive findings on diagnostic imaging of breast: Secondary | ICD-10-CM

## 2021-01-01 ENCOUNTER — Encounter: Payer: Self-pay | Admitting: Family Medicine

## 2021-01-03 ENCOUNTER — Ambulatory Visit (INDEPENDENT_AMBULATORY_CARE_PROVIDER_SITE_OTHER): Payer: 59 | Admitting: Family Medicine

## 2021-01-03 ENCOUNTER — Other Ambulatory Visit: Payer: Self-pay

## 2021-01-03 ENCOUNTER — Encounter: Payer: Self-pay | Admitting: Family Medicine

## 2021-01-03 ENCOUNTER — Other Ambulatory Visit: Payer: Self-pay | Admitting: Family Medicine

## 2021-01-03 VITALS — BP 138/82 | HR 86 | Temp 97.9°F | Wt 174.5 lb

## 2021-01-03 DIAGNOSIS — R300 Dysuria: Secondary | ICD-10-CM | POA: Diagnosis not present

## 2021-01-03 DIAGNOSIS — N952 Postmenopausal atrophic vaginitis: Secondary | ICD-10-CM | POA: Diagnosis not present

## 2021-01-03 LAB — POC URINALSYSI DIPSTICK (AUTOMATED)
Bilirubin, UA: NEGATIVE
Blood, UA: POSITIVE
Glucose, UA: NEGATIVE
Ketones, UA: NEGATIVE
Leukocytes, UA: NEGATIVE
Nitrite, UA: NEGATIVE
Protein, UA: NEGATIVE
Spec Grav, UA: 1.01 (ref 1.010–1.025)
Urobilinogen, UA: 0.2 E.U./dL
pH, UA: 6.5 (ref 5.0–8.0)

## 2021-01-03 MED ORDER — ESTROGENS, CONJUGATED 0.625 MG/GM VA CREA
TOPICAL_CREAM | VAGINAL | 12 refills | Status: DC
Start: 2021-01-03 — End: 2021-01-04

## 2021-01-03 NOTE — Patient Instructions (Signed)
Your urine did not show an infection  I will send the urine for culture to rule out infection  I recommend good hydration and continue cranberry supplements.   If the culture is positive I will send antibiotics if you do not get better in 2-3 days and culture negative we can try antibiotics.

## 2021-01-03 NOTE — Telephone Encounter (Signed)
Pharmacy is asking for alternative medication.  Premarin not covered by insurance.

## 2021-01-03 NOTE — Progress Notes (Signed)
Subjective:     KATERIN NEGRETE is a 65 y.o. female presenting for Dysuria, Urinary Frequency, and Dyspareunia ("burning" )     Dysuria  This is a new problem. The current episode started 1 to 4 weeks ago. The problem occurs intermittently. The problem has been waxing and waning. The quality of the pain is described as burning. Associated symptoms include flank pain, frequency and urgency. Pertinent negatives include no hematuria, hesitancy, nausea or vomiting. She has tried antibiotics (cranberry supplement) for the symptoms. The treatment provided moderate relief.  Urinary Frequency  Associated symptoms include flank pain, frequency and urgency. Pertinent negatives include no hematuria, hesitancy, nausea or vomiting.     Took ciprofloxacin which was left over and this helped her symptoms Symptoms returned after having intercourse Took replenish   Will get burning after intercourse and will take the cranberry supplement with improvement   Drinks a lot of water  Vaginal atrophy  - lubricants not enough - was better on estrogen but stopped this last month - interested in topical estrogen  Review of Systems  Gastrointestinal: Negative for nausea and vomiting.  Genitourinary: Positive for dysuria, flank pain, frequency and urgency. Negative for hematuria and hesitancy.     Social History   Tobacco Use  Smoking Status Never Smoker  Smokeless Tobacco Never Used        Objective:    BP Readings from Last 3 Encounters:  01/03/21 138/82  09/27/20 130/82  01/25/20 128/70   Wt Readings from Last 3 Encounters:  01/03/21 174 lb 8 oz (79.2 kg)  09/27/20 160 lb 8 oz (72.8 kg)  09/05/20 173 lb (78.5 kg)    BP 138/82   Pulse 86   Temp 97.9 F (36.6 C) (Temporal)   Wt 174 lb 8 oz (79.2 kg)   SpO2 96%   BMI 29.26 kg/m    Physical Exam Constitutional:      General: She is not in acute distress.    Appearance: She is well-developed. She is not diaphoretic.  HENT:      Head: Normocephalic and atraumatic.  Eyes:     Conjunctiva/sclera: Conjunctivae normal.  Cardiovascular:     Rate and Rhythm: Normal rate.  Pulmonary:     Effort: Pulmonary effort is normal.  Abdominal:     General: There is no distension.     Palpations: Abdomen is soft.     Tenderness: There is no abdominal tenderness. There is no guarding.  Musculoskeletal:     Cervical back: Neck supple.  Skin:    General: Skin is warm and dry.  Neurological:     Mental Status: She is alert.     UA: positive blood, neg LE, Neg nitrites       Assessment & Plan:   Problem List Items Addressed This Visit      Genitourinary   Vaginal atrophy    Topical lubricant ineffective and getting post-coital urinary symptoms. She is no longer taking systemic estrogen. Will try topical estrogen to see if symptoms improve.       Relevant Medications   conjugated estrogens (PREMARIN) vaginal cream    Other Visit Diagnoses    Dysuria    -  Primary   Relevant Orders   Urine Culture   POCT Urinalysis Dipstick (Automated) (Completed)     Given symptoms still suspect possible UTI which has been partially treated with home antibiotics. Will send for culture. Discussed that I would consider treatment if positive or possibly if symptoms  do not resolve given previous response to abx.   Return if symptoms worsen or fail to improve.  Lesleigh Noe, MD  This visit occurred during the SARS-CoV-2 public health emergency.  Safety protocols were in place, including screening questions prior to the visit, additional usage of staff PPE, and extensive cleaning of exam room while observing appropriate contact time as indicated for disinfecting solutions.

## 2021-01-03 NOTE — Assessment & Plan Note (Signed)
Topical lubricant ineffective and getting post-coital urinary symptoms. She is no longer taking systemic estrogen. Will try topical estrogen to see if symptoms improve.

## 2021-01-04 LAB — URINE CULTURE
MICRO NUMBER:: 11792600
Result:: NO GROWTH
SPECIMEN QUALITY:: ADEQUATE

## 2021-01-04 MED ORDER — ESTRADIOL 0.1 MG/GM VA CREA: 1.0000 | TOPICAL_CREAM | Freq: Every day | VAGINAL | 12 refills | Status: DC

## 2021-01-04 NOTE — Telephone Encounter (Signed)
Alternative sent to pharmacy.

## 2021-01-14 ENCOUNTER — Other Ambulatory Visit: Payer: Self-pay | Admitting: Family Medicine

## 2021-01-14 DIAGNOSIS — G43909 Migraine, unspecified, not intractable, without status migrainosus: Secondary | ICD-10-CM

## 2021-01-15 MED ORDER — ESTRADIOL 0.1 MG/GM VA CREA: 1.0000 | TOPICAL_CREAM | VAGINAL | 12 refills | Status: DC

## 2021-02-10 ENCOUNTER — Encounter: Payer: Self-pay | Admitting: Family Medicine

## 2021-02-12 ENCOUNTER — Other Ambulatory Visit: Payer: Self-pay | Admitting: Family Medicine

## 2021-02-12 DIAGNOSIS — G43909 Migraine, unspecified, not intractable, without status migrainosus: Secondary | ICD-10-CM

## 2021-02-15 ENCOUNTER — Ambulatory Visit: Payer: 59 | Admitting: Family Medicine

## 2021-03-25 NOTE — Progress Notes (Signed)
[name] T. Allan Bacigalupi, MD, [place] at [facility] [place], [address]  Phone: [phone]  FAX: [facility] - 65 y.o. female  MRN [id number]  Date of Birth: 04-24-1956  Date: 03/26/2021  PCP: Lesleigh Noe, MD  Referral: Lesleigh Noe, MD  Chief Complaint  Patient presents with   Leg Pain    Right-Door Facing  fell on back of leg 3 weeks ago    This visit occurred during the SARS-CoV-2 public health emergency.  Safety protocols were in place, including screening questions prior to the visit, additional usage of staff PPE, and extensive cleaning of exam room while observing appropriate contact time as indicated for disinfecting solutions.   Subjective:   [name] is a 65 y.o. very pleasant female patient with Body mass index is 29.14 kg/m. who presents with the following:  She is here for evaluation of R sided LE pain after a board fell on it about 3 weeks ago.  Frame fell and hit her medial R LE.  At that time, she developed some extensive swelling and bruising in the posterior aspect of her right lower extremity that was focal and just distal to the gastrocnemius.  Throughout that time she did not have focal bone pain throughout the foot and ankle, tibia or fibula.  She had no swelling at the knee and continued to be able to move the knee without difficulty and full range of motion.  This is now about 3 weeks out from this initial injury, she still has some pain in this region.  She also has some tingling sensation and decreased sensation about the area as well.  Additionally, greater than 1 week she has had pain in the maxillary sinus region.  She denies any other focal ill sensations, including cough, earache, sore throat, myalgia, polyarthralgia, runny nose.  Review of Systems is noted in the HPI, as appropriate   Objective:   BP 140/82   Pulse 84   Temp 98.2 F (36.8 C)  (Temporal)   Ht 5' 4.75" (1.645 m)   Wt 173 lb 12 oz (78.8 kg)   SpO2 97%   BMI 29.14 kg/m   GEN: No acute distress; alert,appropriate.   HEENT: Normocephalic and atraumatic. Throat clear, w/o exudate, no LAD, R TM clear, L TM - good landmarks, No fluid present. rhinnorhea.  Left maxillary sinuses: Tender Right maxillary sinuses: Tender  Neck: No ant or post LAD CV: RRR, No M/G/R Pulm: Breathing comfortably in no resp distress. no w/c/r Psych: full affect, pleasant   Right posterior lower extremity with some swelling and minor degree of color change distal to the calf and proximal to the Achilles tendon.  There is some textural change in this region as well.  There is no tenderness in the gastrocnemius or the popliteal region.  There also is some mild swelling in the lower extremity as well as in the foot and ankle. Plantar flexion and dorsiflexion are entirely intact. Homans test is negative.  There is some mild decrease sensation about this area distally.  Radiology: No results found.  Assessment and Plan:     ICD-10-CM   1. Hematoma of right lower leg  S80.11XA     2. Acute leg pain, right  M79.604     3. Acute non-recurrent maxillary sinusitis  J01.00      Resolving hematoma of the posterior lower extremity.  Anticipate 6 to 8 weeks  for resolution.  Tingling and decreased sensation would be from swelling after trauma.  This should resolve as well.  Recommended some light compression when up and moving about.  Elevation and ice after standing or work.  She also has clinically consistent maxillary sinusitis as well.  I am going to give her some Augmentin and have her do some basic supportive care as well.  Social: Pain is limiting her exercise somewhat right now.  Meds ordered this encounter  Medications   amoxicillin-clavulanate (AUGMENTIN) 875-125 MG tablet    Sig: Take 1 tablet by mouth 2 (two) times daily.    Dispense:  20 tablet    Refill:  0      Signed,  Jayshon Dommer T. Sidnie Swalley, MD   Outpatient Encounter Medications as of 03/26/2021  Medication Sig   amoxicillin-clavulanate (AUGMENTIN) 875-125 MG tablet Take 1 tablet by mouth 2 (two) times daily.   cetirizine-pseudoephedrine (ZYRTEC-D) 5-120 MG tablet Take 1 tablet by mouth as needed for allergies.   escitalopram (LEXAPRO) 20 MG tablet Take 1 tablet (20 mg total) by mouth daily.   estradiol (ESTRACE) 0.1 MG/GM vaginal cream Place 1 Applicatorful vaginally 2 (two) times a week.   fluticasone (FLONASE) 50 MCG/ACT nasal spray Place 2 sprays into both nostrils daily.   ibuprofen (ADVIL) 800 MG tablet Take 1 tablet (800 mg total) by mouth every 8 (eight) hours as needed.   Multiple Vitamin (MULTIVITAMIN) tablet Take 1 tablet by mouth daily.   omeprazole (PRILOSEC) 20 MG capsule Take 1 capsule (20 mg total) by mouth daily.   SUMAtriptan (IMITREX) 25 MG tablet TAKE 1 TABLET BY MOUTH AS NEEDED FOR MIGRAINE. MAY REPEAT IN 2 HOURS IF HEADACHE PERSISTS OR RECURS.   traMADol (ULTRAM) 50 MG tablet Take 1 tablet (50 mg total) by mouth every 6 (six) hours. (Patient taking differently: Take 50 mg by mouth every 6 (six) hours as needed.)   No facility-administered encounter medications on file as of 03/26/2021.

## 2021-03-26 ENCOUNTER — Encounter: Payer: Self-pay | Admitting: Family Medicine

## 2021-03-26 ENCOUNTER — Ambulatory Visit (INDEPENDENT_AMBULATORY_CARE_PROVIDER_SITE_OTHER): Payer: 59 | Admitting: Family Medicine

## 2021-03-26 ENCOUNTER — Other Ambulatory Visit: Payer: Self-pay

## 2021-03-26 VITALS — BP 140/82 | HR 84 | Temp 98.2°F | Ht 64.75 in | Wt 173.8 lb

## 2021-03-26 DIAGNOSIS — J0100 Acute maxillary sinusitis, unspecified: Secondary | ICD-10-CM

## 2021-03-26 DIAGNOSIS — S8011XA Contusion of right lower leg, initial encounter: Secondary | ICD-10-CM | POA: Diagnosis not present

## 2021-03-26 DIAGNOSIS — M79604 Pain in right leg: Secondary | ICD-10-CM | POA: Diagnosis not present

## 2021-03-26 MED ORDER — AMOXICILLIN-POT CLAVULANATE 875-125 MG PO TABS
1.0000 | ORAL_TABLET | Freq: Two times a day (BID) | ORAL | 0 refills | Status: DC
Start: 2021-03-26 — End: 2021-10-01

## 2021-03-27 ENCOUNTER — Encounter: Payer: Self-pay | Admitting: Family Medicine

## 2021-03-27 ENCOUNTER — Other Ambulatory Visit: Payer: Self-pay | Admitting: Family Medicine

## 2021-03-28 ENCOUNTER — Ambulatory Visit: Payer: 59 | Admitting: Family Medicine

## 2021-03-29 ENCOUNTER — Ambulatory Visit: Payer: 59 | Admitting: Family Medicine

## 2021-04-23 ENCOUNTER — Other Ambulatory Visit: Payer: Self-pay | Admitting: Family Medicine

## 2021-04-23 DIAGNOSIS — G43909 Migraine, unspecified, not intractable, without status migrainosus: Secondary | ICD-10-CM

## 2021-06-18 ENCOUNTER — Other Ambulatory Visit: Payer: Self-pay | Admitting: Family Medicine

## 2021-06-18 ENCOUNTER — Ambulatory Visit
Admission: RE | Admit: 2021-06-18 | Discharge: 2021-06-18 | Disposition: A | Discharge status: Home / Self Care | Payer: 59 | Source: Ambulatory Visit | Attending: Family Medicine | Admitting: Family Medicine

## 2021-06-18 DIAGNOSIS — R928 Other abnormal and inconclusive findings on diagnostic imaging of breast: Secondary | ICD-10-CM

## 2021-06-24 ENCOUNTER — Other Ambulatory Visit: Payer: Self-pay | Admitting: Family Medicine

## 2021-06-24 DIAGNOSIS — K2960 Other gastritis without bleeding: Secondary | ICD-10-CM

## 2021-08-16 DIAGNOSIS — L574 Cutis laxa senilis: Secondary | ICD-10-CM | POA: Diagnosis not present

## 2021-09-25 DIAGNOSIS — E663 Overweight: Secondary | ICD-10-CM | POA: Diagnosis not present

## 2021-09-25 DIAGNOSIS — K219 Gastro-esophageal reflux disease without esophagitis: Secondary | ICD-10-CM | POA: Diagnosis not present

## 2021-09-25 DIAGNOSIS — F419 Anxiety disorder, unspecified: Secondary | ICD-10-CM | POA: Diagnosis not present

## 2021-10-01 ENCOUNTER — Encounter: Payer: Self-pay | Admitting: Family Medicine

## 2021-10-01 ENCOUNTER — Ambulatory Visit (INDEPENDENT_AMBULATORY_CARE_PROVIDER_SITE_OTHER): Payer: PPO | Admitting: Family Medicine

## 2021-10-01 ENCOUNTER — Other Ambulatory Visit: Payer: Self-pay

## 2021-10-01 VITALS — BP 136/80 | HR 97 | Temp 97.6°F | Ht 64.75 in | Wt 171.0 lb

## 2021-10-01 DIAGNOSIS — E785 Hyperlipidemia, unspecified: Secondary | ICD-10-CM | POA: Diagnosis not present

## 2021-10-01 DIAGNOSIS — Z23 Encounter for immunization: Secondary | ICD-10-CM

## 2021-10-01 DIAGNOSIS — E538 Deficiency of other specified B group vitamins: Secondary | ICD-10-CM

## 2021-10-01 DIAGNOSIS — R030 Elevated blood-pressure reading, without diagnosis of hypertension: Secondary | ICD-10-CM

## 2021-10-01 DIAGNOSIS — Z1211 Encounter for screening for malignant neoplasm of colon: Secondary | ICD-10-CM

## 2021-10-01 DIAGNOSIS — N952 Postmenopausal atrophic vaginitis: Secondary | ICD-10-CM | POA: Diagnosis not present

## 2021-10-01 DIAGNOSIS — Z853 Personal history of malignant neoplasm of breast: Secondary | ICD-10-CM | POA: Diagnosis not present

## 2021-10-01 DIAGNOSIS — E2839 Other primary ovarian failure: Secondary | ICD-10-CM | POA: Insufficient documentation

## 2021-10-01 LAB — COMPREHENSIVE METABOLIC PANEL
ALT: 12 U/L (ref 0–35)
AST: 15 U/L (ref 0–37)
Albumin: 4.4 g/dL (ref 3.5–5.2)
Alkaline Phosphatase: 57 U/L (ref 39–117)
BUN: 18 mg/dL (ref 6–23)
CO2: 30 mEq/L (ref 19–32)
Calcium: 9.3 mg/dL (ref 8.4–10.5)
Chloride: 101 mEq/L (ref 96–112)
Creatinine, Ser: 0.69 mg/dL (ref 0.40–1.20)
GFR: 91.29 mL/min (ref >60.00)
Glucose, Bld: 79 mg/dL (ref 70–99)
Potassium: 4.2 mEq/L (ref 3.5–5.1)
Sodium: 139 mEq/L (ref 135–145)
Total Bilirubin: 0.4 mg/dL (ref 0.2–1.2)
Total Protein: 7.4 g/dL (ref 6.0–8.3)

## 2021-10-01 LAB — CBC WITH DIFFERENTIAL/PLATELET
Basophils Absolute: 0 10*3/uL (ref 0.0–0.1)
Basophils Relative: 0.6 % (ref 0.0–3.0)
Eosinophils Absolute: 0.1 10*3/uL (ref 0.0–0.7)
Eosinophils Relative: 3.1 % (ref 0.0–5.0)
HCT: 39.6 % (ref 36.0–46.0)
Hemoglobin: 12.8 g/dL (ref 12.0–15.0)
Lymphocytes Relative: 30.9 % (ref 12.0–46.0)
Lymphs Abs: 1.5 10*3/uL (ref 0.7–4.0)
MCHC: 32.3 g/dL (ref 30.0–36.0)
MCV: 85.6 fl (ref 78.0–100.0)
Monocytes Absolute: 0.4 10*3/uL (ref 0.1–1.0)
Monocytes Relative: 7.4 % (ref 3.0–12.0)
Neutro Abs: 2.7 10*3/uL (ref 1.4–7.7)
Neutrophils Relative %: 58 % (ref 43.0–77.0)
Platelets: 253 10*3/uL (ref 150.0–400.0)
RBC: 4.62 Mil/uL (ref 3.87–5.11)
RDW: 14.4 % (ref 11.5–15.5)
WBC: 4.7 10*3/uL (ref 4.0–10.5)

## 2021-10-01 LAB — LIPID PANEL
Cholesterol: 317 mg/dL — ABNORMAL HIGH (ref 0–200)
HDL: 58 mg/dL (ref >39.00)
LDL Cholesterol: 223 mg/dL — ABNORMAL HIGH (ref 0–99)
NonHDL: 259.21
Total CHOL/HDL Ratio: 5
Triglycerides: 179 mg/dL — ABNORMAL HIGH (ref 0.0–149.0)
VLDL: 35.8 mg/dL (ref 0.0–40.0)

## 2021-10-01 LAB — VITAMIN B12: Vitamin B-12: >1550 pg/mL — ABNORMAL HIGH (ref 211–911)

## 2021-10-01 LAB — TSH: TSH: 2.14 u[IU]/mL (ref 0.35–5.50)

## 2021-10-01 NOTE — Assessment & Plan Note (Signed)
In the past  No longer takes B12 Will check level today  Balanced diet  Takes prn omeprazole for GERD (not daily)

## 2021-10-01 NOTE — Progress Notes (Signed)
Subjective:    Patient ID: [name], female    DOB: [birth year], 66 y.o.   MRN: [id number]  This visit occurred during the SARS-CoV-2 public health emergency.  Safety protocols were in place, including screening questions prior to the visit, additional usage of staff PPE, and extensive cleaning of exam room while observing appropriate contact time as indicated for disinfecting solutions.   HPI 66 yo pt of Dr Einar Pheasant here for f/u of chronic health problems   Wt Readings from Last 3 Encounters:  10/01/21 171 lb (77.6 kg)  03/26/21 173 lb 12 oz (78.8 kg)  01/03/21 174 lb 8 oz (79.2 kg)   Usually 167 lb at home  28.68 kg/m  Pretty healthy  Eats well and exercises    Covid status  -had covid in 2021 Chooses not to vaccinate (she masks all that time)  Pna vaccine :  will do today  Zoster status : -interested in later  Tdap 08/2013 Flu shot -wants to get today   Dexa 10/2008 Was normal  She took tamoxifen in the past -took 3 years 2009  Did 37 tx of radiation  No ca and D   No falls in the past 12 months No fractures     Mammogram 11/2020  -has it scheduled for 12/17/2021 (a follow up)  Self breast exam-nothing new, scar tissue on the R H/o of fibrocystic change    GERD-takes omeprazole - just as needed   Hyperlipidemia  Lab Results  Component Value Date   CHOL 271 (H) 09/27/2020   HDL 56.00 09/27/2020   LDLCALC 176 (H) 09/27/2020   LDLDIRECT 209.0 06/04/2019   TRIG 195.0 (H) 09/27/2020   CHOLHDL 5 09/27/2020    Past coronary calcium score of 0 Due for labs  Is statin intolerant Diet is good  Eats better than she used to  No fried food , not much beef at all (occ eats steak)   The 10-year ASCVD risk score (Arnett DK, et al., 2019) is: 7.3%   Values used to calculate the score:     Age: 66 years     Sex: Female     Is Non-Hispanic African American: No     Diabetic: No     Tobacco smoker: No     Systolic Blood Pressure: 169 mmHg     Is BP treated: No      HDL Cholesterol: 56 mg/dL     Total Cholesterol: 271 mg/dL   Vit B12 def in the past  Lab Results  Component Value Date   CVELFYBO17 510 06/04/2019  Not taking any now    Mood/history of anxiety and also migraine Takes lexapro 20 mg daily Does not do well without it  Helps menopause symptoms  PHQ score of 0   Wants to schedule a colonoscopy  GERD  BP Readings from Last 3 Encounters:  10/01/21 136/80  03/26/21 140/82  01/03/21 138/82    Pulse Readings from Last 3 Encounters:  10/01/21 97  03/26/21 84  01/03/21 86    Patient Active Problem List   Diagnosis Date Noted   Estrogen deficiency 10/01/2021   Colon cancer screening 10/01/2021   Hormone replacement therapy (HRT) 06/09/2019   Elevated blood pressure reading without diagnosis of hypertension 01/13/2019   Vaginal atrophy 12/15/2018   Family history of early CAD 07/08/2018   Allergic rhinitis 06/25/2018   Breast mass, right 05/25/2018   Varicose veins of bilateral lower extremities with pain 06/09/2017   Menopausal symptoms  02/16/2015   Vitamin B 12 deficiency 04/11/2014   Stress incontinence 08/31/2013   Cervico-occipital neuralgia 06/01/2013   HLD (hyperlipidemia) 03/09/2012   Migraine 12/31/2011   Anxiety 12/31/2011   Unspecified mood (affective) disorder ([place]) 12/31/2011   History of breast cancer 12/31/2011   Past Medical History:  Diagnosis Date   Anemia    Anxiety    Blood transfusion without reported diagnosis    Breast cancer ([place]) 02/27/2007   Right   COVID-19 08/30/2020   GERD (gastroesophageal reflux disease)    Hypercholesterolemia    Migraine    Personal history of radiation therapy 2008   Right   Wears dentures    partial upper   Past Surgical History:  Procedure Laterality Date   ABDOMINAL HYSTERECTOMY     BREAST BIOPSY Right 02/27/2007   BREAST LUMPECTOMY Right 04/02/2007   BREAST SURGERY  01/14/2013   breast reduction left side   BROW LIFT Bilateral 06/17/2017    Procedure: BLEPHAROPLASTY UPPER EYELID WITH EXCESS SKIN;  Surgeon: Karle Starch, MD;  Location: [facility];  Service: Ophthalmology;  Laterality: Bilateral;   CESAREAN SECTION     two times   COLONOSCOPY     FOOT SURGERY Right    PTOSIS REPAIR Bilateral 06/17/2017   Procedure: PTOSIS REPAIR RESECT EX;  Surgeon: Karle Starch, MD;  Location: [facility];  Service: Ophthalmology;  Laterality: Bilateral;   REDUCTION MAMMAPLASTY Left    SHOULDER ARTHROSCOPY WITH ROTATOR CUFF REPAIR AND OPEN BICEPS TENODESIS Right 02/01/2019   Procedure: RIGHT SHOULDER ARTHROSCOPY, DEBRIDEMENT, BICEPS TENODESIS, MINI ROTATOR CUFF TEAR REPAIR;  Surgeon: Meredith Pel, MD;  Location: [facility];  Service: Orthopedics;  Laterality: Right;   Social History   Tobacco Use   Smoking status: Never   Smokeless tobacco: Never  Vaping Use   Vaping Use: Never used  Substance Use Topics   Alcohol use: No    Alcohol/week: 0.0 standard drinks   Drug use: No   Family History  Problem Relation Age of Onset   Thyroid disease Mother    Hypertension Mother    Hyperlipidemia Mother    Alzheimer's disease Mother    Heart failure Mother    Esophageal cancer Father    Hyperlipidemia Father    Heart murmur Father    Colon polyps Father    COPD Father    Diabetes Sister    Alzheimer's disease Maternal Aunt    Alzheimer's disease Maternal Uncle    Diabetes Paternal Uncle    Alzheimer's disease Maternal Grandmother    Diabetes Paternal Grandmother    Breast cancer Sister    Irritable bowel syndrome Sister    Ulcerative colitis Sister    Rheum arthritis Sister    Breast cancer Cousin    Breast cancer Cousin    Colon cancer Neg Hx    Allergies  Allergen Reactions   Statins Other (See Comments)    Muscle aches   Current Outpatient Medications on File Prior to Visit  Medication Sig Dispense Refill   cetirizine-pseudoephedrine (ZYRTEC-D) 5-120 MG tablet Take 1 tablet by mouth as  needed for allergies.     escitalopram (LEXAPRO) 20 MG tablet TAKE 1 TABLET BY MOUTH EVERY DAY 90 tablet 2   fluticasone (FLONASE) 50 MCG/ACT nasal spray Place 2 sprays into both nostrils daily. 16 g 6   ibuprofen (ADVIL) 800 MG tablet Take 1 tablet (800 mg total) by mouth every 8 (eight) hours as needed. 90 tablet 0  Multiple Vitamin (MULTIVITAMIN) tablet Take 1 tablet by mouth daily.     omeprazole (PRILOSEC) 20 MG capsule TAKE 1 CAPSULE BY MOUTH EVERY DAY 90 capsule 1   SUMAtriptan (IMITREX) 25 MG tablet TAKE 1 TABLET BY MOUTH AS NEEDED FOR MIGRAINE. MAY REPEAT IN 2 HOURS IF HEADACHE PERSISTS OR RECURS. 9 tablet 2   traMADol (ULTRAM) 50 MG tablet Take 1 tablet (50 mg total) by mouth every 6 (six) hours. (Patient taking differently: Take 50 mg by mouth every 6 (six) hours as needed.) 30 tablet 0   No current facility-administered medications on file prior to visit.    Review of Systems  Constitutional:  Negative for activity change, appetite change, fatigue, fever and unexpected weight change.  HENT:  Negative for congestion, ear pain, rhinorrhea, sinus pressure and sore throat.   Eyes:  Negative for pain, redness and visual disturbance.  Respiratory:  Negative for cough, shortness of breath and wheezing.   Cardiovascular:  Negative for chest pain and palpitations.  Gastrointestinal:  Negative for abdominal pain, blood in stool, constipation and diarrhea.  Endocrine: Negative for polydipsia and polyuria.  Genitourinary:  Negative for dysuria, frequency and urgency.  Musculoskeletal:  Negative for arthralgias, back pain and myalgias.  Skin:  Negative for pallor and rash.  Allergic/Immunologic: Negative for environmental allergies.  Neurological:  Negative for dizziness, syncope and headaches.  Hematological:  Negative for adenopathy. Does not bruise/bleed easily.  Psychiatric/Behavioral:  Negative for decreased concentration and dysphoric mood. The patient is not nervous/anxious.        Objective:   Physical Exam Constitutional:      General: She is not in acute distress.    Appearance: Normal appearance. She is well-developed and normal weight. She is not ill-appearing or diaphoretic.  HENT:     Head: Normocephalic and atraumatic.     Right Ear: Tympanic membrane, ear canal and external ear normal.     Left Ear: Tympanic membrane, ear canal and external ear normal.     Ears:     Comments: Scant cerumen in ear canals    Nose: Nose normal. No congestion.     Mouth/Throat:     Mouth: Mucous membranes are moist.     Pharynx: Oropharynx is clear. No posterior oropharyngeal erythema.  Eyes:     General: No scleral icterus.    Extraocular Movements: Extraocular movements intact.     Conjunctiva/sclera: Conjunctivae normal.     Pupils: Pupils are equal, round, and reactive to light.  Neck:     Thyroid: No thyromegaly.     Vascular: No carotid bruit or JVD.  Cardiovascular:     Rate and Rhythm: Normal rate and regular rhythm.     Pulses: Normal pulses.     Heart sounds: Normal heart sounds.    No gallop.  Pulmonary:     Effort: Pulmonary effort is normal. No respiratory distress.     Breath sounds: Normal breath sounds. No wheezing.     Comments: Good air exch Chest:     Chest wall: No tenderness.  Abdominal:     General: Bowel sounds are normal. There is no distension or abdominal bruit.     Palpations: Abdomen is soft. There is no mass.     Tenderness: There is no abdominal tenderness.     Hernia: No hernia is present.  Musculoskeletal:        General: No tenderness. Normal range of motion.     Cervical back: Normal range of motion and neck  supple. No rigidity. No muscular tenderness.     Right lower leg: No edema.     Left lower leg: No edema.  Lymphadenopathy:     Cervical: No cervical adenopathy.  Skin:    General: Skin is warm and dry.     Coloration: Skin is not pale.     Findings: No erythema or rash.     Comments: Solar lentigines diffusely    Neurological:     Mental Status: She is alert. Mental status is at baseline.     Cranial Nerves: No cranial nerve deficit.     Motor: No abnormal muscle tone.     Coordination: Coordination normal.     Gait: Gait normal.     Deep Tendon Reflexes: Reflexes are normal and symmetric. Reflexes normal.  Psychiatric:        Mood and Affect: Mood normal.        Cognition and Memory: Cognition and memory normal.     Comments: Pleasant           Assessment & Plan:   Problem List Items Addressed This Visit       Genitourinary   Vaginal atrophy    No longer using estrace cream        Other   History of breast cancer    Mammogram is scheduled for 12/17/21        HLD (hyperlipidemia) - Primary    Lab today  Disc goals for lipids and reasons to control them Rev last labs with pt Rev low sat fat diet in detail Pt is intolerant of statins  Past coronary ca score of 0 (fam h/o CAD)       Relevant Orders   Lipid panel   Vitamin B 12 deficiency    In the past  No longer takes B12 Will check level today  Balanced diet  Takes prn omeprazole for GERD (not daily)      Relevant Orders   CBC with Differential/Platelet   Vitamin B12   Elevated blood pressure reading without diagnosis of hypertension    Better on 2nd check today  BP Readings from Last 3 Encounters:  10/01/21 136/80  03/26/21 140/82  01/03/21 138/82   Will check labs Discussed need to monitor  Disc proper way to take home bp Adv DASH eating /less processed foods      Relevant Orders   CBC with Differential/Platelet   Comprehensive metabolic panel   Lipid panel   TSH   Estrogen deficiency    Screening dexa ordered No falls or fx in 12 months Disc recommendation for ca and D for bone health      Relevant Orders   DG Bone Density   Colon cancer screening    Due for colonoscopy-over 10 y ago In interim had neg cologaurd   Referral done       Relevant Orders   Ambulatory referral to  Gastroenterology   Other Visit Diagnoses     Need for influenza vaccination       Relevant Orders   Flu Vaccine QUAD High Dose(Fluad) (Completed)   Need for pneumococcal vaccination       Relevant Orders   Pneumococcal conjugate vaccine 20-valent (Prevnar 20) (Completed)

## 2021-10-01 NOTE — Assessment & Plan Note (Signed)
Better on 2nd check today  BP Readings from Last 3 Encounters:  10/01/21 136/80  03/26/21 140/82  01/03/21 138/82   Will check labs Discussed need to monitor  Disc proper way to take home bp Adv DASH eating [employer] processed foods

## 2021-10-01 NOTE — Assessment & Plan Note (Signed)
Lab today  Disc goals for lipids and reasons to control them Rev last labs with pt Rev low sat fat diet in detail Pt is intolerant of statins  Past coronary ca score of 0 (fam h/o CAD)

## 2021-10-01 NOTE — Patient Instructions (Addendum)
If you are interested in the new shingles vaccine (Shingrix) - call your local pharmacy to check on coverage and availability  If affordable, get on a wait list at your pharmacy to get the vaccine.  Try to get 1200-1500 mg of calcium per day with at least 1000 iu of vitamin D - for bone health   Flu shot today  Pneumonia shot today   Call and schedule your bone density test at [facility]   .Please call the location of your choice from the menu below to schedule your Mammogram and/or Bone Density appointment.    [facility]                      Phone:  [phone], [place] [address]                                                             Services: Traditional and 3D Mammogram, [place] [facility] Density                 Phone: [phone] [address], [place] [address]    Service: Bone Density ONLY   *this site does NOT perform mammograms  [facility]                        Phone:  [phone] [address], [place] [address]                                            Services:  3D Mammogram and [place] at [facility]   Phone:  [phone]   [address] [place], [place] [address]  Services: 3D Mammogram and Bone Density  [place] at [facility])  Phone:  [phone]   [address] Room [place], [place] [address]                                              Services:  3D Mammogram and Bone Density

## 2021-10-01 NOTE — Assessment & Plan Note (Signed)
No longer using estrace cream

## 2021-10-01 NOTE — Assessment & Plan Note (Signed)
Due for colonoscopy-over 10 y ago In interim had neg cologaurd   Referral done

## 2021-10-01 NOTE — Assessment & Plan Note (Signed)
Mammogram is scheduled for 12/17/21

## 2021-10-01 NOTE — Assessment & Plan Note (Signed)
Screening dexa ordered No falls or fx in 12 months Disc recommendation for ca and D for bone health

## 2021-10-10 ENCOUNTER — Other Ambulatory Visit: Payer: Self-pay

## 2021-10-10 ENCOUNTER — Encounter: Payer: Self-pay | Admitting: Family Medicine

## 2021-10-10 ENCOUNTER — Ambulatory Visit (INDEPENDENT_AMBULATORY_CARE_PROVIDER_SITE_OTHER): Payer: PPO | Admitting: Family Medicine

## 2021-10-10 VITALS — BP 140/92 | HR 79 | Temp 97.7°F | Ht 64.75 in | Wt 171.0 lb

## 2021-10-10 DIAGNOSIS — Z853 Personal history of malignant neoplasm of breast: Secondary | ICD-10-CM | POA: Diagnosis not present

## 2021-10-10 DIAGNOSIS — M545 Low back pain, unspecified: Secondary | ICD-10-CM | POA: Diagnosis not present

## 2021-10-10 DIAGNOSIS — R630 Anorexia: Secondary | ICD-10-CM

## 2021-10-10 DIAGNOSIS — R110 Nausea: Secondary | ICD-10-CM

## 2021-10-10 DIAGNOSIS — R1084 Generalized abdominal pain: Secondary | ICD-10-CM

## 2021-10-10 NOTE — Patient Instructions (Addendum)
Red Yeast Rice  gfh

## 2021-10-10 NOTE — Progress Notes (Signed)
Leah Lamarre T. Luwanda Starr, MD, [place] at [facility] [place], [address]  Phone: [phone]   FAX: [place] - 66 y.o. female   MRN [id number]   Date of Birth: 1955-10-21  Date: 10/10/2021   PCP: Lesleigh Noe, MD   Referral: Lesleigh Noe, MD  Chief Complaint  Patient presents with   Back Pain    Lower back-Radiates around to abdomen   Fatigue         This visit occurred during the SARS-CoV-2 public health emergency.  Safety protocols were in place, including screening questions prior to the visit, additional usage of staff PPE, and extensive cleaning of exam room while observing appropriate contact time as indicated for disinfecting solutions.   Subjective:   [name] is a 66 y.o. very pleasant female patient with Body mass index is 28.68 kg/m. who presents with the following:  She is a very nice well-known lady, she presents with some ongoing back pain.  Cholesterol is terrible.  Did have a coronary calcium score of 0 at 2019 Lipids:    Component Value Date/Time   CHOL 317 (H) 10/01/2021 1038   TRIG 179.0 (H) 10/01/2021 1038   HDL 58.00 10/01/2021 1038   LDLDIRECT 209.0 06/04/2019 0939   VLDL 35.8 10/01/2021 1038   CHOLHDL 5 10/01/2021 1038     Tired.  Dep? Sleeping ok? No depression  She is having some back and flank pain, but on secondary questioning her primary source of pain is in the anterior abdomen as well as in the hypogastric region.  She has also felt some nauseousness as well as fullness.  She is eating and drinking okay and passing bowel movements.  She denies melena or bright red blood per rectum.  Recent labs also show a elevated B12 level.  She is not taking any supplementation.  No major history of back issues.   Does take a daily vitamin.   Has been on multiple   Colonoscopy?  Had cologuard   Review of Systems is noted in the HPI, as appropriate    Patient Active Problem List   Diagnosis Date Noted   Elevated blood pressure reading without diagnosis of hypertension 01/13/2019   Family history of early CAD 07/08/2018   Vitamin B 12 deficiency 04/11/2014   Cervico-occipital neuralgia 06/01/2013   HLD (hyperlipidemia) 03/09/2012   Migraine 12/31/2011   Anxiety 12/31/2011   Unspecified mood (affective) disorder ([place]) 12/31/2011   History of breast cancer 12/31/2011    Past Medical History:  Diagnosis Date   Anemia    Anxiety    Blood transfusion without reported diagnosis    Breast cancer ([place]) 02/27/2007   Right   COVID-19 08/30/2020   GERD (gastroesophageal reflux disease)    Hypercholesterolemia    Migraine    Personal history of radiation therapy 2008   Right   Wears dentures    partial upper    Past Surgical History:  Procedure Laterality Date   ABDOMINAL HYSTERECTOMY     BREAST BIOPSY Right 02/27/2007   BREAST LUMPECTOMY Right 04/02/2007   BREAST SURGERY  01/14/2013   breast reduction left side   BROW LIFT Bilateral 06/17/2017   Procedure: BLEPHAROPLASTY UPPER EYELID WITH EXCESS SKIN;  Surgeon: Karle Starch, MD;  Location: [facility];  Service: Ophthalmology;  Laterality: Bilateral;   CESAREAN SECTION     two times   COLONOSCOPY  FOOT SURGERY Right    PTOSIS REPAIR Bilateral 06/17/2017   Procedure: PTOSIS REPAIR RESECT EX;  Surgeon: Karle Starch, MD;  Location: [facility];  Service: Ophthalmology;  Laterality: Bilateral;   REDUCTION MAMMAPLASTY Left    SHOULDER ARTHROSCOPY WITH ROTATOR CUFF REPAIR AND OPEN BICEPS TENODESIS Right 02/01/2019   Procedure: RIGHT SHOULDER ARTHROSCOPY, DEBRIDEMENT, BICEPS TENODESIS, MINI ROTATOR CUFF TEAR REPAIR;  Surgeon: Meredith Pel, MD;  Location: [facility];  Service: Orthopedics;  Laterality: Right;    Family History  Problem Relation Age of Onset   Thyroid disease Mother    Hypertension Mother    Hyperlipidemia Mother     Alzheimer's disease Mother    Heart failure Mother    Esophageal cancer Father    Hyperlipidemia Father    Heart murmur Father    Colon polyps Father    COPD Father    Diabetes Sister    Alzheimer's disease Maternal Aunt    Alzheimer's disease Maternal Uncle    Diabetes Paternal Uncle    Alzheimer's disease Maternal Grandmother    Diabetes Paternal Grandmother    Breast cancer Sister    Irritable bowel syndrome Sister    Ulcerative colitis Sister    Rheum arthritis Sister    Breast cancer Cousin    Breast cancer Cousin    Colon cancer Neg Hx      Objective:   BP (!) 140/92    Pulse 79    Temp 97.7 F (36.5 C) (Temporal)    Ht 5' 4.75" (1.645 m)    Wt 171 lb (77.6 kg)    SpO2 97%    BMI 28.68 kg/m   GEN: no acute distress. HEENT: Atraumatic, Normocephalic.  Ears and Nose: No external deformity. CV: RRR, No M/G/R. No JVD. No thrill. No extra heart sounds. PULM: CTA B, no wheezes, crackles, rhonchi. No retractions. No resp. distress. No accessory muscle use. ABD: S, positive centrally, hypogastric as well as right and left lower quadrants, ND, +BS. No rebound. No HSM. EXTR: No c/c/e PSYCH: Normally interactive. Conversant.     Range of motion at  the waist: Flexion: normal Extension: normal Lateral bending: normal Rotation: all normal  No echymosis or edema Rises to examination table with no difficulty Gait: non antalgic  Inspection/Deformity: N Paraspinus Tenderness: Mild, L4-S1, also with some pain at the flank, more on the right  B Ankle Dorsiflexion (L5,4): 5/5 B Great Toe Dorsiflexion (L5,4): 5/5 Heel Walk (L5): WNL Toe Walk (S1): WNL Rise/Squat (L4): WNL  SENSORY B Medial Foot (L4): WNL B Dorsum (L5): WNL B Lateral (S1): WNL Light Touch: WNL  REFLEXES Knee (L4): 2+ Ankle (S1): 2+  B SLR, seated: neg B SLR, supine: neg B Greater Troch: NT B Log Roll: neg B Sciatic Notch: NT   Radiology: Results for orders placed or performed in visit on  10/01/21  CBC with Differential/Platelet  Result Value Ref Range   WBC 4.7 4.0 - 10.5 K/uL   RBC 4.62 3.87 - 5.11 Mil/uL   Hemoglobin 12.8 12.0 - 15.0 g/dL   HCT 39.6 36.0 - 46.0 %   MCV 85.6 78.0 - 100.0 fl   MCHC 32.3 30.0 - 36.0 g/dL   RDW 14.4 11.5 - 15.5 %   Platelets 253.0 150.0 - 400.0 K/uL   Neutrophils Relative % 58.0 43.0 - 77.0 %   Lymphocytes Relative 30.9 12.0 - 46.0 %   Monocytes Relative 7.4 3.0 - 12.0 %   Eosinophils Relative  3.1 0.0 - 5.0 %   Basophils Relative 0.6 0.0 - 3.0 %   Neutro Abs 2.7 1.4 - 7.7 K/uL   Lymphs Abs 1.5 0.7 - 4.0 K/uL   Monocytes Absolute 0.4 0.1 - 1.0 K/uL   Eosinophils Absolute 0.1 0.0 - 0.7 K/uL   Basophils Absolute 0.0 0.0 - 0.1 K/uL  Comprehensive metabolic panel  Result Value Ref Range   Sodium 139 135 - 145 mEq/L   Potassium 4.2 3.5 - 5.1 mEq/L   Chloride 101 96 - 112 mEq/L   CO2 30 19 - 32 mEq/L   Glucose, Bld 79 70 - 99 mg/dL   BUN 18 6 - 23 mg/dL   Creatinine, Ser 0.69 0.40 - 1.20 mg/dL   Total Bilirubin 0.4 0.2 - 1.2 mg/dL   Alkaline Phosphatase 57 39 - 117 U/L   AST 15 0 - 37 U/L   ALT 12 0 - 35 U/L   Total Protein 7.4 6.0 - 8.3 g/dL   Albumin 4.4 3.5 - 5.2 g/dL   GFR 91.29 >60.00 mL/min   Calcium 9.3 8.4 - 10.5 mg/dL  Lipid panel  Result Value Ref Range   Cholesterol 317 (H) 0 - 200 mg/dL   Triglycerides 179.0 (H) 0.0 - 149.0 mg/dL   HDL 58.00 >39.00 mg/dL   VLDL 35.8 0.0 - 40.0 mg/dL   LDL Cholesterol 223 (H) 0 - 99 mg/dL   Total CHOL/HDL Ratio 5    NonHDL 259.21   TSH  Result Value Ref Range   TSH 2.14 0.35 - 5.50 uIU/mL  Vitamin B12  Result Value Ref Range   Vitamin B-12 >1550 (H) 211 - 911 pg/mL     Assessment and Plan:     ICD-10-CM   1. Generalized abdominal pain  R10.84 CT Abdomen Pelvis W Contrast    2. Decreased appetite  R63.0 CT Abdomen Pelvis W Contrast    3. Nauseated  R11.0 CT Abdomen Pelvis W Contrast    4. Acute bilateral low back pain without sciatica  M54.50     5. History of breast  cancer  Z85.3 CT Abdomen Pelvis W Contrast     This is really the case more about abdominal pain, and generalized abdominal pain without a clear source.  Known history of prior breast cancer.  Obtain a CT of the abdomen and pelvis to evaluate for potential neoplasm, evaluate liver, gallbladder, or other intra-abdominal pathology as well as pelvic anatomy with hypogastric pain.  No orders of the defined types were placed in this encounter.  There are no discontinued medications. Orders Placed This Encounter  Procedures   CT Abdomen Pelvis W Contrast    Follow-up: No follow-ups on file.  Dr[employer] One speech-to-text software was used for transcription in this dictation.  Possible transcriptional errors can occur using Editor, commissioning.   Signed,  [name]. Truett Mcfarlan, MD   Outpatient Encounter Medications as of 10/10/2021  Medication Sig   cetirizine-pseudoephedrine (ZYRTEC-D) 5-120 MG tablet Take 1 tablet by mouth as needed for allergies.   escitalopram (LEXAPRO) 20 MG tablet TAKE 1 TABLET BY MOUTH EVERY DAY   fluticasone (FLONASE) 50 MCG/ACT nasal spray Place 2 sprays into both nostrils daily.   ibuprofen (ADVIL) 800 MG tablet Take 1 tablet (800 mg total) by mouth every 8 (eight) hours as needed.   Multiple Vitamin (MULTIVITAMIN) tablet Take 1 tablet by mouth daily.   omeprazole (PRILOSEC) 20 MG capsule TAKE 1 CAPSULE BY MOUTH EVERY DAY   SUMAtriptan (IMITREX) 25  MG tablet TAKE 1 TABLET BY MOUTH AS NEEDED FOR MIGRAINE. MAY REPEAT IN 2 HOURS IF HEADACHE PERSISTS OR RECURS.   traMADol (ULTRAM) 50 MG tablet Take 1 tablet (50 mg total) by mouth every 6 (six) hours. (Patient taking differently: Take 50 mg by mouth every 6 (six) hours as needed.)   No facility-administered encounter medications on file as of 10/10/2021.

## 2021-10-11 ENCOUNTER — Encounter: Payer: Self-pay | Admitting: *Deleted

## 2021-10-22 DIAGNOSIS — H903 Sensorineural hearing loss, bilateral: Secondary | ICD-10-CM | POA: Diagnosis not present

## 2021-10-22 DIAGNOSIS — J301 Allergic rhinitis due to pollen: Secondary | ICD-10-CM | POA: Diagnosis not present

## 2021-10-22 DIAGNOSIS — J342 Deviated nasal septum: Secondary | ICD-10-CM | POA: Diagnosis not present

## 2021-10-30 ENCOUNTER — Other Ambulatory Visit: Payer: Self-pay

## 2021-10-30 ENCOUNTER — Ambulatory Visit
Admission: RE | Admit: 2021-10-30 | Discharge: 2021-10-30 | Disposition: A | Discharge status: Home / Self Care | Payer: PPO | Source: Ambulatory Visit | Attending: Family Medicine | Admitting: Family Medicine

## 2021-10-30 DIAGNOSIS — R110 Nausea: Secondary | ICD-10-CM | POA: Diagnosis not present

## 2021-10-30 DIAGNOSIS — Z853 Personal history of malignant neoplasm of breast: Secondary | ICD-10-CM | POA: Diagnosis not present

## 2021-10-30 DIAGNOSIS — R630 Anorexia: Secondary | ICD-10-CM | POA: Insufficient documentation

## 2021-10-30 DIAGNOSIS — R1084 Generalized abdominal pain: Secondary | ICD-10-CM | POA: Diagnosis not present

## 2021-10-30 DIAGNOSIS — R1110 Vomiting, unspecified: Secondary | ICD-10-CM | POA: Diagnosis not present

## 2021-10-30 DIAGNOSIS — I700 Atherosclerosis of aorta: Secondary | ICD-10-CM | POA: Diagnosis not present

## 2021-10-30 DIAGNOSIS — R109 Unspecified abdominal pain: Secondary | ICD-10-CM | POA: Diagnosis not present

## 2021-10-30 MED ORDER — IOHEXOL 300 MG/ML  SOLN
100.0000 mL | Freq: Once | INTRAMUSCULAR | Status: AC | PRN
  Administered 2021-10-30: 100 mL via INTRAVENOUS

## 2021-11-01 ENCOUNTER — Encounter: Payer: Self-pay | Admitting: Family Medicine

## 2021-11-05 ENCOUNTER — Telehealth: Payer: Self-pay | Admitting: Family Medicine

## 2021-11-05 ENCOUNTER — Other Ambulatory Visit: Payer: PPO

## 2021-11-05 NOTE — Telephone Encounter (Signed)
Pt called stating that she would like to switch providers. Pt is asking to be switch from [place] to [place]. Please advise.

## 2021-11-05 NOTE — Telephone Encounter (Signed)
Agree with questions from [place]. But ultimately OK with what patients wishes are

## 2021-11-06 NOTE — Telephone Encounter (Signed)
Pt was with her Granddaughter, Tawni Pummel, who saw Lawerance Bach and she felt that she would be a better fit as her PCP. Pt will call in near future to setup a TOC appt.

## 2021-11-08 ENCOUNTER — Other Ambulatory Visit: Payer: Self-pay | Admitting: Family Medicine

## 2021-11-08 ENCOUNTER — Telehealth: Payer: Self-pay | Admitting: Family Medicine

## 2021-11-08 DIAGNOSIS — G43909 Migraine, unspecified, not intractable, without status migrainosus: Secondary | ICD-10-CM

## 2021-11-08 DIAGNOSIS — E782 Mixed hyperlipidemia: Secondary | ICD-10-CM

## 2021-11-08 DIAGNOSIS — I700 Atherosclerosis of aorta: Secondary | ICD-10-CM

## 2021-11-08 DIAGNOSIS — Z789 Other specified health status: Secondary | ICD-10-CM

## 2021-11-08 NOTE — Telephone Encounter (Signed)
I already got a [employer] and reviewed.

## 2021-11-08 NOTE — Telephone Encounter (Signed)
We have reviewed all findings in detail.  Discussed with patient constipation treatment.  Aortic atherosclerosis with a total cholesterol greater than 300 and an LDL greater than 200.  Unable to take statins, i am gonna consult the advanced lipid clinic.  Cc: [name]  The patient wants to transfer care to [name] from Dr. Einar Pheasant.  She is a very nice patient who I have known for for many years along with other family members.  There would be no barriers, and she has always been a pleasure to see.  She is planning on doing a transfer of care visit in the near future.    ICD-10-CM   1. Aortic atherosclerosis (HCC)  I70.0 AMB Referral to [facility]    2. Statin intolerance  Z78.9 AMB Referral to [facility]    3. Mixed hyperlipidemia  E78.2 AMB Referral to [facility]       Orders Placed This Encounter  Procedures   AMB Referral to [facility]

## 2021-11-08 NOTE — Telephone Encounter (Signed)
[name] called in and wanted to know about ct scan she had done, saw that it was reviewed but no one called to give her the results

## 2021-11-09 NOTE — Telephone Encounter (Signed)
That is fine with me.

## 2021-11-12 DIAGNOSIS — L03032 Cellulitis of left toe: Secondary | ICD-10-CM | POA: Diagnosis not present

## 2021-11-12 DIAGNOSIS — L03031 Cellulitis of right toe: Secondary | ICD-10-CM | POA: Diagnosis not present

## 2021-11-21 ENCOUNTER — Other Ambulatory Visit: Payer: Self-pay

## 2021-11-21 ENCOUNTER — Encounter: Payer: Self-pay | Admitting: Ophthalmology

## 2021-11-26 DIAGNOSIS — L03031 Cellulitis of right toe: Secondary | ICD-10-CM | POA: Diagnosis not present

## 2021-11-26 DIAGNOSIS — L03032 Cellulitis of left toe: Secondary | ICD-10-CM | POA: Diagnosis not present

## 2021-11-28 NOTE — Discharge Instructions (Signed)

## 2021-11-29 ENCOUNTER — Other Ambulatory Visit: Payer: Self-pay

## 2021-11-29 ENCOUNTER — Encounter: Payer: Self-pay | Admitting: Family

## 2021-11-29 ENCOUNTER — Ambulatory Visit (INDEPENDENT_AMBULATORY_CARE_PROVIDER_SITE_OTHER): Payer: PPO | Admitting: Family

## 2021-11-29 VITALS — BP 132/78 | HR 74 | Ht 64.5 in | Wt 174.0 lb

## 2021-11-29 DIAGNOSIS — Z789 Other specified health status: Secondary | ICD-10-CM

## 2021-11-29 DIAGNOSIS — T466X5A Adverse effect of antihyperlipidemic and antiarteriosclerotic drugs, initial encounter: Secondary | ICD-10-CM

## 2021-11-29 DIAGNOSIS — G43119 Migraine with aura, intractable, without status migrainosus: Secondary | ICD-10-CM | POA: Diagnosis not present

## 2021-11-29 DIAGNOSIS — E782 Mixed hyperlipidemia: Secondary | ICD-10-CM | POA: Diagnosis not present

## 2021-11-29 DIAGNOSIS — R7989 Other specified abnormal findings of blood chemistry: Secondary | ICD-10-CM | POA: Diagnosis not present

## 2021-11-29 DIAGNOSIS — M791 Myalgia, unspecified site: Secondary | ICD-10-CM

## 2021-11-29 DIAGNOSIS — R030 Elevated blood-pressure reading, without diagnosis of hypertension: Secondary | ICD-10-CM | POA: Diagnosis not present

## 2021-11-29 MED ORDER — EZETIMIBE 10 MG PO TABS: 10.0000 mg | ORAL_TABLET | Freq: Every day | ORAL | 3 refills | Status: DC

## 2021-11-29 NOTE — Patient Instructions (Signed)
Start red yeast rice and omega three fish oils.  ?I am sending prescription for Zetia to the pharmacy.  ? ?A referral was placed today for cardiologist. ?Please let us know if you have not heard back within 1 week about your referral. ? ?Start monitoring your blood pressure daily, around the same time of day, for the next 2-3 weeks.  Ensure that you have rested for 30 minutes prior to checking your blood pressure. Record your readings and bring them to your next visit. ? ?It was a pleasure seeing you today! Please do not hesitate to reach out with any questions and or concerns. ? ?Regards,  ? ?Samiha Denapoli ?FNP-C ? ? ?

## 2021-11-29 NOTE — Progress Notes (Signed)
Established Patient Office Visit  Subjective:  Patient ID: [name], female    DOB: [birth year]  Age: 66 y.o. MRN: [id number]  CC:  Chief Complaint  Patient presents with   tranfer of care    Pt stated--concern regarding vitamin B.    HPI [name] is here for a transition of care visit.  Prior provider was: Gweneth Dimitri Pt is with acute concerns:   Increased b12 on last lab work back in January,  Only thing she may have been taking at the time was biotin, no longer taking.  Doesn't take herbal supplements, no drinks or extra vitamin shakes. She was drinking a shake daily a few months ago think that may have had b12 but not sure.   Mammogram: going every six months. Needs repeat from November, pt states there was a spot in left breast but monitoring stability. Has apt April 3rd, 2023.  chronic concerns:  Migraine: frequency every few months if even, settled out after menopause primarily were hormone driver. Can take ibuprofen tylenol with relief. She will feel some left temporal twitching resolved with ibuprofen.   Hyperlipidemia: has appt in august a the lipid clinic but not until august. Hsa taken crestor 5 mg tid, pravastatin,and simvastatin 10 mg once daily both with myalgias. She has tried natural alternatives in the past as well including red yeast rice, but states didn't try for a long enough period of time. She did see cardiologist back in 2020, they were trial with crestor which pt did not tolerate. Calcium score was 0, 07/27/18. He had suggested maybe repatha and or addition of zetia.   GAD: taking lexapro 20 mg has been taking for years. Increased stress as of lately, mom with dementia brother with open heart surgery and sister with cancer, a lot going on currently. She is primary caregiver.   Elevated BP without dx of HTN: doesn't really check it at home.   Past Medical History:  Diagnosis Date   Anemia    Anxiety    Blood transfusion without reported  diagnosis    Breast cancer (HCC) 02/27/2007   Right   COVID-19 08/30/2020   GERD (gastroesophageal reflux disease)    Hypercholesterolemia    Migraine    Hx of in past   Personal history of radiation therapy 2008   Right   Wears dentures    partial upper and lower    Past Surgical History:  Procedure Laterality Date   ABDOMINAL HYSTERECTOMY     BREAST BIOPSY Right 02/27/2007   BREAST LUMPECTOMY Right 04/02/2007   BREAST SURGERY  01/14/2013   breast reduction left side   BROW LIFT Bilateral 06/17/2017   Procedure: BLEPHAROPLASTY UPPER EYELID WITH EXCESS SKIN;  Surgeon: Imagene Riches, MD;  Location: [facility];  Service: Ophthalmology;  Laterality: Bilateral;   CESAREAN SECTION     two times   COLONOSCOPY     FOOT SURGERY Right    PTOSIS REPAIR Bilateral 06/17/2017   Procedure: PTOSIS REPAIR RESECT EX;  Surgeon: Imagene Riches, MD;  Location: [facility];  Service: Ophthalmology;  Laterality: Bilateral;   REDUCTION MAMMAPLASTY Left    SHOULDER ARTHROSCOPY WITH ROTATOR CUFF REPAIR AND OPEN BICEPS TENODESIS Right 02/01/2019   Procedure: RIGHT SHOULDER ARTHROSCOPY, DEBRIDEMENT, BICEPS TENODESIS, MINI ROTATOR CUFF TEAR REPAIR;  Surgeon: Cammy Copa, MD;  Location: [facility];  Service: Orthopedics;  Laterality: Right;    Family History  Problem Relation Age of Onset  Thyroid disease Mother    Hypertension Mother    Hyperlipidemia Mother    Alzheimer's disease Mother    Heart failure Mother    Esophageal cancer Father    Hyperlipidemia Father    Heart murmur Father    Colon polyps Father    COPD Father    Diabetes Sister    Alzheimer's disease Maternal Aunt    Alzheimer's disease Maternal Uncle    Diabetes Paternal Uncle    Alzheimer's disease Maternal Grandmother    Diabetes Paternal Grandmother    Breast cancer Sister    Irritable bowel syndrome Sister    Ulcerative colitis Sister    Rheum arthritis Sister    Breast cancer Cousin     Breast cancer Cousin    Colon cancer Neg Hx     Social History   Socioeconomic History   Marital status: Married    Spouse name: Onalee Hua   Number of children: 2   Years of education: high school   Highest education level: Not on file  Occupational History   Not on file  Tobacco Use   Smoking status: Never   Smokeless tobacco: Never  Vaping Use   Vaping Use: Never used  Substance and Sexual Activity   Alcohol use: No    Alcohol/week: 0.0 standard drinks   Drug use: No   Sexual activity: Yes    Partners: Male    Birth control/protection: Surgical  Other Topics Concern   Not on file  Social History Narrative   01/25/20   From: the area   Living: with husband, Onalee Hua (1980) and her mother   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      Family: 2 daughters - Asher Muir and Annabelle Harman - 3 grandchildren      Enjoys: boating, walking, beach, fishing, outdoor activity      Exercise: keeps busy moving around the house, irregular   Diet: chicken, some read meat, veggies, salads - trying to limit sugar      Safety   Seat belts: Yes    Guns: Yes  and secure   Safe in relationships: Yes    Social Determinants of Corporate investment banker Strain: Not on file  Food Insecurity: Not on file  Transportation Needs: Not on file  Physical Activity: Not on file  Stress: Not on file  Social Connections: Not on file  Intimate Partner Violence: Not on file    Outpatient Medications Prior to Visit  Medication Sig Dispense Refill   cetirizine-pseudoephedrine (ZYRTEC-D) 5-120 MG tablet Take 1 tablet by mouth as needed for allergies.     escitalopram (LEXAPRO) 20 MG tablet TAKE 1 TABLET BY MOUTH EVERY DAY 90 tablet 2   fluticasone (FLONASE) 50 MCG/ACT nasal spray Place 2 sprays into both nostrils daily. 16 g 6   ibuprofen (ADVIL) 800 MG tablet Take 1 tablet (800 mg total) by mouth every 8 (eight) hours as needed. 90 tablet 0   Multiple Vitamin  (MULTIVITAMIN) tablet Take 1 tablet by mouth daily.     omeprazole (PRILOSEC) 20 MG capsule TAKE 1 CAPSULE BY MOUTH EVERY DAY (Patient taking differently: daily as needed.) 90 capsule 1   SUMAtriptan (IMITREX) 25 MG tablet TAKE 1 TABLET BY MOUTH AS NEEDED FOR MIGRAINE. MAY REPEAT IN 2 HOURS IF HEADACHE PERSISTS OR RECURS. 9 tablet 2   traMADol (ULTRAM) 50 MG tablet Take 1 tablet (50 mg total) by mouth every 6 (six) hours. 30 tablet  0   No facility-administered medications prior to visit.    Allergies  Allergen Reactions   Statins Other (See Comments)    Muscle aches    ROS Review of Systems  Review of Systems  Respiratory:  Negative for shortness of breath.   Cardiovascular:  Negative for chest pain and palpitations.  Gastrointestinal:  Negative for constipation and diarrhea.  Genitourinary:  Negative for dysuria, frequency and urgency.  Musculoskeletal:  Negative for myalgias.  Psychiatric/Behavioral:  Negative for depression and suicidal ideas. Increased anxiety with increased stress  All other systems reviewed and are negative.    Objective:    Physical Exam  Gen: NAD, resting comfortably CV: RRR with no murmurs appreciated Pulm: NWOB, CTAB with no crackles, wheezes, or rhonchi Skin: warm, dry Psych: Normal affect and thought content  BP 132/78   Pulse 74   Ht 5' 4.5" (1.638 m)   Wt 174 lb (78.9 kg)   SpO2 98%   BMI 29.41 kg/m  Wt Readings from Last 3 Encounters:  11/29/21 174 lb (78.9 kg)  10/10/21 171 lb (77.6 kg)  10/01/21 171 lb (77.6 kg)     Health Maintenance Due  Topic Date Due   COVID-19 Vaccine (1) Never done   Hepatitis C Screening  Never done   Zoster Vaccines- Shingrix (1 of 2) Never done   Fecal DNA (Cologuard)  06/17/2021   DEXA SCAN  08/29/2021    There are no preventive care reminders to display for this patient.  Lab Results  Component Value Date   TSH 2.14 10/01/2021   Lab Results  Component Value Date   WBC 4.7 10/01/2021    HGB 12.8 10/01/2021   HCT 39.6 10/01/2021   MCV 85.6 10/01/2021   PLT 253.0 10/01/2021   Lab Results  Component Value Date   NA 139 10/01/2021   K 4.2 10/01/2021   CO2 30 10/01/2021   GLUCOSE 79 10/01/2021   BUN 18 10/01/2021   CREATININE 0.69 10/01/2021   BILITOT 0.4 10/01/2021   ALKPHOS 57 10/01/2021   AST 15 10/01/2021   ALT 12 10/01/2021   PROT 7.4 10/01/2021   ALBUMIN 4.4 10/01/2021   CALCIUM 9.3 10/01/2021   ANIONGAP 10 02/01/2019   GFR 91.29 10/01/2021   Lab Results  Component Value Date   CHOL 317 (H) 10/01/2021   Lab Results  Component Value Date   HDL 58.00 10/01/2021   Lab Results  Component Value Date   LDLCALC 223 (H) 10/01/2021   Lab Results  Component Value Date   TRIG 179.0 (H) 10/01/2021   Lab Results  Component Value Date   CHOLHDL 5 10/01/2021   Lab Results  Component Value Date   HGBA1C 6.4 09/27/2020      Assessment & Plan:   Problem List Items Addressed This Visit       Cardiovascular and Mediastinum   Intractable migraine with aura without status migrainosus    Very infrequent has resolved mainly with resolution of menses      Relevant Medications   ezetimibe (ZETIA) 10 MG tablet     Other   HLD (hyperlipidemia)    Patient to restart red yeast rice omega-3 fish oils as well as prescription sent for Zetia 10 mg to take once daily.  Referral to cardiologist in place for resistant hyperlipidemia with statin intolerance.  May have to consider Repatha.  Work on low-cholesterol diet and exercise as tolerated      Relevant Medications   ezetimibe (ZETIA) 10  MG tablet   Other Relevant Orders   Ambulatory referral to Cardiology   Elevated blood pressure reading without diagnosis of hypertension    Advised patient that we need at home blood pressure monitoring.. Pt advised of the following:  Monitor blood pressure periodically and/or when you feel symptomatic. Goal is <130/90 on average. Ensure that you have rested for 30 minutes  prior to checking your blood pressure. Record your readings and bring them to your next visit if necessary.work on a low sodium diet.       Myalgia due to statin   Relevant Orders   Ambulatory referral to Cardiology   Statin intolerance    Patient has tried multiple statins with symptoms every time.  Referral placed for hyperlipidemia clinic however patient not to be seen until August.  Patient with suspected familial hypercholesterolemia, patient to work on low-cholesterol diet and exercise.  Recent calcium score reviewed from 2019 and score was 0.  Referral is to be placed with cardiologist as she has seen him in the past, Dr. Okey Dupre.  Patient may need to start Repatha possibly.      Relevant Orders   Ambulatory referral to Cardiology   High serum vitamin B12 - Primary    Placed order for B12 we will order this at next visit suspect that this may have been a included in her shakes that she was drinking back in January.  But we will see once resulted.  If still abnormally high as patient is not currently supplementing we will take further.        Meds ordered this encounter  Medications   ezetimibe (ZETIA) 10 MG tablet    Sig: Take 1 tablet (10 mg total) by mouth daily.    Dispense:  90 tablet    Refill:  3    Order Specific Question:   Supervising Provider    Answer:   Ermalene Searing, AMY E [[id number]]    Follow-up: Return in about 1 month (around 12/30/2021) for cholesterol follow up and blood pressure, come fasting to appointment.    Mort Sawyers, FNP

## 2021-11-30 ENCOUNTER — Encounter
Admission: RE | Disposition: A | Discharge status: Home / Self Care | Payer: Self-pay | Source: Ambulatory Visit | Attending: Ophthalmology

## 2021-11-30 ENCOUNTER — Other Ambulatory Visit: Payer: Self-pay

## 2021-11-30 ENCOUNTER — Ambulatory Visit: Payer: PPO | Admitting: Anesthesiology

## 2021-11-30 ENCOUNTER — Encounter: Payer: Self-pay | Admitting: Ophthalmology

## 2021-11-30 ENCOUNTER — Ambulatory Visit
Admission: RE | Admit: 2021-11-30 | Discharge: 2021-11-30 | Disposition: A | Discharge status: Home / Self Care | Payer: PPO | Source: Ambulatory Visit | Attending: Ophthalmology | Admitting: Ophthalmology

## 2021-11-30 DIAGNOSIS — H57813 Brow ptosis, bilateral: Secondary | ICD-10-CM | POA: Diagnosis not present

## 2021-11-30 DIAGNOSIS — F419 Anxiety disorder, unspecified: Secondary | ICD-10-CM | POA: Diagnosis not present

## 2021-11-30 DIAGNOSIS — L574 Cutis laxa senilis: Secondary | ICD-10-CM | POA: Diagnosis not present

## 2021-11-30 DIAGNOSIS — K219 Gastro-esophageal reflux disease without esophagitis: Secondary | ICD-10-CM | POA: Insufficient documentation

## 2021-11-30 DIAGNOSIS — Z79899 Other long term (current) drug therapy: Secondary | ICD-10-CM | POA: Insufficient documentation

## 2021-11-30 DIAGNOSIS — Z8616 Personal history of COVID-19: Secondary | ICD-10-CM | POA: Insufficient documentation

## 2021-11-30 HISTORY — PX: BROW LIFT: SHX178

## 2021-11-30 SURGERY — BLEPHAROPLASTY
Anesthesia: General | Site: Eye | Laterality: Bilateral

## 2021-11-30 MED ORDER — ERYTHROMYCIN 5 MG/GM OP OINT: TOPICAL_OINTMENT | Freq: Four times a day (QID) | OPHTHALMIC | Status: DC

## 2021-11-30 MED ORDER — PROMETHAZINE HCL 25 MG/ML IJ SOLN: 6.2500 mg | INTRAMUSCULAR | Status: DC | PRN

## 2021-11-30 MED ORDER — LIDOCAINE-EPINEPHRINE 2 %-1:100000 IJ SOLN
INTRAMUSCULAR | Status: DC | PRN
  Administered 2021-11-30: 5 mL via OPHTHALMIC

## 2021-11-30 MED ORDER — HYDROMORPHONE HCL 1 MG/ML IJ SOLN: 0.2500 mg | INTRAMUSCULAR | Status: DC | PRN

## 2021-11-30 MED ORDER — LACTATED RINGERS IV SOLN: INTRAVENOUS | Status: DC

## 2021-11-30 MED ORDER — OXYCODONE HCL 5 MG PO TABS: 5.0000 mg | ORAL_TABLET | Freq: Once | ORAL | Status: DC | PRN

## 2021-11-30 MED ORDER — PROPOFOL 10 MG/ML IV BOLUS
INTRAVENOUS | Status: DC | PRN
  Administered 2021-11-30: 50 mg via INTRAVENOUS

## 2021-11-30 MED ORDER — FENTANYL CITRATE (PF) 100 MCG/2ML IJ SOLN
INTRAMUSCULAR | Status: DC | PRN
  Administered 2021-11-30: 25 ug via INTRAVENOUS

## 2021-11-30 MED ORDER — OXYCODONE HCL 5 MG/5ML PO SOLN: 5.0000 mg | Freq: Once | ORAL | Status: DC | PRN

## 2021-11-30 MED ORDER — MIDAZOLAM HCL 2 MG/2ML IJ SOLN
INTRAMUSCULAR | Status: DC | PRN
  Administered 2021-11-30: .5 mg via INTRAVENOUS
  Administered 2021-11-30: 1 mg via INTRAVENOUS
  Administered 2021-11-30: .5 mg via INTRAVENOUS

## 2021-11-30 MED ORDER — TRAMADOL HCL 50 MG PO TABS: ORAL_TABLET | ORAL | 0 refills | Status: DC

## 2021-11-30 MED ORDER — BSS IO SOLN
INTRAOCULAR | Status: DC | PRN
Start: 2021-11-30 — End: 2021-11-30
  Administered 2021-11-30: 15 mL

## 2021-11-30 MED ORDER — PROPOFOL 500 MG/50ML IV EMUL
INTRAVENOUS | Status: DC | PRN
Start: 2021-11-30 — End: 2021-11-30
  Administered 2021-11-30: 50 ug/kg/min via INTRAVENOUS

## 2021-11-30 MED ORDER — ERYTHROMYCIN 5 MG/GM OP OINT
TOPICAL_OINTMENT | OPHTHALMIC | Status: DC | PRN
  Administered 2021-11-30: 1 via OPHTHALMIC

## 2021-11-30 MED ORDER — TETRACAINE HCL 0.5 % OP SOLN
OPHTHALMIC | Status: DC | PRN
  Administered 2021-11-30: 1 [drp] via OPHTHALMIC

## 2021-11-30 MED ORDER — LIDOCAINE HCL (CARDIAC) PF 100 MG/5ML IV SOSY
PREFILLED_SYRINGE | INTRAVENOUS | Status: DC | PRN
  Administered 2021-11-30: 60 mg via INTRAVENOUS

## 2021-11-30 SURGICAL SUPPLY — 24 items
APPLICATOR COTTON TIP WD 3 STR (MISCELLANEOUS) ×2 IMPLANT
BLADE SURG 15 STRL LF DISP TIS (BLADE) ×1 IMPLANT
BLADE SURG 15 STRL SS (BLADE) ×2
CORD BIP STRL DISP 12FT (MISCELLANEOUS) ×2 IMPLANT
GAUZE SPONGE 4X4 12PLY STRL (GAUZE/BANDAGES/DRESSINGS) ×2 IMPLANT
GLOVE SURG UNDER POLY LF SZ7 (GLOVE) ×4 IMPLANT
GOWN STRL REUS W/ TWL LRG LVL3 (GOWN DISPOSABLE) ×1 IMPLANT
GOWN STRL REUS W/TWL LRG LVL3 (GOWN DISPOSABLE) ×2
MARKER SKIN XFINE TIP W/RULER (MISCELLANEOUS) ×2 IMPLANT
NDL FILTER BLUNT 18X1 1/2 (NEEDLE) ×1 IMPLANT
NDL HYPO 30X.5 LL (NEEDLE) ×2 IMPLANT
NEEDLE FILTER BLUNT 18X 1/2SAF (NEEDLE) ×1
NEEDLE FILTER BLUNT 18X1 1/2 (NEEDLE) ×1 IMPLANT
NEEDLE HYPO 30X.5 LL (NEEDLE) ×4 IMPLANT
PACK ENT CUSTOM (PACKS) ×2 IMPLANT
SOL PREP PVP 2OZ (MISCELLANEOUS) ×2
SOLUTION PREP PVP 2OZ (MISCELLANEOUS) ×1 IMPLANT
SPONGE GAUZE 2X2 8PLY STRL LF (GAUZE/BANDAGES/DRESSINGS) ×20 IMPLANT
SUT CHROMIC 5 0 P 3 (SUTURE) ×2 IMPLANT
SUT GUT PLAIN 6-0 1X18 ABS (SUTURE) ×2 IMPLANT
SUT PROLENE 5 0 P 3 (SUTURE) ×1 IMPLANT
SYR 10ML LL (SYRINGE) ×2 IMPLANT
SYR 3ML LL SCALE MARK (SYRINGE) ×2 IMPLANT
WATER STERILE IRR 250ML POUR (IV SOLUTION) ×2 IMPLANT

## 2021-11-30 NOTE — Assessment & Plan Note (Signed)
Very infrequent has resolved mainly with resolution of menses ?

## 2021-11-30 NOTE — Anesthesia Preprocedure Evaluation (Signed)
Anesthesia Evaluation  ?Patient identified by MRN, date of birth, ID band ?Patient awake ? ? ? ?Reviewed: ?Allergy & Precautions, NPO status , Patient's Chart, lab work & pertinent test results, reviewed documented beta blocker date and time  ? ?History of Anesthesia Complications ?Negative for: history of anesthetic complications ? ?Airway ?Mallampati: II ? ?TM Distance: >3 FB ?Neck ROM: Limited ? ? ? Dental ? ?(+) Partial Upper, Partial Lower ?  ?Pulmonary ? ?  ?breath sounds clear to auscultation ? ? ? ? ? ? Cardiovascular ?(-) angina(-) DOE  ?Rhythm:Regular Rate:Normal ? ? ?HLD ?  ?Neuro/Psych ? Headaches, PSYCHIATRIC DISORDERS Anxiety   ? GI/Hepatic ?GERD  Controlled,  ?Endo/Other  ? ? Renal/GU ?  ? ?  ?Musculoskeletal ? ? Abdominal ?  ?Peds ? Hematology ? ?(+) Blood dyscrasia, anemia ,   ?Anesthesia Other Findings ?Breast cancer ? Reproductive/Obstetrics ? ?  ? ? ? ? ? ? ? ? ? ? ? ? ? ?  ?  ? ? ? ? ? ? ? ?Anesthesia Physical ?Anesthesia Plan ? ?ASA: 2 ? ?Anesthesia Plan: General  ? ?Post-op Pain Management:   ? ?Induction: Intravenous ? ?PONV Risk Score and Plan: Propofol infusion, TIVA and Treatment may vary due to age or medical condition ? ?Airway Management Planned: Natural Airway and Nasal Cannula ? ?Additional Equipment:  ? ?Intra-op Plan:  ? ?Post-operative Plan:  ? ?Informed Consent: I have reviewed the patients History and Physical, chart, labs and discussed the procedure including the risks, benefits and alternatives for the proposed anesthesia with the patient or authorized representative who has indicated his/her understanding and acceptance.  ? ? ? ? ? ?Plan Discussed with: CRNA and Anesthesiologist ? ?Anesthesia Plan Comments:   ? ? ? ? ? ? ?Anesthesia Quick Evaluation ? ?

## 2021-11-30 NOTE — Transfer of Care (Signed)
Immediate Anesthesia Transfer of Care Note ? ?Patient: [name] ? ?Procedure(s) Performed: BROW PTOSIS REPAIR BILATERAL (Bilateral: Eye) ? ?Patient Location: PACU ? ?Anesthesia Type: General ? ?Level of Consciousness: awake, alert  and patient cooperative ? ?Airway and Oxygen Therapy: Patient Spontanous Breathing and Patient connected to supplemental oxygen ? ?Post-op Assessment: Post-op Vital signs reviewed, Patient's Cardiovascular Status Stable, Respiratory Function Stable, Patent Airway and No signs of Nausea or vomiting ? ?Post-op Vital Signs: Reviewed and stable ? ?Complications: No notable events documented. ? ?

## 2021-11-30 NOTE — Assessment & Plan Note (Signed)
Advised patient that we need at home blood pressure monitoring.[name] ?Pt advised of the following:  ?Monitor blood pressure periodically and/or when you feel symptomatic. Goal is <130/90 on average. Ensure that you have rested for 30 minutes prior to checking your blood pressure. Record your readings and bring them to your next visit if necessary.work on a low sodium diet. ? ?

## 2021-11-30 NOTE — Assessment & Plan Note (Signed)
Patient to restart red yeast rice omega-3 fish oils as well as prescription sent for Zetia 10 mg to take once daily.  Referral to cardiologist in place for resistant hyperlipidemia with statin intolerance.  May have to consider Repatha.  Work on low-cholesterol diet and exercise as tolerated ?

## 2021-11-30 NOTE — Anesthesia Procedure Notes (Signed)
Procedure Name: [place] ?Date/Time: 11/30/2021 10:45 AM ?Performed by: Dionne Bucy, CRNA ?Pre-anesthesia Checklist: Patient identified, Emergency Drugs available, Suction available, Patient being monitored and Timeout performed ?Patient Re-evaluated:Patient Re-evaluated prior to induction ?Oxygen Delivery Method: Nasal cannula ?Placement Confirmation: positive ETCO2 ? ? ? ? ?

## 2021-11-30 NOTE — Interval H&P Note (Signed)
History and Physical Interval Note: ? ?11/30/2021 ?10:33 AM ? 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  The various methods of treatment have been discussed with the patient and family. After consideration of risks, benefits and other options for treatment, the patient has consented to  Procedure(s): ?BROW PTOSIS REPAIR BILATERAL (Bilateral) as a surgical intervention.  The patient's history has been reviewed, patient examined, no change in status, stable for surgery.  I have reviewed the patient's chart and labs.  Questions were answered to the patient's satisfaction.   ? ? ?Vickki Muff Calea Hribar M ? ? ?

## 2021-11-30 NOTE — Op Note (Signed)
Preoperative Diagnosis:  ? ?Visually significant bilateral brow ptosis.  ? ?Postoperative Diagnosis: Same.  ? ?Procedure(s) Performed:  ? ?bilateral  Direct brow lift to improve vision.  ? ?Surgeon: Philis Pique. Vickki Muff, M.D.  ? ?Assistants: None  ? ?Anesthesia: MAC ? ?Specimens: None. ? ?Estimated Blood Loss: Minimal. ? ?Complications: None. ? ?Operative Findings: None Dictated ? ?PROCEDURE:  ? ?Allergies were reviewed and the patient is allergic to statins..  ? ?After the risks, benefits, complications and alternatives were discussed with the patient, appropriate informed consent was obtained. While seated in an upright position and looking in primary gaze, the amount of supra-brow skin to be removed was measured and marked in an elliptical pattern. The patient was then brought to the operating suite and reclined supine.  Timeout was conducted and the patient was sedated. Local anesthetic consisting of a 50-50 mixture of 2% lidocaine with epinephrine and 0.75% bupivacaine with added Hylenex was injected subcutaneously to the both  brow region(s) and down to the periosteum. After adequate local was instilled, the patient was prepped and draped in the usual sterile fashion for eyelid surgery.  ? ?Attention was turned to the right brow region. A #15 blade was used to create a bevelled incision along the premarked incision line. A skin and subcutaneous tissue flap was then excised and hemostasis was obtained with bipolar cautery. The deep tissues were reapproximated with interrupted vertical 5-0 chromic sutures. The skin margin was reapproximated with a running locking 5-0 Prolene suture. Attention was then turned to the opposite brow region where the same procedure was performed in the same manner.   ? ?The patient tolerated the procedure well. Erythromycin ophthalmic ointment was applied to the incision site(s) followed by ice packs.The patient was taken to the recovery area where she recovered without  difficulty. ? ?Post-Op Plan/Instructions:  ?The patient was instructed to use ice packs frequently for the next 48 hours.  she was instructed to use Erythromycin ophthalmic ophthalmic ointment on the eyelid incisions and over-the-counter antibiotic ointment on the brow sutures 4 times a day for the next 12 to 14 days. she was given a prescription for Tramadol (or similar) for pain control should Tylenol not be effective. she was asked to to follow up in 10-12 days time for suture removal or sooner as needed for problems. ? ? ?Odysseus Cada M. Vickki Muff, M.D. ?Ophthalmology  ?

## 2021-11-30 NOTE — Assessment & Plan Note (Signed)
Patient has tried multiple statins with symptoms every time.  Referral placed for hyperlipidemia clinic however patient not to be seen until August.  Patient with suspected familial hypercholesterolemia, patient to work on low-cholesterol diet and exercise.  Recent calcium score reviewed from 2019 and score was 0.  Referral is to be placed with cardiologist as she has seen him in the past, Dr. Saunders Revel.  Patient may need to start Fortine possibly. ?

## 2021-11-30 NOTE — H&P (Signed)
?[place]: [employer] ? ?Primary Care Physician:  [name], FNP ?Ophthalmologist: Dr. Philis Pique. Vickki Muff, M.D. ? ?Pre-Procedure History & Physical: ?HPI:  [name] is a 66 y.o. female here for periocular surgery. ?  ?Past Medical History:  ?Diagnosis Date  ? Anemia   ? Anxiety   ? Blood transfusion without reported diagnosis   ? Breast cancer ([place]) 02/27/2007  ? Right  ? COVID-19 08/30/2020  ? GERD (gastroesophageal reflux disease)   ? Hypercholesterolemia   ? Migraine   ? Hx of in past  ? Personal history of radiation therapy 2008  ? Right  ? Wears dentures   ? partial upper and lower  ? ? ?Past Surgical History:  ?Procedure Laterality Date  ? ABDOMINAL HYSTERECTOMY    ? BREAST BIOPSY Right 02/27/2007  ? BREAST LUMPECTOMY Right 04/02/2007  ? BREAST SURGERY  01/14/2013  ? breast reduction left side  ? BROW LIFT Bilateral 06/17/2017  ? Procedure: BLEPHAROPLASTY UPPER EYELID WITH EXCESS SKIN;  Surgeon: Karle Starch, MD;  Location: [facility];  Service: Ophthalmology;  Laterality: Bilateral;  ? CESAREAN SECTION    ? two times  ? COLONOSCOPY    ? FOOT SURGERY Right   ? PTOSIS REPAIR Bilateral 06/17/2017  ? Procedure: PTOSIS REPAIR RESECT EX;  Surgeon: Karle Starch, MD;  Location: [facility];  Service: Ophthalmology;  Laterality: Bilateral;  ? REDUCTION MAMMAPLASTY Left   ? SHOULDER ARTHROSCOPY WITH ROTATOR CUFF REPAIR AND OPEN BICEPS TENODESIS Right 02/01/2019  ? Procedure: RIGHT SHOULDER ARTHROSCOPY, DEBRIDEMENT, BICEPS TENODESIS, MINI ROTATOR CUFF TEAR REPAIR;  Surgeon: Meredith Pel, MD;  Location: [facility];  Service: Orthopedics;  Laterality: Right;  ? ? ?Prior to Admission medications   ?Medication Sig Start Date End Date Taking? Authorizing Provider  ?escitalopram (LEXAPRO) 20 MG tablet TAKE 1 TABLET BY MOUTH EVERY DAY 03/28/21  Yes Lesleigh Noe, MD  ?ezetimibe (ZETIA) 10 MG tablet Take 1 tablet (10 mg total) by mouth daily. 11/29/21  Yes [name], FNP  ?fluticasone (FLONASE) 50 MCG/ACT nasal spray Place 2 sprays into both nostrils daily. 06/25/18  Yes Lucille Passy, MD  ?ibuprofen (ADVIL) 800 MG tablet Take 1 tablet (800 mg total) by mouth every 8 (eight) hours as needed. 03/30/20  Yes Lesleigh Noe, MD  ?omeprazole (PRILOSEC) 20 MG capsule TAKE 1 CAPSULE BY MOUTH EVERY DAY ?Patient taking differently: daily as needed. 06/25/21  Yes Venia Carbon, MD  ?cetirizine-pseudoephedrine (ZYRTEC-D) 5-120 MG tablet Take 1 tablet by mouth as needed for allergies.    [provider]  ?Multiple Vitamin (MULTIVITAMIN) tablet Take 1 tablet by mouth daily.    [provider]  ? ? ?Allergies as of 08/23/2021 - Review Complete 03/27/2021  ?Allergen Reaction Noted  ? Statins Other (See Comments) 05/15/2015  ? ? ?Family History  ?Problem Relation Age of Onset  ? Thyroid disease Mother   ? Hypertension Mother   ? Hyperlipidemia Mother   ? Alzheimer's disease Mother   ? Heart failure Mother   ? Esophageal cancer Father   ? Hyperlipidemia Father   ? Heart murmur Father   ? Colon polyps Father   ? COPD Father   ? Diabetes Sister   ? Alzheimer's disease Maternal Aunt   ? Alzheimer's disease Maternal Uncle   ? Diabetes Paternal Uncle   ? Alzheimer's disease Maternal Grandmother   ? Diabetes Paternal Grandmother   ? Breast cancer Sister   ?  Irritable bowel syndrome Sister   ? Ulcerative colitis Sister   ? Rheum arthritis Sister   ? Breast cancer Cousin   ? Breast cancer Cousin   ? Colon cancer Neg Hx   ? ? ?Social History  ? ?Socioeconomic History  ? Marital status: Married  ?  Spouse name: [name]  ? Number of children: 2  ? Years of education: high school  ? Highest education level: Not on file  ?Occupational History  ? Not on file  ?Tobacco Use  ? Smoking status: Never  ? Smokeless tobacco: Never  ?Vaping Use  ? Vaping Use: Never used  ?Substance and Sexual Activity  ? Alcohol use: No  ?  Alcohol/week: 0.0 standard drinks  ? Drug use: No  ? Sexual  activity: Yes  ?  Partners: Male  ?  Birth control/protection: Surgical  ?Other Topics Concern  ? Not on file  ?Social History Narrative  ? 01/25/20  ? From: the area  ? Living: with husband, [name] ([id number]) and her mother  ? 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   ? Family: 2 daughters - [name] and [name] - 3 grandchildren  ?   ? Enjoys: boating, walking, beach, fishing, outdoor activity  ?   ? Exercise: keeps busy moving around the house, irregular  ? Diet: chicken, some read meat, veggies, salads - trying to limit sugar  ?   ? Safety  ? Seat belts: Yes   ? Guns: Yes  and secure  ? Safe in relationships: Yes   ? ?Social Determinants of Health  ? ?Financial Resource Strain: Not on file  ?Food Insecurity: Not on file  ?Transportation Needs: Not on file  ?Physical Activity: Not on file  ?Stress: Not on file  ?Social Connections: Not on file  ?Intimate Partner Violence: Not on file  ? ? ?Review of Systems: ?See HPI, otherwise negative ROS ? ?Physical Exam: ?BP 139/86   Pulse 72   Temp 97.9 ?F (36.6 ?C) (Temporal)   Resp (!) 22   Ht 5' 4.5" (1.638 m)   Wt 78.5 kg   SpO2 95%   BMI 29.24 kg/m?  ?General:   Alert and cooperative in NAD ?Head:  Normocephalic and atraumatic. ?Respiratory:  Normal work of breathing. ? ?Impression/Plan: ?[name] is here for periocular surgery. ? ?Risks, benefits, limitations, and alternatives regarding surgery have been reviewed with the patient.  Questions have been answered.  All parties agreeable. ? ? ?Karle Starch, MD  11/30/2021, 10:33 AM  ?

## 2021-11-30 NOTE — Anesthesia Postprocedure Evaluation (Signed)
Anesthesia Post Note ? ?Patient: [name] ? ?Procedure(s) Performed: BROW PTOSIS REPAIR BILATERAL (Bilateral: Eye) ? ? ?  ?Patient location during evaluation: PACU ?Anesthesia Type: General ?Level of consciousness: awake and alert ?Pain management: pain level controlled ?Vital Signs Assessment: post-procedure vital signs reviewed and stable ?Respiratory status: spontaneous breathing, nonlabored ventilation, respiratory function stable and patient connected to nasal cannula oxygen ?Cardiovascular status: blood pressure returned to baseline and stable ?Postop Assessment: no apparent nausea or vomiting ?Anesthetic complications: no ? ? ?No notable events documented. ? ?Isobel Eisenhuth A  Danyeal Akens ? ? ? ? ? ?

## 2021-11-30 NOTE — Assessment & Plan Note (Signed)
Placed order for B12 we will order this at next visit suspect that this may have been a included in her shakes that she was drinking back in January.  But we will see once resulted.  If still abnormally high as patient is not currently supplementing we will take further. ? ?

## 2021-12-03 ENCOUNTER — Encounter: Payer: Self-pay | Admitting: Ophthalmology

## 2021-12-04 ENCOUNTER — Encounter: Payer: Self-pay | Admitting: Gastroenterology

## 2021-12-17 ENCOUNTER — Other Ambulatory Visit: Payer: 59

## 2021-12-24 ENCOUNTER — Ambulatory Visit: Payer: 59

## 2021-12-24 ENCOUNTER — Ambulatory Visit
Admission: RE | Admit: 2021-12-24 | Discharge: 2021-12-24 | Disposition: A | Discharge status: Home / Self Care | Payer: PPO | Source: Ambulatory Visit | Attending: Family Medicine | Admitting: Family Medicine

## 2021-12-24 DIAGNOSIS — R928 Other abnormal and inconclusive findings on diagnostic imaging of breast: Secondary | ICD-10-CM

## 2021-12-24 DIAGNOSIS — R922 Inconclusive mammogram: Secondary | ICD-10-CM | POA: Diagnosis not present

## 2021-12-27 ENCOUNTER — Encounter: Payer: Self-pay | Admitting: Family

## 2021-12-27 ENCOUNTER — Ambulatory Visit (INDEPENDENT_AMBULATORY_CARE_PROVIDER_SITE_OTHER): Payer: PPO | Admitting: Family

## 2021-12-27 VITALS — BP 138/84 | HR 68 | Temp 99.0°F | Resp 16 | Ht 64.5 in | Wt 175.4 lb

## 2021-12-27 DIAGNOSIS — I10 Essential (primary) hypertension: Secondary | ICD-10-CM | POA: Diagnosis not present

## 2021-12-27 DIAGNOSIS — E782 Mixed hyperlipidemia: Secondary | ICD-10-CM | POA: Diagnosis not present

## 2021-12-27 DIAGNOSIS — M791 Myalgia, unspecified site: Secondary | ICD-10-CM

## 2021-12-27 DIAGNOSIS — T466X5A Adverse effect of antihyperlipidemic and antiarteriosclerotic drugs, initial encounter: Secondary | ICD-10-CM

## 2021-12-27 DIAGNOSIS — R7989 Other specified abnormal findings of blood chemistry: Secondary | ICD-10-CM

## 2021-12-27 MED ORDER — LOSARTAN POTASSIUM 50 MG PO TABS: 50.0000 mg | ORAL_TABLET | Freq: Every day | ORAL | 0 refills | Status: DC

## 2021-12-27 NOTE — Patient Instructions (Signed)
Start losartan 50 mg once daily.  ?Start monitoring your blood pressure daily, around the same time of day, for the next 2-3 weeks.  Ensure that you have rested for 30 minutes prior to checking your blood pressure. Record your readings and bring them to your next visit.  ? ?Due to recent changes in healthcare laws, you may see results of your imaging and/or laboratory studies on MyChart before I have had a chance to review them.  I understand that in some cases there may be results that are confusing or concerning to you. Please understand that not all results are received at the same time and often I may need to interpret multiple results in order to provide you with the best plan of care or course of treatment. Therefore, I ask that you please give me 2 business days to thoroughly review all your results before contacting my office for clarification. Should we see a critical lab result, you will be contacted sooner.  ? ?It was a pleasure seeing you today! Please do not hesitate to reach out with any questions and or concerns. ? ?Regards,  ? ?Tawonna Esquer ?FNP-C ? ?

## 2021-12-27 NOTE — Progress Notes (Signed)
Noted  

## 2021-12-27 NOTE — Assessment & Plan Note (Signed)
Start losartan 50 mg  ?Pt advised of the following:  ?Continue medication as prescribed. Monitor blood pressure periodically and/or when you feel symptomatic. Goal is <130/90 on average. Ensure that you have rested for 30 minutes prior to checking your blood pressure. Record your readings and bring them to your next visit if necessary.work on a low sodium diet. ? ?

## 2021-12-27 NOTE — Progress Notes (Signed)
? ?Established Patient Office Visit ? ?Subjective:  ?Patient ID: [name], female    DOB: March 28, 1956  Age: 66 y.o. MRN: [id number] ? ?CC:  ?Chief Complaint  ?Patient presents with  ? Hyperlipidemia  ? ? ?HPI ?[name] is here today for follow up.  ?Pt is without acute concerns. ? ?HLD: started on zetia, and states makes her feel very tired and causes constipation. In the last few days she changed it to taking at night time and feels slightly better as of today. Also has started fish oils as well as red yeast rice.  ? Has appt with cardiologist Dr. Saunders Revel on 01/11/22 ? Started the new regimen about three weeks ago. ?Has not experienced any myalgias as of yet with red yeast rice.   ?  ?Lab Results  ?Component Value Date  ? CHOL 317 (H) 10/01/2021  ? HDL 58.00 10/01/2021  ? LDLCALC 223 (H) 10/01/2021  ? LDLDIRECT 209.0 06/04/2019  ? TRIG 179.0 (H) 10/01/2021  ? CHOLHDL 5 10/01/2021  ? ? ?HTN: average seems to be around 138/85 or so.  ?Highest she has seen was 149/80 but only occasionally and as low as 121/79 ? ?Increased b12 lab work in January , not taking recent vitamin for this.  ? ?Anxiety: increased stress on lexapro 20 mg once daily, is going to try to start walking more often to see if she can help manage stress levels.  ? ?Past Medical History:  ?Diagnosis Date  ? Anemia   ? Anxiety   ? Blood transfusion without reported diagnosis   ? Breast cancer ([place]) 02/27/2007  ? Right  ? COVID-19 08/30/2020  ? GERD (gastroesophageal reflux disease)   ? Hypercholesterolemia   ? Migraine   ? Hx of in past  ? Personal history of radiation therapy 2008  ? Right  ? Wears dentures   ? partial upper and lower  ? ? ?Past Surgical History:  ?Procedure Laterality Date  ? ABDOMINAL HYSTERECTOMY    ? BREAST BIOPSY Right 02/27/2007  ? BREAST LUMPECTOMY Right 04/02/2007  ? BREAST SURGERY  01/14/2013  ? breast reduction left side  ? BROW LIFT Bilateral 06/17/2017  ? Procedure: BLEPHAROPLASTY UPPER EYELID WITH EXCESS SKIN;   Surgeon: Karle Starch, MD;  Location: [facility];  Service: Ophthalmology;  Laterality: Bilateral;  ? BROW LIFT Bilateral 11/30/2021  ? Procedure: BROW PTOSIS REPAIR BILATERAL;  Surgeon: Karle Starch, MD;  Location: [facility];  Service: Ophthalmology;  Laterality: Bilateral;  ? CESAREAN SECTION    ? two times  ? COLONOSCOPY    ? FOOT SURGERY Right   ? PTOSIS REPAIR Bilateral 06/17/2017  ? Procedure: PTOSIS REPAIR RESECT EX;  Surgeon: Karle Starch, MD;  Location: [facility];  Service: Ophthalmology;  Laterality: Bilateral;  ? REDUCTION MAMMAPLASTY Left   ? SHOULDER ARTHROSCOPY WITH ROTATOR CUFF REPAIR AND OPEN BICEPS TENODESIS Right 02/01/2019  ? Procedure: RIGHT SHOULDER ARTHROSCOPY, DEBRIDEMENT, BICEPS TENODESIS, MINI ROTATOR CUFF TEAR REPAIR;  Surgeon: Meredith Pel, MD;  Location: [facility];  Service: Orthopedics;  Laterality: Right;  ? ? ?Family History  ?Problem Relation Age of Onset  ? Thyroid disease Mother   ? Hypertension Mother   ? Hyperlipidemia Mother   ? Alzheimer's disease Mother   ? Heart failure Mother   ? Esophageal cancer Father   ? Hyperlipidemia Father   ? Heart murmur Father   ? Colon polyps Father   ? COPD Father   ?  Diabetes Sister   ? Alzheimer's disease Maternal Aunt   ? Alzheimer's disease Maternal Uncle   ? Diabetes Paternal Uncle   ? Alzheimer's disease Maternal Grandmother   ? Diabetes Paternal Grandmother   ? Breast cancer Sister   ? Irritable bowel syndrome Sister   ? Ulcerative colitis Sister   ? Rheum arthritis Sister   ? Breast cancer Cousin   ? Breast cancer Cousin   ? Colon cancer Neg Hx   ? ? ?Social History  ? ?Socioeconomic History  ? Marital status: Married  ?  Spouse name: [name]  ? Number of children: 2  ? Years of education: high school  ? Highest education level: Not on file  ?Occupational History  ? Not on file  ?Tobacco Use  ? Smoking status: Never  ? Smokeless tobacco: Never  ?Vaping Use  ? Vaping Use: Never used   ?Substance and Sexual Activity  ? Alcohol use: No  ?  Alcohol/week: 0.0 standard drinks  ? Drug use: No  ? Sexual activity: Yes  ?  Partners: Male  ?  Birth control/protection: Surgical  ?Other Topics Concern  ? Not on file  ?Social History Narrative  ? 01/25/20  ? From: [place] area  ? Living: with husband, [name] ([id number]) and her mother  ? 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   ? Family: 2 daughters - [name] and [name] - 3 grandchildren  ?   ? Enjoys: boating, walking, beach, fishing, outdoor activity  ?   ? Exercise: keeps busy moving around the house, irregular  ? Diet: chicken, some read meat, veggies, salads - trying to limit sugar  ?   ? Safety  ? Seat belts: Yes   ? Guns: Yes  and secure  ? Safe in relationships: Yes   ? ?Social Determinants of Health  ? ?Financial Resource Strain: Not on file  ?Food Insecurity: Not on file  ?Transportation Needs: Not on file  ?Physical Activity: Not on file  ?Stress: Not on file  ?Social Connections: Not on file  ?Intimate Partner Violence: Not on file  ? ? ?Outpatient Medications Prior to Visit  ?Medication Sig Dispense Refill  ? azelastine (ASTELIN) 0.1 % nasal spray SPRAY 1-2 SPRAYS IN EACH NOSTRIL TWICE DAILY.    ? escitalopram (LEXAPRO) 20 MG tablet TAKE 1 TABLET BY MOUTH EVERY DAY 90 tablet 2  ? ezetimibe (ZETIA) 10 MG tablet Take 1 tablet (10 mg total) by mouth daily. 90 tablet 3  ? fluticasone (FLONASE) 50 MCG/ACT nasal spray Place 2 sprays into both nostrils daily. 16 g 6  ? ibuprofen (ADVIL) 800 MG tablet Take 1 tablet (800 mg total) by mouth every 8 (eight) hours as needed. 90 tablet 0  ? Multiple Vitamin (MULTIVITAMIN) tablet Take 1 tablet by mouth daily.    ? omeprazole (PRILOSEC) 20 MG capsule TAKE 1 CAPSULE BY MOUTH EVERY DAY (Patient taking differently: daily as needed.) 90 capsule 1  ? traMADol (ULTRAM) 50 MG tablet Take 1 every 4-6 hours as needed for pain not controlled by Tylenol 6 tablet 0  ?  cetirizine-pseudoephedrine (ZYRTEC-D) 5-120 MG tablet Take 1 tablet by mouth as needed for allergies. (Patient not taking: Reported on 12/27/2021)    ? ?No facility-administered medications prior to visit.  ? ? ?Allergies  ?Allergen Reactions  ? Statins Other (See Comments)  ?  Muscle aches  ? ? ?ROS ?Review of Systems  ?Constitutional:  Positive for fatigue (improving  so far with changing zetia to nighttime). Negative for chills and fever.  ?Respiratory:  Negative for cough and shortness of breath.   ?Cardiovascular:  Negative for chest pain and leg swelling.  ?Gastrointestinal:  Positive for constipation (at times). Negative for diarrhea and nausea.  ?Genitourinary:  Negative for difficulty urinating.  ?Musculoskeletal:  Negative for myalgias.  ?Psychiatric/Behavioral:  Negative for agitation and suicidal ideas. The patient is nervous/anxious (higher stress however going to start walking more to help).   ?All other systems reviewed and are negative. ? ? ? ?  ?Objective:  ?  ?Physical Exam ?Vitals reviewed.  ?Constitutional:   ?   General: She is not in acute distress. ?   Appearance: Normal appearance. She is not ill-appearing or toxic-appearing.  ?Cardiovascular:  ?   Rate and Rhythm: Normal rate and regular rhythm.  ?Pulmonary:  ?   Effort: Pulmonary effort is normal.  ?   Breath sounds: Normal breath sounds.  ?Skin: ?   General: Skin is warm.  ?   Capillary Refill: Capillary refill takes less than 2 seconds.  ?Neurological:  ?   General: No focal deficit present.  ?   Mental Status: She is alert and oriented to person, place, and time.  ?Psychiatric:     ?   Mood and Affect: Mood normal.     ?   Behavior: Behavior normal.     ?   Thought Content: Thought content normal.     ?   Judgment: Judgment normal.  ? ? ? ? ?BP 138/84   Pulse 68   Temp 99 ?F (37.2 ?C)   Resp 16   Ht 5' 4.5" (1.638 m)   Wt 175 lb 7 oz (79.6 kg)   SpO2 98%   BMI 29.65 kg/m?  ?Wt Readings from Last 3 Encounters:  ?12/27/21 175 lb 7 oz  (79.6 kg)  ?11/30/21 173 lb (78.5 kg)  ?11/29/21 174 lb (78.9 kg)  ? ? ? ?Health Maintenance Due  ?Topic Date Due  ? Hepatitis C Screening  Never done  ? Zoster Vaccines- Shingrix (1 of 2) Never done  ? Fecal DNA (Cologuard)  06/17/2021  ? DEXA S

## 2021-12-27 NOTE — Assessment & Plan Note (Signed)
Made future order for repeat lab in two months  ?Pt not taking anything for this. ?

## 2021-12-27 NOTE — Assessment & Plan Note (Signed)
Continue zetia 10 mg  ?Continue red yeast rice ?Continue omega three fish oils ?Ordered lipid panel/for future order in two months pt to repeat fasting, pending results. Work on low cholesterol diet and exercise as tolerated ? ?Maintain appt with cardiologist as we need cardiologist input for possible repatha for total cholesterol >300 ?

## 2021-12-28 ENCOUNTER — Other Ambulatory Visit: Payer: Self-pay | Admitting: Family Medicine

## 2021-12-28 ENCOUNTER — Other Ambulatory Visit: Payer: Self-pay | Admitting: Internal Medicine

## 2021-12-28 DIAGNOSIS — K2960 Other gastritis without bleeding: Secondary | ICD-10-CM

## 2022-01-07 ENCOUNTER — Ambulatory Visit (AMBULATORY_SURGERY_CENTER): Payer: PPO | Admitting: *Deleted

## 2022-01-07 ENCOUNTER — Encounter: Payer: Self-pay | Admitting: Family

## 2022-01-07 VITALS — Ht 64.0 in | Wt 173.0 lb

## 2022-01-07 DIAGNOSIS — Z1211 Encounter for screening for malignant neoplasm of colon: Secondary | ICD-10-CM

## 2022-01-07 MED ORDER — PLENVU 140 G PO SOLR: 1.0000 | ORAL | 0 refills | Status: DC

## 2022-01-07 NOTE — Progress Notes (Signed)
No egg or soy allergy known to patient  ?No issues known to pt with past sedation with any surgeries or procedures ?Patient denies ever being told they had issues or difficulty with intubation  ?No FH of Malignant Hyperthermia ?Pt is not on diet pills ?Pt is not on  home 02  ?Pt is not on blood thinners  ?Pt denies issues with constipation  ?No A fib or A flutter ? ?Plenvu Coupon to pt in PV today , Code to Pharmacy and  NO PA's for preps discussed with pt In PV today  ?Discussed with pt there will be an out-of-pocket cost for prep and that varies from $0 to 70 +  dollars - pt verbalized understanding  ?Pt instructed to use Singlecare.com or GoodRx for a price reduction on prep  ? ?PV completed over the phone. Pt verified name, DOB, address and insurance during PV today.  ? ?Pt encouraged to call with questions or issues.  ?If pt has My chart, procedure instructions sent via My Chart  ?Insurance confirmed with pt at [facility] today   ?

## 2022-01-08 DIAGNOSIS — H2513 Age-related nuclear cataract, bilateral: Secondary | ICD-10-CM | POA: Diagnosis not present

## 2022-01-11 ENCOUNTER — Ambulatory Visit: Payer: PPO | Admitting: Internal Medicine

## 2022-01-11 ENCOUNTER — Other Ambulatory Visit
Admission: RE | Admit: 2022-01-11 | Discharge: 2022-01-11 | Disposition: A | Discharge status: Home / Self Care | Payer: PPO | Attending: Internal Medicine | Admitting: Internal Medicine

## 2022-01-11 ENCOUNTER — Encounter: Payer: Self-pay | Admitting: Internal Medicine

## 2022-01-11 VITALS — BP 140/80 | HR 84 | Ht 65.0 in | Wt 173.0 lb

## 2022-01-11 DIAGNOSIS — E7849 Other hyperlipidemia: Secondary | ICD-10-CM | POA: Diagnosis not present

## 2022-01-11 DIAGNOSIS — I700 Atherosclerosis of aorta: Secondary | ICD-10-CM | POA: Diagnosis not present

## 2022-01-11 DIAGNOSIS — E7801 Familial hypercholesterolemia: Secondary | ICD-10-CM | POA: Diagnosis not present

## 2022-01-11 DIAGNOSIS — E785 Hyperlipidemia, unspecified: Secondary | ICD-10-CM | POA: Insufficient documentation

## 2022-01-11 DIAGNOSIS — I10 Essential (primary) hypertension: Secondary | ICD-10-CM | POA: Diagnosis not present

## 2022-01-11 DIAGNOSIS — Z789 Other specified health status: Secondary | ICD-10-CM

## 2022-01-11 DIAGNOSIS — R0609 Other forms of dyspnea: Secondary | ICD-10-CM

## 2022-01-11 LAB — COMPREHENSIVE METABOLIC PANEL
ALT: 18 U/L (ref 0–44)
AST: 22 U/L (ref 15–41)
Albumin: 4 g/dL (ref 3.5–5.0)
Alkaline Phosphatase: 67 U/L (ref 38–126)
Anion gap: 9 (ref 5–15)
BUN: 16 mg/dL (ref 8–23)
CO2: 28 mmol/L (ref 22–32)
Calcium: 9 mg/dL (ref 8.9–10.3)
Chloride: 101 mmol/L (ref 98–111)
Creatinine, Ser: 0.72 mg/dL (ref 0.44–1.00)
GFR, Estimated: >60 mL/min (ref >60)
Glucose, Bld: 100 mg/dL — ABNORMAL HIGH (ref 70–99)
Potassium: 4.1 mmol/L (ref 3.5–5.1)
Sodium: 138 mmol/L (ref 135–145)
Total Bilirubin: 0.5 mg/dL (ref 0.3–1.2)
Total Protein: 7.5 g/dL (ref 6.5–8.1)

## 2022-01-11 LAB — LIPID PANEL
Cholesterol: 241 mg/dL — ABNORMAL HIGH (ref 0–200)
HDL: 61 mg/dL (ref >40)
LDL Cholesterol: 153 mg/dL — ABNORMAL HIGH (ref 0–99)
Total CHOL/HDL Ratio: 4 RATIO
Triglycerides: 134 mg/dL (ref <150)
VLDL: 27 mg/dL (ref 0–40)

## 2022-01-11 MED ORDER — REPATHA SURECLICK 140 MG/ML ~~LOC~~ SOAJ: 1.0000 mL | SUBCUTANEOUS | 3 refills | Status: DC

## 2022-01-11 NOTE — Progress Notes (Signed)
? ?New Outpatient Visit ?Date: 01/11/2022 ? ?Referring Provider: ?[name], FNP ?[address] ?[address] ?[place],  [place] [address] ? ?Chief Complaint: Hyperlipidemia and aortic atherosclerosis ? ?HPI:  [name] is a 66 y.o. female who is being seen today for the evaluation of hyperlipidemia and statin intolerance at the request of [name]. She has a history of hypertension, hyperlipidemia, breast cancer, GERD, and migraines.  I previously saw her in our office for management of hyperlipidemia, though she has been lost to follow-up since 10/2018.  At that time, we agreed to a trial of co-Q10 with low-dose rosuvastatin.  [name] did not tolerate this and has subsequently been placed on ezetimibe and red yeast rice at the direction of her PCP.  This is also causing symptoms, including fatigue and constipation.  [name] also notes that she was recently placed on losartan by her PCP due to elevated blood pressure.  This worsened her fatigue and generalized malaise even further, prompting her to stop the medicine after about 10 days. ? ?From a heart standpoint, [name] has been feeling fairly well, denying chest pain, palpitations, lightheadedness, and edema.  She notes occasional dyspnea when climbing stairs, which began around the time that ezetimibe was added to her regimen.  She wonders if this medication could be causing her shortness of breath.  She is able to walk and exercise without any dyspnea.  She has not had any orthopnea.  She notes that a CT of the abdomen and pelvis performed earlier this year for evaluation of abdominal complaints made note of atherosclerosis in the abdominal aorta. ? ?-------------------------------------------------------------------------------------------------- ? ?Cardiovascular History & Procedures: ?Cardiovascular Problems: ?Hyperlipidemia ?Aortic atherosclerosis ?  ?Risk Factors: ?Hyperlipidemia ?  ?Cath/PCI: ?None ?  ?CV Surgery: ?None ?  ?EP Procedures and  Devices: ?None ?  ?Non-Invasive Evaluation(s): ?Coronary calcium score (07/27/2018): CAC 0 (low risk).  No incidental extracardiac findings in the chest. ? ?Recent CV Pertinent Labs: ?Lab Results  ?Component Value Date  ? CHOL 317 (H) 10/01/2021  ? HDL 58.00 10/01/2021  ? LDLCALC 223 (H) 10/01/2021  ? LDLDIRECT 209.0 06/04/2019  ? TRIG 179.0 (H) 10/01/2021  ? CHOLHDL 5 10/01/2021  ? K 4.2 10/01/2021  ? K 3.9 10/18/2009  ? BUN 18 10/01/2021  ? BUN 12 10/18/2009  ? CREATININE 0.69 10/01/2021  ? CREATININE 0.7 10/18/2009  ? ? ?-------------------------------------------------------------------------------------------------- ? ?Past Medical History:  ?Diagnosis Date  ? Anemia   ? Anxiety   ? Blood transfusion without reported diagnosis   ? Breast cancer ([facility]) 02/27/2007  ? Right  ? COVID-19 08/30/2020  ? GERD (gastroesophageal reflux disease)   ? Hypercholesterolemia   ? Hypertension   ? Migraine   ? Hx of in past  ? Personal history of radiation therapy 2008  ? Right  ? Wears dentures   ? partial upper and lower  ? ? ?Past Surgical History:  ?Procedure Laterality Date  ? ABDOMINAL HYSTERECTOMY    ? BREAST BIOPSY Right 02/27/2007  ? BREAST LUMPECTOMY Right 04/02/2007  ? BREAST SURGERY  01/14/2013  ? breast reduction left side  ? BROW LIFT Bilateral 06/17/2017  ? Procedure: BLEPHAROPLASTY UPPER EYELID WITH EXCESS SKIN;  Surgeon: Karle Starch, MD;  Location: [facility];  Service: Ophthalmology;  Laterality: Bilateral;  ? BROW LIFT Bilateral 11/30/2021  ? Procedure: BROW PTOSIS REPAIR BILATERAL;  Surgeon: Karle Starch, MD;  Location: [facility];  Service: Ophthalmology;  Laterality: Bilateral;  ? CESAREAN SECTION    ?  two times  ? COLONOSCOPY    ? FOOT SURGERY Right   ? PTOSIS REPAIR Bilateral 06/17/2017  ? Procedure: PTOSIS REPAIR RESECT EX;  Surgeon: Karle Starch, MD;  Location: [facility];  Service: Ophthalmology;  Laterality: Bilateral;  ? REDUCTION MAMMAPLASTY Left   ? SHOULDER ARTHROSCOPY  WITH ROTATOR CUFF REPAIR AND OPEN BICEPS TENODESIS Right 02/01/2019  ? Procedure: RIGHT SHOULDER ARTHROSCOPY, DEBRIDEMENT, BICEPS TENODESIS, MINI ROTATOR CUFF TEAR REPAIR;  Surgeon: Meredith Pel, MD;  Location: [facility];  Service: Orthopedics;  Laterality: Right;  ? ? ?Current Meds  ?Medication Sig  ? azelastine (ASTELIN) 0.1 % nasal spray SPRAY 1-2 SPRAYS IN EACH NOSTRIL TWICE DAILY.  ? escitalopram (LEXAPRO) 20 MG tablet TAKE 1 TABLET BY MOUTH EVERY DAY  ? ezetimibe (ZETIA) 10 MG tablet Take 1 tablet (10 mg total) by mouth daily.  ? fluticasone (FLONASE) 50 MCG/ACT nasal spray Place 2 sprays into both nostrils daily.  ? ibuprofen (ADVIL) 800 MG tablet Take 1 tablet (800 mg total) by mouth every 8 (eight) hours as needed.  ? Multiple Vitamin (MULTIVITAMIN) tablet Take 1 tablet by mouth daily.  ? Omega-3 Fatty Acids (OMEGA 3 500 PO) Take by mouth.  ? omeprazole (PRILOSEC) 20 MG capsule TAKE 1 CAPSULE BY MOUTH EVERY DAY  ? PEG-KCl-NaCl-NaSulf-Na Asc-C (PLENVU) 140 g SOLR Take 1 kit by mouth as directed.  ? Red Yeast Rice Extract (RED YEAST RICE PO) Take by mouth.  ? traMADol (ULTRAM) 50 MG tablet Take 1 every 4-6 hours as needed for pain not controlled by Tylenol  ? ? ?Allergies: Statins ? ?Social History  ? ?Tobacco Use  ? Smoking status: Never  ? Smokeless tobacco: Never  ?Vaping Use  ? Vaping Use: Never used  ?Substance Use Topics  ? Alcohol use: Not Currently  ? Drug use: No  ? ? ?Family History  ?Problem Relation Age of Onset  ? Thyroid disease Mother   ? Hypertension Mother   ? Hyperlipidemia Mother   ? Alzheimer's disease Mother   ? Heart failure Mother   ? Esophageal cancer Father   ? Hyperlipidemia Father   ? Heart murmur Father   ? Colon polyps Father   ? COPD Father   ? Diabetes Sister   ? Breast cancer Sister   ? Irritable bowel syndrome Sister   ? Ulcerative colitis Sister   ? Rheum arthritis Sister   ? Alzheimer's disease Maternal Aunt   ? Alzheimer's disease Maternal Uncle   ?  Diabetes Paternal Uncle   ? Alzheimer's disease Maternal Grandmother   ? Diabetes Paternal Grandmother   ? Breast cancer Cousin   ? Breast cancer Cousin   ? Colon cancer Neg Hx   ? Stomach cancer Neg Hx   ? Rectal cancer Neg Hx   ? ? ?Review of Systems: ?A 12-system review of systems was performed and was negative except as noted in the HPI. ? ?-------------------------------------------------------------------------------------------------- ? ?Physical Exam: ?BP 140/80 (BP Location: Right Arm, Patient Position: Sitting, Cuff Size: Large)   Pulse 84   Ht 5' 5"  (1.651 m)   Wt 173 lb (78.5 kg)   SpO2 97%   BMI 28.79 kg/m?  ? ?General: NAD. ?HEENT: No conjunctival pallor or scleral icterus. Facemask in place. ?Neck: Supple without lymphadenopathy, thyromegaly, JVD, or HJR. No carotid bruit. ?Lungs: Normal work of breathing. Clear to auscultation bilaterally without wheezes or crackles. ?Heart: Regular rate and rhythm without murmurs, rubs, or gallops. Non-displaced PMI. ?Abd: Bowel  sounds present. Soft, NT/ND without hepatosplenomegaly ?Ext: No lower extremity edema. Radial, PT, and DP pulses are 2+ bilaterally ?Skin: Warm and dry without rash. ?Neuro: CNIII-XII intact. Strength and fine-touch sensation intact in upper and lower extremities bilaterally. ?Psych: Normal mood and affect. ? ?EKG: Normal sinus rhythm with nonspecific ST/T changes.  No significant change from prior tracing on 10/21/2018. ? ?Lab Results  ?Component Value Date  ? WBC 4.7 10/01/2021  ? HGB 12.8 10/01/2021  ? HCT 39.6 10/01/2021  ? MCV 85.6 10/01/2021  ? PLT 253.0 10/01/2021  ? ? ?Lab Results  ?Component Value Date  ? NA 139 10/01/2021  ? K 4.2 10/01/2021  ? CL 101 10/01/2021  ? CO2 30 10/01/2021  ? BUN 18 10/01/2021  ? CREATININE 0.69 10/01/2021  ? GLUCOSE 79 10/01/2021  ? ALT 12 10/01/2021  ? ? ?Lab Results  ?Component Value Date  ? CHOL 317 (H) 10/01/2021  ? HDL 58.00 10/01/2021  ? LDLCALC 223 (H) 10/01/2021  ? LDLDIRECT 209.0 06/04/2019   ? TRIG 179.0 (H) 10/01/2021  ? CHOLHDL 5 10/01/2021  ? ? ? ?-------------------------------------------------------------------------------------------------- ? ?ASSESSMENT AND PLAN: ?Female hyperlipidemia and ao

## 2022-01-11 NOTE — Patient Instructions (Addendum)
Medication Instructions:  ? ?Your physician has recommended you make the following change in your medication:  ? ?STOP Losartan ? ?STOP Zetia (ezetimibe) ? ?STOP Red yeast rice ? ?START Repatha 140 mg/ml - 1 injection into the skin every 14 days ? ?*If you need a refill on your cardiac medications before your next appointment, please call your pharmacy* ? ? ?Lab Work: ? ?Today at the medical mall: CMET, Lipid panel ? ?-  Please go to the [facility].  ?-  You will check in at the front desk to the right as you walk into the atrium.  ? ?If you have labs (blood work) drawn today and your tests are completely normal, you will receive your results only by: ?MyChart Message (if you have MyChart) OR ?A paper copy in the mail ?If you have any lab test that is abnormal or we need to change your treatment, we will call you to review the results. ? ? ?Testing/Procedures: ? ?None ordered ? ? ?Follow-Up: ?At [facility], you and your health needs are our priority.  As part of our continuing mission to provide you with exceptional heart care, we have created designated Provider Care Teams.  These Care Teams include your primary Cardiologist (physician) and Advanced Practice Providers (APPs -  Physician Assistants and Nurse Practitioners) who all work together to provide you with the care you need, when you need it. ? ?We recommend signing up for the patient portal called "MyChart".  Sign up information is provided on this After Visit Summary.  MyChart is used to connect with patients for Virtual Visits (Telemedicine).  Patients are able to view lab/test results, encounter notes, upcoming appointments, etc.  Non-urgent messages can be sent to your provider as well.   ?To learn more about what you can do with MyChart, go to [email or web address].   ? ?Your next appointment:   ?2 month(s) ? ?The format for your next appointment:   ?In Person ? ?Provider:   ?You may see Dr. Harrell Gave End or one of the following Advanced  Practice Providers on your designated Care Team:   ?Murray Hodgkins, NP ?Christell Faith, PA-C ?Cadence Kathlen Mody, PA-C  ? ? ?Other Instructions ? ?You have been referred to the [facility]. Our [facility] office will call you to schedule consult. ? ? ?DASH Eating Plan ?DASH stands for Dietary Approaches to Stop Hypertension. The DASH eating plan is a healthy eating plan that has been shown to: ?Reduce high blood pressure (hypertension). ?Reduce your risk for type 2 diabetes, heart disease, and stroke. ?Help with weight loss. ?What are tips for following this plan? ?Reading food labels ?Check food labels for the amount of salt (sodium) per serving. Choose foods with less than 5 percent of the Daily Value of sodium. Generally, foods with less than 300 milligrams (mg) of sodium per serving fit into this eating plan. ?To find whole grains, look for the word "whole" as the first word in the ingredient list. ?Shopping ?Buy products labeled as "low-sodium" or "no salt added." ?Buy fresh foods. Avoid canned foods and pre-made or frozen meals. ?Cooking ?Avoid adding salt when cooking. Use salt-free seasonings or herbs instead of table salt or sea salt. Check with your health care provider or pharmacist before using salt substitutes. ?Do not fry foods. Cook foods using healthy methods such as baking, boiling, grilling, roasting, and broiling instead. ?Cook with heart-healthy oils, such as olive, canola, avocado, soybean, or sunflower oil. ?Meal planning ? ?Eat a balanced diet that  includes: ?4 or more servings of fruits and 4 or more servings of vegetables each day. Try to fill one-half of your plate with fruits and vegetables. ?6-8 servings of whole grains each day. ?Less than 6 oz (170 g) of lean meat, poultry, or fish each day. A 3-oz (85-g) serving of meat is about the same size as a deck of cards. One egg equals 1 oz (28 g). ?2-3 servings of low-fat dairy each day. One serving is 1 cup (237 mL). ?1 serving of nuts,  seeds, or beans 5 times each week. ?2-3 servings of heart-healthy fats. Healthy fats called omega-3 fatty acids are found in foods such as walnuts, flaxseeds, fortified milks, and eggs. These fats are also found in cold-water fish, such as sardines, salmon, and mackerel. ?Limit how much you eat of: ?Canned or prepackaged foods. ?Food that is high in trans fat, such as some fried foods. ?Food that is high in saturated fat, such as fatty meat. ?Desserts and other sweets, sugary drinks, and other foods with added sugar. ?Full-fat dairy products. ?Do not salt foods before eating. ?Do not eat more than 4 egg yolks a week. ?Try to eat at least 2 vegetarian meals a week. ?Eat more home-cooked food and less restaurant, buffet, and fast food. ?Lifestyle ?When eating at a restaurant, ask that your food be prepared with less salt or no salt, if possible. ?If you drink alcohol: ?Limit how much you use to: ?0-1 drink a day for women who are not pregnant. ?0-2 drinks a day for men. ?Be aware of how much alcohol is in your drink. In the [place] one drink equals one 12 oz bottle of beer (355 mL), one 5 oz glass of wine (148 mL), or one 1? oz glass of hard liquor (44 mL). ?General information ?Avoid eating more than 2,300 mg of salt a day. If you have hypertension, you may need to reduce your sodium intake to 1,500 mg a day. ?Work with your health care provider to maintain a healthy body weight or to lose weight. Ask what an ideal weight is for you. ?Get at least 30 minutes of exercise that causes your heart to beat faster (aerobic exercise) most days of the week. Activities may include walking, swimming, or biking. ?Work with your health care provider or dietitian to adjust your eating plan to your individual calorie needs. ?What foods should I eat? ?Fruits ?All fresh, dried, or frozen fruit. Canned fruit in natural juice (without added sugar). ?Vegetables ?Fresh or frozen vegetables (raw, steamed, roasted, or grilled). Low-sodium or  reduced-sodium tomato and vegetable juice. Low-sodium or reduced-sodium tomato sauce and tomato paste. Low-sodium or reduced-sodium canned vegetables. ?Grains ?Whole-grain or whole-wheat bread. Whole-grain or whole-wheat pasta. Brown rice. Modena Morrow. Bulgur. Whole-grain and low-sodium cereals. Pita bread. Low-fat, low-sodium crackers. Whole-wheat flour tortillas. ?Meats and other proteins ?Skinless chicken or [place]. Ground chicken or [place]. Pork with fat trimmed off. Fish and seafood. Egg whites. Dried beans, peas, or lentils. Unsalted nuts, nut butters, and seeds. Unsalted canned beans. Lean cuts of beef with fat trimmed off. Low-sodium, lean precooked or cured meat, such as sausages or meat loaves. ?Dairy ?Low-fat (1%) or fat-free (skim) milk. Reduced-fat, low-fat, or fat-free cheeses. Nonfat, low-sodium ricotta or cottage cheese. Low-fat or nonfat yogurt. Low-fat, low-sodium cheese. ?Fats and oils ?Soft margarine without trans fats. Vegetable oil. Reduced-fat, low-fat, or light mayonnaise and salad dressings (reduced-sodium). Canola, safflower, olive, avocado, soybean, and sunflower oils. Avocado. ?Seasonings and condiments ?Herbs. Spices. Seasoning mixes without  salt. ?Other foods ?Unsalted popcorn and pretzels. Fat-free sweets. ?The items listed above may not be a complete list of foods and beverages you can eat. Contact a dietitian for more information. ?What foods should I avoid? ?Fruits ?Canned fruit in a light or heavy syrup. Fried fruit. Fruit in cream or butter sauce. ?Vegetables ?Creamed or fried vegetables. Vegetables in a cheese sauce. Regular canned vegetables (not low-sodium or reduced-sodium). Regular canned tomato sauce and paste (not low-sodium or reduced-sodium). Regular tomato and vegetable juice (not low-sodium or reduced-sodium). Angie Fava. Olives. ?Grains ?Baked goods made with fat, such as croissants, muffins, or some breads. Dry pasta or rice meal packs. ?Meats and other  proteins ?Fatty cuts of meat. Ribs. Fried meat. Berniece Salines. Bologna, salami, and other precooked or cured meats, such as sausages or meat loaves. Fat from the back of a pig (fatback). Bratwurst. Salted nuts and seeds.

## 2022-01-14 ENCOUNTER — Encounter: Payer: Self-pay | Admitting: Internal Medicine

## 2022-01-15 ENCOUNTER — Telehealth: Payer: Self-pay

## 2022-01-15 ENCOUNTER — Encounter: Payer: Self-pay | Admitting: Gastroenterology

## 2022-01-15 NOTE — Telephone Encounter (Signed)
Prior Auth for Repatha '140mg'$ /mL initiated in [email or web address] KEY: [id number]  ? ?RESPONSE:  Your request has been approved ?[id number] Repatha SureClick '140MG'$ /ML Oliver SOAJ Quantity:2 ?

## 2022-01-23 ENCOUNTER — Ambulatory Visit: Payer: PPO | Admitting: Family Medicine

## 2022-01-23 ENCOUNTER — Encounter: Payer: Self-pay | Admitting: Family Medicine

## 2022-01-23 ENCOUNTER — Ambulatory Visit (INDEPENDENT_AMBULATORY_CARE_PROVIDER_SITE_OTHER): Payer: PPO | Admitting: Family Medicine

## 2022-01-23 ENCOUNTER — Encounter: Payer: PPO | Admitting: Gastroenterology

## 2022-01-23 VITALS — BP 120/72 | HR 78 | Temp 97.9°F | Ht 64.5 in | Wt 177.1 lb

## 2022-01-23 DIAGNOSIS — M7701 Medial epicondylitis, right elbow: Secondary | ICD-10-CM | POA: Diagnosis not present

## 2022-01-23 NOTE — Patient Instructions (Signed)
Voltaren 1% gel, over the counter ?You can apply up to 4 times a day ? ?This can be applied to any joint: knee, wrist, fingers, elbows, shoulders, feet and ankles. ?Can apply to any tendon: tennis elbow, achilles, tendon, rotator cuff or any other tendon. ? ?Minimal is absorbed in the bloodstream: ok with oral anti-inflammatory or a blood thinner. ? ?Cost is about 9 dollars  ?

## 2022-01-23 NOTE — Progress Notes (Signed)
? ? ?Keishia Ground T. Julea Hutto, MD, [facility] ?Therapist, music at [facility] ?[address] ?[place], [address] ? ?Phone: [phone]  FAX: [phone] ? ?[name] - 66 y.o. female  MRN [id number]  Date of Birth: 1955-12-19 ? ?Date: 01/23/2022  PCP: [name], FNP  Referral: [name], FNP ? ?Chief Complaint  ?Patient presents with  ? Elbow Pain  ?  Right  ? ?Subjective:  ? ?[name] presents with lateral elbow pain. ? ?Length of symptoms: 3 weeks ?Hand effected: R ? ?Patient describes a dull ache on the medial elbow. There is some translation in the proximal forearm and in the distal upper arm. It is painful to lift with flexing at the elbow and particularly with pronation. Patient points to the medial epicondyle as the point of maximal tenderness near pronator and flexor tendinous insertion. ? ?No trauma.  ? ?Medial elbow pain and hand felt a little bit numb. ?Happened a few weeks ago, but over the weekend, it was doing weorse.  ? ?No prior fractures or operative interventions in the effective hand. ?Prior PT or HEP: none ? ?Denies numbness or tingling. ?No significant neck or shoulder pain. ? ?Hand of dominance: RHD ? ? ?Review of Systems is noted in the HPI, as appropriate ? ?Objective:  ? ?Blood pressure 120/72, pulse 78, temperature 97.9 ?F (36.6 ?C), temperature source Oral, height 5' 4.5" (1.638 m), weight 177 lb 1 oz (80.3 kg), SpO2 96 %. ? ?GEN: No acute distress; alert,appropriate. ?PULM: Breathing comfortably in no respiratory distress ?PSYCH: Normally interactive.  ? ? ?Elbow: R ?Ecchymosis or edema: neg ?ROM: full flexion, extension, pronation, supination ?Shoulder ROM: Full ?Flexion: 5/5 ?Extension: 5/5 ?Supination: 5/5  ?Pronation: 5/5 ?Wrist ext: 5/5 ?Wrist flexion: 5/5 ?No gross bony abnormality ?Varus and Valgus stress: stable ?ECRB tenderness: neg ?Medial epicondyle: tender at flexor tendon insertion ?Lateral epicondyle, resisted wrist extension  from wrist full pronation and flexion: NT ?Resisted pronation: very tender ?grip: 5/5 ? ?sensation intact ?Tinel's, Elbow: negative ? ?Subjective:  ? ?  ICD-10-CM   ?1. Medial epicondylitis of right elbow  M77.01   ?  ? ? ?Elbow anatomy was reviewed, and tendinopathy was explained. ? ?Pt. given a formal rehab program from [facility] on elbow rehabiliation.  ?Start off with isometrics and gentle stretching and ROM progressing to a series of concentric and eccentric exercises should be done starting with no weight, work up to 1 lb, hammer, etc. ? ?Use counterforce strap if working or using hands. ? ?Emphasized stretching an cross-friction massage ? ?No additional intervention.  If persists, then I would first do PT.  If failure here, would consider ME injection. ? ?I appreciate the opportunity to evaluate this very friendly patient. If you have any question regarding her care or prognosis, do not hesitate to ask.  ? ?Patient Instructions  ?Voltaren 1% gel, over the counter ?You can apply up to 4 times a day ? ?This can be applied to any joint: knee, wrist, fingers, elbows, shoulders, feet and ankles. ?Can apply to any tendon: tennis elbow, achilles, tendon, rotator cuff or any other tendon. ? ?Minimal is absorbed in the bloodstream: ok with oral anti-inflammatory or a blood thinner. ? ?Cost is about 9 dollars   ? ?Follow-up: 4 weeks if no improvement ? ?Signed, ? ?Dorotha Hirschi T. Thi Klich, MD ? ? ?Patient's Medications  ?New Prescriptions  ? No medications on file  ?Previous Medications  ? AZELASTINE (ASTELIN) 0.1 % NASAL SPRAY  SPRAY 1-2 SPRAYS IN EACH NOSTRIL TWICE DAILY.  ? ESCITALOPRAM (LEXAPRO) 20 MG TABLET    TAKE 1 TABLET BY MOUTH EVERY DAY  ? EVOLOCUMAB (REPATHA SURECLICK) 352 MG/ML SOAJ    Inject 1 mL into the skin every 14 (fourteen) days.  ? FLUTICASONE (FLONASE) 50 MCG/ACT NASAL SPRAY    Place 2 sprays into both nostrils daily.  ? IBUPROFEN (ADVIL) 800 MG TABLET    Take 1 tablet (800 mg total) by mouth every 8  (eight) hours as needed.  ? MULTIPLE VITAMIN (MULTIVITAMIN) TABLET    Take 1 tablet by mouth daily.  ? OMEGA-3 FATTY ACIDS (OMEGA 3 500 PO)    Take by mouth.  ? OMEPRAZOLE (PRILOSEC) 20 MG CAPSULE    TAKE 1 CAPSULE BY MOUTH EVERY DAY  ? PEG-KCL-NACL-NASULF-NA ASC-C (PLENVU) 140 G SOLR    Take 1 kit by mouth as directed.  ? TRAMADOL (ULTRAM) 50 MG TABLET    Take 1 every 4-6 hours as needed for pain not controlled by Tylenol  ?Modified Medications  ? No medications on file  ?Discontinued Medications  ? No medications on file  ?  ?

## 2022-01-28 ENCOUNTER — Encounter: Payer: PPO | Admitting: Gastroenterology

## 2022-03-06 ENCOUNTER — Ambulatory Visit (AMBULATORY_SURGERY_CENTER): Payer: PPO | Admitting: Gastroenterology

## 2022-03-06 ENCOUNTER — Encounter: Payer: Self-pay | Admitting: Gastroenterology

## 2022-03-06 VITALS — BP 171/86 | HR 64 | Temp 98.4°F | Resp 15 | Ht 64.5 in | Wt 173.0 lb

## 2022-03-06 DIAGNOSIS — K635 Polyp of colon: Secondary | ICD-10-CM

## 2022-03-06 DIAGNOSIS — Z1211 Encounter for screening for malignant neoplasm of colon: Secondary | ICD-10-CM

## 2022-03-06 DIAGNOSIS — D123 Benign neoplasm of transverse colon: Secondary | ICD-10-CM

## 2022-03-06 MED ORDER — SODIUM CHLORIDE 0.9 % IV SOLN: 500.0000 mL | Freq: Once | INTRAVENOUS | Status: DC

## 2022-03-06 NOTE — Progress Notes (Signed)
Called to room to assist during endoscopic procedure.  Patient ID and intended procedure confirmed with present staff. Received instructions for my participation in the procedure from the performing physician.  

## 2022-03-06 NOTE — Progress Notes (Signed)
VSS, transported to PACU °

## 2022-03-06 NOTE — Progress Notes (Unsigned)
VS by CW  Pt's states no medical or surgical changes since previsit or office visit.  

## 2022-03-06 NOTE — Progress Notes (Signed)
[facility] History and Physical   Primary Care Physician:  [name], Clifford   Reason for Procedure:  Colorectal cancer screening  Plan:    Screening colonoscopy with possible interventions as needed     HPI: [name] is a very pleasant 66 y.o. female here for screening colonoscopy. Denies any nausea, vomiting, abdominal pain, melena or bright red blood per rectum  The risks and benefits as well as alternatives of endoscopic procedure(s) have been discussed and reviewed. All questions answered. The patient agrees to proceed.    Past Medical History:  Diagnosis Date   Anemia    Anxiety    Blood transfusion without reported diagnosis    Breast cancer ([place]) 02/27/2007   Right   COVID-19 08/30/2020   GERD (gastroesophageal reflux disease)    Hypercholesterolemia    Hypertension    Migraine    Hx of in past   Personal history of radiation therapy 2008   Right   Wears dentures    partial upper and lower    Past Surgical History:  Procedure Laterality Date   ABDOMINAL HYSTERECTOMY     BREAST BIOPSY Right 02/27/2007   BREAST LUMPECTOMY Right 04/02/2007   BREAST SURGERY  01/14/2013   breast reduction left side   BROW LIFT Bilateral 06/17/2017   Procedure: BLEPHAROPLASTY UPPER EYELID WITH EXCESS SKIN;  Surgeon: Karle Starch, MD;  Location: [facility];  Service: Ophthalmology;  Laterality: Bilateral;   BROW LIFT Bilateral 11/30/2021   Procedure: BROW PTOSIS REPAIR BILATERAL;  Surgeon: Karle Starch, MD;  Location: [facility];  Service: Ophthalmology;  Laterality: Bilateral;   CESAREAN SECTION     two times   COLONOSCOPY     FOOT SURGERY Right    PTOSIS REPAIR Bilateral 06/17/2017   Procedure: PTOSIS REPAIR RESECT EX;  Surgeon: Karle Starch, MD;  Location: [facility];  Service: Ophthalmology;  Laterality: Bilateral;   REDUCTION MAMMAPLASTY Left    SHOULDER ARTHROSCOPY WITH ROTATOR CUFF REPAIR AND OPEN BICEPS TENODESIS Right  02/01/2019   Procedure: RIGHT SHOULDER ARTHROSCOPY, DEBRIDEMENT, BICEPS TENODESIS, MINI ROTATOR CUFF TEAR REPAIR;  Surgeon: Meredith Pel, MD;  Location: [facility];  Service: Orthopedics;  Laterality: Right;    Prior to Admission medications   Medication Sig Start Date End Date Taking? Authorizing Provider  escitalopram (LEXAPRO) 20 MG tablet TAKE 1 TABLET BY MOUTH EVERY DAY 01/01/22  Yes Dugal, Tabitha, FNP  Multiple Vitamin (MULTIVITAMIN) tablet Take 1 tablet by mouth daily.   Yes [provider]  Omega-3 Fatty Acids (OMEGA 3 500 PO) Take by mouth.   Yes [provider]  omeprazole (PRILOSEC) 20 MG capsule TAKE 1 CAPSULE BY MOUTH EVERY DAY 01/01/22  Yes Dugal, Tabitha, FNP  azelastine (ASTELIN) 0.1 % nasal spray SPRAY 1-2 SPRAYS IN EACH NOSTRIL TWICE DAILY. 10/22/21   [provider]  Evolocumab (REPATHA SURECLICK) 678 MG/ML SOAJ Inject 1 mL into the skin every 14 (fourteen) days. Patient not taking: Reported on 01/23/2022 01/11/22   End, Harrell Gave, MD  fluticasone [facility]) 50 MCG/ACT nasal spray Place 2 sprays into both nostrils daily. 06/25/18   Lucille Passy, MD  ibuprofen (ADVIL) 800 MG tablet Take 1 tablet (800 mg total) by mouth every 8 (eight) hours as needed. 03/30/20   Lesleigh Noe, MD  traMADol [facility]) 50 MG tablet Take 1 every 4-6 hours as needed for pain not controlled by Tylenol 11/30/21   Karle Starch, MD  Current Outpatient Medications  Medication Sig Dispense Refill   escitalopram (LEXAPRO) 20 MG tablet TAKE 1 TABLET BY MOUTH EVERY DAY 90 tablet 2   Multiple Vitamin (MULTIVITAMIN) tablet Take 1 tablet by mouth daily.     Omega-3 Fatty Acids (OMEGA 3 500 PO) Take by mouth.     omeprazole (PRILOSEC) 20 MG capsule TAKE 1 CAPSULE BY MOUTH EVERY DAY 90 capsule 1   azelastine (ASTELIN) 0.1 % nasal spray SPRAY 1-2 SPRAYS IN EACH NOSTRIL TWICE DAILY.     Evolocumab (REPATHA SURECLICK) 629 MG/ML SOAJ Inject 1 mL into the skin every  14 (fourteen) days. (Patient not taking: Reported on 01/23/2022) 2 mL 3   fluticasone (FLONASE) 50 MCG/ACT nasal spray Place 2 sprays into both nostrils daily. 16 g 6   ibuprofen (ADVIL) 800 MG tablet Take 1 tablet (800 mg total) by mouth every 8 (eight) hours as needed. 90 tablet 0   traMADol (ULTRAM) 50 MG tablet Take 1 every 4-6 hours as needed for pain not controlled by Tylenol 6 tablet 0   Current Facility-Administered Medications  Medication Dose Route Frequency Provider Last Rate Last Admin   0.9 %  sodium chloride infusion  500 mL Intravenous Once Mauri Pole, MD        Allergies as of 03/06/2022 - Review Complete 03/06/2022  Allergen Reaction Noted   Ezetimibe Other (See Comments) 01/11/2022   Statins Other (See Comments) 05/15/2015   Losartan Other (See Comments) 01/11/2022    Family History  Problem Relation Age of Onset   Thyroid disease Mother    Hypertension Mother    Hyperlipidemia Mother    Alzheimer's disease Mother    Heart failure Mother    Esophageal cancer Father    Hyperlipidemia Father    Heart murmur Father    Colon polyps Father    COPD Father    Aneurysm Father    Diabetes Sister    Breast cancer Sister    Irritable bowel syndrome Sister    Ulcerative colitis Sister    Rheum arthritis Sister    Alzheimer's disease Maternal Aunt    Alzheimer's disease Maternal Uncle    Diabetes Paternal Uncle    Alzheimer's disease Maternal Grandmother    Diabetes Paternal Grandmother    Breast cancer Cousin    Breast cancer Cousin    Colon cancer Neg Hx    Stomach cancer Neg Hx    Rectal cancer Neg Hx     Social History   Socioeconomic History   Marital status: Married    Spouse name: [name]   Number of children: 2   Years of education: high school   Highest education level: Not on file  Occupational History   Not on file  Tobacco Use   Smoking status: Never   Smokeless tobacco: Never  Vaping Use   Vaping Use: Never used  Substance and  Sexual Activity   Alcohol use: Not Currently   Drug use: No   Sexual activity: Yes    Partners: Male    Birth control/protection: Surgical  Other Topics Concern   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      Family: 2 daughters - [name] and [name] - 3 grandchildren      Enjoys: boating, walking, beach, fishing, outdoor activity  Exercise: keeps busy moving around the house, irregular   Diet: chicken, some read meat, veggies, salads - trying to limit sugar      Safety   Seat belts: Yes    Guns: Yes  and secure   Safe in relationships: Yes    Social Determinants of Health   Financial Resource Strain: Not on file  Food Insecurity: Not on file  Transportation Needs: Not on file  Physical Activity: Not on file  Stress: Not on file  Social Connections: Not on file  Intimate Partner Violence: Not on file    Review of Systems:  All other review of systems negative except as mentioned in the HPI.  Physical Exam: Vital signs in last 24 hours: BP (!) 145/78   Pulse 77   Temp 98.4 F (36.9 C)   Ht 5' 4.5" (1.638 m)   Wt 173 lb (78.5 kg)   SpO2 94%   BMI 29.24 kg/m  General:   Alert, NAD Lungs:  Clear .   Heart:  Regular rate and rhythm Abdomen:  Soft, nontender and nondistended. Neuro/Psych:  Alert and cooperative. Normal mood and affect. A and O x 3  Reviewed labs, radiology imaging, old records and pertinent past GI work up  Patient is appropriate for planned procedure(s) and anesthesia in an ambulatory setting   K. Denzil Magnuson , MD [phone]

## 2022-03-06 NOTE — Op Note (Signed)
[facility] Patient Name: [name] Procedure Date: 03/06/2022 3:56 PM MRN: [id number] Endoscopist: Mauri Pole , MD Age: 66 Referring MD:  Date of Birth: 05/25/1956 Gender: Female Account #: [id number] Procedure:                Colonoscopy Indications:              Screening for colorectal malignant neoplasm Medicines:                Monitored Anesthesia Care Procedure:                Pre-Anesthesia Assessment:                           - Prior to the procedure, a History and Physical                            was performed, and patient medications and                            allergies were reviewed. The patient's tolerance of                            previous anesthesia was also reviewed. The risks                            and benefits of the procedure and the sedation                            options and risks were discussed with the patient.                            All questions were answered, and informed consent                            was obtained. ASA Grade Assessment: II - A patient                            with mild systemic disease. After reviewing the                            risks and benefits, the patient was deemed in                            satisfactory condition to undergo the procedure.                           After obtaining informed consent, the colonoscope                            was passed under direct vision. Throughout the                            procedure, the patient's blood pressure, pulse, and  oxygen saturations were monitored continuously. The                            PCF-HQ190L Colonoscope was introduced through the                            anus and advanced to the the cecum, identified by                            appendiceal orifice and ileocecal valve. The                            colonoscopy was performed without difficulty. The                            patient  tolerated the procedure well. The quality                            of the bowel preparation was adequate. The                            ileocecal valve, appendiceal orifice, and rectum                            were photographed. Scope In: 4:04:53 PM Scope Out: 4:20:03 PM Scope Withdrawal Time: 0 hours 8 minutes 5 seconds  Total Procedure Duration: 0 hours 15 minutes 10 seconds  Findings:                 The perianal and digital rectal examinations were                            normal.                           A 1 mm polyp was found in the transverse colon. The                            polyp was sessile. The polyp was removed with a                            cold biopsy forceps. Resection and retrieval were                            complete.                           A few small-mouthed diverticula were found in the                            sigmoid colon.                           Non-bleeding external and internal hemorrhoids were  found during retroflexion. The hemorrhoids were                            small. Complications:            No immediate complications. Estimated Blood Loss:     Estimated blood loss was minimal. Impression:               - One 1 mm polyp in the transverse colon, removed                            with a cold biopsy forceps. Resected and retrieved.                           - Diverticulosis in the sigmoid colon.                           - Non-bleeding external and internal hemorrhoids. Recommendation:           - Patient has a contact number available for                            emergencies. The signs and symptoms of potential                            delayed complications were discussed with the                            patient. Return to normal activities tomorrow.                            Written discharge instructions were provided to the                            patient.                           -  Resume previous diet.                           - Continue present medications.                           - Await pathology results.                           - Repeat colonoscopy in 5-10 years for surveillance                            based on pathology results. Mauri Pole, MD 03/06/2022 4:29:19 PM This report has been signed electronically.

## 2022-03-06 NOTE — Patient Instructions (Signed)
Information on polyps, diverticulosis and hemorrhoids given to you today.  Await pathology results.  Resume previous diet and medications.   YOU HAD AN ENDOSCOPIC PROCEDURE TODAY AT [facility]:   Refer to the procedure report that was given to you for any specific questions about what was found during the examination.  If the procedure report does not answer your questions, please call your gastroenterologist to clarify.  If you requested that your care partner not be given the details of your procedure findings, then the procedure report has been included in a sealed envelope for you to review at your convenience later.  YOU SHOULD EXPECT: Some feelings of bloating in the abdomen. Passage of more gas than usual.  Walking can help get rid of the air that was put into your GI tract during the procedure and reduce the bloating. If you had a lower endoscopy (such as a colonoscopy or flexible sigmoidoscopy) you may notice spotting of blood in your stool or on the toilet paper. If you underwent a bowel prep for your procedure, you may not have a normal bowel movement for a few days.  Please Note:  You might notice some irritation and congestion in your nose or some drainage.  This is from the oxygen used during your procedure.  There is no need for concern and it should clear up in a day or so.  SYMPTOMS TO REPORT IMMEDIATELY:  Following lower endoscopy (colonoscopy or flexible sigmoidoscopy):  Excessive amounts of blood in the stool  Significant tenderness or worsening of abdominal pains  Swelling of the abdomen that is new, acute  Fever of 100F or higher  For urgent or emergent issues, a gastroenterologist can be reached at any hour by calling [phone]. Do not use MyChart messaging for urgent concerns.    DIET:  We do recommend a small meal at first, but then you may proceed to your regular diet.  Drink plenty of fluids but you should avoid alcoholic beverages for 24  hours.  ACTIVITY:  You should plan to take it easy for the rest of today and you should NOT DRIVE or use heavy machinery until tomorrow (because of the sedation medicines used during the test).    FOLLOW UP: Our staff will call the number listed on your records 24-72 hours following your procedure to check on you and address any questions or concerns that you may have regarding the information given to you following your procedure. If we do not reach you, we will leave a message.  We will attempt to reach you two times.  During this call, we will ask if you have developed any symptoms of COVID 19. If you develop any symptoms (ie: fever, flu-like symptoms, shortness of breath, cough etc.) before then, please call [phone].  If you test positive for Covid 19 in the 2 weeks post procedure, please call and report this information to [place].    If any biopsies were taken you will be contacted by phone or by letter within the next 1-3 weeks.  Please call us at [phone] if you have not heard about the biopsies in 3 weeks.    SIGNATURES/CONFIDENTIALITY: You and/or your care partner have signed paperwork which will be entered into your electronic medical record.  These signatures attest to the fact that that the information above on your After Visit Summary has been reviewed and is understood.  Full responsibility of the confidentiality of this discharge information lies with you and/or  your care-partner.

## 2022-03-07 ENCOUNTER — Telehealth: Payer: Self-pay

## 2022-03-07 NOTE — Telephone Encounter (Signed)
No answer, left message to call if having any issues or concerns, B.Jalin Alicea RN 

## 2022-03-13 ENCOUNTER — Ambulatory Visit: Payer: PPO | Admitting: Nurse Practitioner

## 2022-03-25 ENCOUNTER — Encounter: Payer: Self-pay | Admitting: Family

## 2022-03-26 NOTE — Telephone Encounter (Signed)
Called and spoke to pt. Made an appointment for 7/13

## 2022-03-27 ENCOUNTER — Other Ambulatory Visit: Payer: Self-pay | Admitting: Family

## 2022-03-27 DIAGNOSIS — I10 Essential (primary) hypertension: Secondary | ICD-10-CM

## 2022-03-28 ENCOUNTER — Encounter: Payer: Self-pay | Admitting: Family

## 2022-03-28 ENCOUNTER — Ambulatory Visit (INDEPENDENT_AMBULATORY_CARE_PROVIDER_SITE_OTHER): Payer: PPO | Admitting: Family

## 2022-03-28 ENCOUNTER — Encounter: Payer: Self-pay | Admitting: Internal Medicine

## 2022-03-28 VITALS — BP 164/92 | HR 73 | Temp 98.8°F | Resp 16 | Ht 64.5 in | Wt 178.2 lb

## 2022-03-28 DIAGNOSIS — E785 Hyperlipidemia, unspecified: Secondary | ICD-10-CM | POA: Diagnosis not present

## 2022-03-28 DIAGNOSIS — R635 Abnormal weight gain: Secondary | ICD-10-CM | POA: Diagnosis not present

## 2022-03-28 DIAGNOSIS — E538 Deficiency of other specified B group vitamins: Secondary | ICD-10-CM | POA: Diagnosis not present

## 2022-03-28 DIAGNOSIS — E7801 Familial hypercholesterolemia: Secondary | ICD-10-CM | POA: Diagnosis not present

## 2022-03-28 DIAGNOSIS — E782 Mixed hyperlipidemia: Secondary | ICD-10-CM | POA: Diagnosis not present

## 2022-03-28 DIAGNOSIS — R10826 Epigastric rebound abdominal tenderness: Secondary | ICD-10-CM | POA: Diagnosis not present

## 2022-03-28 DIAGNOSIS — I10 Essential (primary) hypertension: Secondary | ICD-10-CM | POA: Diagnosis not present

## 2022-03-28 LAB — LIPID PANEL
Cholesterol: 303 mg/dL — ABNORMAL HIGH (ref 0–200)
HDL: 55.8 mg/dL (ref >39.00)
LDL Cholesterol: 213 mg/dL — ABNORMAL HIGH (ref 0–99)
NonHDL: 247.64
Total CHOL/HDL Ratio: 5
Triglycerides: 172 mg/dL — ABNORMAL HIGH (ref 0.0–149.0)
VLDL: 34.4 mg/dL (ref 0.0–40.0)

## 2022-03-28 LAB — B12 AND FOLATE PANEL
Folate: 17.4 ng/mL (ref >5.9)
Vitamin B-12: >1504 pg/mL — ABNORMAL HIGH (ref 211–911)

## 2022-03-28 LAB — TSH: TSH: 1.71 u[IU]/mL (ref 0.35–5.50)

## 2022-03-28 MED ORDER — LISINOPRIL 10 MG PO TABS: 10.0000 mg | ORAL_TABLET | Freq: Every day | ORAL | 3 refills | Status: DC

## 2022-03-28 NOTE — Progress Notes (Signed)
Established Patient Office Visit  Subjective:  Patient ID: [name], female    DOB: March 11, 1956  Age: 66 y.o. MRN: [id number]  CC:  Chief Complaint  Patient presents with   Hyperlipidemia    HPI [name] is here today for follow up.   Pt is with acute concerns.  HLD: recently saw cardiologist Dr. Saunders Revel, 01/11/22. She was experiencing ezetimibe but had doe, since resolved since stopping zetia.  Calcium score 07/27/2018. Was zero.  Recommended by him to start repatha 140 mg/ml 1 inj every 14 days.   HTN: stopped losartan 10 mg once daily because felt fatigued while taking it. States at home average upper 397/673 diastolic 80   Does notice an issue with bloating, burping especially after eating red meat, bbq and or burger meat. She does have chronic constipation, she takes miralax once a week which helps her be a bit more regular and then will stop until she restarts.   Past Medical History:  Diagnosis Date   Anemia    Anxiety    Blood transfusion without reported diagnosis    Breast cancer ([place]) 02/27/2007   Right   COVID-19 08/30/2020   GERD (gastroesophageal reflux disease)    Hypercholesterolemia    Hypertension    Migraine    Hx of in past   Personal history of radiation therapy 2008   Right   Wears dentures    partial upper and lower    Past Surgical History:  Procedure Laterality Date   ABDOMINAL HYSTERECTOMY     BREAST BIOPSY Right 02/27/2007   BREAST LUMPECTOMY Right 04/02/2007   BREAST SURGERY  01/14/2013   breast reduction left side   BROW LIFT Bilateral 06/17/2017   Procedure: BLEPHAROPLASTY UPPER EYELID WITH EXCESS SKIN;  Surgeon: Karle Starch, MD;  Location: [facility];  Service: Ophthalmology;  Laterality: Bilateral;   BROW LIFT Bilateral 11/30/2021   Procedure: BROW PTOSIS REPAIR BILATERAL;  Surgeon: Karle Starch, MD;  Location: [place];  Service: Ophthalmology;  Laterality: Bilateral;   CESAREAN SECTION     two times    COLONOSCOPY     FOOT SURGERY Right    PTOSIS REPAIR Bilateral 06/17/2017   Procedure: PTOSIS REPAIR RESECT EX;  Surgeon: Karle Starch, MD;  Location: [facility];  Service: Ophthalmology;  Laterality: Bilateral;   REDUCTION MAMMAPLASTY Left    SHOULDER ARTHROSCOPY WITH ROTATOR CUFF REPAIR AND OPEN BICEPS TENODESIS Right 02/01/2019   Procedure: RIGHT SHOULDER ARTHROSCOPY, DEBRIDEMENT, BICEPS TENODESIS, MINI ROTATOR CUFF TEAR REPAIR;  Surgeon: Meredith Pel, MD;  Location: [facility];  Service: Orthopedics;  Laterality: Right;    Family History  Problem Relation Age of Onset   Thyroid disease Mother    Hypertension Mother    Hyperlipidemia Mother    Alzheimer's disease Mother    Heart failure Mother    Esophageal cancer Father    Hyperlipidemia Father    Heart murmur Father    Colon polyps Father    COPD Father    Aneurysm Father    Diabetes Sister    Breast cancer Sister    Irritable bowel syndrome Sister    Ulcerative colitis Sister    Rheum arthritis Sister    Alzheimer's disease Maternal Aunt    Alzheimer's disease Maternal Uncle    Diabetes Paternal Uncle    Alzheimer's disease Maternal Grandmother    Diabetes Paternal Grandmother    Breast cancer Cousin    Breast cancer  Cousin    Colon cancer Neg Hx    Stomach cancer Neg Hx    Rectal cancer Neg Hx     Social History   Socioeconomic History   Marital status: Married    Spouse name: [name]   Number of children: 2   Years of education: high school   Highest education level: Not on file  Occupational History   Not on file  Tobacco Use   Smoking status: Never   Smokeless tobacco: Never  Vaping Use   Vaping Use: Never used  Substance and Sexual Activity   Alcohol use: Not Currently   Drug use: No   Sexual activity: Yes    Partners: Male    Birth control/protection: Surgical  Other Topics Concern   Not on file  Social History Narrative   01/25/20   From: the area   Living:  with husband, [name] (1980) and her mother   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      Family: 2 daughters - [name] and [name] - 3 grandchildren      Enjoys: boating, walking, beach, fishing, outdoor activity      Exercise: keeps busy moving around the house, irregular   Diet: chicken, some read meat, veggies, salads - trying to limit sugar      Safety   Seat belts: Yes    Guns: Yes  and secure   Safe in relationships: Yes    Social Determinants of Radio broadcast assistant Strain: Not on file  Food Insecurity: Not on file  Transportation Needs: Not on file  Physical Activity: Not on file  Stress: Not on file  Social Connections: Not on file  Intimate Partner Violence: Not on file    Outpatient Medications Prior to Visit  Medication Sig Dispense Refill   azelastine (ASTELIN) 0.1 % nasal spray SPRAY 1-2 SPRAYS IN EACH NOSTRIL TWICE DAILY.     escitalopram (LEXAPRO) 20 MG tablet TAKE 1 TABLET BY MOUTH EVERY DAY 90 tablet 2   Evolocumab (REPATHA SURECLICK) 163 MG/ML SOAJ Inject 1 mL into the skin every 14 (fourteen) days. 2 mL 3   fluticasone (FLONASE) 50 MCG/ACT nasal spray Place 2 sprays into both nostrils daily. 16 g 6   ibuprofen (ADVIL) 800 MG tablet Take 1 tablet (800 mg total) by mouth every 8 (eight) hours as needed. 90 tablet 0   Multiple Vitamin (MULTIVITAMIN) tablet Take 1 tablet by mouth daily.     Omega-3 Fatty Acids (OMEGA 3 500 PO) Take by mouth.     omeprazole (PRILOSEC) 20 MG capsule TAKE 1 CAPSULE BY MOUTH EVERY DAY 90 capsule 1   traMADol (ULTRAM) 50 MG tablet Take 1 every 4-6 hours as needed for pain not controlled by Tylenol 6 tablet 0   No facility-administered medications prior to visit.    Allergies  Allergen Reactions   Ezetimibe Other (See Comments)    Fatigue and constipation   Statins Other (See Comments)    Muscle aches   Losartan Other (See Comments)    Fatigue, malaise         Objective:     Physical Exam Constitutional:      General: She is not in acute distress.    Appearance: Normal appearance. She is obese. She is not ill-appearing, toxic-appearing or diaphoretic.  Cardiovascular:     Rate and Rhythm: Normal rate and regular rhythm.  Pulmonary:     Effort: Pulmonary effort is normal.  Neurological:     General: No focal deficit present.     Mental Status: She is alert and oriented to person, place, and time.  Psychiatric:        Mood and Affect: Mood normal.        Behavior: Behavior normal.        Thought Content: Thought content normal.        Judgment: Judgment normal.       BP (!) 164/92   Pulse 73   Temp 98.8 F (37.1 C)   Resp 16   Ht 5' 4.5" (1.638 m)   Wt 178 lb 4 oz (80.9 kg)   SpO2 98%   BMI 30.12 kg/m  Wt Readings from Last 3 Encounters:  03/28/22 178 lb 4 oz (80.9 kg)  03/06/22 173 lb (78.5 kg)  01/23/22 177 lb 1 oz (80.3 kg)     Health Maintenance Due  Topic Date Due   Hepatitis C Screening  Never done   Zoster Vaccines- Shingrix (1 of 2) Never done   Fecal DNA (Cologuard)  06/17/2021   DEXA SCAN  08/29/2021    There are no preventive care reminders to display for this patient.  Lab Results  Component Value Date   TSH 2.14 10/01/2021   Lab Results  Component Value Date   WBC 4.7 10/01/2021   HGB 12.8 10/01/2021   HCT 39.6 10/01/2021   MCV 85.6 10/01/2021   PLT 253.0 10/01/2021   Lab Results  Component Value Date   NA 138 01/11/2022   K 4.1 01/11/2022   CO2 28 01/11/2022   GLUCOSE 100 (H) 01/11/2022   BUN 16 01/11/2022   CREATININE 0.72 01/11/2022   BILITOT 0.5 01/11/2022   ALKPHOS 67 01/11/2022   AST 22 01/11/2022   ALT 18 01/11/2022   PROT 7.5 01/11/2022   ALBUMIN 4.0 01/11/2022   CALCIUM 9.0 01/11/2022   ANIONGAP 9 01/11/2022   GFR 91.29 10/01/2021   Lab Results  Component Value Date   CHOL 241 (H) 01/11/2022   Lab Results  Component Value Date   HDL 61 01/11/2022   Lab Results  Component Value  Date   LDLCALC 153 (H) 01/11/2022   Lab Results  Component Value Date   TRIG 134 01/11/2022   Lab Results  Component Value Date   CHOLHDL 4.0 01/11/2022   Lab Results  Component Value Date   HGBA1C 6.4 09/27/2020      Assessment & Plan:   Problem List Items Addressed This Visit       Cardiovascular and Mediastinum   Primary hypertension    Pt d/c losartan Start lisinopril 10 mg once daily. Pt advised of the following:  Continue medication as prescribed. Monitor blood pressure periodically and/or when you feel symptomatic. Goal is <130/90 on average. Ensure that you have rested for 30 minutes prior to checking your blood pressure. Record your readings and bring them to your next visit if necessary.work on a low sodium diet.       Relevant Medications   lisinopril (ZESTRIL) 10 MG tablet     Other   Vitamin B 12 deficiency    Recheck b12 today pending results      Relevant Orders   B12 and Folate Panel   Heterozygous familial hypercholesterolemia    Long d/w pt in regards to repatha.  Strongly advised pt and agreed with plan by cardiologist to start on this. Advised pt of risks of not taking medication to help with hld.  Pt will strongly consider after this lipid panel today, ordered, and pending results.       Relevant Medications   lisinopril (ZESTRIL) 10 MG tablet   Epigastric abdominal tenderness with rebound tenderness    Let's r/o alpha gal and pylori pending results       Relevant Orders   Alpha-Gal Panel   H. pylori breath test   Weight gain    Reviewed tsh, wnl  Will repeat today Work on fodmap diet see if this helps with bloating Exercise as tolerated      Relevant Orders   TSH   RESOLVED: HLD (hyperlipidemia) - Primary    Long d/w pt in regards to repatha.  Strongly advised pt and agreed with plan by cardiologist to start on this. Advised pt of risks of not taking medication to help with hld.  Pt will strongly consider after this lipid  panel today, ordered, and pending results.       Relevant Medications   lisinopril (ZESTRIL) 10 MG tablet   Other Relevant Orders   Lipid panel   Lipid panel    Meds ordered this encounter  Medications   lisinopril (ZESTRIL) 10 MG tablet    Sig: Take 1 tablet (10 mg total) by mouth daily.    Dispense:  90 tablet    Refill:  3    Order Specific Question:   Supervising Provider    Answer:   Diona Browner, AMY E [[id number]]    Follow-up: Return in about 1 month (around 04/28/2022) for with blood pressure log for follow up .    [name], FNP

## 2022-03-28 NOTE — Assessment & Plan Note (Signed)
Long d/w pt in regards to repatha.  Strongly advised pt and agreed with plan by cardiologist to start on this. Advised pt of risks of not taking medication to help with hld.  Pt will strongly consider after this lipid panel today, ordered, and pending results.

## 2022-03-28 NOTE — Patient Instructions (Addendum)
Start lisinopril 10 mg once daily.   Start monitoring your blood pressure daily, around the same time of day, for the next 2-3 weeks.  Ensure that you have rested for 30 minutes prior to checking your blood pressure. Record your readings and bring them to your next visit.  Stop by the lab prior to leaving today. I will notify you of your results once received.   Due to recent changes in healthcare laws, you may see results of your imaging and/or laboratory studies on MyChart before I have had a chance to review them.  I understand that in some cases there may be results that are confusing or concerning to you. Please understand that not all results are received at the same time and often I may need to interpret multiple results in order to provide you with the best plan of care or course of treatment. Therefore, I ask that you please give me 2 business days to thoroughly review all your results before contacting my office for clarification. Should we see a critical lab result, you will be contacted sooner.   It was a pleasure seeing you today! Please do not hesitate to reach out with any questions and or concerns.  Regards,   [name] FNP-C

## 2022-03-28 NOTE — Assessment & Plan Note (Signed)
Reviewed tsh, wnl  Will repeat today Work on fodmap diet see if this helps with bloating Exercise as tolerated

## 2022-03-28 NOTE — Assessment & Plan Note (Signed)
Let's r/o alpha gal and pylori pending results

## 2022-03-28 NOTE — Assessment & Plan Note (Signed)
Recheck b12 today pending results. 

## 2022-03-28 NOTE — Assessment & Plan Note (Signed)
Pt d/c losartan Start lisinopril 10 mg once daily. Pt advised of the following:  Continue medication as prescribed. Monitor blood pressure periodically and/or when you feel symptomatic. Goal is <130/90 on average. Ensure that you have rested for 30 minutes prior to checking your blood pressure. Record your readings and bring them to your next visit if necessary.work on a low sodium diet.

## 2022-04-01 ENCOUNTER — Encounter: Payer: Self-pay | Admitting: Gastroenterology

## 2022-04-02 LAB — INTERPRETATION:

## 2022-04-02 LAB — ALPHA-GAL PANEL
Allergen, Mutton, f88: <0.1 kU/L
Allergen, Pork, f26: <0.1 kU/L
Beef: <0.1 kU/L
CLASS: 0
CLASS: 0
Class: 0
GALACTOSE-ALPHA-1,3-GALACTOSE IGE*: <0.1 kU/L (ref <0.10)

## 2022-04-02 LAB — UNLABELED: Test Ordered On Req: 14839

## 2022-04-02 NOTE — Progress Notes (Signed)
Do we know what this is?

## 2022-04-04 NOTE — Telephone Encounter (Signed)
Hey [name]  Can you look Into the h pylori test? Sent out 7/13 I feel like I typically should see a result by now. Pt inquiring

## 2022-04-10 LAB — PAT ID TIQ DOC: Test Affected: 14839

## 2022-04-10 LAB — H. PYLORI BREATH TEST: H. pylori Breath Test: NOT DETECTED

## 2022-05-01 ENCOUNTER — Encounter: Payer: Self-pay | Admitting: Internal Medicine

## 2022-05-02 ENCOUNTER — Telehealth: Payer: Self-pay | Admitting: Internal Medicine

## 2022-05-02 NOTE — Telephone Encounter (Signed)
Left VM to schedule 

## 2022-05-03 ENCOUNTER — Ambulatory Visit (HOSPITAL_BASED_OUTPATIENT_CLINIC_OR_DEPARTMENT_OTHER): Payer: PPO | Admitting: Internal Medicine

## 2022-05-07 NOTE — Progress Notes (Unsigned)
Cardiology Clinic Note   Patient Name: [name] Date of Encounter: 05/08/2022  Primary Care Provider:  [name], FNP Primary Cardiologist:  Nelva Bush, MD  Patient Profile    66 year old female with past medical history of essential hypertension, hyperlipidemia, breast cancer, gastroesophageal reflux disease, and migraines, who is here today for follow-up on her hyperlipidemia.  Past Medical History    Past Medical History:  Diagnosis Date   Anemia    Anxiety    Blood transfusion without reported diagnosis    Breast cancer ([place]) 02/27/2007   Right   COVID-19 08/30/2020   GERD (gastroesophageal reflux disease)    Hypercholesterolemia    Hypertension    Migraine    Hx of in past   Personal history of radiation therapy 2008   Right   Wears dentures    partial upper and lower   Past Surgical History:  Procedure Laterality Date   ABDOMINAL HYSTERECTOMY     BREAST BIOPSY Right 02/27/2007   BREAST LUMPECTOMY Right 04/02/2007   BREAST SURGERY  01/14/2013   breast reduction left side   BROW LIFT Bilateral 06/17/2017   Procedure: BLEPHAROPLASTY UPPER EYELID WITH EXCESS SKIN;  Surgeon: Karle Starch, MD;  Location: [facility];  Service: Ophthalmology;  Laterality: Bilateral;   BROW LIFT Bilateral 11/30/2021   Procedure: BROW PTOSIS REPAIR BILATERAL;  Surgeon: Karle Starch, MD;  Location: [facility];  Service: Ophthalmology;  Laterality: Bilateral;   CESAREAN SECTION     two times   COLONOSCOPY     FOOT SURGERY Right    PTOSIS REPAIR Bilateral 06/17/2017   Procedure: PTOSIS REPAIR RESECT EX;  Surgeon: Karle Starch, MD;  Location: [facility];  Service: Ophthalmology;  Laterality: Bilateral;   REDUCTION MAMMAPLASTY Left    SHOULDER ARTHROSCOPY WITH ROTATOR CUFF REPAIR AND OPEN BICEPS TENODESIS Right 02/01/2019   Procedure: RIGHT SHOULDER ARTHROSCOPY, DEBRIDEMENT, BICEPS TENODESIS, MINI ROTATOR CUFF TEAR REPAIR;  Surgeon: Meredith Pel, MD;  Location: [facility];  Service: Orthopedics;  Laterality: Right;    Allergies  Allergies  Allergen Reactions   Ezetimibe Other (See Comments)    Fatigue and constipation   Statins Other (See Comments)    Muscle aches   Losartan Other (See Comments)    Fatigue, malaise    History of Present Illness    66 year old female with a mentioned past medical history of essential hypertension, hyperlipidemia, breast cancer, gastroesophageal reflux disease, and migraines.  She was last seen in the clinic in 01/04/2022 previously been lost to follow-up since 10/2018.  She agreed to trial of co-Q10 with low-dose rosuvastatin.  Mr. Berdine Addison did not tolerate that regimen subsequently placed on ezetimibe and reduced fries at the direction of her PCP.  She was also having symptoms including fatigue and constipation taking both of those.  She notes she was recently placed on blood pressure medication of losartan as well due to elevated blood pressure.  This worsened her fatigue and generalized malaise even further prompting her to stop the medication after about 10 days.  Continue notably cholesterol levels and LDL she was started on Repatha therapy and has been taking that for approximately the past month and a half.  She returns to clinic today with complaints of fatigue and dyspnea on exertion.  She stated that she has been taking Repatha and had 1 episode where she had some leg pain, which subsequently was self-limiting and resolved by the next day.  She does have a follow-up coming up with her PCP to recheck her lipid panel after being on Repatha after a few months.  She was also restarted on a different blood pressure medication of lisinopril to help with her blood pressure as her PCP had advised her that elevated blood pressure could be contributing to her fatigue.  She denies any recent hospitalizations or visits to the emergency department.  Home Medications    Current  Outpatient Medications  Medication Sig Dispense Refill   azelastine (ASTELIN) 0.1 % nasal spray SPRAY 1-2 SPRAYS IN EACH NOSTRIL TWICE DAILY.     escitalopram (LEXAPRO) 20 MG tablet TAKE 1 TABLET BY MOUTH EVERY DAY 90 tablet 2   Evolocumab (REPATHA SURECLICK) 509 MG/ML SOAJ Inject 1 mL into the skin every 14 (fourteen) days. 2 mL 3   fluticasone (FLONASE) 50 MCG/ACT nasal spray Place 2 sprays into both nostrils daily. 16 g 6   ibuprofen (ADVIL) 800 MG tablet Take 1 tablet (800 mg total) by mouth every 8 (eight) hours as needed. 90 tablet 0   lisinopril (ZESTRIL) 10 MG tablet Take 1 tablet (10 mg total) by mouth daily. 90 tablet 3   Multiple Vitamin (MULTIVITAMIN) tablet Take 1 tablet by mouth daily.     Omega-3 Fatty Acids (OMEGA 3 500 PO) Take by mouth.     omeprazole (PRILOSEC) 20 MG capsule TAKE 1 CAPSULE BY MOUTH EVERY DAY 90 capsule 1   traMADol (ULTRAM) 50 MG tablet Take 1 every 4-6 hours as needed for pain not controlled by Tylenol 6 tablet 0   No current facility-administered medications for this visit.     Family History    Family History  Problem Relation Age of Onset   Thyroid disease Mother    Hypertension Mother    Hyperlipidemia Mother    Alzheimer's disease Mother    Heart failure Mother    Esophageal cancer Father    Hyperlipidemia Father    Heart murmur Father    Colon polyps Father    COPD Father    Aneurysm Father    Diabetes Sister    Breast cancer Sister    Irritable bowel syndrome Sister    Ulcerative colitis Sister    Rheum arthritis Sister    Alzheimer's disease Maternal Aunt    Alzheimer's disease Maternal Uncle    Diabetes Paternal Uncle    Alzheimer's disease Maternal Grandmother    Diabetes Paternal Grandmother    Breast cancer Cousin    Breast cancer Cousin    Colon cancer Neg Hx    Stomach cancer Neg Hx    Rectal cancer Neg Hx    She indicated that her mother is alive. She indicated that her father is deceased. She indicated that the status  of her maternal grandmother is unknown. She indicated that the status of her paternal grandmother is unknown. She indicated that the status of her maternal aunt is unknown. She indicated that the status of her maternal uncle is unknown. She indicated that the status of her paternal uncle is unknown. She indicated that the status of her neg hx is unknown.  Social History    Social History   Socioeconomic History   Marital status: Married    Spouse name: [name]   Number of children: 2   Years of education: high school   Highest education level: Not on file  Occupational History   Not on file  Tobacco Use   Smoking status: Never   Smokeless tobacco:  Never  Vaping Use   Vaping Use: Never used  Substance and Sexual Activity   Alcohol use: Not Currently   Drug use: No   Sexual activity: Yes    Partners: Male    Birth control/protection: Surgical  Other Topics Concern   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      Family: 2 daughters - [name] and [name] - 3 grandchildren      Enjoys: boating, walking, beach, fishing, outdoor activity      Exercise: keeps busy moving around the house, irregular   Diet: chicken, some read meat, veggies, salads - trying to limit sugar      Safety   Seat belts: Yes    Guns: Yes  and secure   Safe in relationships: Yes    Social Determinants of Radio broadcast assistant Strain: Not on file  Food Insecurity: Not on file  Transportation Needs: Not on file  Physical Activity: Not on file  Stress: Not on file  Social Connections: Not on file  Intimate Partner Violence: Not on file     Review of Systems    General:  No chills, fever, night sweats or weight changes.  Endorses fatigue Cardiovascular:  No chest pain, shortness of dyspnea on exertion, edema, orthopnea, palpitations,  paroxysmal nocturnal dyspnea. Dermatological: No rash, lesions/masses Respiratory: No cough, endorses dyspnea Urologic: No hematuria, dysuria Abdominal:   No nausea, vomiting, diarrhea, bright red blood per rectum, melena, or hematemesis Neurologic:  No visual changes, wkns, changes in mental status. All other systems reviewed and are otherwise negative except as noted above.   Physical Exam    VS:  BP 134/84 (BP Location: Left Arm, Patient Position: Sitting, Cuff Size: Normal)   Pulse 80   Ht 5' 5.5" (1.664 m)   Wt 178 lb 6.4 oz (80.9 kg)   SpO2 98%   BMI 29.24 kg/m  , BMI Body mass index is 29.24 kg/m.     GEN: Well nourished, well developed, in no acute distress. HEENT: normal. Neck: Supple, no JVD, carotid bruits, or masses. Cardiac: RRR, no murmurs, rubs, or gallops. No clubbing, cyanosis, edema.  Radials/DP/PT 2+ and equal bilaterally.  Respiratory:  Respirations regular and unlabored, clear to auscultation bilaterally. GI: Soft, nontender, nondistended, BS + x 4. MS: no deformity or atrophy. Skin: warm and dry, no rash. Neuro:  Strength and sensation are intact. Psych: Normal affect.  Accessory Clinical Findings    ECG personally reviewed by me today-no new tracings were completed today  Lab Results  Component Value Date   WBC 4.7 10/01/2021   HGB 12.8 10/01/2021   HCT 39.6 10/01/2021   MCV 85.6 10/01/2021   PLT 253.0 10/01/2021   Lab Results  Component Value Date   CREATININE 0.72 01/11/2022   BUN 16 01/11/2022   NA 138 01/11/2022   K 4.1 01/11/2022   CL 101 01/11/2022   CO2 28 01/11/2022   Lab Results  Component Value Date   ALT 18 01/11/2022   AST 22 01/11/2022   ALKPHOS 67 01/11/2022   BILITOT 0.5 01/11/2022   Lab Results  Component Value Date   CHOL 303 (H) 03/28/2022   HDL 55.80 03/28/2022   LDLCALC 213 (H) 03/28/2022   LDLDIRECT 209.0 06/04/2019   TRIG 172.0 (H) 03/28/2022  CHOLHDL 5 03/28/2022    Lab Results  Component Value Date    HGBA1C 6.4 09/27/2020    Assessment & Plan   1.  Hyperlipidemia with longstanding history of elevated LDL up to 223 in 09/2021.  Given her family history significant hyperlipidemia as well as aneurysms in her father she was found to likely have heterozygous familial hypercholesterolemia.  Her calcium score in 2019 was reassuring at 2 0, recent abdominal CT demonstrated aortic atherosclerosis.  She had failure with co-Q10 and rosuvastatin as well as ezetimibe and red yeast rice and has now been taking Repatha for the past month and a half 140 mg subcu every 2 weeks which she will continue with that therapy.  Repeat lipid panel with her PCP in the next few weeks.  Given her elevated ASCVD risk with aortic atherosclerosis she may benefit from addition of low-dose aspirin.  2.  Hypertension with blood pressure today 130/84.  She states her blood pressure has been better controlled and she is taking lisinopril 10 mg daily that was started by her PCP.  Blood pressures are stable days we will continue to monitor at this time with no changes made to her current medication regimen. She is to continue with dietary modifications.  3.  Dyspnea on exertion has been worsening over the last few months that originally she thought was starting with medications as a side effect but now it is still persisted after stopping all medications.  So other symptoms of fatigue can discontinue and that if been progressively worsening she will be scheduled for an echocardiogram to determine if her DOE is related to underlying ASCVD or other structural heart abnormality.  4. Disposition: patient to return to clinic to follow up with MD/APP in 3 months  Thai Burgueno, NP 05/08/2022, 12:11 PM

## 2022-05-08 ENCOUNTER — Ambulatory Visit: Payer: PPO | Admitting: Cardiology

## 2022-05-08 VITALS — BP 134/84 | HR 80 | Ht 65.5 in | Wt 178.4 lb

## 2022-05-08 DIAGNOSIS — R0609 Other forms of dyspnea: Secondary | ICD-10-CM | POA: Diagnosis not present

## 2022-05-08 DIAGNOSIS — E7801 Familial hypercholesterolemia: Secondary | ICD-10-CM | POA: Diagnosis not present

## 2022-05-08 DIAGNOSIS — I10 Essential (primary) hypertension: Secondary | ICD-10-CM

## 2022-05-08 DIAGNOSIS — R0602 Shortness of breath: Secondary | ICD-10-CM

## 2022-05-08 NOTE — Patient Instructions (Signed)
Medication Instructions:   Your physician recommends that you continue on your current medications as directed. Please refer to the Current Medication list given to you today.  *If you need a refill on your cardiac medications before your next appointment, please call your pharmacy*   Testing/Procedures:  Your physician has requested that you have an echocardiogram. Echocardiography is a painless test that uses sound waves to create images of your heart. It provides your doctor with information about the size and shape of your heart and how well your heart's chambers and valves are working. This procedure takes approximately one hour. There are no restrictions for this procedure.    Follow-Up: At [facility], you and your health needs are our priority.  As part of our continuing mission to provide you with exceptional heart care, we have created designated Provider Care Teams.  These Care Teams include your primary Cardiologist (physician) and Advanced Practice Providers (APPs -  Physician Assistants and Nurse Practitioners) who all work together to provide you with the care you need, when you need it.  We recommend signing up for the patient portal called "MyChart".  Sign up information is provided on this After Visit Summary.  MyChart is used to connect with patients for Virtual Visits (Telemedicine).  Patients are able to view lab/test results, encounter notes, upcoming appointments, etc.  Non-urgent messages can be sent to your provider as well.   To learn more about what you can do with MyChart, go to [email or web address].    Your next appointment:   3 month(s)  The format for your next appointment:   In Person  Provider:   You may see Nelva Bush, MD or one of the following Advanced Practice Providers on your designated Care Team:   Gerrie Nordmann, NP    Important Information About Sugar

## 2022-05-14 ENCOUNTER — Ambulatory Visit (INDEPENDENT_AMBULATORY_CARE_PROVIDER_SITE_OTHER): Payer: PPO | Admitting: Family

## 2022-05-14 ENCOUNTER — Encounter: Payer: Self-pay | Admitting: Family

## 2022-05-14 VITALS — BP 136/80 | HR 77 | Temp 98.7°F | Resp 16 | Ht 65.5 in | Wt 179.1 lb

## 2022-05-14 DIAGNOSIS — R748 Abnormal levels of other serum enzymes: Secondary | ICD-10-CM | POA: Diagnosis not present

## 2022-05-14 DIAGNOSIS — M6289 Other specified disorders of muscle: Secondary | ICD-10-CM | POA: Diagnosis not present

## 2022-05-14 DIAGNOSIS — E538 Deficiency of other specified B group vitamins: Secondary | ICD-10-CM

## 2022-05-14 DIAGNOSIS — I10 Essential (primary) hypertension: Secondary | ICD-10-CM | POA: Diagnosis not present

## 2022-05-14 DIAGNOSIS — I700 Atherosclerosis of aorta: Secondary | ICD-10-CM | POA: Diagnosis not present

## 2022-05-14 DIAGNOSIS — E7849 Other hyperlipidemia: Secondary | ICD-10-CM | POA: Insufficient documentation

## 2022-05-14 DIAGNOSIS — E782 Mixed hyperlipidemia: Secondary | ICD-10-CM | POA: Diagnosis not present

## 2022-05-14 DIAGNOSIS — E7801 Familial hypercholesterolemia: Secondary | ICD-10-CM

## 2022-05-14 DIAGNOSIS — R0609 Other forms of dyspnea: Secondary | ICD-10-CM

## 2022-05-14 DIAGNOSIS — K2960 Other gastritis without bleeding: Secondary | ICD-10-CM | POA: Diagnosis not present

## 2022-05-14 DIAGNOSIS — R7989 Other specified abnormal findings of blood chemistry: Secondary | ICD-10-CM | POA: Insufficient documentation

## 2022-05-14 LAB — MAGNESIUM: Magnesium: 2.1 mg/dL (ref 1.5–2.5)

## 2022-05-14 LAB — VITAMIN B12: Vitamin B-12: >1500 pg/mL — ABNORMAL HIGH (ref 211–911)

## 2022-05-14 LAB — LIPID PANEL
Cholesterol: 151 mg/dL (ref 0–200)
HDL: 50.9 mg/dL (ref >39.00)
LDL Cholesterol: 62 mg/dL (ref 0–99)
NonHDL: 99.78
Total CHOL/HDL Ratio: 3
Triglycerides: 190 mg/dL — ABNORMAL HIGH (ref 0.0–149.0)
VLDL: 38 mg/dL (ref 0.0–40.0)

## 2022-05-14 LAB — CK: Total CK: 77 U/L (ref 7–177)

## 2022-05-14 LAB — BRAIN NATRIURETIC PEPTIDE: Pro B Natriuretic peptide (BNP): 25 pg/mL (ref 0.0–100.0)

## 2022-05-14 MED ORDER — OMEPRAZOLE 20 MG PO CPDR
DELAYED_RELEASE_CAPSULE | ORAL | 0 refills | Status: DC
Start: 2022-05-14 — End: 2022-11-28

## 2022-05-14 MED ORDER — LISINOPRIL 20 MG PO TABS: 20.0000 mg | ORAL_TABLET | Freq: Every day | ORAL | 3 refills | Status: DC

## 2022-05-14 NOTE — Assessment & Plan Note (Signed)
tsh negative No longer on statin Ordering ck and mag

## 2022-05-14 NOTE — Assessment & Plan Note (Signed)
Off of all supplements, ordering b12 today. Pending results

## 2022-05-14 NOTE — Patient Instructions (Addendum)
  Increase lisinopril to 20 mg once daily Start baby aspirin 81 mg once daily.   Look into mail order pharmacy, I think it should be [facility]. Let me know via mychart.   Stop by the lab prior to leaving today. I will notify you of your results once received.   Due to recent changes in healthcare laws, you may see results of your imaging and/or laboratory studies on MyChart before I have had a chance to review them.  I understand that in some cases there may be results that are confusing or concerning to you. Please understand that not all results are received at the same time and often I may need to interpret multiple results in order to provide you with the best plan of care or course of treatment. Therefore, I ask that you please give me 2 business days to thoroughly review all your results before contacting my office for clarification. Should we see a critical lab result, you will be contacted sooner.   It was a pleasure seeing you today! Please do not hesitate to reach out with any questions and or concerns.  Regards,   [name] FNP-C

## 2022-05-14 NOTE — Assessment & Plan Note (Signed)
Increase lisinopril to 20 mg once daily. Pt advised of the following:  Continue medication as prescribed. Monitor blood pressure periodically and/or when you feel symptomatic. Goal is <130/90 on average. Ensure that you have rested for 30 minutes prior to checking your blood pressure. Record your readings and bring them to your next visit if necessary.work on a low sodium diet.

## 2022-05-14 NOTE — Assessment & Plan Note (Signed)
Cont f/u with cardiology as scheduled Work on low chol diet continue repatha

## 2022-05-14 NOTE — Assessment & Plan Note (Signed)
Start daily low dose asa for increased ASCVD risk

## 2022-05-14 NOTE — Progress Notes (Signed)
Established Patient Office Visit  Subjective:  Patient ID: [name], female    DOB: 25-Sep-1955  Age: 66 y.o. MRN: [id number]  CC:  Chief Complaint  Patient presents with   Medication Refill    HPI [name] is here today for follow up.   Pt is with acute concerns.  Hyperlipidemia: recently saw cardiology, seen with Gerrie Nordmann, NP. Here to check her cholesterol today. She was recently started on repatha injection, twice monthly. Cardiology suggested low dose asa due to elevated ASCVD risk with aortic atherosclerosis.   HTN: on lisinopril 10 mg daily. She brought her average blood pressure log with her, average is 140/80.  Lowest 118/80 highest 156/84.   DOE: cardiology ordered echo , scheduled for 9/12.   Has muscle fatigue, at times with muscle weakness especially upon standing up. Denies stiffness in bones.   Past Medical History:  Diagnosis Date   Anemia    Anxiety    Blood transfusion without reported diagnosis    Breast cancer ([place]) 02/27/2007   Right   COVID-19 08/30/2020   GERD (gastroesophageal reflux disease)    Hypercholesterolemia    Hypertension    Migraine    Hx of in past   Personal history of radiation therapy 2008   Right   Wears dentures    partial upper and lower    Past Surgical History:  Procedure Laterality Date   ABDOMINAL HYSTERECTOMY     BREAST BIOPSY Right 02/27/2007   BREAST LUMPECTOMY Right 04/02/2007   BREAST SURGERY  01/14/2013   breast reduction left side   BROW LIFT Bilateral 06/17/2017   Procedure: BLEPHAROPLASTY UPPER EYELID WITH EXCESS SKIN;  Surgeon: Karle Starch, MD;  Location: [place];  Service: Ophthalmology;  Laterality: Bilateral;   BROW LIFT Bilateral 11/30/2021   Procedure: BROW PTOSIS REPAIR BILATERAL;  Surgeon: Karle Starch, MD;  Location: [place];  Service: Ophthalmology;  Laterality: Bilateral;   CESAREAN SECTION     two times   COLONOSCOPY     FOOT SURGERY Right    PTOSIS  REPAIR Bilateral 06/17/2017   Procedure: PTOSIS REPAIR RESECT EX;  Surgeon: Karle Starch, MD;  Location: [place];  Service: Ophthalmology;  Laterality: Bilateral;   REDUCTION MAMMAPLASTY Left    SHOULDER ARTHROSCOPY WITH ROTATOR CUFF REPAIR AND OPEN BICEPS TENODESIS Right 02/01/2019   Procedure: RIGHT SHOULDER ARTHROSCOPY, DEBRIDEMENT, BICEPS TENODESIS, MINI ROTATOR CUFF TEAR REPAIR;  Surgeon: Meredith Pel, MD;  Location: [place];  Service: Orthopedics;  Laterality: Right;    Family History  Problem Relation Age of Onset   Thyroid disease Mother    Hypertension Mother    Hyperlipidemia Mother    Alzheimer's disease Mother    Heart failure Mother    Esophageal cancer Father    Hyperlipidemia Father    Heart murmur Father    Colon polyps Father    COPD Father    Aneurysm Father    Diabetes Sister    Breast cancer Sister    Irritable bowel syndrome Sister    Ulcerative colitis Sister    Rheum arthritis Sister    Alzheimer's disease Maternal Aunt    Alzheimer's disease Maternal Uncle    Diabetes Paternal Uncle    Alzheimer's disease Maternal Grandmother    Diabetes Paternal Grandmother    Breast cancer Cousin    Breast cancer Cousin    Colon cancer Neg Hx    Stomach cancer Neg Hx  Rectal cancer Neg Hx     Social History   Socioeconomic History   Marital status: Married    Spouse name: [name]   Number of children: 2   Years of education: high school   Highest education level: Not on file  Occupational History   Not on file  Tobacco Use   Smoking status: Never   Smokeless tobacco: Never  Vaping Use   Vaping Use: Never used  Substance and Sexual Activity   Alcohol use: Not Currently   Drug use: No   Sexual activity: Yes    Partners: Male    Birth control/protection: Surgical  Other Topics Concern   Not on file  Social History Narrative   01/25/20   From: the area   Living: with husband, [name] (1980) and her mother   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      Family: 2 daughters - [name] and [name] - 3 grandchildren      Enjoys: boating, walking, beach, fishing, outdoor activity      Exercise: keeps busy moving around the house, irregular   Diet: chicken, some read meat, veggies, salads - trying to limit sugar      Safety   Seat belts: Yes    Guns: Yes  and secure   Safe in relationships: Yes    Social Determinants of Radio broadcast assistant Strain: Not on file  Food Insecurity: Not on file  Transportation Needs: Not on file  Physical Activity: Not on file  Stress: Not on file  Social Connections: Not on file  Intimate Partner Violence: Not on file    Outpatient Medications Prior to Visit  Medication Sig Dispense Refill   azelastine (ASTELIN) 0.1 % nasal spray SPRAY 1-2 SPRAYS IN EACH NOSTRIL TWICE DAILY.     escitalopram (LEXAPRO) 20 MG tablet TAKE 1 TABLET BY MOUTH EVERY DAY 90 tablet 2   Evolocumab (REPATHA SURECLICK) 324 MG/ML SOAJ Inject 1 mL into the skin every 14 (fourteen) days. 2 mL 3   fluticasone (FLONASE) 50 MCG/ACT nasal spray Place 2 sprays into both nostrils daily. 16 g 6   ibuprofen (ADVIL) 800 MG tablet Take 1 tablet (800 mg total) by mouth every 8 (eight) hours as needed. 90 tablet 0   Multiple Vitamin (MULTIVITAMIN) tablet Take 1 tablet by mouth daily.     Omega-3 Fatty Acids (OMEGA 3 500 PO) Take by mouth.     lisinopril (ZESTRIL) 10 MG tablet Take 1 tablet (10 mg total) by mouth daily. 90 tablet 3   omeprazole (PRILOSEC) 20 MG capsule TAKE 1 CAPSULE BY MOUTH EVERY DAY 90 capsule 1   traMADol (ULTRAM) 50 MG tablet Take 1 every 4-6 hours as needed for pain not controlled by Tylenol (Patient not taking: Reported on 05/14/2022) 6 tablet 0   No facility-administered medications prior to visit.    Allergies  Allergen Reactions   Ezetimibe Other (See Comments)    Fatigue and constipation   Statins Other (See Comments)    Muscle aches    Losartan Other (See Comments)    Fatigue, malaise         Objective:    Physical Exam Constitutional:      Appearance: Normal appearance. She is normal weight.  Cardiovascular:     Rate and Rhythm: Normal rate and regular rhythm.  Pulmonary:     Effort: Pulmonary effort is normal.  Neurological:     General: No focal  deficit present.     Mental Status: She is alert and oriented to person, place, and time.  Psychiatric:        Mood and Affect: Mood normal.        Behavior: Behavior normal.        Thought Content: Thought content normal.        Judgment: Judgment normal.       BP 136/80   Pulse 77   Temp 98.7 F (37.1 C)   Resp 16   Ht 5' 5.5" (1.664 m)   Wt 179 lb 2 oz (81.3 kg)   SpO2 97%   BMI 29.35 kg/m  Wt Readings from Last 3 Encounters:  05/14/22 179 lb 2 oz (81.3 kg)  05/08/22 178 lb 6.4 oz (80.9 kg)  03/28/22 178 lb 4 oz (80.9 kg)     Health Maintenance Due  Topic Date Due   Hepatitis C Screening  Never done   Zoster Vaccines- Shingrix (1 of 2) Never done   Fecal DNA (Cologuard)  06/17/2021   DEXA SCAN  08/29/2021    There are no preventive care reminders to display for this patient.  Lab Results  Component Value Date   TSH 1.71 03/28/2022   Lab Results  Component Value Date   WBC 4.7 10/01/2021   HGB 12.8 10/01/2021   HCT 39.6 10/01/2021   MCV 85.6 10/01/2021   PLT 253.0 10/01/2021   Lab Results  Component Value Date   NA 138 01/11/2022   K 4.1 01/11/2022   CO2 28 01/11/2022   GLUCOSE 100 (H) 01/11/2022   BUN 16 01/11/2022   CREATININE 0.72 01/11/2022   BILITOT 0.5 01/11/2022   ALKPHOS 67 01/11/2022   AST 22 01/11/2022   ALT 18 01/11/2022   PROT 7.5 01/11/2022   ALBUMIN 4.0 01/11/2022   CALCIUM 9.0 01/11/2022   ANIONGAP 9 01/11/2022   GFR 91.29 10/01/2021   Lab Results  Component Value Date   CHOL 303 (H) 03/28/2022   Lab Results  Component Value Date   HDL 55.80 03/28/2022   Lab Results  Component Value Date    LDLCALC 213 (H) 03/28/2022   Lab Results  Component Value Date   TRIG 172.0 (H) 03/28/2022   Lab Results  Component Value Date   CHOLHDL 5 03/28/2022   Lab Results  Component Value Date   HGBA1C 6.4 09/27/2020      Assessment & Plan:   Problem List Items Addressed This Visit       Cardiovascular and Mediastinum   Aortic atherosclerosis ([place]) - Primary    Start daily low dose asa for increased ASCVD risk        Relevant Medications   lisinopril (ZESTRIL) 20 MG tablet   Other Relevant Orders   Lipid panel   Primary hypertension    Increase lisinopril to 20 mg once daily. Pt advised of the following:  Continue medication as prescribed. Monitor blood pressure periodically and/or when you feel symptomatic. Goal is <130/90 on average. Ensure that you have rested for 30 minutes prior to checking your blood pressure. Record your readings and bring them to your next visit if necessary.work on a low sodium diet.       Relevant Medications   lisinopril (ZESTRIL) 20 MG tablet     Digestive   Reflux gastritis    D/w pt risk of long term ppi  She uses for small periods, discussed with her to use as needed for two week periods.  Try to decrease and or avoid spicy foods, fried fatty foods, and also caffeine and chocolate as these can increase heartburn symptoms.        Relevant Medications   omeprazole (PRILOSEC) 20 MG capsule   lisinopril (ZESTRIL) 20 MG tablet     Other   Heterozygous familial hypercholesterolemia    Cont f/u with cardiology as scheduled Work on low chol diet continue repatha      Relevant Medications   lisinopril (ZESTRIL) 20 MG tablet   Peripheral muscle fatigue    tsh negative No longer on statin Ordering ck and mag      Relevant Orders   CK   Magnesium   Mixed hyperlipidemia   Relevant Medications   lisinopril (ZESTRIL) 20 MG tablet   Other Relevant Orders   Lipid panel   Elevated vitamin B12 level    Off of all supplements,  ordering b12 today. Pending results      Relevant Orders   Vitamin B12   Other Visit Diagnoses     DOE (dyspnea on exertion)       Relevant Orders   Brain natriuretic peptide       Meds ordered this encounter  Medications   omeprazole (PRILOSEC) 20 MG capsule    Sig: TAKE 1 CAPSULE BY MOUTH EVERY DAY    Dispense:  90 capsule    Refill:  0   lisinopril (ZESTRIL) 20 MG tablet    Sig: Take 1 tablet (20 mg total) by mouth daily.    Dispense:  90 tablet    Refill:  3    Order Specific Question:   Supervising Provider    Answer:   BEDSOLE, AMY E [[id number]]    Follow-up: No follow-ups on file.    [name], FNP

## 2022-05-14 NOTE — Assessment & Plan Note (Signed)
D/w pt risk of long term ppi  She uses for small periods, discussed with her to use as needed for two week periods.  Try to decrease and or avoid spicy foods, fried fatty foods, and also caffeine and chocolate as these can increase heartburn symptoms.

## 2022-05-15 ENCOUNTER — Encounter: Payer: Self-pay | Admitting: Family

## 2022-05-16 ENCOUNTER — Encounter: Payer: Self-pay | Admitting: Family

## 2022-05-28 ENCOUNTER — Ambulatory Visit: Payer: PPO | Attending: Cardiology

## 2022-05-28 DIAGNOSIS — R0602 Shortness of breath: Secondary | ICD-10-CM

## 2022-05-28 LAB — ECHOCARDIOGRAM COMPLETE
AR max vel: 2.16 cm2
AV Area VTI: 2.28 cm2
AV Area mean vel: 2.03 cm2
AV Mean grad: 3 mmHg
AV Peak grad: 5.2 mmHg
Ao pk vel: 1.14 m/s
Area-P 1/2: 3.97 cm2
Calc EF: 57 %
S' Lateral: 3.2 cm
Single Plane A2C EF: 51.6 %
Single Plane A4C EF: 59.9 %

## 2022-05-28 NOTE — Addendum Note (Signed)
Addended by: [name] on: 05/28/2022 12:57 PM   Modules accepted: Orders

## 2022-05-29 ENCOUNTER — Encounter: Payer: Self-pay | Admitting: Family

## 2022-05-29 ENCOUNTER — Telehealth: Payer: Self-pay

## 2022-05-29 ENCOUNTER — Encounter: Payer: Self-pay | Admitting: Internal Medicine

## 2022-05-29 ENCOUNTER — Ambulatory Visit (INDEPENDENT_AMBULATORY_CARE_PROVIDER_SITE_OTHER): Payer: PPO | Admitting: Family

## 2022-05-29 ENCOUNTER — Ambulatory Visit: Payer: PPO | Attending: Internal Medicine | Admitting: Internal Medicine

## 2022-05-29 VITALS — BP 140/80 | HR 71 | Ht 65.5 in | Wt 175.0 lb

## 2022-05-29 VITALS — BP 132/76 | HR 75 | Temp 98.6°F | Resp 16 | Ht 65.5 in | Wt 177.0 lb

## 2022-05-29 DIAGNOSIS — D509 Iron deficiency anemia, unspecified: Secondary | ICD-10-CM | POA: Diagnosis not present

## 2022-05-29 DIAGNOSIS — R5382 Chronic fatigue, unspecified: Secondary | ICD-10-CM | POA: Insufficient documentation

## 2022-05-29 DIAGNOSIS — E782 Mixed hyperlipidemia: Secondary | ICD-10-CM | POA: Diagnosis not present

## 2022-05-29 DIAGNOSIS — I700 Atherosclerosis of aorta: Secondary | ICD-10-CM | POA: Diagnosis not present

## 2022-05-29 DIAGNOSIS — R232 Flushing: Secondary | ICD-10-CM | POA: Diagnosis not present

## 2022-05-29 DIAGNOSIS — R7989 Other specified abnormal findings of blood chemistry: Secondary | ICD-10-CM | POA: Diagnosis not present

## 2022-05-29 DIAGNOSIS — E7849 Other hyperlipidemia: Secondary | ICD-10-CM

## 2022-05-29 DIAGNOSIS — R748 Abnormal levels of other serum enzymes: Secondary | ICD-10-CM

## 2022-05-29 DIAGNOSIS — Z789 Other specified health status: Secondary | ICD-10-CM

## 2022-05-29 LAB — IBC + FERRITIN
Ferritin: 63.3 ng/mL (ref 10.0–291.0)
Iron: 105 ug/dL (ref 42–145)
Saturation Ratios: 31.9 % (ref 20.0–50.0)
TIBC: 329 ug/dL (ref 250.0–450.0)
Transferrin: 235 mg/dL (ref 212.0–360.0)

## 2022-05-29 LAB — FOLLICLE STIMULATING HORMONE: FSH: 50.1 m[IU]/mL

## 2022-05-29 NOTE — Patient Instructions (Signed)
Medication Instructions:  Your physician recommends that you continue on your current medications as directed. Please refer to the Current Medication list given to you today.  *If you need a refill on your cardiac medications before your next appointment, please call your pharmacy*   Lab Work: Lipid panel Today at PCP office.  If you have labs (blood work) drawn today and your tests are completely normal, you will receive your results only by: [place] (if you have MyChart) OR A paper copy in the mail If you have any lab test that is abnormal or we need to change your treatment, we will call you to review the results.   Testing/Procedures: Genetic test for FH ordered ([facility]) Cheek swab will be complete at the office [facility] or [facility], check time available with the patient Specimen and necessary paperwork mailed.   Follow-Up: At [facility], you and your health needs are our priority.  As part of our continuing mission to provide you with exceptional heart care, we have created designated Provider Care Teams.  These Care Teams include your primary Cardiologist (physician) and Advanced Practice Providers (APPs -  Physician Assistants and Nurse Practitioners) who all work together to provide you with the care you need, when you need it.  We recommend signing up for the patient portal called "MyChart".  Sign up information is provided on this After Visit Summary.  MyChart is used to connect with patients for Virtual Visits (Telemedicine).  Patients are able to view lab/test results, encounter notes, upcoming appointments, etc.  Non-urgent messages can be sent to your provider as well.   To learn more about what you can do with MyChart, go to [email or web address].    Your next appointment:   As needed with Dr. Debara Pickett

## 2022-05-29 NOTE — Assessment & Plan Note (Signed)
Request from lipid specialist to lipoprotein A drawn we will draw today and CC to ordering provider.

## 2022-05-29 NOTE — Telephone Encounter (Signed)
I connected with  [name] on 05/29/22 by a video enabled telemedicine application and verified that I am speaking with the correct person using two identifiers.   I discussed the limitations of evaluation and management by telemedicine. The patient expressed understanding and agreed to proceed.

## 2022-05-29 NOTE — Progress Notes (Signed)
Established Patient Office Visit  Subjective:  Patient ID: [name], female    DOB: [birth year]  Age: 66 y.o. MRN: [id number]  CC:  Chief Complaint  Patient presents with   Hyperlipidemia    HPI [name] is here today for follow up.   Pt is with acute concerns.  Vitamin B12 increased: pt has been concerned as not supplementing with any B12 supplementation and has even tried to stay away from higher B12 food in diet. She is concerned by what she has heard from family members.   Still with chronic fatigue, still worrying  about this and curious if we can test for hormones and or adrenal fatigue. She has a nice that recently was tested for adrenal fatigue and had problems with this.   Lmp was in her 20's as she had a partial hysterectomy. Age 20 was placed on tamoxifen which put her into menopause. Pt denies craving for salt.   Past Medical History:  Diagnosis Date   Anemia    Anxiety    Blood transfusion without reported diagnosis    Breast cancer ([place]) 02/27/2007   Right   COVID-19 08/30/2020   GERD (gastroesophageal reflux disease)    Hypercholesterolemia    Hypertension    Migraine    Hx of in past   Personal history of radiation therapy 2008   Right   Wears dentures    partial upper and lower    Past Surgical History:  Procedure Laterality Date   ABDOMINAL HYSTERECTOMY     BREAST BIOPSY Right 02/27/2007   BREAST LUMPECTOMY Right 04/02/2007   BREAST SURGERY  01/14/2013   breast reduction left side   BROW LIFT Bilateral 06/17/2017   Procedure: BLEPHAROPLASTY UPPER EYELID WITH EXCESS SKIN;  Surgeon: Karle Starch, MD;  Location: [facility];  Service: Ophthalmology;  Laterality: Bilateral;   BROW LIFT Bilateral 11/30/2021   Procedure: BROW PTOSIS REPAIR BILATERAL;  Surgeon: Karle Starch, MD;  Location: [place] [facility];  Service: Ophthalmology;  Laterality: Bilateral;   CESAREAN SECTION     two times   COLONOSCOPY     FOOT SURGERY Right     PTOSIS REPAIR Bilateral 06/17/2017   Procedure: PTOSIS REPAIR RESECT EX;  Surgeon: Karle Starch, MD;  Location: [facility];  Service: Ophthalmology;  Laterality: Bilateral;   REDUCTION MAMMAPLASTY Left    SHOULDER ARTHROSCOPY WITH ROTATOR CUFF REPAIR AND OPEN BICEPS TENODESIS Right 02/01/2019   Procedure: RIGHT SHOULDER ARTHROSCOPY, DEBRIDEMENT, BICEPS TENODESIS, MINI ROTATOR CUFF TEAR REPAIR;  Surgeon: Meredith Pel, MD;  Location: [facility];  Service: Orthopedics;  Laterality: Right;    Family History  Problem Relation Age of Onset   Thyroid disease Mother    Hypertension Mother    Hyperlipidemia Mother    Alzheimer's disease Mother    Heart failure Mother    Esophageal cancer Father    Hyperlipidemia Father    Heart murmur Father    Colon polyps Father    COPD Father    Aneurysm Father    Diabetes Sister    Breast cancer Sister    Irritable bowel syndrome Sister    Ulcerative colitis Sister    Rheum arthritis Sister    Alzheimer's disease Maternal Aunt    Alzheimer's disease Maternal Uncle    Diabetes Paternal Uncle    Alzheimer's disease Maternal Grandmother    Diabetes Paternal Grandmother    Breast cancer Cousin    Breast cancer Cousin  Colon cancer Neg Hx    Stomach cancer Neg Hx    Rectal cancer Neg Hx     Social History   Socioeconomic History   Marital status: Married    Spouse name: [name]   Number of children: 2   Years of education: high school   Highest education level: Not on file  Occupational History   Not on file  Tobacco Use   Smoking status: Never   Smokeless tobacco: Never  Vaping Use   Vaping Use: Never used  Substance and Sexual Activity   Alcohol use: Not Currently   Drug use: No   Sexual activity: Yes    Partners: Male    Birth control/protection: Surgical  Other Topics Concern   Not on file  Social History Narrative   01/25/20   From: the area   Living: with husband, [name] (1980) and her mother    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      Family: 2 daughters - [name] and [name] - 3 grandchildren      Enjoys: boating, walking, beach, fishing, outdoor activity      Exercise: keeps busy moving around the house, irregular   Diet: chicken, some read meat, veggies, salads - trying to limit sugar      Safety   Seat belts: Yes    Guns: Yes  and secure   Safe in relationships: Yes    Social Determinants of Radio broadcast assistant Strain: Not on file  Food Insecurity: Not on file  Transportation Needs: Not on file  Physical Activity: Not on file  Stress: Not on file  Social Connections: Not on file  Intimate Partner Violence: Not on file    Outpatient Medications Prior to Visit  Medication Sig Dispense Refill   azelastine (ASTELIN) 0.1 % nasal spray SPRAY 1-2 SPRAYS IN EACH NOSTRIL TWICE DAILY.     escitalopram (LEXAPRO) 20 MG tablet TAKE 1 TABLET BY MOUTH EVERY DAY 90 tablet 2   Evolocumab (REPATHA SURECLICK) 856 MG/ML SOAJ Inject 1 mL into the skin every 14 (fourteen) days. 2 mL 3   fluticasone (FLONASE) 50 MCG/ACT nasal spray Place 2 sprays into both nostrils daily. 16 g 6   ibuprofen (ADVIL) 800 MG tablet Take 1 tablet (800 mg total) by mouth every 8 (eight) hours as needed. 90 tablet 0   lisinopril (ZESTRIL) 20 MG tablet Take 1 tablet (20 mg total) by mouth daily. 90 tablet 3   Multiple Vitamin (MULTIVITAMIN) tablet Take 1 tablet by mouth daily.     Omega-3 Fatty Acids (OMEGA 3 500 PO) Take by mouth.     omeprazole (PRILOSEC) 20 MG capsule TAKE 1 CAPSULE BY MOUTH EVERY DAY 90 capsule 0   No facility-administered medications prior to visit.    Allergies  Allergen Reactions   Ezetimibe Other (See Comments)    Fatigue and constipation   Statins Other (See Comments)    Muscle aches   Losartan Other (See Comments)    Fatigue, malaise         Objective:    Physical Exam Constitutional:      Appearance: Normal  appearance. She is normal weight.  Pulmonary:     Effort: Pulmonary effort is normal.  Neurological:     General: No focal deficit present.     Mental Status: She is alert and oriented to person, place, and time. Mental status is at baseline.  Psychiatric:  Mood and Affect: Mood normal.        Behavior: Behavior normal.        Thought Content: Thought content normal.        Judgment: Judgment normal.       BP 132/76   Pulse 75   Temp 98.6 F (37 C)   Resp 16   Ht 5' 5.5" (1.664 m)   Wt 177 lb (80.3 kg)   SpO2 96%   BMI 29.01 kg/m  Wt Readings from Last 3 Encounters:  05/29/22 177 lb (80.3 kg)  05/29/22 175 lb (79.4 kg)  05/14/22 179 lb 2 oz (81.3 kg)     Health Maintenance Due  Topic Date Due   Hepatitis C Screening  Never done   Zoster Vaccines- Shingrix (1 of 2) Never done   Fecal DNA (Cologuard)  06/17/2021   DEXA SCAN  08/29/2021    There are no preventive care reminders to display for this patient.  Lab Results  Component Value Date   TSH 1.71 03/28/2022   Lab Results  Component Value Date   WBC 4.7 10/01/2021   HGB 12.8 10/01/2021   HCT 39.6 10/01/2021   MCV 85.6 10/01/2021   PLT 253.0 10/01/2021   Lab Results  Component Value Date   NA 138 01/11/2022   K 4.1 01/11/2022   CO2 28 01/11/2022   GLUCOSE 100 (H) 01/11/2022   BUN 16 01/11/2022   CREATININE 0.72 01/11/2022   BILITOT 0.5 01/11/2022   ALKPHOS 67 01/11/2022   AST 22 01/11/2022   ALT 18 01/11/2022   PROT 7.5 01/11/2022   ALBUMIN 4.0 01/11/2022   CALCIUM 9.0 01/11/2022   ANIONGAP 9 01/11/2022   GFR 91.29 10/01/2021   Lab Results  Component Value Date   CHOL 151 05/14/2022   Lab Results  Component Value Date   HDL 50.90 05/14/2022   Lab Results  Component Value Date   LDLCALC 62 05/14/2022   Lab Results  Component Value Date   TRIG 190.0 (H) 05/14/2022   Lab Results  Component Value Date   CHOLHDL 3 05/14/2022   Lab Results  Component Value Date   HGBA1C  6.4 09/27/2020      Assessment & Plan:   Problem List Items Addressed This Visit       Cardiovascular and Mediastinum   Aortic atherosclerosis ([place])   Hot flashes    TSH stable will order prolactin and FSH today.      Relevant Orders   Follicle stimulating hormone   Prolactin     Other   Mixed hyperlipidemia    Request from lipid specialist to lipoprotein A drawn we will draw today and CC to ordering provider.      Relevant Orders   Lipoprotein A (LPA)   Elevated vitamin B12 level    Did refer pt to hematology.  Also let pt know that liver function hs been stable as well as recent visual on ct abd pelvis liver without acute findings. Kidney function also stable and not concerning.       Iron deficiency anemia   Relevant Orders   IBC + Ferritin   Chronic fatigue    Patient is still with chronic fatigue TSH has been normal CBC normal.  She does report history of anemia so we will assess iron panel as well as ferritin per her request.  Ordering a few hormone test such as prolactin and follicle-stimulating hormone to rule out other causes and determine if related to  post menopause.  Pending results to determine course of treatment plan      RESOLVED: High serum vitamin B12 - Primary    No orders of the defined types were placed in this encounter.   Follow-up: No follow-ups on file.    [name], FNP

## 2022-05-29 NOTE — Assessment & Plan Note (Signed)
Patient is still with chronic fatigue TSH has been normal CBC normal.  She does report history of anemia so we will assess iron panel as well as ferritin per her request.  Ordering a few hormone test such as prolactin and follicle-stimulating hormone to rule out other causes and determine if related to post menopause.  Pending results to determine course of treatment plan

## 2022-05-29 NOTE — Assessment & Plan Note (Signed)
TSH stable will order prolactin and FSH today.

## 2022-05-29 NOTE — Progress Notes (Signed)
Virtual Visit via Video Note   Because of [name]'s co-morbid illnesses, she is at least at moderate risk for complications without adequate follow up.  This format is felt to be most appropriate for this patient at this time.  All issues noted in this document were discussed and addressed.  A limited physical exam was performed with this format.  Please refer to the patient's chart for her consent to telehealth for [facility].      Date:  05/29/2022   ID:  [name], DOB Sep 16, 1956, MRN [id number] The patient was identified using 2 identifiers.  Evaluation Performed:  New Patient Evaluation  Patient Location:  [place] [address]  Provider location:   [address], [place],  [address]  PCP:  Leah Pancoast, FNP  Cardiologist:  Leah Bush, MD Electrophysiologist:  None   Chief Complaint: Familial hyperlipidemia  History of Present Illness:    [name] is a 66 y.o. female who presents via audio/video conferencing for a telehealth visit today.  This is a pleasant female with a history of high cholesterol concerning for familial hyperlipidemia.  She reports 2 sisters with high cholesterol which are both younger including one who has had a number of cardiovascular disease issues.  Her high cholesterol is longstanding but she has also been intolerant to statins having tried a number before and ezetimibe.  The these cause muscle aches.  Subsequently she was seen by Dr. Harrell Gave Cohen, who started her on Repatha.  She reports that this is much better tolerated although she has had some fatigue which last several days after the injection, but it is tolerable.  She has had a marked improvement in her lipids, having already had 4 doses.  Her cholesterol total 3 months ago was 303, triglycerides 172, HDL 55 and LDL 213.  Now, her repeat cholesterol testing shows total 151, triglycerides 190, HDL 50 and LDL 62.  Fortunately,  she had no coronary calcium by calcium scoring in 2019, however she does have some aortic atherosclerosis seen on abdominal CT imaging.  She does report that she has participated in an Alzheimer's study due to some family history of this in her grandmother.  They have previously told her that she has an APO E4 variant, which likely explains her high cholesterol.  She may have other mutations as well including possibility of elevated LP(a).  We discussed genetic testing today and she was interested in that.   Prior CV studies:   The following studies were reviewed today:  Lab work, chart reviewed  PMHx:  Past Medical History:  Diagnosis Date   Anemia    Anxiety    Blood transfusion without reported diagnosis    Breast cancer ([place]) 02/27/2007   Right   COVID-19 08/30/2020   GERD (gastroesophageal reflux disease)    Hypercholesterolemia    Hypertension    Migraine    Hx of in past   Personal history of radiation therapy 2008   Right   Wears dentures    partial upper and lower    Past Surgical History:  Procedure Laterality Date   ABDOMINAL HYSTERECTOMY     BREAST BIOPSY Right 02/27/2007   BREAST LUMPECTOMY Right 04/02/2007   BREAST SURGERY  01/14/2013   breast reduction left side   BROW LIFT Bilateral 06/17/2017   Procedure: BLEPHAROPLASTY UPPER EYELID WITH EXCESS SKIN;  Surgeon: Karle Starch, MD;  Location: [place];  Service: Ophthalmology;  Laterality: Bilateral;   BROW LIFT Bilateral 11/30/2021   Procedure: BROW PTOSIS REPAIR BILATERAL;  Surgeon: Karle Starch, MD;  Location: [facility];  Service: Ophthalmology;  Laterality: Bilateral;   CESAREAN SECTION     two times   COLONOSCOPY     FOOT SURGERY Right    PTOSIS REPAIR Bilateral 06/17/2017   Procedure: PTOSIS REPAIR RESECT EX;  Surgeon: Karle Starch, MD;  Location: [facility];  Service: Ophthalmology;  Laterality: Bilateral;   REDUCTION MAMMAPLASTY Left    SHOULDER ARTHROSCOPY WITH ROTATOR  CUFF REPAIR AND OPEN BICEPS TENODESIS Right 02/01/2019   Procedure: RIGHT SHOULDER ARTHROSCOPY, DEBRIDEMENT, BICEPS TENODESIS, MINI ROTATOR CUFF TEAR REPAIR;  Surgeon: Meredith Pel, MD;  Location: [facility];  Service: Orthopedics;  Laterality: Right;    FAMHx:  Family History  Problem Relation Age of Onset   Thyroid disease Mother    Hypertension Mother    Hyperlipidemia Mother    Alzheimer's disease Mother    Heart failure Mother    Esophageal cancer Father    Hyperlipidemia Father    Heart murmur Father    Colon polyps Father    COPD Father    Aneurysm Father    Diabetes Sister    Breast cancer Sister    Irritable bowel syndrome Sister    Ulcerative colitis Sister    Rheum arthritis Sister    Alzheimer's disease Maternal Aunt    Alzheimer's disease Maternal Uncle    Diabetes Paternal Uncle    Alzheimer's disease Maternal Grandmother    Diabetes Paternal Grandmother    Breast cancer Cousin    Breast cancer Cousin    Colon cancer Neg Hx    Stomach cancer Neg Hx    Rectal cancer Neg Hx     SOCHx:   reports that she has never smoked. She has never used smokeless tobacco. She reports that she does not currently use alcohol. She reports that she does not use drugs.  ALLERGIES:  Allergies  Allergen Reactions   Ezetimibe Other (See Comments)    Fatigue and constipation   Statins Other (See Comments)    Muscle aches   Losartan Other (See Comments)    Fatigue, malaise    MEDS:  Current Meds  Medication Sig   azelastine (ASTELIN) 0.1 % nasal spray SPRAY 1-2 SPRAYS IN EACH NOSTRIL TWICE DAILY.   escitalopram (LEXAPRO) 20 MG tablet TAKE 1 TABLET BY MOUTH EVERY DAY   Evolocumab (REPATHA SURECLICK) 469 MG/ML SOAJ Inject 1 mL into the skin every 14 (fourteen) days.   fluticasone (FLONASE) 50 MCG/ACT nasal spray Place 2 sprays into both nostrils daily.   ibuprofen (ADVIL) 800 MG tablet Take 1 tablet (800 mg total) by mouth every 8 (eight) hours as  needed.   lisinopril (ZESTRIL) 20 MG tablet Take 1 tablet (20 mg total) by mouth daily.   omeprazole (PRILOSEC) 20 MG capsule TAKE 1 CAPSULE BY MOUTH EVERY DAY     ROS: Pertinent items noted in HPI and remainder of comprehensive ROS otherwise negative.  Labs/Other Tests and Data Reviewed:    Recent Labs: 10/01/2021: Hemoglobin 12.8; Platelets 253.0 01/11/2022: ALT 18; BUN 16; Creatinine, Ser 0.72; Potassium 4.1; Sodium 138 03/28/2022: TSH 1.71 05/14/2022: Magnesium 2.1; Pro B Natriuretic peptide (BNP) 25.0   Recent Lipid Panel Lab Results  Component Value Date/Time   CHOL 151 05/14/2022 11:32 AM   TRIG 190.0 (H) 05/14/2022 11:32 AM   HDL 50.90 05/14/2022 11:32 AM   CHOLHDL  3 05/14/2022 11:32 AM   LDLCALC 62 05/14/2022 11:32 AM   LDLDIRECT 209.0 06/04/2019 09:39 AM    Wt Readings from Last 3 Encounters:  05/29/22 175 lb (79.4 kg)  05/14/22 179 lb 2 oz (81.3 kg)  05/08/22 178 lb 6.4 oz (80.9 kg)     Exam:    Vital Signs:  BP (!) 140/80   Pulse 71   Ht 5' 5.5" (1.664 m)   Wt 175 lb (79.4 kg)   SpO2 97%   BMI 28.68 kg/m    General appearance: alert and no distress Lungs: No wheezing Abdomen: Normal weight Extremities: extremities normal, atraumatic, no cyanosis or edema Skin: Skin color, texture, turgor normal. No rashes or lesions Neurologic: Grossly normal Psych: Pleasant  ASSESSMENT & PLAN:    Definite familial hyperlipidemia, LDL recently as high as 234 Multiple family members with LDL greater than 190 and cardiovascular disease Family history of Alzheimer's dementia with known APO E4 mutation Aortic atherosclerosis 0 coronary artery calcification (2019) Statin intolerant, ezetimibe intolerant-myalgias  Ms. Stoker has a definite familial hyperlipidemia with LDL as high as 234 untreated.  Unfortunately she could not tolerate statins or ezetimibe but has done well on Repatha with marked improvement in her lipids.  Other than some fatigue this has been  well-tolerated.  She told me that she had participated in an Alzheimer's study and was identified as having an APO E4 mutation.  Is not clear whether she is heterozygous or homozygous for this however I think would be beneficial to do additional genetic testing for lipids to see if there are other mutations that may be explaining her high cholesterol or it is mostly just attributable to this.  Fortunately she had no coronary artery calcification however I agree with her current therapy as she was noted to have some aortic atherosclerosis.  At present she is well treated.  Triglycerides remain elevated which is not surprising since there is little benefit Repatha with regards to triglycerides.  This is also a genetic triglyceride issue as well.  Since she has no prior heart or cardiovascular events such as a stroke or MI, I think there is little evidence to suggest adding Vascepa or other triglyceride lowering agents due to lack of cardiovascular risk reduction.  Would focus primarily on diet, physical activity and avoidance of triggers of high triglycerides.  We will plan an LP(a) which she will have drawn today at her primary care provider.  Also, we will arrange for genetic testing in our office in [place].  That result may take up to 2 to 3 weeks to get back and will cost $299.  She understands this is not likely to be covered by Medicare.  She is agreeable with that testing.  Thanks again for the kind referral.  She can follow-up with you in the office and I am happy to see her on an as-needed basis.  COVID-19 Education: The signs and symptoms of COVID-19 were discussed with the patient and how to seek care for testing (follow up with PCP or arrange E-visit).  The importance of social distancing was discussed today.  Patient Risk:   After full review of this patients clinical status, I feel that they are at least moderate risk at this time.  Time:   Today, I have spent 25 minutes with the patient  with telehealth technology discussing familial hyperlipidemia.     Medication Adjustments/Labs and Tests Ordered: Current medicines are reviewed at length with the patient today.  Concerns regarding  medicines are outlined above.   Tests Ordered: Orders Placed This Encounter  Procedures   Lipid panel    Medication Changes: No orders of the defined types were placed in this encounter.   Disposition:  prn  Pixie Casino, MD, FACC, [place] Director of the [facility]  [facility] Diplomate of the American Board of Clinical Lipidology Attending Cardiologist  Direct Dial: [phone]  Fax: [phone]  Website:  [email or web address]  Pixie Casino, MD  05/29/2022 9:08 AM

## 2022-05-29 NOTE — Assessment & Plan Note (Signed)
Did refer pt to hematology.  Also let pt know that liver function hs been stable as well as recent visual on ct abd pelvis liver without acute findings. Kidney function also stable and not concerning.

## 2022-05-31 LAB — PROLACTIN: Prolactin: 4 ng/mL

## 2022-05-31 LAB — LIPOPROTEIN A (LPA): Lipoprotein (a): <10 nmol/L (ref <75)

## 2022-06-03 NOTE — Progress Notes (Signed)
I was able to draw the lipoprotein for you. See attached for your review.

## 2022-06-05 DIAGNOSIS — H2513 Age-related nuclear cataract, bilateral: Secondary | ICD-10-CM | POA: Diagnosis not present

## 2022-06-11 ENCOUNTER — Encounter: Payer: Self-pay | Admitting: Internal Medicine

## 2022-06-11 ENCOUNTER — Inpatient Hospital Stay: Payer: PPO

## 2022-06-11 ENCOUNTER — Other Ambulatory Visit: Payer: Self-pay

## 2022-06-11 ENCOUNTER — Inpatient Hospital Stay: Payer: PPO | Attending: Internal Medicine | Admitting: Internal Medicine

## 2022-06-11 DIAGNOSIS — R7989 Other specified abnormal findings of blood chemistry: Secondary | ICD-10-CM | POA: Diagnosis not present

## 2022-06-11 DIAGNOSIS — R748 Abnormal levels of other serum enzymes: Secondary | ICD-10-CM

## 2022-06-11 DIAGNOSIS — Z86000 Personal history of in-situ neoplasm of breast: Secondary | ICD-10-CM | POA: Diagnosis not present

## 2022-06-11 DIAGNOSIS — Z923 Personal history of irradiation: Secondary | ICD-10-CM | POA: Diagnosis not present

## 2022-06-11 LAB — COMPREHENSIVE METABOLIC PANEL
ALT: 15 U/L (ref 0–44)
AST: 21 U/L (ref 15–41)
Albumin: 4 g/dL (ref 3.5–5.0)
Alkaline Phosphatase: 74 U/L (ref 38–126)
Anion gap: 5 (ref 5–15)
BUN: 14 mg/dL (ref 8–23)
CO2: 29 mmol/L (ref 22–32)
Calcium: 8.8 mg/dL — ABNORMAL LOW (ref 8.9–10.3)
Chloride: 105 mmol/L (ref 98–111)
Creatinine, Ser: 0.88 mg/dL (ref 0.44–1.00)
GFR, Estimated: >60 mL/min (ref >60)
Glucose, Bld: 106 mg/dL — ABNORMAL HIGH (ref 70–99)
Potassium: 4 mmol/L (ref 3.5–5.1)
Sodium: 139 mmol/L (ref 135–145)
Total Bilirubin: 0.5 mg/dL (ref 0.3–1.2)
Total Protein: 7.8 g/dL (ref 6.5–8.1)

## 2022-06-11 LAB — CBC WITH DIFFERENTIAL/PLATELET
Abs Immature Granulocytes: 0.01 10*3/uL (ref 0.00–0.07)
Basophils Absolute: 0 10*3/uL (ref 0.0–0.1)
Basophils Relative: 1 %
Eosinophils Absolute: 0.2 10*3/uL (ref 0.0–0.5)
Eosinophils Relative: 4 %
HCT: 37.4 % (ref 36.0–46.0)
Hemoglobin: 12.4 g/dL (ref 12.0–15.0)
Immature Granulocytes: 0 %
Lymphocytes Relative: 28 %
Lymphs Abs: 1.4 10*3/uL (ref 0.7–4.0)
MCH: 28.3 pg (ref 26.0–34.0)
MCHC: 33.2 g/dL (ref 30.0–36.0)
MCV: 85.4 fL (ref 80.0–100.0)
Monocytes Absolute: 0.4 10*3/uL (ref 0.1–1.0)
Monocytes Relative: 8 %
Neutro Abs: 3.1 10*3/uL (ref 1.7–7.7)
Neutrophils Relative %: 59 %
Platelets: 246 10*3/uL (ref 150–400)
RBC: 4.38 MIL/uL (ref 3.87–5.11)
RDW: 13.7 % (ref 11.5–15.5)
WBC: 5.1 10*3/uL (ref 4.0–10.5)
nRBC: 0 % (ref 0.0–0.2)

## 2022-06-11 LAB — VITAMIN B12: Vitamin B-12: 3386 pg/mL — ABNORMAL HIGH (ref 180–914)

## 2022-06-11 LAB — LACTATE DEHYDROGENASE: LDH: 162 U/L (ref 98–192)

## 2022-06-11 NOTE — Assessment & Plan Note (Addendum)
#   Elevated B12 levels- > 1500 on 3 different occasions.  Patient not on any external supplementation.  Discussed the patient that B12 is water-soluble and it should be excreted in urine without any concerns for toxicity.   #Also reviewed association of malignancies with B12; however elevated B12 itself should not be a cause of malignancy.  CT scan February 2023-abdomen pelvis negative for any acute process.  Recent mammogram within normal limits.  Extremely low clinical likelihood of any malignancy/or hematologic disorder.  We will get repeat B12; labs.   # Fatigue:Unlikely related to elevated B12 levels. Unclear etiology defer to PCP.    # Hx of RIGHT BREAST DCIS [2008]- s/p Tam x 3 years. Recent Mammo-WNL>   Thank you Ms.Dugal, FNP for allowing me to participate in the care of your pleasant patient. Please do not hesitate to contact me with questions or concerns in the interim.  # DISPOSITION: # labs today # follow up TBD- Dr.B

## 2022-06-11 NOTE — Progress Notes (Signed)
New patient referred by Dr Phebe Colla for elevated vitamin b12 levels.  Pt has history of breast cancer in 2009.

## 2022-06-11 NOTE — Progress Notes (Signed)
[facility] NOTE  Patient Care Team: [name], FNP as PCP - General (Family Medicine) End, Harrell Gave, MD as PCP - Cardiology (Cardiology)  CHIEF COMPLAINTS/PURPOSE OF CONSULTATION: Elevated B12 levels . Oncology History   No history exists.   RIGHT breast DCIS [2008 s/p lumpectomy s/p RT [Dr.Crystal]- s/p Tam x 3 years.   HISTORY OF PRESENTING ILLNESS: Alone. Ambulating independently.   [name] 66 y.o.  female for referred to [place] for further evaluation recommendations for elevated rB12 levels.   In February 2023 patient had abdominal discomfort-evaluated by Dr. Edilia Bo.  Patient is CT scan in February 2023 that showed mild constipation otherwise unremarkable.  For further work-up of her ongoing fatigue patient had further evaluation-including B12 levels that were consistently elevated on 3 occasions greater than 1500.  Patient complains of ongoing fatigue.  Otherwise not any worse.  Denies any bone pain or joint pains.  No lumps or bumps.  No new shortness of breath or cough.  Review of Systems  Constitutional:  Positive for malaise/fatigue. Negative for chills, diaphoresis, fever and weight loss.  HENT:  Negative for nosebleeds and sore throat.   Eyes:  Negative for double vision.  Respiratory:  Negative for cough, hemoptysis, sputum production, shortness of breath and wheezing.   Cardiovascular:  Negative for chest pain, palpitations, orthopnea and leg swelling.  Gastrointestinal:  Negative for abdominal pain, blood in stool, constipation, diarrhea, heartburn, melena, nausea and vomiting.  Genitourinary:  Negative for dysuria, frequency and urgency.  Musculoskeletal:  Negative for back pain and joint pain.  Skin: Negative.  Negative for itching and rash.  Neurological:  Negative for dizziness, tingling, focal weakness, weakness and headaches.  Endo/Heme/Allergies:  Does not bruise/bleed easily.  Psychiatric/Behavioral:  Negative for  depression. The patient is not nervous/anxious and does not have insomnia.     MEDICAL HISTORY:  Past Medical History:  Diagnosis Date   Anemia    Anxiety    Blood transfusion without reported diagnosis    Breast cancer ([place]) 02/27/2007   Right   COVID-19 08/30/2020   GERD (gastroesophageal reflux disease)    Hypercholesterolemia    Hypertension    Migraine    Hx of in past   Personal history of radiation therapy 2008   Right   Wears dentures    partial upper and lower    SURGICAL HISTORY: Past Surgical History:  Procedure Laterality Date   ABDOMINAL HYSTERECTOMY     BREAST BIOPSY Right 02/27/2007   BREAST LUMPECTOMY Right 04/02/2007   BREAST SURGERY  01/14/2013   breast reduction left side   BROW LIFT Bilateral 06/17/2017   Procedure: BLEPHAROPLASTY UPPER EYELID WITH EXCESS SKIN;  Surgeon: Karle Starch, MD;  Location: [facility];  Service: Ophthalmology;  Laterality: Bilateral;   BROW LIFT Bilateral 11/30/2021   Procedure: BROW PTOSIS REPAIR BILATERAL;  Surgeon: Karle Starch, MD;  Location: [facility];  Service: Ophthalmology;  Laterality: Bilateral;   CESAREAN SECTION     two times   COLONOSCOPY     FOOT SURGERY Right    PTOSIS REPAIR Bilateral 06/17/2017   Procedure: PTOSIS REPAIR RESECT EX;  Surgeon: Karle Starch, MD;  Location: [facility];  Service: Ophthalmology;  Laterality: Bilateral;   REDUCTION MAMMAPLASTY Left    SHOULDER ARTHROSCOPY WITH ROTATOR CUFF REPAIR AND OPEN BICEPS TENODESIS Right 02/01/2019   Procedure: RIGHT SHOULDER ARTHROSCOPY, DEBRIDEMENT, BICEPS TENODESIS, MINI ROTATOR CUFF TEAR REPAIR;  Surgeon: Meredith Pel, MD;  Location:  [facility];  Service: Orthopedics;  Laterality: Right;    SOCIAL HISTORY: Social History   Socioeconomic History   Marital status: Married    Spouse name: [name]   Number of children: 2   Years of education: high school   Highest education level: Not on file   Occupational History   Not on file  Tobacco Use   Smoking status: Never   Smokeless tobacco: Never  Vaping Use   Vaping Use: Never used  Substance and Sexual Activity   Alcohol use: Not Currently   Drug use: No   Sexual activity: Yes    Partners: Male    Birth control/protection: Surgical  Other Topics Concern   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      Family: 2 daughters - [name] and [name] - 3 grandchildren      Enjoys: boating, walking, beach, fishing, outdoor activity      Exercise: keeps busy moving around the house, irregular   Diet: chicken, some read meat, veggies, salads - trying to limit sugar      Safety   Seat belts: Yes    Guns: Yes  and secure   Safe in relationships: Yes    Social Determinants of Radio broadcast assistant Strain: Not on file  Food Insecurity: Not on file  Transportation Needs: Not on file  Physical Activity: Not on file  Stress: Not on file  Social Connections: Not on file  Intimate Partner Violence: Not on file    FAMILY HISTORY: Family History  Problem Relation Age of Onset   Thyroid disease Mother    Hypertension Mother    Hyperlipidemia Mother    Alzheimer's disease Mother    Heart failure Mother    Esophageal cancer Father    Hyperlipidemia Father    Heart murmur Father    Colon polyps Father    COPD Father    Aneurysm Father    Throat cancer Father    Diabetes Sister    Breast cancer Sister    Irritable bowel syndrome Sister    Breast cancer Sister    Ulcerative colitis Sister    Rheum arthritis Sister    Alzheimer's disease Maternal Aunt    Alzheimer's disease Maternal Uncle    Diabetes Paternal Uncle    Alzheimer's disease Maternal Grandmother    Diabetes Paternal Grandmother    Breast cancer Cousin    Breast cancer Cousin    Colon cancer Neg Hx     Stomach cancer Neg Hx    Rectal cancer Neg Hx     ALLERGIES:  is allergic to ezetimibe, statins, and losartan.  MEDICATIONS:  Current Outpatient Medications  Medication Sig Dispense Refill   azelastine (ASTELIN) 0.1 % nasal spray SPRAY 1-2 SPRAYS IN EACH NOSTRIL TWICE DAILY.     escitalopram (LEXAPRO) 20 MG tablet TAKE 1 TABLET BY MOUTH EVERY DAY 90 tablet 2   Evolocumab (REPATHA SURECLICK) 662 MG/ML SOAJ Inject 1 mL into the skin every 14 (fourteen) days. 2 mL 3   fluticasone (FLONASE) 50 MCG/ACT nasal spray Place 2 sprays into both nostrils daily. 16 g 6   ibuprofen (ADVIL) 800 MG tablet Take 1 tablet (800 mg total) by mouth every 8 (eight) hours as needed. 90 tablet 0   omeprazole (PRILOSEC)  20 MG capsule TAKE 1 CAPSULE BY MOUTH EVERY DAY 90 capsule 0   lisinopril (ZESTRIL) 20 MG tablet Take 1 tablet (20 mg total) by mouth daily. (Patient not taking: Reported on 06/11/2022) 90 tablet 3   Multiple Vitamin (MULTIVITAMIN) tablet Take 1 tablet by mouth daily. (Patient not taking: Reported on 06/11/2022)     Omega-3 Fatty Acids (OMEGA 3 500 PO) Take by mouth. (Patient not taking: Reported on 06/11/2022)     No current facility-administered medications for this visit.    PHYSICAL EXAMINATION: ECOG PERFORMANCE STATUS: 0 - Asymptomatic  Vitals:   06/11/22 1140  BP: (!) 161/93  Pulse: 78  Resp: 16  SpO2: 96%   Filed Weights   06/11/22 1140  Weight: 180 lb 12.8 oz (82 kg)    Physical Exam Vitals and nursing note reviewed.  HENT:     Head: Normocephalic and atraumatic.     Mouth/Throat:     Pharynx: Oropharynx is clear.  Eyes:     Extraocular Movements: Extraocular movements intact.     Pupils: Pupils are equal, round, and reactive to light.  Cardiovascular:     Rate and Rhythm: Normal rate and regular rhythm.  Pulmonary:     Comments: Decreased breath sounds bilaterally.  Abdominal:     Palpations: Abdomen is soft.  Musculoskeletal:        General: Normal range of  motion.     Cervical back: Normal range of motion.  Skin:    General: Skin is warm.  Neurological:     General: No focal deficit present.     Mental Status: She is alert and oriented to person, place, and time.  Psychiatric:        Behavior: Behavior normal.        Judgment: Judgment normal.    LABORATORY DATA:  I have reviewed the data as listed Lab Results  Component Value Date   WBC 4.7 10/01/2021   HGB 12.8 10/01/2021   HCT 39.6 10/01/2021   MCV 85.6 10/01/2021   PLT 253.0 10/01/2021   Recent Labs    10/01/21 1038 01/11/22 1055  NA 139 138  K 4.2 4.1  CL 101 101  CO2 30 28  GLUCOSE 79 100*  BUN 18 16  CREATININE 0.69 0.72  CALCIUM 9.3 9.0  GFRNONAA  --  >60  PROT 7.4 7.5  ALBUMIN 4.4 4.0  AST 15 22  ALT 12 18  ALKPHOS 57 67  BILITOT 0.4 0.5    RADIOGRAPHIC STUDIES: I have personally reviewed the radiological images as listed and agreed with the findings in the report. ECHOCARDIOGRAM COMPLETE  Result Date: 05/28/2022    ECHOCARDIOGRAM REPORT   Patient Name:   [name] Date of Exam: 05/28/2022 Medical Rec #:  [id number]      Height:       65.5 in Accession #:    [id number]     Weight:       179.1 lb Date of Birth:  02/27/1956     BSA:          1.898 m Patient Age:    56 years       BP:           132/82 mmHg Patient Gender: F              HR:           76 bpm. Exam Location:  [facility] Procedure: 2D Echo, Cardiac Doppler and Color Doppler Indications:  R06.02 SOB  History:        Patient has no prior history of Echocardiogram examinations.                 Signs/Symptoms:Shortness of Breath; Risk Factors:Hypertension,                 Dyslipidemia and Non-Smoker.  Sonographer:    Pilar Jarvis RDMS, RVT, RDCS Referring Phys: [id number] SHERI HAMMOCK IMPRESSIONS  1. Left ventricular ejection fraction, by estimation, is 60 to 65%. The left ventricle has normal function. The left ventricle has no regional wall motion abnormalities. Left ventricular diastolic parameters  are consistent with Grade I diastolic dysfunction (impaired relaxation).  2. Right ventricular systolic function is normal. The right ventricular size is normal. Tricuspid regurgitation signal is inadequate for assessing PA pressure.  3. The mitral valve is normal in structure. No evidence of mitral valve regurgitation. No evidence of mitral stenosis.  4. The aortic valve is normal in structure. Aortic valve regurgitation is not visualized. No aortic stenosis is present.  5. The inferior vena cava is normal in size with greater than 50% respiratory variability, suggesting right atrial pressure of 3 mmHg. FINDINGS  Left Ventricle: Left ventricular ejection fraction, by estimation, is 60 to 65%. The left ventricle has normal function. The left ventricle has no regional wall motion abnormalities. The left ventricular internal cavity size was normal in size. There is  no left ventricular hypertrophy. Left ventricular diastolic parameters are consistent with Grade I diastolic dysfunction (impaired relaxation). Right Ventricle: The right ventricular size is normal. No increase in right ventricular wall thickness. Right ventricular systolic function is normal. Tricuspid regurgitation signal is inadequate for assessing PA pressure. Left Atrium: Left atrial size was normal in size. Right Atrium: Right atrial size was normal in size. Pericardium: There is no evidence of pericardial effusion. Mitral Valve: The mitral valve is normal in structure. There is mild calcification of the mitral valve leaflet(s). No evidence of mitral valve regurgitation. No evidence of mitral valve stenosis. Tricuspid Valve: The tricuspid valve is normal in structure. Tricuspid valve regurgitation is not demonstrated. No evidence of tricuspid stenosis. Aortic Valve: The aortic valve is normal in structure. Aortic valve regurgitation is not visualized. No aortic stenosis is present. Aortic valve mean gradient measures 3.0 mmHg. Aortic valve peak  gradient measures 5.2 mmHg. Aortic valve area, by VTI measures 2.28 cm. Pulmonic Valve: The pulmonic valve was normal in structure. Pulmonic valve regurgitation is not visualized. No evidence of pulmonic stenosis. Aorta: The aortic root is normal in size and structure. Venous: The inferior vena cava is normal in size with greater than 50% respiratory variability, suggesting right atrial pressure of 3 mmHg. IAS/Shunts: No atrial level shunt detected by color flow Doppler.  LEFT VENTRICLE PLAX 2D LVIDd:         4.70 cm     Diastology LVIDs:         3.20 cm     LV e' medial:    5.55 cm/s LV PW:         0.80 cm     LV E/e' medial:  10.3 LV IVS:        0.90 cm     LV e' lateral:   6.42 cm/s LVOT diam:     2.00 cm     LV E/e' lateral: 8.9 LV SV:         46 LV SV Index:   24 LVOT Area:     3.14 cm  LV Volumes (MOD) LV vol d, MOD A2C: 77.1 ml LV vol d, MOD A4C: 88.0 ml LV vol s, MOD A2C: 37.3 ml LV vol s, MOD A4C: 35.3 ml LV SV MOD A2C:     39.8 ml LV SV MOD A4C:     88.0 ml LV SV MOD BP:      47.2 ml RIGHT VENTRICLE             IVC RV S prime:     11.60 cm/s  IVC diam: 1.70 cm TAPSE (M-mode): 2.0 cm LEFT ATRIUM             Index LA diam:        3.60 cm 1.90 cm/m LA Vol (A2C):   49.3 ml 25.97 ml/m LA Vol (A4C):   32.6 ml 17.17 ml/m LA Biplane Vol: 41.4 ml 21.81 ml/m  AORTIC VALVE                    PULMONIC VALVE AV Area (Vmax):    2.16 cm     PV Vmax:       0.90 m/s AV Area (Vmean):   2.03 cm     PV Peak grad:  3.2 mmHg AV Area (VTI):     2.28 cm AV Vmax:           114.00 cm/s AV Vmean:          76.600 cm/s AV VTI:            0.201 m AV Peak Grad:      5.2 mmHg AV Mean Grad:      3.0 mmHg LVOT Vmax:         78.30 cm/s LVOT Vmean:        49.500 cm/s LVOT VTI:          0.146 m LVOT/AV VTI ratio: 0.73  AORTA Ao Root diam: 3.30 cm Ao Asc diam:  3.20 cm Ao Arch diam: 2.4 cm MITRAL VALVE MV Area (PHT): 3.97 cm    SHUNTS MV Decel Time: 191 msec    Systemic VTI:  0.15 m MV E velocity: 57.00 cm/s  Systemic Diam: 2.00 cm  MV A velocity: 85.30 cm/s MV E/A ratio:  0.67 Ida Rogue MD Electronically signed by Ida Rogue MD Signature Date/Time: 05/28/2022/12:57:45 PM    Final      Elevated vitamin B12 level # Elevated B12 levels- > 1500 on 3 different occasions.  Patient not on any external supplementation.  Discussed the patient that B12 is water-soluble and it should be excreted in urine without any concerns for toxicity.   #Also reviewed association of malignancies with B12; however elevated B12 itself should not be a cause of malignancy.  CT scan February 2023-abdomen pelvis negative for any acute process.  Recent mammogram within normal limits.  Extremely low clinical likelihood of any malignancy/or hematologic disorder.  We will get repeat B12; labs.   # Fatigue:Unlikely related to elevated B12 levels. Unclear etiology defer to PCP.    # Hx of RIGHT BREAST DCIS [2008]- s/p Tam x 3 years. Recent Mammo-WNL>   Thank you Ms.Dugal, FNP for allowing me to participate in the care of your pleasant patient. Please do not hesitate to contact me with questions or concerns in the interim.  # DISPOSITION: # labs today # follow up TBD- Dr.B     Above plan of care was discussed with patient/family in detail.  My contact information was given to the patient/family.  Cammie Sickle, MD 06/11/2022 12:31 PM

## 2022-06-13 ENCOUNTER — Encounter: Payer: Self-pay | Admitting: Internal Medicine

## 2022-06-17 ENCOUNTER — Telehealth: Payer: Self-pay | Admitting: Internal Medicine

## 2022-06-17 DIAGNOSIS — H43812 Vitreous degeneration, left eye: Secondary | ICD-10-CM | POA: Diagnosis not present

## 2022-06-17 NOTE — Telephone Encounter (Signed)
I spoke to patient regarding ongoing elevated B12 levels.  Discussed the multiple causes including malignancy liver disease as potential etiologies.  However recent imaging/and blood work concerning for any malignancy or liver disease.  No clear explanation noted for the elevated B12.     Elevated B12 levels should typically should not cause any symptoms-as it is a water soluble vitamin.  Defer to PCP regarding work-up for fatigue.  I would recommend continued follow-up with PCP-consider checking labs/ B12 levels in about 6 months.  Follow-up with [place] as needed.   Will discuss with PCP.   GB

## 2022-06-17 NOTE — Telephone Encounter (Signed)
Thank you for seeing and evaluating the patient. I appreciate your recommendations.

## 2022-06-18 ENCOUNTER — Ambulatory Visit (INDEPENDENT_AMBULATORY_CARE_PROVIDER_SITE_OTHER): Payer: PPO | Admitting: Family

## 2022-06-18 ENCOUNTER — Encounter: Payer: Self-pay | Admitting: Family

## 2022-06-18 VITALS — BP 158/96 | HR 71 | Temp 98.6°F | Resp 16 | Ht 65.5 in | Wt 179.1 lb

## 2022-06-18 DIAGNOSIS — I10 Essential (primary) hypertension: Secondary | ICD-10-CM | POA: Diagnosis not present

## 2022-06-18 DIAGNOSIS — F419 Anxiety disorder, unspecified: Secondary | ICD-10-CM | POA: Diagnosis not present

## 2022-06-18 DIAGNOSIS — J0110 Acute frontal sinusitis, unspecified: Secondary | ICD-10-CM | POA: Insufficient documentation

## 2022-06-18 DIAGNOSIS — R5382 Chronic fatigue, unspecified: Secondary | ICD-10-CM

## 2022-06-18 MED ORDER — BUPROPION HCL ER (XL) 150 MG PO TB24: 150.0000 mg | ORAL_TABLET | Freq: Every day | ORAL | 0 refills | Status: DC

## 2022-06-18 MED ORDER — FLUCONAZOLE 150 MG PO TABS: 150.0000 mg | ORAL_TABLET | Freq: Once | ORAL | 0 refills | Status: AC

## 2022-06-18 MED ORDER — AMLODIPINE BESYLATE 2.5 MG PO TABS: 2.5000 mg | ORAL_TABLET | Freq: Every day | ORAL | 2 refills | Status: DC

## 2022-06-18 MED ORDER — AMOXICILLIN-POT CLAVULANATE 875-125 MG PO TABS: 1.0000 | ORAL_TABLET | Freq: Two times a day (BID) | ORAL | 0 refills | Status: DC

## 2022-06-18 NOTE — Assessment & Plan Note (Signed)
Prescription given for augmentin 875/125 mg po bid for ten days. Pt to continue tylenol/ibuprofen prn sinus pain. Continue with humidifier prn and steam showers recommended as well. instructed If no symptom improvement in 48 hours please f/u  rx diflucan 150 mg for high risk vaginal candida

## 2022-06-18 NOTE — Patient Instructions (Addendum)
Try adding Wellbutrin 150 mg XL once daily.  Continue lexapro 20 mg dose.   Start amlodipine today at 2.5 mg once daily.  Start monitoring your blood pressure daily, around the same time of day, for the next 2-3 weeks.  Ensure that you have rested for 30 minutes prior to checking your blood pressure. Record your readings and bring them to your next visit.  Antibiotic sent to preferred pharmacy.   Please increase oral fluids, steamy hot shower/humidifier prn.  Please follow up if no improvement in 2-3 days.   It was a pleasure seeing you today! Please do not hesitate to reach out with any questions and or concerns.  Regards,   Laurieann Friddle   Regards,   [name] FNP-C

## 2022-06-18 NOTE — Assessment & Plan Note (Signed)
Stop lisninopril  Start amlodipine 2.5 mg once daily  Pt advised of the following:  Continue medication as prescribed. Monitor blood pressure periodically and/or when you feel symptomatic. Goal is <130/90 on average. Ensure that you have rested for 30 minutes prior to checking your blood pressure. Record your readings and bring them to your next visit if necessary.work on a low sodium diet.

## 2022-06-18 NOTE — Assessment & Plan Note (Signed)
Add rx wellbutrin 150 mg XL once daily Continue lexapro 20 mg once daily

## 2022-06-18 NOTE — Progress Notes (Addendum)
Established Patient Office Visit  Subjective:  Patient ID: [name], female    DOB: [birth year]  Age: 66 y.o. MRN: [id number]  CC:  Chief Complaint  Patient presents with  . Abnormal Lab  . Hypertension    HPI [name] is here today for follow up.   Pt is with acute concerns.  HTN: pt states was noticing that fatigue was worsening with lisinopril so she d/c it daily. She states in the last week average blood pressure around 135/80.   Vitamin B12 excess: recently seen by hematologist, workup for b12 excess unremarkable. Advised to keep pt up with all annual screenings.   Chronic fatigue, slight improvement with d/c of lisinopril. However still with ongoing fatigue. 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 She has a lot on her plate, even with her oldest daughter. She takes lexapro 20 mg once daily, which she does take at night time. She does sleep 7-8 hours at night, denies snoring or gasping for breath.   Also with sinus pressure and covid tested last week which was negative.  She is having a sinus headache with nasal congestion.   Last week had floater left eye, went to opthalmologist for this, and was diagnosed with benign eye floater. Yesterday with xrays of eye, and enlarged 'freckle' behind the right eye. Has appt November 1st to better visualize the area.   Past Medical History:  Diagnosis Date  . Anemia   . Anxiety   . Blood transfusion without reported diagnosis   . Breast cancer ([place]) 02/27/2007   Right  . COVID-19 08/30/2020  . GERD (gastroesophageal reflux disease)   . Hypercholesterolemia   . Hypertension   . Migraine    Hx of in past  . Personal history of radiation therapy 2008   Right  . Wears dentures    partial upper and lower    Past Surgical History:  Procedure Laterality Date  . ABDOMINAL HYSTERECTOMY    . BREAST BIOPSY Right 02/27/2007  . BREAST LUMPECTOMY Right 04/02/2007   . BREAST SURGERY  01/14/2013   breast reduction left side  . BROW LIFT Bilateral 06/17/2017   Procedure: BLEPHAROPLASTY UPPER EYELID WITH EXCESS SKIN;  Surgeon: Karle Starch, MD;  Location: [facility];  Service: Ophthalmology;  Laterality: Bilateral;  . BROW LIFT Bilateral 11/30/2021   Procedure: BROW PTOSIS REPAIR BILATERAL;  Surgeon: Karle Starch, MD;  Location: [facility];  Service: Ophthalmology;  Laterality: Bilateral;  . CESAREAN SECTION     two times  . COLONOSCOPY    . FOOT SURGERY Right   . PTOSIS REPAIR Bilateral 06/17/2017   Procedure: PTOSIS REPAIR RESECT EX;  Surgeon: Karle Starch, MD;  Location: [facility];  Service: Ophthalmology;  Laterality: Bilateral;  . REDUCTION MAMMAPLASTY Left   . SHOULDER ARTHROSCOPY WITH ROTATOR CUFF REPAIR AND OPEN BICEPS TENODESIS Right 02/01/2019   Procedure: RIGHT SHOULDER ARTHROSCOPY, DEBRIDEMENT, BICEPS TENODESIS, MINI ROTATOR CUFF TEAR REPAIR;  Surgeon: Meredith Pel, MD;  Location: [facility];  Service: Orthopedics;  Laterality: Right;    Family History  Problem Relation Age of Onset  . Thyroid disease Mother   . Hypertension Mother   . Hyperlipidemia Mother   . Alzheimer's disease Mother   . Heart failure Mother   . Esophageal cancer Father   . Hyperlipidemia Father   . Heart murmur Father   . Colon polyps Father   .  COPD Father   . Aneurysm Father   . Throat cancer Father   . Diabetes Sister   . Breast cancer Sister   . Irritable bowel syndrome Sister   . Breast cancer Sister   . Ulcerative colitis Sister   . Rheum arthritis Sister   . Alzheimer's disease Maternal Aunt   . Alzheimer's disease Maternal Uncle   . Diabetes Paternal Uncle   . Alzheimer's disease Maternal Grandmother   . Diabetes Paternal Grandmother   . Breast cancer Cousin   . Breast cancer Cousin   . Colon cancer Neg Hx   . Stomach cancer Neg Hx   . Rectal cancer Neg Hx     Social History    Socioeconomic History  . Marital status: Married    Spouse name: [name]  . Number of children: 2  . Years of education: high school  . Highest education level: Not on file  Occupational History  . Not on file  Tobacco Use  . Smoking status: Never  . Smokeless tobacco: Never  Vaping Use  . Vaping Use: Never used  Substance and Sexual Activity  . Alcohol use: Not Currently  . Drug use: No  . Sexual activity: Yes    Partners: Male    Birth control/protection: Surgical  Other Topics Concern  . 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      Family: 2 daughters - [name] and [name] - 3 grandchildren      Enjoys: boating, walking, beach, fishing, outdoor activity      Exercise: keeps busy moving around the house, irregular   Diet: chicken, some read meat, veggies, salads - trying to limit sugar      Safety   Seat belts: Yes    Guns: Yes  and secure   Safe in relationships: Yes    Social Determinants of Radio broadcast assistant Strain: Not on file  Food Insecurity: Not on file  Transportation Needs: Not on file  Physical Activity: Not on file  Stress: Not on file  Social Connections: Not on file  Intimate Partner Violence: Not on file    Outpatient Medications Prior to Visit  Medication Sig Dispense Refill  . azelastine (ASTELIN) 0.1 % nasal spray SPRAY 1-2 SPRAYS IN EACH NOSTRIL TWICE DAILY.    [name] escitalopram (LEXAPRO) 20 MG tablet TAKE 1 TABLET BY MOUTH EVERY DAY 90 tablet 2  . Evolocumab (REPATHA SURECLICK) 481 MG/ML SOAJ Inject 1 mL into the skin every 14 (fourteen) days. 2 mL 3  . fluticasone (FLONASE) 50 MCG/ACT nasal spray Place 2 sprays into both nostrils daily. 16 g 6  . ibuprofen (ADVIL) 800 MG tablet Take 1 tablet (800 mg total) by mouth every 8 (eight) hours as needed. 90 tablet 0  . Multiple Vitamin  (MULTIVITAMIN) tablet Take 1 tablet by mouth daily.    . Omega-3 Fatty Acids (OMEGA 3 500 PO) Take by mouth.    [name] omeprazole (PRILOSEC) 20 MG capsule TAKE 1 CAPSULE BY MOUTH EVERY DAY 90 capsule 0  . lisinopril (ZESTRIL) 20 MG tablet Take 1 tablet (20 mg total) by mouth daily. 90 tablet 3   No facility-administered medications prior to visit.    Allergies  Allergen Reactions  . Ezetimibe Other (See Comments)    Fatigue and constipation  .  Lisinopril Other (See Comments)    Increased fatigue, sleeping more than needed  . Statins Other (See Comments)    Muscle aches  . Losartan Other (See Comments)    Fatigue, malaise      Review of Systems  Respiratory:  Negative for shortness of breath.   Cardiovascular:  Negative for chest pain and palpitations.  Gastrointestinal:  Negative for constipation and diarrhea.  Genitourinary:  Negative for dysuria, frequency and urgency.  Musculoskeletal:  Negative for myalgias.  Psychiatric/Behavioral:  Negative for depression and suicidal ideas.   All other systems reviewed and are negative.    Objective:    Physical Exam Constitutional:      General: She is not in acute distress.    Appearance: Normal appearance. She is not ill-appearing.  HENT:     Right Ear: Tympanic membrane normal.     Left Ear: Tympanic membrane normal.     Nose: Congestion present. No rhinorrhea.     Right Turbinates: Swollen. Not enlarged.     Left Turbinates: Swollen. Not enlarged.     Right Sinus: Maxillary sinus tenderness and frontal sinus tenderness present.     Left Sinus: Maxillary sinus tenderness and frontal sinus tenderness present.     Mouth/Throat:     Mouth: Mucous membranes are moist.     Pharynx: No pharyngeal swelling, oropharyngeal exudate or posterior oropharyngeal erythema.     Tonsils: No tonsillar exudate.  Eyes:     Extraocular Movements: Extraocular movements intact.     Conjunctiva/sclera: Conjunctivae normal.     Pupils: Pupils are  equal, round, and reactive to light.  Neck:     Thyroid: No thyroid mass.  Cardiovascular:     Rate and Rhythm: Normal rate and regular rhythm.  Pulmonary:     Effort: Pulmonary effort is normal.     Breath sounds: Normal breath sounds.  Musculoskeletal:     Right lower leg: No edema.     Left lower leg: No edema.  Lymphadenopathy:     Cervical:     Right cervical: No superficial cervical adenopathy.    Left cervical: No superficial cervical adenopathy.  Neurological:     General: No focal deficit present.     Mental Status: She is alert and oriented to person, place, and time. Mental status is at baseline.  Psychiatric:        Mood and Affect: Mood normal.        Behavior: Behavior normal.        Thought Content: Thought content normal.        Judgment: Judgment normal.      BP (!) 158/96   Pulse 71   Temp 98.6 F (37 C)   Resp 16   Ht 5' 5.5" (1.664 m)   Wt 179 lb 2 oz (81.3 kg)   SpO2 97%   BMI 29.35 kg/m  Wt Readings from Last 3 Encounters:  06/18/22 179 lb 2 oz (81.3 kg)  06/11/22 180 lb 12.8 oz (82 kg)  05/29/22 177 lb (80.3 kg)     Health Maintenance Due  Topic Date Due  . Hepatitis C Screening  Never done  . Zoster Vaccines- Shingrix (1 of 2) Never done  . Fecal DNA (Cologuard)  06/17/2021  . DEXA SCAN  08/29/2021    There are no preventive care reminders to display for this patient.  Lab Results  Component Value Date   TSH 1.71 03/28/2022   Lab Results  Component Value Date  WBC 5.1 06/11/2022   HGB 12.4 06/11/2022   HCT 37.4 06/11/2022   MCV 85.4 06/11/2022   PLT 246 06/11/2022   Lab Results  Component Value Date   NA 139 06/11/2022   K 4.0 06/11/2022   CO2 29 06/11/2022   GLUCOSE 106 (H) 06/11/2022   BUN 14 06/11/2022   CREATININE 0.88 06/11/2022   BILITOT 0.5 06/11/2022   ALKPHOS 74 06/11/2022   AST 21 06/11/2022   ALT 15 06/11/2022   PROT 7.8 06/11/2022   ALBUMIN 4.0 06/11/2022   CALCIUM 8.8 (L) 06/11/2022   ANIONGAP 5  06/11/2022   GFR 91.29 10/01/2021   Lab Results  Component Value Date   CHOL 151 05/14/2022   Lab Results  Component Value Date   HDL 50.90 05/14/2022   Lab Results  Component Value Date   LDLCALC 62 05/14/2022   Lab Results  Component Value Date   TRIG 190.0 (H) 05/14/2022   Lab Results  Component Value Date   CHOLHDL 3 05/14/2022   Lab Results  Component Value Date   HGBA1C 6.4 09/27/2020      Assessment & Plan:   Problem List Items Addressed This Visit       Cardiovascular and Mediastinum   Primary hypertension - Primary    Stop lisninopril  Start amlodipine 2.5 mg once daily  Pt advised of the following:  Continue medication as prescribed. Monitor blood pressure periodically and/or when you feel symptomatic. Goal is <130/90 on average. Ensure that you have rested for 30 minutes prior to checking your blood pressure. Record your readings and bring them to your next visit if necessary.work on a low sodium diet.       Relevant Medications   amLODipine (NORVASC) 2.5 MG tablet     Respiratory   Acute non-recurrent frontal sinusitis    Prescription given for augmentin 875/125 mg po bid for ten days. Pt to continue tylenol/ibuprofen prn sinus pain. Continue with humidifier prn and steam showers recommended as well. instructed If no symptom improvement in 48 hours please f/u  rx diflucan 150 mg for high risk vaginal candida       Relevant Medications   amoxicillin-clavulanate (AUGMENTIN) 875-125 MG tablet   fluconazole (DIFLUCAN) 150 MG tablet     Other   Anxiety    Add rx wellbutrin 150 mg XL once daily Continue lexapro 20 mg once daily       Relevant Medications   buPROPion (WELLBUTRIN XL) 150 MG 24 hr tablet   Chronic fatigue    Going to try to add wellbutrin to lexapro to see if this is worsening fatigue.        Meds ordered this encounter  Medications  . amLODipine (NORVASC) 2.5 MG tablet    Sig: Take 1 tablet (2.5 mg total) by mouth daily.     Dispense:  30 tablet    Refill:  2    Order Specific Question:   Supervising Provider    Answer:   BEDSOLE, AMY E [[id number]]  . buPROPion (WELLBUTRIN XL) 150 MG 24 hr tablet    Sig: Take 1 tablet (150 mg total) by mouth daily.    Dispense:  90 tablet    Refill:  0    Order Specific Question:   Supervising Provider    Answer:   BEDSOLE, AMY E [[id number]]  . amoxicillin-clavulanate (AUGMENTIN) 875-125 MG tablet    Sig: Take 1 tablet by mouth 2 (two) times daily.    Dispense:  20  tablet    Refill:  0    Order Specific Question:   Supervising Provider    Answer:   BEDSOLE, AMY E [[id number]]  . fluconazole (DIFLUCAN) 150 MG tablet    Sig: Take 1 tablet (150 mg total) by mouth once for 1 dose.    Dispense:  1 tablet    Refill:  0    Order Specific Question:   Supervising Provider    Answer:   BEDSOLE, AMY E [[id number]]    Follow-up: Return in about 1 month (around 07/19/2022) for f/u blood pressure.    [name], FNP

## 2022-06-18 NOTE — Assessment & Plan Note (Signed)
Going to try to add wellbutrin to lexapro to see if this is worsening fatigue.

## 2022-07-18 DIAGNOSIS — D3131 Benign neoplasm of right choroid: Secondary | ICD-10-CM | POA: Diagnosis not present

## 2022-07-23 ENCOUNTER — Other Ambulatory Visit: Payer: Self-pay | Admitting: Internal Medicine

## 2022-07-29 ENCOUNTER — Telehealth: Payer: Self-pay

## 2022-07-29 NOTE — Telephone Encounter (Signed)
Prior Authorization initiated by pharmacy in [email or web address]  KEY: [id number]  Response: Wait for Determination Please wait for [employer] to return a determination.

## 2022-07-29 NOTE — Telephone Encounter (Signed)
Received fax from [facility] stating that the prior authorization for Repatha SureClick 734 mg/ml has been approved from 07-29-2022 until 07-30-2023.

## 2022-08-13 ENCOUNTER — Ambulatory Visit: Payer: PPO | Admitting: Cardiology

## 2022-08-29 ENCOUNTER — Encounter: Payer: Self-pay | Admitting: Family

## 2022-08-30 ENCOUNTER — Telehealth: Payer: Self-pay | Admitting: Family Medicine

## 2022-08-30 ENCOUNTER — Encounter: Payer: Self-pay | Admitting: Emergency Medicine

## 2022-08-30 ENCOUNTER — Telehealth: Payer: Self-pay | Admitting: Family

## 2022-08-30 ENCOUNTER — Ambulatory Visit
Admission: EM | Admit: 2022-08-30 | Discharge: 2022-08-30 | Disposition: A | Discharge status: Home / Self Care | Payer: PPO | Attending: Family Medicine | Admitting: Family Medicine

## 2022-08-30 DIAGNOSIS — U071 COVID-19: Secondary | ICD-10-CM | POA: Insufficient documentation

## 2022-08-30 LAB — RESP PANEL BY RT-PCR (RSV, FLU A&B, COVID)  RVPGX2
Influenza A by PCR: NEGATIVE
Influenza B by PCR: NEGATIVE
Resp Syncytial Virus by PCR: NEGATIVE
SARS Coronavirus 2 by RT PCR: POSITIVE — AB

## 2022-08-30 MED ORDER — PROMETHAZINE-DM 6.25-15 MG/5ML PO SYRP: 5.0000 mL | ORAL_SOLUTION | Freq: Four times a day (QID) | ORAL | 0 refills | Status: DC | PRN

## 2022-08-30 MED ORDER — MOLNUPIRAVIR EUA 200MG CAPSULE: 4.0000 | ORAL_CAPSULE | Freq: Two times a day (BID) | ORAL | 0 refills | Status: AC

## 2022-08-30 NOTE — Telephone Encounter (Signed)
Patient called about wanting cough medicine. Sent promethazine-DM to requested pharmacy.   Lyndee Hensen, DO

## 2022-08-30 NOTE — Discharge Instructions (Addendum)
Your test for COVID-19 was positive, meaning that you were infected with the novel coronavirus and could give the germ to others.  Please continue isolation at home for at least 5 days since the start of your symptoms. Once you complete your 5 day quarantine, you may return to normal activities as long as you've not had a fever for over 24 hours(without taking fever reducing medicine) and your symptoms are improving. Be sure to wear a mask until Day 11.   If your were prescribed medication. Stop by the pharmacy to pick it up.   Please continue good preventive care measures, including:  frequent hand-washing, avoid touching your face, cover coughs/sneezes, stay out of crowds and keep a 6 foot distance from others.  Go to the nearest hospital emergency room if fever/cough/breathlessness are severe or illness seems like a threat to life.  You can take Tylenol and/or Ibuprofen as needed for fever reduction and pain relief.    For cough: honey 1/2 to 1 teaspoon (you can dilute the honey in water or another fluid).  You can also use guaifenesin and dextromethorphan for cough. You can use a humidifier for chest congestion and cough.  If you don't have a humidifier, you can sit in the bathroom with the hot shower running.      For sore throat: try warm salt water gargles, Mucinex sore throat cough drops or cepacol lozenges, throat spray, warm tea or water with lemon/honey, popsicles or ice, or OTC cold relief medicine for throat discomfort. You can also purchase chloraseptic spray at the pharmacy or dollar store.   For congestion: take a daily anti-histamine like Zyrtec, Claritin, and a oral decongestant, such as pseudoephedrine.  You can also use Flonase 1-2 sprays in each nostril daily. Afrin is also a good option, if you do not have high blood pressure.    It is important to stay hydrated: drink plenty of fluids (water, gatorade/powerade/pedialyte, juices, or teas) to keep your throat moisturized and help  further relieve irritation/discomfort.    Return or go to the Emergency Department if symptoms worsen or do not improve in the next few days  

## 2022-08-30 NOTE — Telephone Encounter (Signed)
Noted. Agreed with her getting a virtual visit

## 2022-08-30 NOTE — Telephone Encounter (Signed)
Noted. Agree with starting with a VV

## 2022-08-30 NOTE — Telephone Encounter (Signed)
Spoke to patient by telephone and was advised that she went to a Christmas party Monday and now 5 people at the party has covid. Patient stated that she tested positive yesterday with covid. . Patient stated that she started with a cough Wednesday. Patient was instructed on how to set up a virtual visit with [facility]. Patient was given ER precautions and she verbalized understanding.

## 2022-08-30 NOTE — ED Provider Notes (Signed)
[facility]    CSN: [id number] Arrival date & time: 08/30/22  1324      History   Chief Complaint Chief Complaint  Patient presents with   Covid Positive   Cough    HPI [name] is a 66 y.o. female.   HPI   [name] presents after positive COVID test. She went to a Christmas lunch on Monday and 4 people have tested positive. She took a home COVID test yesterday.  Endorses cough, sneezing, stuffy nose, back pain from coughing.  Denies sore throat, vomiting or diarrhea.  Had decreased appetite. She has been taking OTC meds with some relief.      Past Medical History:  Diagnosis Date   Anemia    Anxiety    Blood transfusion without reported diagnosis    Breast cancer ([place]) 02/27/2007   Right   COVID-19 08/30/2020   GERD (gastroesophageal reflux disease)    Hypercholesterolemia    Hypertension    Migraine    Hx of in past   Personal history of radiation therapy 2008   Right   Wears dentures    partial upper and lower    Patient Active Problem List   Diagnosis Date Noted   Acute non-recurrent frontal sinusitis 06/18/2022   Iron deficiency anemia 05/29/2022   Hot flashes 05/29/2022   Chronic fatigue 05/29/2022   Reflux gastritis 05/14/2022   Peripheral muscle fatigue 05/14/2022   Mixed hyperlipidemia 05/14/2022   Elevated vitamin B12 level 05/14/2022   Primary hypertension 03/28/2022   Heterozygous familial hypercholesterolemia 01/11/2022   Aortic atherosclerosis ([place]) 01/11/2022   Myalgia due to statin 11/29/2021   Statin intolerance 11/29/2021   Intractable migraine with aura without status migrainosus 11/29/2021   Cervico-occipital neuralgia 06/01/2013   Anxiety 12/31/2011   History of breast cancer 12/31/2011    Past Surgical History:  Procedure Laterality Date   ABDOMINAL HYSTERECTOMY     BREAST BIOPSY Right 02/27/2007   BREAST LUMPECTOMY Right 04/02/2007   BREAST SURGERY  01/14/2013   breast reduction left side   BROW LIFT  Bilateral 06/17/2017   Procedure: BLEPHAROPLASTY UPPER EYELID WITH EXCESS SKIN;  Surgeon: Leah Starch, MD;  Location: [facility];  Service: Ophthalmology;  Laterality: Bilateral;   BROW LIFT Bilateral 11/30/2021   Procedure: BROW PTOSIS REPAIR BILATERAL;  Surgeon: Leah Starch, MD;  Location: [facility];  Service: Ophthalmology;  Laterality: Bilateral;   CESAREAN SECTION     two times   COLONOSCOPY     FOOT SURGERY Right    PTOSIS REPAIR Bilateral 06/17/2017   Procedure: PTOSIS REPAIR RESECT EX;  Surgeon: Leah Starch, MD;  Location: [facility];  Service: Ophthalmology;  Laterality: Bilateral;   REDUCTION MAMMAPLASTY Left    SHOULDER ARTHROSCOPY WITH ROTATOR CUFF REPAIR AND OPEN BICEPS TENODESIS Right 02/01/2019   Procedure: RIGHT SHOULDER ARTHROSCOPY, DEBRIDEMENT, BICEPS TENODESIS, MINI ROTATOR CUFF TEAR REPAIR;  Surgeon: Leah Pel, MD;  Location: [facility];  Service: Orthopedics;  Laterality: Right;    OB History     Gravida  2   Para  2   Term  2   Preterm  0   AB  0   Living  2      SAB  0   IAB  0   Ectopic  0   Multiple  0   Live Births               Home Medications  Prior to Admission medications   Medication Sig Start Date [name] Date Taking? Authorizing Provider  amLODipine (NORVASC) 2.5 MG tablet Take 1 tablet (2.5 mg total) by mouth daily. 06/18/22  Yes [name], Leah Bach, FNP  buPROPion (WELLBUTRIN XL) 150 MG 24 hr tablet Take 1 tablet (150 mg total) by mouth daily. 06/18/22  Yes [name], Leah Bach, FNP  escitalopram (LEXAPRO) 20 MG tablet TAKE 1 TABLET BY MOUTH EVERY DAY 01/01/22  Yes [name], Tabitha, FNP  Evolocumab (REPATHA SURECLICK) 341 MG/ML SOAJ INJECT 1 ML INTO THE SKIN EVERY 14 (FOURTEEN) DAYS. 07/23/22  Yes [name], Leah Gave, MD  molnupiravir EUA (LAGEVRIO) 200 mg CAPS capsule Take 4 capsules (800 mg total) by mouth 2 (two) times daily for 5 days. 08/30/22 09/04/22 Yes Meloni Hinz, Leah Juniper, [name]  Multiple  Vitamin (MULTIVITAMIN) tablet Take 1 tablet by mouth daily.   Yes [provider]  Omega-3 Fatty Acids (OMEGA 3 500 PO) Take by mouth.   Yes [provider]  omeprazole (PRILOSEC) 20 MG capsule TAKE 1 CAPSULE BY MOUTH EVERY DAY 05/14/22  Yes [name], Tabitha, FNP  azelastine (ASTELIN) 0.1 % nasal spray SPRAY 1-2 SPRAYS IN EACH NOSTRIL TWICE DAILY. 10/22/21   [provider]  fluticasone (FLONASE) 50 MCG/ACT nasal spray Place 2 sprays into both nostrils daily. 06/25/18   Leah Passy, MD  ibuprofen (ADVIL) 800 MG tablet Take 1 tablet (800 mg total) by mouth every 8 (eight) hours as needed. 03/30/20   Leah Schooner, MD  promethazine-dextromethorphan (PROMETHAZINE-DM) 6.25-15 MG/5ML syrup Take 5 mLs by mouth 4 (four) times daily as needed. 08/30/22   Leah Hensen, [name]    Family History Family History  Problem Relation Age of Onset   Thyroid disease Mother    Hypertension Mother    Hyperlipidemia Mother    Alzheimer's disease Mother    Heart failure Mother    Esophageal cancer Father    Hyperlipidemia Father    Heart murmur Father    Colon polyps Father    COPD Father    Aneurysm Father    Throat cancer Father    Diabetes Sister    Breast cancer Sister    Irritable bowel syndrome Sister    Breast cancer Sister    Ulcerative colitis Sister    Rheum arthritis Sister    Alzheimer's disease Maternal Aunt    Alzheimer's disease Maternal Uncle    Diabetes Paternal Uncle    Alzheimer's disease Maternal Grandmother    Diabetes Paternal Grandmother    Breast cancer Cousin    Breast cancer Cousin    Colon cancer Neg Hx    Stomach cancer Neg Hx    Rectal cancer Neg Hx     Social History Social History   Tobacco Use   Smoking status: Never   Smokeless tobacco: Never  Vaping Use   Vaping Use: Never used  Substance Use Topics   Alcohol use: Not Currently   Drug use: No     Allergies   Ezetimibe, Lisinopril, Statins, and Losartan   Review of  Systems Review of Systems: negative unless otherwise stated in HPI.      Physical Exam Triage Vital Signs ED Triage Vitals  Enc Vitals Group     BP 08/30/22 1614 (!) 134/96     Pulse Rate 08/30/22 1614 83     Resp 08/30/22 1614 14     Temp 08/30/22 1614 98.2 F (36.8 C)     Temp Source 08/30/22 1614 Oral     SpO2 08/30/22 1614  97 %     Weight 08/30/22 1611 167 lb (75.8 kg)     Height 08/30/22 1611 5' 5.5" (1.664 m)     Head Circumference --      Peak Flow --      Pain Score 08/30/22 1611 3     Pain Loc --      Pain Edu? --      Excl. in [place]? --    No data found.  Updated Vital Signs BP (!) 134/96 (BP Location: Left Arm)   Pulse 83   Temp 98.2 F (36.8 C) (Oral)   Resp 14   Ht 5' 5.5" (1.664 m)   Wt 75.8 kg   SpO2 97%   BMI 27.37 kg/m   Visual Acuity Right Eye Distance:   Left Eye Distance:   Bilateral Distance:    Right Eye Near:   Left Eye Near:    Bilateral Near:     Physical Exam GEN:     alert, non-toxic appearing female in no distress    HENT:  mucus membranes moist, clear nasal discharge, EYES:   pupils equal and reactive, no scleral injection or discharge NECK:  normal ROM, no meningismus   RESP:  no increased work of breathing, clear to auscultation bilaterally CVS:   regular rate and rhythm Skin:   warm and dry, no rash on visible skin    UC Treatments / Results  Labs (all labs ordered are listed, but only abnormal results are displayed) Labs Reviewed  RESP PANEL BY RT-PCR (RSV, FLU A&B, COVID)  [id number] - Abnormal; Notable for the following components:      Result Value   SARS Coronavirus 2 by RT PCR POSITIVE (*)    All other components within normal limits    EKG   Radiology No results found.  Procedures Procedures (including critical care time)  Medications Ordered in UC Medications - No data to display  Initial Impression / Assessment and Plan / UC Course  I have reviewed the triage vital signs and the nursing  notes.  Pertinent labs & imaging results that were available during my care of the patient were reviewed by me and considered in my medical decision making (see chart for details).       Pt is a 66 y.o. female who presents for 2 days of respiratory symptoms. Takila is afebrile here without recent antipyretics. Satting well on room air. Overall pt is non-toxic appearing, well hydrated, without respiratory distress. Pulmonary exam is unremarkable. COVID, RSV and  influenza testing obtained and she was positive for COVID.    Discussed symptomatic treatment.  her cough bothers her the most. Treat with Promethazine DM. Pulmonary exam she has equal aeration bilaterally, imaging deferred.  She is  interested in treatment for COVID. After shared decision making, she is interested in [facility].  Rx sent to pharmacy.    Isolation and quarantine instructions provided. Typical duration of symptoms discussed. ED and return precautions and understanding voiced. Discussed MDM, treatment plan and plan for follow-up with patient/parent who agrees with plan.    Final Clinical Impressions(s) / UC Diagnoses   Final diagnoses:  [id number]     Discharge Instructions      Your test for COVID-19 was positive, meaning that you were infected with the novel coronavirus and could give the germ to others.  Please continue isolation at home for at least 5 days since the start of your symptoms. Once you complete your 5 day quarantine, you may  return to normal activities as long as you've not had a fever for over 24 hours(without taking fever reducing medicine) and your symptoms are improving. Be sure to wear a mask until Day 11.   If your were prescribed medication. Stop by the pharmacy to pick it up.   Please continue good preventive care measures, including:  frequent hand-washing, avoid touching your face, cover coughs/sneezes, stay out of crowds and keep a 6 foot distance from others.  Go to the nearest hospital  emergency room if fever/cough/breathlessness are severe or illness seems like a threat to life.  You can take Tylenol and/or Ibuprofen as needed for fever reduction and pain relief.    For cough: honey 1/2 to 1 teaspoon (you can dilute the honey in water or another fluid).  You can also use guaifenesin and dextromethorphan for cough. You can use a humidifier for chest congestion and cough.  If you don't have a humidifier, you can sit in the bathroom with the hot shower running.      For sore throat: try warm salt water gargles, Mucinex sore throat cough drops or cepacol lozenges, throat spray, warm tea or water with lemon/honey, popsicles or ice, or OTC cold relief medicine for throat discomfort. You can also purchase chloraseptic spray at the pharmacy or dollar store.   For congestion: take a daily anti-histamine like Zyrtec, Claritin, and a oral decongestant, such as pseudoephedrine.  You can also use Flonase 1-2 sprays in each nostril daily. Afrin is also a good option, if you [name] not have high blood pressure.    It is important to stay hydrated: drink plenty of fluids (water, gatorade/powerade/pedialyte, juices, or teas) to keep your throat moisturized and help further relieve irritation/discomfort.    Return or go to the Emergency Department if symptoms worsen or [name] not improve in the next few days      ED Prescriptions     Medication Sig Dispense Auth. Provider   molnupiravir EUA (LAGEVRIO) 200 mg CAPS capsule Take 4 capsules (800 mg total) by mouth 2 (two) times daily for 5 days. 40 capsule Halim Surrette, [name]      PDMP not reviewed this encounter.   Leah Hensen, [name] 08/30/22 1955

## 2022-08-30 NOTE — Telephone Encounter (Signed)
See phone note patient was assisted in setting up an e-visit with [facility].

## 2022-08-30 NOTE — Telephone Encounter (Signed)
I spoke with pt; pt is scheduling appt with [facility] video visit now an pt said if is unable to get VV or if that provider thinks needs in person visit due to cough pt will go to [facility] [place]. UC & ED precautions given and pt voiced understanding. Pt said symptoms started on 08/28/22 with dry cough and pt having cough that has kept her awake at night; head and chest congestion. No CP,SOB or wheezing.no H/A but slight dizziness when first standing. Pt has been taking alka seltzer plus. Covid + on 08/29/22.Pt said she will [facility] with [facility] and if needs to be seen in person will go to [facility]. UC & ED precautions given and pt voiced understanding. Sending note to Romilda Garret NP and [place] pool.

## 2022-08-30 NOTE — ED Triage Notes (Signed)
Patient states that she started having cough, congestion and runny nose that started on Wed.  Patient states that she did a home covid test yesterday and was positive.  Patient denies fevers.

## 2022-08-30 NOTE — Telephone Encounter (Signed)
Patient called and stated she tested positive for covid and wanted to know if she can get some medication for it. Call back number [phone]

## 2022-09-12 ENCOUNTER — Other Ambulatory Visit: Payer: Self-pay | Admitting: Family

## 2022-09-12 DIAGNOSIS — I10 Essential (primary) hypertension: Secondary | ICD-10-CM

## 2022-09-27 ENCOUNTER — Telehealth: Payer: PPO | Admitting: Physician Assistant

## 2022-09-27 ENCOUNTER — Encounter: Payer: Self-pay | Admitting: Physician Assistant

## 2022-09-27 DIAGNOSIS — J019 Acute sinusitis, unspecified: Secondary | ICD-10-CM

## 2022-09-27 DIAGNOSIS — B9689 Other specified bacterial agents as the cause of diseases classified elsewhere: Secondary | ICD-10-CM | POA: Diagnosis not present

## 2022-09-27 MED ORDER — AMOXICILLIN-POT CLAVULANATE 875-125 MG PO TABS: 1.0000 | ORAL_TABLET | Freq: Two times a day (BID) | ORAL | 0 refills | Status: DC

## 2022-09-27 NOTE — Progress Notes (Unsigned)

## 2022-10-04 ENCOUNTER — Ambulatory Visit (INDEPENDENT_AMBULATORY_CARE_PROVIDER_SITE_OTHER): Payer: PPO

## 2022-10-04 VITALS — Wt 169.0 lb

## 2022-10-04 DIAGNOSIS — Z0000 Encounter for general adult medical examination without abnormal findings: Secondary | ICD-10-CM

## 2022-10-04 NOTE — Progress Notes (Signed)
I connected with  [name] on 10/04/22 by a audio enabled telemedicine application and verified that I am speaking with the correct person using two identifiers.  Patient Location: Home  Provider Location: Home Office  I discussed the limitations of evaluation and management by telemedicine. The patient expressed understanding and agreed to proceed.   Subjective:   CHRYSTA FULCHER is a 67 y.o. female who presents for an Initial Medicare Annual Wellness Visit.  Review of Systems     Cardiac Risk Factors include: advanced age (>52mn, >>103women);dyslipidemia;hypertension     Objective:    Today's Vitals   10/04/22 0928  Weight: 169 lb (76.7 kg)   Body mass index is 27.7 kg/m.     10/04/2022    9:37 AM 08/30/2022    4:13 PM 06/11/2022   11:32 AM 03/15/2019    8:18 AM 02/01/2019    8:51 AM 01/28/2019   12:13 PM 06/17/2017    9:11 AM  Advanced Directives  Does Patient Have a Medical Advance Directive? No No No No No No No  Would patient like information on creating a medical advance directive? No - Patient declined   No - Patient declined No - Patient declined  Yes (MAU/Ambulatory/Procedural Areas - Information given)    Current Medications (verified) Outpatient Encounter Medications as of 10/04/2022  Medication Sig   amLODipine (NORVASC) 2.5 MG tablet TAKE 1 TABLET BY MOUTH EVERY DAY   azelastine (ASTELIN) 0.1 % nasal spray SPRAY 1-2 SPRAYS IN EACH NOSTRIL TWICE DAILY.   buPROPion (WELLBUTRIN XL) 150 MG 24 hr tablet Take 1 tablet (150 mg total) by mouth daily.   escitalopram (LEXAPRO) 20 MG tablet TAKE 1 TABLET BY MOUTH EVERY DAY   Evolocumab (REPATHA SURECLICK) 1865MG/ML SOAJ INJECT 1 ML INTO THE SKIN EVERY 14 (FOURTEEN) DAYS.   fluticasone (FLONASE) 50 MCG/ACT nasal spray Place 2 sprays into both nostrils daily.   ibuprofen (ADVIL) 800 MG tablet Take 1 tablet (800 mg total) by mouth every 8 (eight) hours as needed.   MAGNESIUM GLUCONATE PO Take 100 mg by mouth.    Multiple Vitamin (MULTIVITAMIN) tablet Take 1 tablet by mouth daily.   omeprazole (PRILOSEC) 20 MG capsule TAKE 1 CAPSULE BY MOUTH EVERY DAY   Omega-3 Fatty Acids (OMEGA 3 500 PO) Take by mouth. (Patient not taking: Reported on 10/04/2022)   [DISCONTINUED] amoxicillin-clavulanate (AUGMENTIN) 875-125 MG tablet Take 1 tablet by mouth 2 (two) times daily for 7 days.   [DISCONTINUED] promethazine-dextromethorphan (PROMETHAZINE-DM) 6.25-15 MG/5ML syrup Take 5 mLs by mouth 4 (four) times daily as needed.   No facility-administered encounter medications on file as of 10/04/2022.    Allergies (verified) Ezetimibe, Lisinopril, Statins, and Losartan   History: Past Medical History:  Diagnosis Date   Anemia    Anxiety    Blood transfusion without reported diagnosis    [place]   Right   COVID-19 08/30/2020   GERD (gastroesophageal reflux disease)    Hypercholesterolemia    Hypertension    Migraine    Hx of in past   Personal history of radiation therapy 2008   Right   Wears dentures    partial upper and lower   Past Surgical History:  Procedure Laterality Date   ABDOMINAL HYSTERECTOMY     BREAST BIOPSY Right 02/27/2007   BREAST LUMPECTOMY Right 04/02/2007   BREAST SURGERY  01/14/2013   breast reduction left side   BROW LIFT Bilateral 06/17/2017   Procedure: BLEPHAROPLASTY UPPER EYELID  WITH EXCESS SKIN;  Surgeon: Karle Starch, MD;  Location: [facility];  Service: Ophthalmology;  Laterality: Bilateral;   BROW LIFT Bilateral 11/30/2021   Procedure: BROW PTOSIS REPAIR BILATERAL;  Surgeon: Karle Starch, MD;  Location: [facility];  Service: Ophthalmology;  Laterality: Bilateral;   CESAREAN SECTION     two times   COLONOSCOPY     FOOT SURGERY Right    PTOSIS REPAIR Bilateral 06/17/2017   Procedure: PTOSIS REPAIR RESECT EX;  Surgeon: Karle Starch, MD;  Location: [place];  Service: Ophthalmology;  Laterality: Bilateral;   REDUCTION  MAMMAPLASTY Left    SHOULDER ARTHROSCOPY WITH ROTATOR CUFF REPAIR AND OPEN BICEPS TENODESIS Right 02/01/2019   Procedure: RIGHT SHOULDER ARTHROSCOPY, DEBRIDEMENT, BICEPS TENODESIS, MINI ROTATOR CUFF TEAR REPAIR;  Surgeon: Meredith Pel, MD;  Location: [facility];  Service: Orthopedics;  Laterality: Right;   Family History  Problem Relation Age of Onset   Thyroid disease Mother    Hypertension Mother    Hyperlipidemia Mother    Alzheimer's disease Mother    Heart failure Mother    Esophageal cancer Father    Hyperlipidemia Father    Heart murmur Father    Colon polyps Father    COPD Father    Aneurysm Father    Throat cancer Father    Diabetes Sister    Breast cancer Sister    Irritable bowel syndrome Sister    Breast cancer Sister    Ulcerative colitis Sister    Rheum arthritis Sister    Alzheimer's disease Maternal Aunt    Alzheimer's disease Maternal Uncle    Diabetes Paternal Uncle    Alzheimer's disease Maternal Grandmother    Diabetes Paternal Grandmother    Breast cancer Cousin    Breast cancer Cousin    Colon cancer Neg Hx    Stomach cancer Neg Hx    Rectal cancer Neg Hx    Social History   Socioeconomic History   Marital status: Married    Spouse name: [name]   Number of children: 2   Years of education: high school   Highest education level: Not on file  Occupational History   Not on file  Tobacco Use   Smoking status: Never   Smokeless tobacco: Never  Vaping Use   Vaping Use: Never used  Substance and Sexual Activity   Alcohol use: Not Currently   Drug use: No   Sexual activity: Yes    Partners: Male    Birth control/protection: Surgical  Other Topics Concern   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      Family: 2 daughters - [name] and [name] - 3 grandchildren       Enjoys: boating, walking, beach, fishing, outdoor activity      Exercise: keeps busy moving around the house, irregular   Diet: chicken, some read meat, veggies, salads - trying to limit sugar      Safety   Seat belts: Yes    Guns: Yes  and secure   Safe in relationships: Yes    Social Determinants of Health   Financial Resource Strain: Low Risk  (10/04/2022)   Overall Financial Resource Strain (CARDIA)    Difficulty of Paying Living Expenses: Not hard at all  Food Insecurity: No  Food Insecurity (10/04/2022)   Hunger Vital Sign    Worried About Running Out of Food in the Last Year: Never true    Ran Out of Food in the Last Year: Never true  Transportation Needs: No Transportation Needs (10/04/2022)   PRAPARE - Hydrologist (Medical): No    Lack of Transportation (Non-Medical): No  Physical Activity: Sufficiently Active (10/04/2022)   Exercise Vital Sign    Days of Exercise per Week: 5 days    Minutes of Exercise per Session: 60 min  Stress: No Stress Concern Present (10/04/2022)   [place]    Feeling of Stress : Not at all  Social Connections: [place] (10/04/2022)   Social Connection and Isolation Panel [NHANES]    Frequency of Communication with Friends and Family: More than three times a week    Frequency of Social Gatherings with Friends and Family: More than three times a week    Attends Religious Services: More than 4 times per year    Active Member of [employer] or Organizations: Yes    Attends Archivist Meetings: 1 to 4 times per year    Marital Status: Married    Tobacco Counseling Counseling given: Not Answered   Clinical Intake:  Pre-visit preparation completed: Yes  Pain : No/denies pain     BMI - recorded: 27.7 Nutritional Status: BMI 25 -29 Overweight Nutritional Risks: None Diabetes: No  How often do you need to have someone help you when  you read instructions, pamphlets, or other written materials from your doctor or pharmacy?: 1 - Never  Diabetic?no  Interpreter Needed?: No  Information entered by :: Charlott Rakes, LPN   Activities of Daily Living    10/04/2022    9:39 AM  In your present state of health, do you have any difficulty performing the following activities:  Hearing? 0  Vision? 0  Difficulty concentrating or making decisions? 0  Walking or climbing stairs? 0  Dressing or bathing? 0  Doing errands, shopping? 0  Preparing Food and eating ? N  Using the Toilet? N  In the past six months, have you accidently leaked urine? N  Do you have problems with loss of bowel control? N  Managing your Medications? N  Managing your Finances? N  Housekeeping or managing your Housekeeping? N    Patient Care Team: [name], FNP as PCP - General (Family Medicine) End, Harrell Gave, MD as PCP - Cardiology (Cardiology)  Indicate any recent Medical Services you may have received from other than [facility] providers in the past year (date may be approximate).     Assessment:   This is a routine wellness examination for Jace.  Hearing/Vision screen Hearing Screening - Comments:: Pt denies any hearing issues  Vision Screening - Comments:: Pt follows up with Dr Benay Pillow for annual eye exaMS   Dietary issues and exercise activities discussed: Current Exercise Habits: Home exercise routine, Type of exercise: walking, Time (Minutes): 60, Frequency (Times/Week): 5, Weekly Exercise (Minutes/Week): 300   Goals Addressed             This Visit's Progress    Patient Stated       Change eating habits        Depression Screen    10/04/2022    9:35 AM 10/01/2021   10:33 AM 10/01/2021    9:55 AM 09/27/2020   10:03 AM 01/25/2020    4:02 PM 06/09/2019  11:56 AM 01/28/2019    8:30 AM  PHQ 2/9 Scores  PHQ - 2 Score 0 0 0 0 3 0   PHQ- 9 Score   0  3    Exception Documentation       Other- indicate reason in  comment box  Not completed       Acute visit    Fall Risk    10/04/2022    9:39 AM 10/01/2021    9:56 AM 01/25/2020    4:02 PM 12/15/2018    8:11 AM 05/25/2018   11:43 AM  Fall Risk   Falls in the past year? 0 0 0 0 No  Number falls in past yr: 0 0 0    Injury with Fall? 0 0 0    Risk for fall due to : Impaired vision  No Fall Risks    Follow up Falls prevention discussed  Education provided Falls evaluation completed     [place]:  Any stairs in or around the home? Yes  If so, are there any without handrails? No  Home free of loose throw rugs in walkways, pet beds, electrical cords, etc? Yes  Adequate lighting in your home to reduce risk of falls? Yes   ASSISTIVE DEVICES UTILIZED TO PREVENT FALLS:  Life alert? No  Use of a cane, walker or w/c? No  Grab bars in the bathroom? No  Shower chair or bench in shower? No  Elevated toilet seat or a handicapped toilet? No   TIMED UP AND GO:  Was the test performed? No .   Cognitive Function:        10/04/2022    9:40 AM  6CIT Screen  What Year? 0 points  What month? 0 points  What time? 0 points  Count back from 20 0 points  Months in reverse 0 points  Repeat phrase 0 points  Total Score 0 points    Immunizations Immunization History  Administered Date(s) Administered   Fluad Quad(high Dose 65+) 10/01/2021   Influenza,inj,Quad PF,6+ Mos 05/11/2014, 05/15/2015, 05/16/2016, 09/27/2020   PNEUMOCOCCAL CONJUGATE-20 10/01/2021   Tdap 08/31/2013    TDAP status: Up to date  Flu Vaccine status: Up to date  Pneumococcal vaccine status: Up to date  Covid-19 vaccine status: Declined, Education has been provided regarding the importance of this vaccine but patient still declined. Advised may receive this vaccine at local pharmacy or Health Dept.or vaccine clinic. Aware to provide a copy of the vaccination record if obtained from local pharmacy or Health Dept. Verbalized acceptance and  understanding.  Qualifies for Shingles Vaccine? Yes   Zostavax completed No   Shingrix Completed?: No.    Education has been provided regarding the importance of this vaccine. Patient has been advised to call insurance company to determine out of pocket expense if they have not yet received this vaccine. Advised may also receive vaccine at local pharmacy or Health Dept. Verbalized acceptance and understanding.  Screening Tests Health Maintenance  Topic Date Due   Hepatitis C Screening  Never done   Zoster Vaccines- Shingrix (1 of 2) Never done   DEXA SCAN  08/29/2021   INFLUENZA VACCINE  12/15/2022 (Originally 04/16/2022)   Fecal DNA (Cologuard)  10/05/2023 (Originally 06/17/2021)   DTaP/Tdap/Td (2 - Td or Tdap) 09/01/2023   Medicare Annual Wellness (AWV)  10/05/2023   MAMMOGRAM  12/25/2023   Pneumonia Vaccine 72+ Years old  Completed   HPV VACCINES  Aged Out  COVID-19 Vaccine  Discontinued    Health Maintenance  Health Maintenance Due  Topic Date Due   Hepatitis C Screening  Never done   Zoster Vaccines- Shingrix (1 of 2) Never done   DEXA SCAN  08/29/2021    Colorectal cancer screening: Type of screening: Colonoscopy. Completed 03/06/22. Repeat every 5-10 years  Mammogram status: Completed 12/24/21. Repeat every year    Additional Screening:  Hepatitis C Screening: does qualify;  Vision Screening: Recommended annual ophthalmology exams for early detection of glaucoma and other disorders of the eye. Is the patient up to date with their annual eye exam?  Yes  Who is the provider or what is the name of the office in which the patient attends annual eye exams? Dr Edison Pace  If pt is not established with a provider, would they like to be referred to a provider to establish care? No .   Dental Screening: Recommended annual dental exams for proper oral hygiene  Community Resource Referral / Chronic Care Management: CRR required this visit?  No   CCM required this visit?  No       Plan:     I have personally reviewed and noted the following in the patient's chart:   Medical and social history Use of alcohol, tobacco or illicit drugs  Current medications and supplements including opioid prescriptions. Patient is not currently taking opioid prescriptions. Functional ability and status Nutritional status Physical activity Advanced directives List of other physicians Hospitalizations, surgeries, and ER visits in previous 12 months Vitals Screenings to include cognitive, depression, and falls Referrals and appointments  In addition, I have reviewed and discussed with patient certain preventive protocols, quality metrics, and best practice recommendations. A written personalized care plan for preventive services as well as general preventive health recommendations were provided to patient.     Willette Brace, LPN   [id number]   Nurse Notes: none

## 2022-10-04 NOTE — Patient Instructions (Signed)
[name] , Thank you for taking time to come for your Medicare Wellness Visit. I appreciate your ongoing commitment to your health goals. Please review the following plan we discussed and let me know if I can assist you in the future.   These are the goals we discussed:  Goals      Patient Stated     Change eating habits         This is a list of the screening recommended for you and due dates:  Health Maintenance  Topic Date Due   Hepatitis C Screening: USPSTF Recommendation to screen - Ages 29-79 yo.  Never done   Zoster (Shingles) Vaccine (1 of 2) Never done   DEXA scan (bone density measurement)  08/29/2021   Flu Shot  12/15/2022*   Cologuard (Stool DNA test)  10/05/2023*   DTaP/Tdap/Td vaccine (2 - Td or Tdap) 09/01/2023   Medicare Annual Wellness Visit  10/05/2023   Mammogram  12/25/2023   Pneumonia Vaccine  Completed   HPV Vaccine  Aged Out   COVID-19 Vaccine  Discontinued  *Topic was postponed. The date shown is not the original due date.    Advanced directives: Advance directive discussed with you today. Even though you declined this today please call our office should you change your mind and we can give you the proper paperwork for you to fill out.  Conditions/risks identified: better eating habits   Next appointment: Follow up in one year for your annual wellness visit    Preventive Care 65 Years and Older, Female Preventive care refers to lifestyle choices and visits with your health care provider that can promote health and wellness. What does preventive care include? A yearly physical exam. This is also called an annual well check. Dental exams once or twice a year. Routine eye exams. Ask your health care provider how often you should have your eyes checked. Personal lifestyle choices, including: Daily care of your teeth and gums. Regular physical activity. Eating a healthy diet. Avoiding tobacco and drug use. Limiting alcohol use. Practicing safe  sex. Taking low-dose aspirin every day. Taking vitamin and mineral supplements as recommended by your health care provider. What happens during an annual well check? The services and screenings done by your health care provider during your annual well check will depend on your age, overall health, lifestyle risk factors, and family history of disease. Counseling  Your health care provider may ask you questions about your: Alcohol use. Tobacco use. Drug use. Emotional well-being. Home and relationship well-being. Sexual activity. Eating habits. History of falls. Memory and ability to understand (cognition). Work and work Statistician. Reproductive health. Screening  You may have the following tests or measurements: Height, weight, and BMI. Blood pressure. Lipid and cholesterol levels. These may be checked every 5 years, or more frequently if you are over 38 years old. Skin check. Lung cancer screening. You may have this screening every year starting at age 32 if you have a 30-pack-year history of smoking and currently smoke or have quit within the past 15 years. Fecal occult blood test (FOBT) of the stool. You may have this test every year starting at age 52. Flexible sigmoidoscopy or colonoscopy. You may have a sigmoidoscopy every 5 years or a colonoscopy every 10 years starting at age 64. Hepatitis C blood test. Hepatitis B blood test. Sexually transmitted disease (STD) testing. Diabetes screening. This is done by checking your blood sugar (glucose) after you have not eaten for a while (fasting).  You may have this done every 1-3 years. Bone density scan. This is done to screen for osteoporosis. You may have this done starting at age 48. Mammogram. This may be done every 1-2 years. Talk to your health care provider about how often you should have regular mammograms. Talk with your health care provider about your test results, treatment options, and if necessary, the need for more  tests. Vaccines  Your health care provider may recommend certain vaccines, such as: Influenza vaccine. This is recommended every year. Tetanus, diphtheria, and acellular pertussis (Tdap, Td) vaccine. You may need a Td booster every 10 years. Zoster vaccine. You may need this after age 10. Pneumococcal 13-valent conjugate (PCV13) vaccine. One dose is recommended after age 40. Pneumococcal polysaccharide (PPSV23) vaccine. One dose is recommended after age 8. Talk to your health care provider about which screenings and vaccines you need and how often you need them. This information is not intended to replace advice given to you by your health care provider. Make sure you discuss any questions you have with your health care provider. Document Released: 09/29/2015 Document Revised: 05/22/2016 Document Reviewed: 07/04/2015 Elsevier Interactive Patient Education  2017 [place] Prevention in the Home Falls can cause injuries. They can happen to people of all ages. There are many things you can do to make your home safe and to help prevent falls. What can I do on the outside of my home? Regularly fix the edges of walkways and driveways and fix any cracks. Remove anything that might make you trip as you walk through a door, such as a raised step or threshold. Trim any bushes or trees on the path to your home. Use bright outdoor lighting. Clear any walking paths of anything that might make someone trip, such as rocks or tools. Regularly check to see if handrails are loose or broken. Make sure that both sides of any steps have handrails. Any raised decks and porches should have guardrails on the edges. Have any leaves, snow, or ice cleared regularly. Use sand or salt on walking paths during winter. Clean up any spills in your garage right away. This includes oil or grease spills. What can I do in the bathroom? Use night lights. Install grab bars by the toilet and in the tub and shower.  Do not use towel bars as grab bars. Use non-skid mats or decals in the tub or shower. If you need to sit down in the shower, use a plastic, non-slip stool. Keep the floor dry. Clean up any water that spills on the floor as soon as it happens. Remove soap buildup in the tub or shower regularly. Attach bath mats securely with double-sided non-slip rug tape. Do not have throw rugs and other things on the floor that can make you trip. What can I do in the bedroom? Use night lights. Make sure that you have a light by your bed that is easy to reach. Do not use any sheets or blankets that are too big for your bed. They should not hang down onto the floor. Have a firm chair that has side arms. You can use this for support while you get dressed. Do not have throw rugs and other things on the floor that can make you trip. What can I do in the kitchen? Clean up any spills right away. Avoid walking on wet floors. Keep items that you use a lot in easy-to-reach places. If you need to reach something above you, use a  strong step stool that has a grab bar. Keep electrical cords out of the way. Do not use floor polish or wax that makes floors slippery. If you must use wax, use non-skid floor wax. Do not have throw rugs and other things on the floor that can make you trip. What can I do with my stairs? Do not leave any items on the stairs. Make sure that there are handrails on both sides of the stairs and use them. Fix handrails that are broken or loose. Make sure that handrails are as long as the stairways. Check any carpeting to make sure that it is firmly attached to the stairs. Fix any carpet that is loose or worn. Avoid having throw rugs at the top or bottom of the stairs. If you do have throw rugs, attach them to the floor with carpet tape. Make sure that you have a light switch at the top of the stairs and the bottom of the stairs. If you do not have them, ask someone to add them for you. What else  can I do to help prevent falls? Wear shoes that: Do not have high heels. Have rubber bottoms. Are comfortable and fit you well. Are closed at the toe. Do not wear sandals. If you use a stepladder: Make sure that it is fully opened. Do not climb a closed stepladder. Make sure that both sides of the stepladder are locked into place. Ask someone to hold it for you, if possible. Clearly mark and make sure that you can see: Any grab bars or handrails. First and last steps. Where the edge of each step is. Use tools that help you move around (mobility aids) if they are needed. These include: Canes. Walkers. Scooters. Crutches. Turn on the lights when you go into a dark area. Replace any light bulbs as soon as they burn out. Set up your furniture so you have a clear path. Avoid moving your furniture around. If any of your floors are uneven, fix them. If there are any pets around you, be aware of where they are. Review your medicines with your doctor. Some medicines can make you feel dizzy. This can increase your chance of falling. Ask your doctor what other things that you can do to help prevent falls. This information is not intended to replace advice given to you by your health care provider. Make sure you discuss any questions you have with your health care provider. Document Released: 06/29/2009 Document Revised: 02/08/2016 Document Reviewed: 10/07/2014 Elsevier Interactive Patient Education  2017 [employer].

## 2022-10-07 ENCOUNTER — Ambulatory Visit (INDEPENDENT_AMBULATORY_CARE_PROVIDER_SITE_OTHER): Payer: PPO | Admitting: Family

## 2022-10-07 ENCOUNTER — Encounter: Payer: Self-pay | Admitting: Family

## 2022-10-07 VITALS — BP 130/70 | HR 72 | Temp 98.6°F | Ht 65.0 in | Wt 173.0 lb

## 2022-10-07 DIAGNOSIS — Z6828 Body mass index (BMI) 28.0-28.9, adult: Secondary | ICD-10-CM | POA: Insufficient documentation

## 2022-10-07 DIAGNOSIS — N941 Unspecified dyspareunia: Secondary | ICD-10-CM | POA: Diagnosis not present

## 2022-10-07 DIAGNOSIS — Z1231 Encounter for screening mammogram for malignant neoplasm of breast: Secondary | ICD-10-CM

## 2022-10-07 DIAGNOSIS — E663 Overweight: Secondary | ICD-10-CM | POA: Diagnosis not present

## 2022-10-07 DIAGNOSIS — R748 Abnormal levels of other serum enzymes: Secondary | ICD-10-CM | POA: Diagnosis not present

## 2022-10-07 DIAGNOSIS — F419 Anxiety disorder, unspecified: Secondary | ICD-10-CM | POA: Diagnosis not present

## 2022-10-07 DIAGNOSIS — E782 Mixed hyperlipidemia: Secondary | ICD-10-CM | POA: Diagnosis not present

## 2022-10-07 DIAGNOSIS — Z0001 Encounter for general adult medical examination with abnormal findings: Secondary | ICD-10-CM | POA: Insufficient documentation

## 2022-10-07 DIAGNOSIS — Z780 Asymptomatic menopausal state: Secondary | ICD-10-CM | POA: Diagnosis not present

## 2022-10-07 DIAGNOSIS — E7801 Familial hypercholesterolemia: Secondary | ICD-10-CM | POA: Diagnosis not present

## 2022-10-07 DIAGNOSIS — I10 Essential (primary) hypertension: Secondary | ICD-10-CM | POA: Diagnosis not present

## 2022-10-07 LAB — MICROALBUMIN / CREATININE URINE RATIO
Creatinine,U: 33 mg/dL
Microalb Creat Ratio: 2.1 mg/g (ref 0.0–30.0)
Microalb, Ur: <0.7 mg/dL (ref 0.0–1.9)

## 2022-10-07 MED ORDER — PREMARIN 0.625 MG/GM VA CREA: TOPICAL_CREAM | VAGINAL | 5 refills | Status: DC

## 2022-10-07 NOTE — Assessment & Plan Note (Signed)
Stable

## 2022-10-07 NOTE — Patient Instructions (Addendum)
------------------------------------   I have sent an electronic order over to your preferred location for the following:   '[]'$   2D Mammogram  '[x]'$   3D Mammogram  '[x]'$   Bone Density   Please give this center a call to get scheduled at your convenience.   '[x]'$   [facility]      [place], [place]         Make sure to wear two piece  clothing  No lotions powders or deodorants the day of the appointment Make sure to bring picture ID and insurance card.  Bring list of medications you are currently taking including any supplements.   ------------------------------------  Urine microalbumin today   Recommendations on keeping yourself healthy:  - Exercise at least 30-45 minutes a day, 3-4 days a week.  - Eat a low-fat diet with lots of fruits and vegetables, up to 7-9 servings per day.  - Seatbelts can save your life. Wear them always.  - Smoke detectors on every level of your home, check batteries every year.  - Eye Doctor - have an eye exam every 1-2 years  - Safe sex - if you may be exposed to STDs, use a condom.  - Alcohol -  If you drink, do it moderately, less than 2 drinks per day.  - Dale. Choose someone to speak for you if you are not able.  - Depression is common in our stressful world.If you're feeling down or losing interest in things you normally enjoy, please come in for a visit.  - Violence - If anyone is threatening or hurting you, please call immediately.  Due to recent changes in healthcare laws, you may see results of your imaging and/or laboratory studies on MyChart before I have had a chance to review them.  I understand that in some cases there may be results that are confusing or concerning to you. Please understand that not all results are received at the same time and often I may need to interpret multiple results in order to provide you with the best plan of care or course of  treatment. Therefore, I ask that you please give me 2 business days to thoroughly review all your results before contacting my office for clarification. Should we see a critical lab result, you will be contacted sooner.   I will see you again in one year for your annual comprehensive exam unless otherwise stated and or with acute concerns.  It was a pleasure seeing you today! Please do not hesitate to reach out with any questions and or concerns.  Regards,   [name]

## 2022-10-07 NOTE — Progress Notes (Signed)
Subjective:  Patient ID: [name], female    DOB: 04/11/1956  Age: 67 y.o. MRN: [id number]  Patient Care Team: [name], [place] as PCP - General (Family Medicine) End, Harrell Gave, MD as PCP - Cardiology (Cardiology)   CC:  Chief Complaint  Patient presents with   Annual Exam    HPI [name] is a 67 y.o. female who presents today for a well woman exam. She reports consuming a low cholesterol and low carb diet.also trying to cut down on sugar intake. Trying to work on walking stairs and also walking a few times a week.    She generally feels well. She reports sleeping well. She does have additional problems to discuss today. Vaginal burning after sex, uses lubrication which helps slightly however still with burning at times. Denies any white discoloration to skin and no lesions to site.   Vision:Within last year Dental:Receives regular dental care STD:The patient denies history of sexually transmitted disease.  Lung Cancer Screening with low-dose Chest CT: no smoking history.  - Adults age 40-80 who are current cigarette smokers or quit within the last 15 years. Must have 20 pack year history.  AAA Screening: n/a - Men age 105-75 who have ever smoked  Mammogram: 12/24/21  Last pap: >65 y/o  Colonoscopy: 03/06/2022 Bone density scan: 2010   Pt is without acute concerns.   Advanced Directives Patient does not have advanced directives including DNR, living will, and healthcare power of attorney. She does not have a copy in the electronic medical record.   DEPRESSION SCREENING    10/07/2022   12:33 PM 10/04/2022    9:35 AM 10/01/2021   10:33 AM 10/01/2021    9:55 AM 09/27/2020   10:03 AM 01/25/2020    4:02 PM 06/09/2019   11:56 AM  PHQ 2/9 Scores  PHQ - 2 Score 0 0 0 0 0 3 0  PHQ- 9 Score 0   0  3      ROS: Negative unless specifically indicated above in HPI.    Current Outpatient Medications:    amLODipine (NORVASC) 2.5 MG tablet, TAKE 1 TABLET BY MOUTH  EVERY DAY, Disp: 90 tablet, Rfl: 0   azelastine (ASTELIN) 0.1 % nasal spray, SPRAY 1-2 SPRAYS IN EACH NOSTRIL TWICE DAILY., Disp: , Rfl:    buPROPion (WELLBUTRIN XL) 150 MG 24 hr tablet, Take 1 tablet (150 mg total) by mouth daily., Disp: 90 tablet, Rfl: 0   escitalopram (LEXAPRO) 20 MG tablet, TAKE 1 TABLET BY MOUTH EVERY DAY, Disp: 90 tablet, Rfl: 2   Evolocumab (REPATHA SURECLICK) 270 MG/ML SOAJ, INJECT 1 ML INTO THE SKIN EVERY 14 (FOURTEEN) DAYS., Disp: 6 mL, Rfl: 2   fluticasone (FLONASE) 50 MCG/ACT nasal spray, Place 2 sprays into both nostrils daily., Disp: 16 g, Rfl: 6   ibuprofen (ADVIL) 800 MG tablet, Take 1 tablet (800 mg total) by mouth every 8 (eight) hours as needed., Disp: 90 tablet, Rfl: 0   MAGNESIUM GLUCONATE PO, Take 100 mg by mouth., Disp: , Rfl:    Multiple Vitamin (MULTIVITAMIN) tablet, Take 1 tablet by mouth daily., Disp: , Rfl:    omeprazole (PRILOSEC) 20 MG capsule, TAKE 1 CAPSULE BY MOUTH EVERY DAY, Disp: 90 capsule, Rfl: 0    Objective:    BP 130/70   Pulse 72   Temp 98.6 F (37 C) (Oral)   Ht '5\' 5"'$  (1.651 m)   Wt 173 lb (78.5 kg)   SpO2 98%   BMI  28.79 kg/m   BP Readings from Last 3 Encounters:  10/07/22 130/70  08/30/22 (!) 134/96  06/18/22 (!) 158/96      Physical Exam Constitutional:      General: She is not in acute distress.    Appearance: Normal appearance. She is normal weight. She is not ill-appearing.  HENT:     Head: Normocephalic.     Right Ear: Tympanic membrane normal.     Left Ear: Tympanic membrane normal.     Nose: Nose normal.     Mouth/Throat:     Mouth: Mucous membranes are moist.  Eyes:     Extraocular Movements: Extraocular movements intact.     Pupils: Pupils are equal, round, and reactive to light.  Cardiovascular:     Rate and Rhythm: Normal rate and regular rhythm.  Pulmonary:     Effort: Pulmonary effort is normal.     Breath sounds: Normal breath sounds.  Abdominal:     General: Abdomen is flat. Bowel sounds are  normal.     Palpations: Abdomen is soft.     Tenderness: There is no guarding or rebound.  Musculoskeletal:        General: Normal range of motion.     Cervical back: Normal range of motion.  Skin:    General: Skin is warm.     Capillary Refill: Capillary refill takes less than 2 seconds.  Neurological:     General: No focal deficit present.     Mental Status: She is alert.  Psychiatric:        Mood and Affect: Mood normal.        Behavior: Behavior normal.        Thought Content: Thought content normal.        Judgment: Judgment normal.          Assessment & Plan:  Screening mammogram for breast cancer -     3D Screening Mammogram, Left and Right; Future  Postmenopausal Assessment & Plan: Bone density ordered pending results.    Orders: -     DG Bone Density; Future  Overweight with body mass index (BMI) of 28 to 28.9 in adult Assessment & Plan: Pt advised to work on diet and exercise as tolerated    Mixed hyperlipidemia  Primary hypertension Assessment & Plan: Stable. Continue medications as prescribed.  Orders: -     Microalbumin / creatinine urine ratio  Dyspareunia in female Assessment & Plan: Suspected vaginal atrophy however pt high risk candidate for hormone replacement with premarin, so recommendation is to try replens over the counter for now.   Elevated vitamin B12 level Assessment & Plan: Seen by hematology, stable for now. Will continue to monitor to see if upward trend.   Encounter for general adult medical examination with abnormal findings Assessment & Plan: Patient Counseling(The following topics were reviewed):  Preventative care handout given to pt  Health maintenance and immunizations reviewed. Please refer to Health maintenance section. Pt advised on safe sex, wearing seatbelts in car, and proper nutrition labwork ordered today for annual Dental health: Discussed importance of regular tooth brushing, flossing, and dental  visits.    Anxiety Assessment & Plan: Stable.    Heterozygous familial hypercholesterolemia Assessment & Plan: Stable.  Pt advised to:  Work on low cholesterol diet and exercise as tolerated        Follow-up: Return in about 6 months (around 04/07/2023) for f/u cholesterol.   [name], FNP

## 2022-10-07 NOTE — Assessment & Plan Note (Signed)
Patient Counseling(The following topics were reviewed): ? Preventative care handout given to pt  ?Health maintenance and immunizations reviewed. Please refer to Health maintenance section. ?Pt advised on safe sex, wearing seatbelts in car, and proper nutrition ?labwork ordered today for annual ?Dental health: Discussed importance of regular tooth brushing, flossing, and dental visits. ? ? ?

## 2022-10-07 NOTE — Assessment & Plan Note (Signed)
Stable.  Pt advised to:  Work on low cholesterol diet and exercise as tolerated

## 2022-10-07 NOTE — Assessment & Plan Note (Signed)
Stable.  Continue medications as prescribed. 

## 2022-10-07 NOTE — Assessment & Plan Note (Signed)
Suspected vaginal atrophy however pt high risk candidate for hormone replacement with premarin, so recommendation is to try replens over the counter for now.

## 2022-10-07 NOTE — Assessment & Plan Note (Signed)
Seen by hematology, stable for now. Will continue to monitor to see if upward trend.

## 2022-10-07 NOTE — Assessment & Plan Note (Signed)
Bone density ordered pending results.

## 2022-10-07 NOTE — Assessment & Plan Note (Signed)
Pt advised to work on diet and exercise as tolerated  

## 2022-10-16 ENCOUNTER — Other Ambulatory Visit: Payer: Self-pay | Admitting: Family

## 2022-10-16 DIAGNOSIS — D3131 Benign neoplasm of right choroid: Secondary | ICD-10-CM | POA: Diagnosis not present

## 2022-10-22 ENCOUNTER — Encounter: Payer: Self-pay | Admitting: Family

## 2022-10-22 ENCOUNTER — Other Ambulatory Visit: Payer: Self-pay | Admitting: Family

## 2022-10-22 ENCOUNTER — Ambulatory Visit (INDEPENDENT_AMBULATORY_CARE_PROVIDER_SITE_OTHER): Payer: PPO | Admitting: Family

## 2022-10-22 ENCOUNTER — Ambulatory Visit
Admission: RE | Admit: 2022-10-22 | Discharge: 2022-10-22 | Disposition: A | Discharge status: Home / Self Care | Payer: PPO | Source: Ambulatory Visit | Attending: Family | Admitting: Family

## 2022-10-22 ENCOUNTER — Other Ambulatory Visit: Payer: PPO

## 2022-10-22 VITALS — BP 138/80 | HR 76 | Temp 98.2°F | Ht 65.0 in | Wt 172.2 lb

## 2022-10-22 DIAGNOSIS — R1031 Right lower quadrant pain: Secondary | ICD-10-CM | POA: Insufficient documentation

## 2022-10-22 DIAGNOSIS — K5730 Diverticulosis of large intestine without perforation or abscess without bleeding: Secondary | ICD-10-CM | POA: Diagnosis not present

## 2022-10-22 DIAGNOSIS — K8689 Other specified diseases of pancreas: Secondary | ICD-10-CM

## 2022-10-22 DIAGNOSIS — R1013 Epigastric pain: Secondary | ICD-10-CM

## 2022-10-22 DIAGNOSIS — R1011 Right upper quadrant pain: Secondary | ICD-10-CM | POA: Diagnosis not present

## 2022-10-22 DIAGNOSIS — Z20828 Contact with and (suspected) exposure to other viral communicable diseases: Secondary | ICD-10-CM | POA: Insufficient documentation

## 2022-10-22 LAB — URINALYSIS, ROUTINE W REFLEX MICROSCOPIC
Bilirubin Urine: NEGATIVE
Ketones, ur: NEGATIVE
Leukocytes,Ua: NEGATIVE
Nitrite: NEGATIVE
Specific Gravity, Urine: 1.02 (ref 1.000–1.030)
Total Protein, Urine: NEGATIVE
Urine Glucose: NEGATIVE
Urobilinogen, UA: 0.2 (ref 0.0–1.0)
pH: 6 (ref 5.0–8.0)

## 2022-10-22 LAB — COMPREHENSIVE METABOLIC PANEL
ALT: 12 U/L (ref 0–35)
AST: 15 U/L (ref 0–37)
Albumin: 4.4 g/dL (ref 3.5–5.2)
Alkaline Phosphatase: 70 U/L (ref 39–117)
BUN: 15 mg/dL (ref 6–23)
CO2: 29 mEq/L (ref 19–32)
Calcium: 9.2 mg/dL (ref 8.4–10.5)
Chloride: 104 mEq/L (ref 96–112)
Creatinine, Ser: 0.78 mg/dL (ref 0.40–1.20)
GFR: 79.3 mL/min (ref >60.00)
Glucose, Bld: 100 mg/dL — ABNORMAL HIGH (ref 70–99)
Potassium: 3.9 mEq/L (ref 3.5–5.1)
Sodium: 142 mEq/L (ref 135–145)
Total Bilirubin: 0.4 mg/dL (ref 0.2–1.2)
Total Protein: 7.5 g/dL (ref 6.0–8.3)

## 2022-10-22 LAB — CBC
HCT: 38.4 % (ref 36.0–46.0)
Hemoglobin: 12.9 g/dL (ref 12.0–15.0)
MCHC: 33.4 g/dL (ref 30.0–36.0)
MCV: 84.8 fl (ref 78.0–100.0)
Platelets: 270 10*3/uL (ref 150.0–400.0)
RBC: 4.53 Mil/uL (ref 3.87–5.11)
RDW: 14.5 % (ref 11.5–15.5)
WBC: 6.6 10*3/uL (ref 4.0–10.5)

## 2022-10-22 LAB — AMYLASE: Amylase: 29 U/L (ref 27–131)

## 2022-10-22 LAB — LIPASE: Lipase: 15 U/L (ref 11.0–59.0)

## 2022-10-22 LAB — POCT INFLUENZA A/B
Influenza A, POC: NEGATIVE
Influenza B, POC: NEGATIVE

## 2022-10-22 MED ORDER — IOHEXOL 300 MG/ML  SOLN
100.0000 mL | Freq: Once | INTRAMUSCULAR | Status: AC | PRN
  Administered 2022-10-22: 100 mL via INTRAVENOUS

## 2022-10-22 NOTE — Progress Notes (Signed)
Established Patient Office Visit  Subjective:   Patient ID: [name], female    DOB: 1956/09/07  Age: 67 y.o. MRN: [id number]  CC:  Chief Complaint  Patient presents with   Nausea    2 negative covid test. Feels tired for 5 days.    HPI: [name] is a 67 y.o. female presenting on 10/22/2022 for Nausea (2 negative covid test. Feels tired for 5 days.)     HPI  Four days ago woke up and didn't feel well. Increasd fatigue, body aches, and with some nausea. Has tested herself at home for covid x 2, both negative. C/o nasal congestion, pnd, and abdominal cramping. No diarrhea, constipation, no vomiting.  No sore throat, no sinus pressure, no fever or chills. No cough no chest congestion.   Has taken some otc tylenol and ibuprofen without much relief.   Has increased burping which is out of the ordinary for her. Decreased appetite.   Has not ate or drink at all today.      ROS: Negative unless specifically indicated above in HPI.   Relevant past medical history reviewed and updated as indicated.   Allergies and medications reviewed and updated.   Current Outpatient Medications:    amLODipine (NORVASC) 2.5 MG tablet, TAKE 1 TABLET BY MOUTH EVERY DAY, Disp: 90 tablet, Rfl: 0   azelastine (ASTELIN) 0.1 % nasal spray, SPRAY 1-2 SPRAYS IN EACH NOSTRIL TWICE DAILY., Disp: , Rfl:    buPROPion (WELLBUTRIN XL) 150 MG 24 hr tablet, Take 1 tablet (150 mg total) by mouth daily., Disp: 90 tablet, Rfl: 0   escitalopram (LEXAPRO) 20 MG tablet, TAKE 1 TABLET BY MOUTH EVERY DAY, Disp: 90 tablet, Rfl: 3   Evolocumab (REPATHA SURECLICK) 287 MG/ML SOAJ, INJECT 1 ML INTO THE SKIN EVERY 14 (FOURTEEN) DAYS., Disp: 6 mL, Rfl: 2   fluticasone (FLONASE) 50 MCG/ACT nasal spray, Place 2 sprays into both nostrils daily., Disp: 16 g, Rfl: 6   ibuprofen (ADVIL) 800 MG tablet, Take 1 tablet (800 mg total) by mouth every 8 (eight) hours as needed., Disp: 90 tablet, Rfl: 0   MAGNESIUM GLUCONATE  PO, Take 100 mg by mouth., Disp: , Rfl:    Multiple Vitamin (MULTIVITAMIN) tablet, Take 1 tablet by mouth daily., Disp: , Rfl:    omeprazole (PRILOSEC) 20 MG capsule, TAKE 1 CAPSULE BY MOUTH EVERY DAY, Disp: 90 capsule, Rfl: 0  Allergies  Allergen Reactions   Ezetimibe Other (See Comments)    Fatigue and constipation   Lisinopril Other (See Comments)    Increased fatigue, sleeping more than needed   Statins Other (See Comments)    Muscle aches   Losartan Other (See Comments)    Fatigue, malaise    Objective:   BP 138/80   Pulse 76   Temp 98.2 F (36.8 C) (Temporal)   Ht '5\' 5"'$  (1.651 m)   Wt 172 lb 3.2 oz (78.1 kg)   SpO2 98%   BMI 28.66 kg/m    Physical Exam Vitals reviewed.  Constitutional:      General: She is not in acute distress.    Appearance: Normal appearance. She is normal weight. She is not ill-appearing, toxic-appearing or diaphoretic.  HENT:     Head: Normocephalic.     Right Ear: Tympanic membrane normal.     Left Ear: Tympanic membrane normal.     Nose: Nose normal.     Mouth/Throat:     Mouth: Mucous membranes are dry.  Pharynx: No oropharyngeal exudate or posterior oropharyngeal erythema.  Eyes:     Extraocular Movements: Extraocular movements intact.     Pupils: Pupils are equal, round, and reactive to light.  Cardiovascular:     Rate and Rhythm: Normal rate and regular rhythm.     Pulses: Normal pulses.     Heart sounds: Normal heart sounds.  Pulmonary:     Effort: Pulmonary effort is normal.     Breath sounds: Normal breath sounds.  Abdominal:     General: Abdomen is flat.     Tenderness: There is abdominal tenderness in the right upper quadrant and right lower quadrant. There is guarding. Positive signs include Murphy's sign, McBurney's sign and psoas sign. Negative signs include Rovsing's sign.  Musculoskeletal:     Cervical back: Normal range of motion.  Neurological:     General: No focal deficit present.     Mental Status: She is  alert and oriented to person, place, and time. Mental status is at baseline.  Psychiatric:        Mood and Affect: Mood normal.        Behavior: Behavior normal.        Thought Content: Thought content normal.        Judgment: Judgment normal.      Assessment & Plan:  Right lower quadrant abdominal pain Assessment & Plan: R/o appendicitis and other inflammatory condition in the intestines.  Advised pt if any worsening pain, nausea with vomiting or fever go to er   Stat ct abd pelvis today  Cbc cmp and urine culture  Orders: -     CT ABDOMEN PELVIS W CONTRAST; Future -     Urinalysis, Routine w reflex microscopic  Right upper quadrant abdominal pain Assessment & Plan: R/o cholecystitits , ordering stat ct abd pelvis and labs pending results.    Orders: -     Amylase -     Lipase -     CBC -     Comprehensive metabolic panel  Epigastric pain -     Amylase -     Lipase -     CBC -     Comprehensive metabolic panel  Exposure to the flu Assessment & Plan: Flu testing in office today, negative.       Follow up plan: No follow-ups on file.  Leah Pancoast, FNP

## 2022-10-22 NOTE — Progress Notes (Signed)
[name], can we by an chance still order a urine culture?

## 2022-10-22 NOTE — Assessment & Plan Note (Addendum)
R/o cholecystitits , ordering stat ct abd pelvis and labs pending results.

## 2022-10-22 NOTE — Assessment & Plan Note (Signed)
Flu testing in office today, negative.

## 2022-10-22 NOTE — Progress Notes (Signed)
Going to inquire from GI on how to handle this further. Have reached out awaiting response for possible pancreatic insufficiency.

## 2022-10-22 NOTE — Addendum Note (Signed)
Addended by: Vaughan Browner on: 10/22/2022 09:39 AM   Modules accepted: Orders

## 2022-10-22 NOTE — Assessment & Plan Note (Signed)
R/o appendicitis and other inflammatory condition in the intestines.  Advised pt if any worsening pain, nausea with vomiting or fever go to er   Stat ct abd pelvis today  Cbc cmp and urine culture

## 2022-10-22 NOTE — Addendum Note (Signed)
Addended by: Ellamae Sia on: 10/22/2022 04:27 PM   Modules accepted: Orders

## 2022-10-22 NOTE — Addendum Note (Signed)
Addended by: Vaughan Browner on: 10/22/2022 04:30 PM   Modules accepted: Orders

## 2022-10-23 ENCOUNTER — Telehealth: Payer: Self-pay | Admitting: Gastroenterology

## 2022-10-23 ENCOUNTER — Encounter: Payer: Self-pay | Admitting: Family

## 2022-10-23 LAB — URINE CULTURE
MICRO NUMBER:: 14525710
SPECIMEN QUALITY:: ADEQUATE

## 2022-10-23 NOTE — Telephone Encounter (Signed)
Pt returned call in ref to referral she stated she missed your call this morning. Please return call at [phone]

## 2022-10-23 NOTE — Telephone Encounter (Signed)
Patient just need a appointment for a referral. Please call patient to schedule

## 2022-10-28 NOTE — Progress Notes (Unsigned)
10/28/2022 [name] [id number] [birth year]   Chief Complaint:  History of Present Illness: [name]. Rittel is a 67 year old female with a past medical history of anxiety, migraine headaches, hypertension, breast cancer and a hyperplastic colon polyp. She is known by Dr. Silverio Decamp. She presents to our office today as referred by [name] NP for further evaluation regarding possible pancreatic insufficiency. She underwent a CTAP 10/20/2022 which showed chronic fatty replacement of much of the pancreas.     Latest Ref Rng & Units 10/22/2022    9:33 AM 06/11/2022   12:13 PM 10/01/2021   10:38 AM  CBC  WBC 4.0 - 10.5 K/uL 6.6  5.1  4.7   Hemoglobin 12.0 - 15.0 g/dL 12.9  12.4  12.8   Hematocrit 36.0 - 46.0 % 38.4  37.4  39.6   Platelets 150.0 - 400.0 K/uL 270.0  246  253.0        Latest Ref Rng & Units 10/22/2022    9:33 AM 06/11/2022   12:13 PM 01/11/2022   10:55 AM  CMP  Glucose 70 - 99 mg/dL 100  106  100   BUN 6 - 23 mg/dL 15  14  16   $ Creatinine 0.40 - 1.20 mg/dL 0.78  0.88  0.72   Sodium 135 - 145 mEq/L 142  139  138   Potassium 3.5 - 5.1 mEq/L 3.9  4.0  4.1   Chloride 96 - 112 mEq/L 104  105  101   CO2 19 - 32 mEq/L 29  29  28   $ Calcium 8.4 - 10.5 mg/dL 9.2  8.8  9.0   Total Protein 6.0 - 8.3 g/dL 7.5  7.8  7.5   Total Bilirubin 0.2 - 1.2 mg/dL 0.4  0.5  0.5   Alkaline Phos 39 - 117 U/L 70  74  67   AST 0 - 37 U/L 15  21  22   $ ALT 0 - 35 U/L 12  15  18    $ Lipase 15. Amylase 29.  CTAP 10/20/2022: CLINICAL DATA:  Right lower and right upper quadrant abdominal pain with decreased appetite, nausea and fatigue for 4 days. Prior hysterectomy. History of right breast cancer. * Tracking Code: BO *   EXAM: CT ABDOMEN AND PELVIS WITH CONTRAST   TECHNIQUE: Multidetector CT imaging of the abdomen and pelvis was performed using the standard protocol following bolus administration of intravenous contrast.   RADIATION DOSE REDUCTION: This exam was performed according to  the departmental dose-optimization program which includes automated exposure control, adjustment of the mA and/or kV according to patient size and/or use of iterative reconstruction technique.   CONTRAST:  174m OMNIPAQUE IOHEXOL 300 MG/ML  SOLN   COMPARISON:  10/30/2021 CT abdomen/pelvis.   FINDINGS: Lower chest: No significant pulmonary nodules or acute consolidative airspace disease.   Hepatobiliary: Normal liver size. No liver mass. Normal gallbladder with no radiopaque cholelithiasis. No biliary ductal dilatation.   Pancreas: No pancreatic mass or duct dilation. Prominent fatty replacement of much of the pancreas, unchanged.   Spleen: Normal size. No mass.   Adrenals/Urinary Tract: Normal adrenals. Normal kidneys with no hydronephrosis and no renal mass. Normal bladder.   Stomach/Bowel: Normal non-distended stomach. Normal caliber small bowel with no small bowel wall thickening. Normal appendix. Oral contrast transits to the left colon. Mild sigmoid diverticulosis with no large bowel wall thickening or significant pericolonic fat stranding.   Vascular/Lymphatic: Atherosclerotic nonaneurysmal abdominal aorta. Patent portal, splenic, hepatic  and renal veins. No pathologically enlarged lymph nodes in the abdomen or pelvis.   Reproductive: Status post hysterectomy, with no abnormal findings at the vaginal cuff. No adnexal mass.   Other: No pneumoperitoneum, ascites or focal fluid collection.   Musculoskeletal: No aggressive appearing focal osseous lesions. Stable focal sclerosis in the left symphysis pubis, nonspecific, favoring degenerative etiology. Mild thoracolumbar spondylosis.   IMPRESSION: 1. No acute abnormality. No evidence of bowel obstruction or acute bowel inflammation. Mild sigmoid diverticulosis, with no evidence of acute diverticulitis. Normal appendix. 2. Chronic fatty replacement of much of the pancreas. Consider pancreatic insufficiency. 3.  Aortic  Atherosclerosis  CTAP 10/30/2021: Pancreas: No pancreatic ductal dilation or evidence of acute inflammation. Similar diffuse fatty pancreatic atrophy.    Colonoscopy 03/06/2022:  - 10 year recall colonoscopy   Past Medical History:  Diagnosis Date   Anemia    Anxiety    Blood transfusion without reported diagnosis    Breast cancer ([place]) 02/27/2007   Right   COVID-19 08/30/2020   GERD (gastroesophageal reflux disease)    Hypercholesterolemia    Hypertension    Migraine    Hx of in past   Personal history of radiation therapy 2008   Right   Wears dentures    partial upper and lower   Past Surgical History:  Procedure Laterality Date   ABDOMINAL HYSTERECTOMY     BREAST BIOPSY Right 02/27/2007   BREAST LUMPECTOMY Right 04/02/2007   BREAST SURGERY  01/14/2013   breast reduction left side   BROW LIFT Bilateral 06/17/2017   Procedure: BLEPHAROPLASTY UPPER EYELID WITH EXCESS SKIN;  Surgeon: Karle Starch, MD;  Location: [facility];  Service: Ophthalmology;  Laterality: Bilateral;   BROW LIFT Bilateral 11/30/2021   Procedure: BROW PTOSIS REPAIR BILATERAL;  Surgeon: Karle Starch, MD;  Location: [facility];  Service: Ophthalmology;  Laterality: Bilateral;   CESAREAN SECTION     two times   COLONOSCOPY     FOOT SURGERY Right    PTOSIS REPAIR Bilateral 06/17/2017   Procedure: PTOSIS REPAIR RESECT EX;  Surgeon: Karle Starch, MD;  Location: [facility];  Service: Ophthalmology;  Laterality: Bilateral;   REDUCTION MAMMAPLASTY Left    SHOULDER ARTHROSCOPY WITH ROTATOR CUFF REPAIR AND OPEN BICEPS TENODESIS Right 02/01/2019   Procedure: RIGHT SHOULDER ARTHROSCOPY, DEBRIDEMENT, BICEPS TENODESIS, MINI ROTATOR CUFF TEAR REPAIR;  Surgeon: Meredith Pel, MD;  Location: [facility];  Service: Orthopedics;  Laterality: Right;     Current Medications, Allergies, Past Medical History, Past Surgical History, Family History and Social History were  reviewed in [employer] record.   Review of Systems:   Constitutional: Negative for fever, sweats, chills or weight loss.  Respiratory: Negative for shortness of breath.   Cardiovascular: Negative for chest pain, palpitations and leg swelling.  Gastrointestinal: See HPI.  Musculoskeletal: Negative for back pain or muscle aches.  Neurological: Negative for dizziness, headaches or paresthesias.    Physical Exam: There were no vitals taken for this visit. General: in no acute distress. Head: Normocephalic and atraumatic. Eyes: No scleral icterus. Conjunctiva pink . Ears: Normal auditory acuity. Mouth: Dentition intact. No ulcers or lesions.  Lungs: Clear throughout to auscultation. Heart: Regular rate and rhythm, no murmur. Abdomen: Soft, nontender and nondistended. No masses or hepatomegaly. Normal bowel sounds x 4 quadrants.  Rectal: Deferred.  Musculoskeletal: Symmetrical with no gross deformities. Extremities: No edema. Neurological: Alert oriented x 4. No focal deficits.  Psychological: Alert and  cooperative. Normal mood and affect  Assessment and Recommendations:  1) CTAP 10/20/2022 showed Chronic fatty replacement of much of the pancreas, suggestive pancreatic insufficiency. Normal lipase and amylase levels.  -Pancreatic elastase level   2) History of a hyperplastic colon polyp per colonoscopy 02/2022  3) Personal history of breast cancer

## 2022-10-29 ENCOUNTER — Encounter: Payer: Self-pay | Admitting: Nurse Practitioner

## 2022-10-29 ENCOUNTER — Ambulatory Visit: Payer: PPO | Admitting: Nurse Practitioner

## 2022-10-29 ENCOUNTER — Other Ambulatory Visit: Payer: PPO

## 2022-10-29 VITALS — BP 136/84 | HR 81 | Ht 65.0 in | Wt 173.0 lb

## 2022-10-29 DIAGNOSIS — K8689 Other specified diseases of pancreas: Secondary | ICD-10-CM

## 2022-10-29 DIAGNOSIS — K219 Gastro-esophageal reflux disease without esophagitis: Secondary | ICD-10-CM | POA: Diagnosis not present

## 2022-10-29 DIAGNOSIS — R1013 Epigastric pain: Secondary | ICD-10-CM | POA: Diagnosis not present

## 2022-10-29 DIAGNOSIS — R110 Nausea: Secondary | ICD-10-CM | POA: Diagnosis not present

## 2022-10-29 NOTE — Patient Instructions (Signed)
You have been scheduled for an abdominal ultrasound at [facility] Radiology (1st floor of hospital) on 11/04/22 at 9:30 am. Please arrive 30 minutes prior to your appointment for registration. Make certain not to have anything to eat or drink after midnight. Should you need to reschedule your appointment, please contact radiology at [phone]. This test typically takes about 30 minutes to perform.   Your provider has requested that you go to the basement level for lab work before leaving today. Press "B" on the elevator. The lab is located at the first door on the left as you exit the elevator.  Continue Omeprazole 20 mg 1 by mouth daily to be taken 30 minutes before breakfast.  Contact our office if your symptoms worsen.  Due to recent changes in healthcare laws, you may see the results of your imaging and laboratory studies on MyChart before your provider has had a chance to review them.  We understand that in some cases there may be results that are confusing or concerning to you. Not all laboratory results come back in the same time frame and the provider may be waiting for multiple results in order to interpret others.  Please give [place] 48 hours in order for your provider to thoroughly review all the results before contacting the office for clarification of your results.   Thank you for trusting me with your gastrointestinal care!   Carl Best, CRNP

## 2022-10-31 ENCOUNTER — Ambulatory Visit: Payer: PPO | Attending: Cardiology | Admitting: Internal Medicine

## 2022-10-31 ENCOUNTER — Encounter: Payer: Self-pay | Admitting: Internal Medicine

## 2022-10-31 VITALS — BP 142/86 | HR 79 | Ht 65.0 in | Wt 172.5 lb

## 2022-10-31 DIAGNOSIS — I10 Essential (primary) hypertension: Secondary | ICD-10-CM

## 2022-10-31 DIAGNOSIS — Z789 Other specified health status: Secondary | ICD-10-CM | POA: Diagnosis not present

## 2022-10-31 DIAGNOSIS — E7849 Other hyperlipidemia: Secondary | ICD-10-CM

## 2022-10-31 DIAGNOSIS — R110 Nausea: Secondary | ICD-10-CM | POA: Diagnosis not present

## 2022-10-31 MED ORDER — AMLODIPINE BESYLATE 5 MG PO TABS: 5.0000 mg | ORAL_TABLET | Freq: Every day | ORAL | 3 refills | Status: DC

## 2022-10-31 NOTE — Progress Notes (Signed)
Follow-up Outpatient Visit Date: 10/31/2022  Primary Care Provider: Eugenia [name], [place] [address]  Chief Complaint: Follow-up hyperlipidemia  HPI:  [name] is a 67 y.o. female with history of hypertension, heterozygous familial hyperlipidemia and statin intolerance, breast cancer, GERD, and migraine headaches, who presents for follow-up of hyperlipidemia and shortness of breath.  She was last seen in our office in August by Gerrie Nordmann, NP, at which time she complained of fatigue and exertional dyspnea.  She had been started on Repatha by Dr. Debara Pickett in the lipid clinic and was tolerating this well other than a single isolated episode of leg pain.  She had also been started on lisinopril for blood pressure control by her PCP.  Echo was ordered for evaluation of fatigue and exertional dyspnea; this showed grade 1 diastolic dysfunction but was otherwise unremarkable.  She subsequently saw Dr. Debara Pickett again and was advised to continue with Repatha.  Most recent lipid panel in August showed an excellent LDL of 62.  LP(a) ordered by Dr. Debara Pickett was also undetectable (<10).  Today, [name] reports that she is feeling fairly well.  She had an episode of nausea couple weeks ago and was found to have right upper quadrant tenderness by her PCP.  CT abdomen and pelvis was notable for fatty replacement of the pancreas raising possibility for pancreatic insufficiency.  Right upper quadrant ultrasound is pending for evaluation of possible gallbladder disease.  From a heart standpoint, she has been feeling fairly well without chest pain, shortness of breath, palpitations, lightheadedness, or edema.  Home blood pressures are typically 130-138/70-84.  She remains compliant with Repatha but notes that giving herself an injection every 2 weeks is bothersome.  She does not have any side effects other than short-lived injection site  discomfort.  --------------------------------------------------------------------------------------------------  Cardiovascular History & Procedures: Cardiovascular Problems: Heterozygous familial hyperlipidemia Aortic atherosclerosis   Risk Factors: Hyperlipidemia   Cath/PCI: None   CV Surgery: None   EP Procedures and Devices: None   Non-Invasive Evaluation(s): TTE (05/28/2022): Normal LV size and wall thickness with LVEF 60-65% and normal wall motion.  Grade 1 diastolic dysfunction.  Normal RV size and function.  Mild mitral valve calcification without stenosis or regurgitation.  Otherwise, no significant valvular abnormalities.  Normal CVP. Coronary calcium score (07/27/2018): CAC 0 (low risk).  No incidental extracardiac findings in the chest.  Recent CV Pertinent Labs: Lab Results  Component Value Date   CHOL 151 05/14/2022   HDL 50.90 05/14/2022   LDLCALC 62 05/14/2022   LDLDIRECT 209.0 06/04/2019   TRIG 190.0 (H) 05/14/2022   CHOLHDL 3 05/14/2022   K 3.9 10/22/2022   K 3.9 10/18/2009   MG 2.1 05/14/2022   BUN 15 10/22/2022   BUN 12 10/18/2009   CREATININE 0.78 10/22/2022   CREATININE 0.7 10/18/2009    Past medical and surgical history were reviewed and updated in EPIC.  Current Meds  Medication Sig   amLODipine (NORVASC) 2.5 MG tablet TAKE 1 TABLET BY MOUTH EVERY DAY   azelastine (ASTELIN) 0.1 % nasal spray SPRAY 1-2 SPRAYS IN EACH NOSTRIL TWICE DAILY.   buPROPion (WELLBUTRIN XL) 150 MG 24 hr tablet Take 1 tablet (150 mg total) by mouth daily.   escitalopram (LEXAPRO) 20 MG tablet TAKE 1 TABLET BY MOUTH EVERY DAY   Evolocumab (REPATHA SURECLICK) XX123456 MG/ML SOAJ INJECT 1 ML INTO THE SKIN EVERY 14 (FOURTEEN) DAYS.   fluticasone (FLONASE) 50 MCG/ACT nasal spray Place 2  sprays into both nostrils daily.   ibuprofen (ADVIL) 800 MG tablet Take 1 tablet (800 mg total) by mouth every 8 (eight) hours as needed.   MAGNESIUM GLUCONATE PO Take 100 mg by mouth.    Multiple Vitamin (MULTIVITAMIN) tablet Take 1 tablet by mouth daily.   omeprazole (PRILOSEC) 20 MG capsule TAKE 1 CAPSULE BY MOUTH EVERY DAY    Allergies: Ezetimibe, Lisinopril, Statins, and Losartan  Social History   Tobacco Use   Smoking status: Never   Smokeless tobacco: Never  Vaping Use   Vaping Use: Never used  Substance Use Topics   Alcohol use: Not Currently   Drug use: No    Family History  Problem Relation Age of Onset   Thyroid disease Mother    Hypertension Mother    Hyperlipidemia Mother    Alzheimer's disease Mother    Heart failure Mother    Esophageal cancer Father    Hyperlipidemia Father    Heart murmur Father    Colon polyps Father    COPD Father    Aneurysm Father    Throat cancer Father    Diabetes Sister    Breast cancer Sister    Irritable bowel syndrome Sister    Breast cancer Sister    Ulcerative colitis Sister    Rheum arthritis Sister    Alzheimer's disease Maternal Aunt    Alzheimer's disease Maternal Uncle    Diabetes Paternal Uncle    Alzheimer's disease Maternal Grandmother    Diabetes Paternal Grandmother    Breast cancer Cousin    Breast cancer Cousin    Colon cancer Neg Hx    Stomach cancer Neg Hx    Rectal cancer Neg Hx     Review of Systems: A 12-system review of systems was performed and was negative except as noted in the HPI.  --------------------------------------------------------------------------------------------------  Physical Exam: BP (!) 140/74 (BP Location: Left Arm, Patient Position: Sitting, Cuff Size: Normal)   Pulse 79   Ht 5' 5"$  (1.651 m)   Wt 172 lb 8 oz (78.2 kg)   SpO2 97%   BMI 28.71 kg/m  Repeat BP 142/86  General:  NAD. Neck: No JVD or HJR. Lungs: Clear to auscultation bilaterally without wheezes or crackles. Heart: Regular rate and rhythm without murmurs, rubs, or gallops. Abdomen: Soft, nontender, nondistended. Extremities: No lower extremity edema.  EKG: Normal sinus rhythm with  nonspecific ST/T changes.  No significant change from prior tracing on 01/11/2022.  Lab Results  Component Value Date   WBC 6.6 10/22/2022   HGB 12.9 10/22/2022   HCT 38.4 10/22/2022   MCV 84.8 10/22/2022   PLT 270.0 10/22/2022    Lab Results  Component Value Date   NA 142 10/22/2022   K 3.9 10/22/2022   CL 104 10/22/2022   CO2 29 10/22/2022   BUN 15 10/22/2022   CREATININE 0.78 10/22/2022   GLUCOSE 100 (H) 10/22/2022   ALT 12 10/22/2022    Lab Results  Component Value Date   CHOL 151 05/14/2022   HDL 50.90 05/14/2022   LDLCALC 62 05/14/2022   LDLDIRECT 209.0 06/04/2019   TRIG 190.0 (H) 05/14/2022   CHOLHDL 3 05/14/2022    --------------------------------------------------------------------------------------------------  ASSESSMENT AND PLAN: Heterozygous familial hyperlipidemia and statin intolerance: [name] is tolerating Repatha fairly well, though she reports transient injection site discomfort and does not like having to get injections every 2 weeks.  Her LDL has responded very well to this.  She inquires about other potential treatment options,  and we discussed inclisiran as an alternative therapy.  I will reach out to Dr. Debara Pickett and our pharmacy team to see if inclisiran would be a reasonable therapeutic option and not cost prohibitive for [name].  In the meantime, she could should continue with her current dose of Repatha.  Hypertension: Blood pressure mildly elevated today, typically a little better at home.  We have agreed to increase amlodipine to 5 mg daily.  Nausea: Ongoing workup per [name], with abdominal ultrasound pending.  Follow-up: Return to clinic in 3 months.  Nelva Bush, MD 10/31/2022 10:43 AM

## 2022-10-31 NOTE — Patient Instructions (Signed)
Medication Instructions:  Your physician recommends the following medication changes.  INCREASE: Amlodipine to 5 mg by mouth daily  *If you need a refill on your cardiac medications before your next appointment, please call your pharmacy*   Lab Work: None ordered today   Testing/Procedures: None ordered today   Follow-Up: At [facility], you and your health needs are our priority.  As part of our continuing mission to provide you with exceptional heart care, we have created designated Provider Care Teams.  These Care Teams include your primary Cardiologist (physician) and Advanced Practice Providers (APPs -  Physician Assistants and Nurse Practitioners) who all work together to provide you with the care you need, when you need it.  We recommend signing up for the patient portal called "MyChart".  Sign up information is provided on this After Visit Summary.  MyChart is used to connect with patients for Virtual Visits (Telemedicine).  Patients are able to view lab/test results, encounter notes, upcoming appointments, etc.  Non-urgent messages can be sent to your provider as well.   To learn more about what you can do with MyChart, go to [email or web address].    Your next appointment:   3 month(s)  Provider:   You will see one of the following Advanced Practice Providers on your designated Care Team:   Murray Hodgkins, NP Christell Faith, PA-C Cadence Kathlen Mody, PA-C Gerrie Nordmann, NP

## 2022-11-01 ENCOUNTER — Encounter: Payer: Self-pay | Admitting: Internal Medicine

## 2022-11-01 DIAGNOSIS — R110 Nausea: Secondary | ICD-10-CM | POA: Insufficient documentation

## 2022-11-04 ENCOUNTER — Ambulatory Visit
Admission: RE | Admit: 2022-11-04 | Discharge: 2022-11-04 | Disposition: A | Discharge status: Home / Self Care | Payer: PPO | Source: Ambulatory Visit | Attending: Nurse Practitioner | Admitting: Nurse Practitioner

## 2022-11-04 DIAGNOSIS — K219 Gastro-esophageal reflux disease without esophagitis: Secondary | ICD-10-CM | POA: Insufficient documentation

## 2022-11-04 DIAGNOSIS — R1013 Epigastric pain: Secondary | ICD-10-CM | POA: Diagnosis not present

## 2022-11-04 DIAGNOSIS — R110 Nausea: Secondary | ICD-10-CM | POA: Diagnosis not present

## 2022-11-04 DIAGNOSIS — R109 Unspecified abdominal pain: Secondary | ICD-10-CM | POA: Diagnosis not present

## 2022-11-04 DIAGNOSIS — K8689 Other specified diseases of pancreas: Secondary | ICD-10-CM | POA: Diagnosis not present

## 2022-11-06 ENCOUNTER — Telehealth: Payer: Self-pay

## 2022-11-06 ENCOUNTER — Other Ambulatory Visit (HOSPITAL_COMMUNITY): Payer: Self-pay

## 2022-11-06 ENCOUNTER — Telehealth: Payer: Self-pay | Admitting: Internal Medicine

## 2022-11-06 NOTE — Telephone Encounter (Signed)
Pharmacy Patient Advocate Encounter   Received notification from [place] that prior authorization for REPATHA 420MG/3.5ML is needed.    PA submitted on 11/06/22 Key [id number] Status is pending  Karie Soda, CPhT Pharmacy Patient Advocate Specialist Direct Number: [phone] Fax: [phone]

## 2022-11-06 NOTE — Telephone Encounter (Signed)
Prior auth or the Repatha Pushtronix submitted

## 2022-11-06 NOTE — Telephone Encounter (Signed)
Lipid treatment options were reviewed with Dr. Debara Pickett and our pharmacy team.  Marion Downer would be cost-prohibitive.  [name] is interested in trying the Repatha Pushtronix system; I have asked Dr. Debara Pickett and our pharmacy team to help transition Ms. Altadonna from her current Repatha regimen, if possible.  Nelva Bush, MD [facility]

## 2022-11-07 ENCOUNTER — Other Ambulatory Visit: Payer: PPO

## 2022-11-07 DIAGNOSIS — K8689 Other specified diseases of pancreas: Secondary | ICD-10-CM

## 2022-11-07 DIAGNOSIS — R1013 Epigastric pain: Secondary | ICD-10-CM

## 2022-11-07 DIAGNOSIS — R110 Nausea: Secondary | ICD-10-CM

## 2022-11-07 DIAGNOSIS — K219 Gastro-esophageal reflux disease without esophagitis: Secondary | ICD-10-CM

## 2022-11-08 ENCOUNTER — Other Ambulatory Visit (HOSPITAL_COMMUNITY): Payer: Self-pay

## 2022-11-08 NOTE — Telephone Encounter (Signed)
CMM request got hung up and won't send to plan. Manually faxed request to [employer] at [phone] on 11/08/22

## 2022-11-11 MED ORDER — REPATHA PUSHTRONEX SYSTEM 420 MG/3.5ML ~~LOC~~ SOCT: 3.6000 mL | SUBCUTANEOUS | 11 refills | Status: DC

## 2022-11-11 NOTE — Addendum Note (Signed)
Addended by: Marcelle Overlie D on: 11/11/2022 01:39 PM   Modules accepted: Orders

## 2022-11-11 NOTE — Telephone Encounter (Signed)
Pharmacy Patient Advocate Encounter  Prior Authorization for REPATHA '420MG'$ /3.5ML  has been approved.    Effective dates: 11/08/22 through 11/09/23  Karie Soda, [place] Patient Advocate Specialist Direct Number: [phone] Fax: [phone]

## 2022-11-11 NOTE — Telephone Encounter (Signed)
Called pt to let her know that Repatha on pump infusion was approved. Patient has not used this device before so I wanted to make sure she didn't have any questions about it. LVM for pt to call back.

## 2022-11-14 ENCOUNTER — Other Ambulatory Visit: Payer: Self-pay

## 2022-11-14 ENCOUNTER — Telehealth: Payer: Self-pay | Admitting: Pharmacy Technician

## 2022-11-14 DIAGNOSIS — R110 Nausea: Secondary | ICD-10-CM

## 2022-11-14 LAB — PANCREATIC ELASTASE, FECAL: Pancreatic Elastase-1, Stool: 224 mcg/g

## 2022-11-14 MED ORDER — ONDANSETRON 4 MG PO TBDP: 4.0000 mg | ORAL_TABLET | Freq: Four times a day (QID) | ORAL | 0 refills | Status: DC | PRN

## 2022-11-14 NOTE — Telephone Encounter (Signed)
[name], pls contact patient and send in RX for Ondansetron '4mg'$  ODT one tab  dissolve on tongue Q 6 to 8 hrs PRN nausea. #30, no refills. If her nausea worsens, to consider [facility] HIDA scan to rule out gallbladder dyskinesia and possible EGD. I will contact patient once I receive her pancreatic elastase stool result, still in process. THX

## 2022-11-14 NOTE — Telephone Encounter (Signed)
I spoke with patient. Made her aware that PA was approved and Rx sent to pharmacy. Briefly reviewed that device. Pt appreciative of the call

## 2022-11-14 NOTE — Telephone Encounter (Signed)
Pt was made aware of Carl Best NP recommendations: Prescription sent to pharmacy. Pt made aware. Pt notified that Carl Best NP will contact patient once she receives her pancreatic elastase stool result, still in process. Pt verbalized understanding with all questions answered.

## 2022-11-14 NOTE — Telephone Encounter (Signed)
Patient Advocate Encounter  Received notification from [place] that prior authorization for ONDANSETRON is required.   PA submitted on 2.29.24 Key [id number] Status is pending

## 2022-11-25 NOTE — Telephone Encounter (Signed)
PA denial- Full letter in [place]

## 2022-11-26 DIAGNOSIS — D3131 Benign neoplasm of right choroid: Secondary | ICD-10-CM | POA: Diagnosis not present

## 2022-11-28 ENCOUNTER — Other Ambulatory Visit: Payer: Self-pay | Admitting: Family

## 2022-11-28 ENCOUNTER — Encounter: Payer: Self-pay | Admitting: Family

## 2022-11-28 DIAGNOSIS — K2960 Other gastritis without bleeding: Secondary | ICD-10-CM

## 2022-11-28 DIAGNOSIS — J309 Allergic rhinitis, unspecified: Secondary | ICD-10-CM

## 2022-11-29 ENCOUNTER — Telehealth: Payer: Self-pay | Admitting: Nurse Practitioner

## 2022-11-29 MED ORDER — OMEPRAZOLE 20 MG PO CPDR: DELAYED_RELEASE_CAPSULE | ORAL | 0 refills | Status: DC

## 2022-11-29 MED ORDER — FLUTICASONE PROPIONATE 50 MCG/ACT NA SUSP: 2.0000 | Freq: Every day | NASAL | 6 refills | Status: DC

## 2022-11-29 NOTE — Telephone Encounter (Signed)
Pt questioning recent lab done ( pancreatis elastase, fecal ) . Questions if she needs to be doing something or have any follow up.  Please advise

## 2022-11-29 NOTE — Telephone Encounter (Signed)
Patient is calling to check on lab results. Please advise

## 2022-12-03 ENCOUNTER — Other Ambulatory Visit: Payer: Self-pay | Admitting: Family

## 2022-12-03 DIAGNOSIS — Z760 Encounter for issue of repeat prescription: Secondary | ICD-10-CM

## 2022-12-03 DIAGNOSIS — Z79899 Other long term (current) drug therapy: Secondary | ICD-10-CM

## 2022-12-03 NOTE — Telephone Encounter (Signed)
Dr. Silverio Decamp, Dr. Silverio Decamp, refer to office visit 10/29/2022, pancreatic elastase level low normal. Let me know if you recommend pancreatic enzyme and do you want her to undergo an abdominal MRI to make sure nothing was missed on CT?   Remo Lipps I am off work Wed 12/04/2022, pls provide patient with Dr. Woodward Ku recommendations [place]

## 2022-12-06 ENCOUNTER — Telehealth: Payer: Self-pay

## 2022-12-06 NOTE — Progress Notes (Signed)
Care Management & Coordination Services Pharmacy Team  Reason for Encounter: Appointment Reminder  Contacted patient to confirm in office appointment  [name] on 12/11/22 at 10:00.  Spoke with patient on 12/06/2022 - reminded patient [facility] location.  Do you have any problems getting your medications? No  What is your top health concern you would like to discuss at your upcoming visit?  Counseled patient on reason for visit with PharmD  Have you seen any other providers since your last visit with PCP? No   Chart review:  Recent office visits:  10/22/22-Tabitha Dugal,FNP(PCP)-nausea,UA,referral for CT scan 10/07/22-Tabitha Dugal,FNP(PCP)- wellness exam,discussed screenings,vaccines,no medication changes, f/u 6 months  06/18/22-Tabitha Dugal,FNP(PCP)- f/u HTN,Stop lisninopril-Start amlodipine 2.5 mg once daily-Prescription given for augmentin 875/125 mg po bid for ten days. Start Wellbutrin 150 mg once daily.f/u 1 month       Recent consult visits:  11/26/22-Arpita Maniar,MD(ophthal)-f/u - no data 10/31/22-Christopher End,MD(cardio)-f/u HLD,- discussed inclisiran as an alternative therapy-increase amlodipine to 5 mg daily. F/u 3 months 10/29/22-Colleen Kennedy-Smith.NP(gastro) f/u test results -Consider future pancreatic MRI,no medication changes  10/16/22-Keirnan Willett,MD(ophthal)- no data 06/11/22-Govinda Brahmanday,MD(onc)-consult elevated B12 levels.Labs,  Hospital visits:  08/30/22-[facility]-Covid -19-molnupiravir 800mg  - no admission    Star Rating Drugs:  Medication:  Last Fill: Day Supply No star medications identified  Care Gaps: Annual wellness visit in last year? Yes  Charlene Brooke, PharmD notified  Avel Sensor, [place] Assistant [phone]

## 2022-12-10 NOTE — Telephone Encounter (Signed)
[name], please schedule office follow-up visit with me soon, okay to double book so I can evaluate the patient before I give further recommendation.  It is very difficult to make a recommendation based on the phone notes.  I do not want to put patient through multiple testing if not necessary.  Workup so far is reassuring.

## 2022-12-11 ENCOUNTER — Other Ambulatory Visit: Payer: Self-pay | Admitting: Family

## 2022-12-11 ENCOUNTER — Ambulatory Visit: Payer: PPO | Admitting: Pharmacist

## 2022-12-11 ENCOUNTER — Other Ambulatory Visit: Payer: Self-pay | Admitting: Pharmacist

## 2022-12-11 ENCOUNTER — Other Ambulatory Visit: Payer: Self-pay

## 2022-12-11 DIAGNOSIS — R748 Abnormal levels of other serum enzymes: Secondary | ICD-10-CM

## 2022-12-11 DIAGNOSIS — Z780 Asymptomatic menopausal state: Secondary | ICD-10-CM

## 2022-12-11 DIAGNOSIS — E782 Mixed hyperlipidemia: Secondary | ICD-10-CM

## 2022-12-11 DIAGNOSIS — I10 Essential (primary) hypertension: Secondary | ICD-10-CM

## 2022-12-11 DIAGNOSIS — R110 Nausea: Secondary | ICD-10-CM

## 2022-12-11 DIAGNOSIS — E7801 Familial hypercholesterolemia: Secondary | ICD-10-CM

## 2022-12-11 DIAGNOSIS — F419 Anxiety disorder, unspecified: Secondary | ICD-10-CM

## 2022-12-11 DIAGNOSIS — K8689 Other specified diseases of pancreas: Secondary | ICD-10-CM

## 2022-12-11 DIAGNOSIS — Z0001 Encounter for general adult medical examination with abnormal findings: Secondary | ICD-10-CM

## 2022-12-11 DIAGNOSIS — N6489 Other specified disorders of breast: Secondary | ICD-10-CM

## 2022-12-11 DIAGNOSIS — E663 Overweight: Secondary | ICD-10-CM

## 2022-12-11 DIAGNOSIS — N941 Unspecified dyspareunia: Secondary | ICD-10-CM

## 2022-12-11 DIAGNOSIS — R1013 Epigastric pain: Secondary | ICD-10-CM

## 2022-12-11 MED ORDER — PANCRELIPASE (LIP-PROT-AMYL) 36000-114000 UNITS PO CPEP: 72000.0000 [IU] | ORAL_CAPSULE | Freq: Three times a day (TID) | ORAL | 2 refills | Status: DC

## 2022-12-11 NOTE — Telephone Encounter (Signed)
Please see notes below and assist with the double Booking for Dr. Silverio Decamp. I assume that you may know the best time to double book her schedule. Thanks

## 2022-12-11 NOTE — Telephone Encounter (Signed)
I Spoke with patient and made her an appointment for May 10 at 9:30 am, that is in a nurse spot Dr Silverio Decamp you had nothing else until August for the office.  Is this date ok?

## 2022-12-11 NOTE — Telephone Encounter (Signed)
Pt requests refill for bupropion XL 150 mg to [facility].  Routing to provider.

## 2022-12-11 NOTE — Patient Instructions (Signed)
Visit Information  Phone number for Pharmacist: [phone]  Thank you for meeting with me to discuss your medications! I look forward to working with you to achieve your health care goals. Below is a summary of what we talked about during the visit:  We will refill the Bupropion XL 150 - please do not cut XL tablets in half.  We will call you about switching the Lexapro to something else.  Remember to schedule your DEXA (bone) scan and Mammogram for April: [place], PharmD, BCACP Clinical Pharmacist [facility] [phone]

## 2022-12-11 NOTE — Progress Notes (Signed)
Care Management & Coordination Services Pharmacy Note  12/11/2022 Name:  [name] MRN:  [id number] DOB:  May 12, 1956  Summary: Initial OV -Anxiety: pt has been on Lexapro for about 8 years and she is concerned efficacy is waning; indeed it is common for SSRI efficacy to decline after 5-10 years; she has tried multiple other medications (citalopram, fluoxetine, venlafaxine) and is interested in switching therapy now -Chronic PPI use: pt is concerned about long term effects of omeprazole, discussed bone density concerns; pt's last DEXA was 2010 and new scan has been ordered by PCP  Recommendations/Changes made from today's visit: -Recommend to stop escitalopram: taper to 1/2 dose x 1 week then stop. Then start Duloxetine 30 mg daily x 1 month. Can increase to 60 mg after 4-6 weeks if additional benefit needed - consulting with PCP, see phone note -Advised pt to schedule DEXA scan and mammogram in April  Follow up plan: -Pharmacist follow up televisit scheduled for 1 month -Cardiology appt 01/30/23    Subjective: [name] is an 67 y.o. year old female who is a primary patient of [name], [place].  The care coordination team was consulted for assistance with disease management and care coordination needs.    Engaged with patient face to face for initial visit. Patient lives at home with her husband and granddaughter. Her granddaughter is on the autism spectrum and pt has helped care for her while patient's daughter "works on herself".   Recent office visits: 10/22/22-Tabitha Dugal,FNP OV: RLQ abd pain, nausea. D/C estrogen cream (not taking). Ordered CT abdomen to r/o cholecystitis.   10/07/22-Tabitha Dugal,FNP OV: annual - ordered DEXA, mammogram. D/C fish oil (not taking).  06/18/22-Tabitha Dugal,FNP OV: chronic fatigue, sinusitis. Rx Augmentin, fluconazole. D/C lisinopril and start amlodipine 2.5 mg.  Recent consult visits: 12/10/22 GI stool test results: pancreatic elastase  stool test low, start Creon 36K.  11/26/22-Arpita Maniar,MD [facility] ophthalmology): choroidal nevus  10/31/22 Dr End (Cardiology): Familial HLD,- discussed Leqvio as an alternative therapy; BP 142/86 - increase amlodipine to 5 mg daily. F/u 3 months  10/29/22 Colleen Kennedy-Smith,NP (GI): fatty pancreas, abd pain. CT suggests pancreatic insufficiency. Consider pancreatic MRI. Rec RUQ sono and EGD.  10/16/22-Keirnan Willett,MD(ophthal)- no data 06/11/22-Govinda Brahmanday,MD(onc)-consult elevated B12 levels.Labs,  Hospital visits: None in previous 6 months   Objective:  Lab Results  Component Value Date   CREATININE 0.78 10/22/2022   BUN 15 10/22/2022   GFR 79.30 10/22/2022   GFRNONAA >60 06/11/2022   GFRAA >60 02/01/2019   NA 142 10/22/2022   K 3.9 10/22/2022   CALCIUM 9.2 10/22/2022   CO2 29 10/22/2022   GLUCOSE 100 (H) 10/22/2022    Lab Results  Component Value Date/Time   HGBA1C 6.4 09/27/2020 10:10 AM   HGBA1C 6.4 03/08/2014 08:42 AM   GFR 79.30 10/22/2022 09:33 AM   GFR 91.29 10/01/2021 10:38 AM   MICROALBUR <0.7 10/07/2022 12:58 PM    Last diabetic Eye exam: No results found for: "HMDIABEYEEXA"  Last diabetic Foot exam: No results found for: "HMDIABFOOTEX"   Lab Results  Component Value Date   CHOL 151 05/14/2022   HDL 50.90 05/14/2022   LDLCALC 62 05/14/2022   LDLDIRECT 209.0 06/04/2019   TRIG 190.0 (H) 05/14/2022   CHOLHDL 3 05/14/2022       Latest Ref Rng & Units 10/22/2022    9:33 AM 06/11/2022   12:13 PM 01/11/2022   10:55 AM  Hepatic Function  Total Protein 6.0 - 8.3 g/dL 7.5  7.8  7.5   Albumin 3.5 - 5.2 g/dL 4.4  4.0  4.0   AST 0 - 37 U/L 15  21  22    ALT 0 - 35 U/L 12  15  18    Alk Phosphatase 39 - 117 U/L 70  74  67   Total Bilirubin 0.2 - 1.2 mg/dL 0.4  0.5  0.5     Lab Results  Component Value Date/Time   TSH 1.71 03/28/2022 01:24 PM   TSH 2.14 10/01/2021 10:38 AM   FREET4 0.84 02/16/2015 11:02 AM   FREET4 0.67 03/08/2014 08:42 AM        Latest Ref Rng & Units 10/22/2022    9:33 AM 06/11/2022   12:13 PM 10/01/2021   10:38 AM  CBC  WBC 4.0 - 10.5 K/uL 6.6  5.1  4.7   Hemoglobin 12.0 - 15.0 g/dL 12.9  12.4  12.8   Hematocrit 36.0 - 46.0 % 38.4  37.4  39.6   Platelets 150.0 - 400.0 K/uL 270.0  246  253.0     Lab Results  Component Value Date/Time   VD25OH 28.50 (L) 02/16/2015 11:02 AM   VD25OH 30.28 03/08/2014 08:42 AM   VITAMINB12 3,386 (H) 06/11/2022 12:13 PM   VITAMINB12 >1500 (H) 05/14/2022 11:32 AM    Clinical ASCVD: Yes  The 10-year ASCVD risk score (Arnett DK, et al., 2019) is: 9.1%   Values used to calculate the score:     Age: 67 years     Sex: Female     Is Non-Hispanic African American: No     Diabetic: No     Tobacco smoker: No     Systolic Blood Pressure: [id number] mmHg     Is BP treated: Yes     HDL Cholesterol: 50.9 mg/dL     Total Cholesterol: 151 mg/dL       10/22/2022    2:35 PM 10/07/2022   12:33 PM 10/04/2022    9:35 AM  Depression screen PHQ 2/9  Decreased Interest 0 0 0  Down, Depressed, Hopeless 0 0 0  PHQ - 2 Score 0 0 0  Altered sleeping 0 0   Tired, decreased energy 0 0   Change in appetite 0 0   Feeling bad or failure about yourself  0 0   Trouble concentrating 0 0   Moving slowly or fidgety/restless 0 0   Suicidal thoughts 0 0   PHQ-9 Score 0 0   Difficult doing work/chores Not difficult at all Not difficult at all        10/22/2022    2:35 PM 10/07/2022   12:33 PM 01/25/2020    4:03 PM 12/15/2018    8:12 AM  GAD 7 : Generalized Anxiety Score  Nervous, Anxious, on Edge 0 0 0 0  Control/stop worrying 0 0 1 0  Worry too much - different things 0 0 1 0  Trouble relaxing 0 0 2 0  Restless 0  0 1  Easily annoyed or irritable 0 0 2 0  Afraid - awful might happen 0 0 0 0  Total GAD 7 Score 0  6 1  Anxiety Difficulty Not difficult at all Not difficult at all Somewhat difficult Not difficult at all     Social History   Tobacco Use  Smoking Status Never  Smokeless Tobacco Never    BP Readings from Last 3 Encounters:  10/31/22 (!) 142/86  10/29/22 136/84  10/22/22 138/80   Pulse Readings from Last 3 Encounters:  10/31/22 79  10/29/22 81  10/22/22 76   Wt Readings from Last 3 Encounters:  10/31/22 172 lb 8 oz (78.2 kg)  10/29/22 173 lb (78.5 kg)  10/22/22 172 lb 3.2 oz (78.1 kg)   BMI Readings from Last 3 Encounters:  10/31/22 28.71 kg/m  10/29/22 28.79 kg/m  10/22/22 28.66 kg/m    Allergies  Allergen Reactions   Ezetimibe Other (See Comments)    Fatigue and constipation   Lisinopril Other (See Comments)    Increased fatigue, sleeping more than needed   Statins Other (See Comments)    Muscle aches   Losartan Other (See Comments)    Fatigue, malaise    Medications Reviewed Today     Reviewed by Charlton Haws, [facility] (Pharmacist) on 12/11/22 at 1643  Med List Status: <None>   Medication Order Taking? Sig Documenting Provider Last Dose Status Informant  amLODipine (NORVASC) 5 MG tablet [id number] Yes Take 1 tablet (5 mg total) by mouth daily. End, Harrell Gave, MD Taking Active   azelastine (ASTELIN) 0.1 % nasal spray [id number] Yes SPRAY 1-2 SPRAYS IN EACH NOSTRIL TWICE DAILY. [provider] Taking Active   buPROPion (WELLBUTRIN XL) 150 MG 24 hr tablet [id number] Yes Take 1 tablet (150 mg total) by mouth daily. [name], FNP Taking Active   escitalopram (LEXAPRO) 20 MG tablet [id number] Yes TAKE 1 TABLET BY MOUTH EVERY DAY Dugal, Tabitha, FNP Taking Active   Evolocumab with Infusor ([address]) 420 MG/3.5ML SOCT [id number] Yes Inject 3.6 mLs into the skin every 30 (thirty) days. End, Harrell Gave, MD Taking Active   fluticasone [facility]) 50 MCG/ACT nasal spray [id number] Yes Place 2 sprays into both nostrils daily. [name], FNP Taking Active   glucosamine-chondroitin 500-400 MG tablet [id number] Yes Take 1 tablet by mouth. [provider] Taking Active   ibuprofen (ADVIL) 800 MG tablet [id number] Yes Take  1 tablet (800 mg total) by mouth every 8 (eight) hours as needed. Waunita Schooner, MD Taking Active   lipase/protease/amylase (CREON) 36000 UNITS CPEP capsule [id number]  Take 2 capsules (72,000 Units total) by mouth 3 (three) times daily with meals. Mauri Pole, MD  Active   Multiple Vitamin (MULTIVITAMIN) tablet [id number] Yes Take 1 tablet by mouth daily. [provider] Taking Active Self  omeprazole (PRILOSEC) 20 MG capsule [id number] Yes TAKE 1 CAPSULE BY MOUTH EVERY DAY Dugal, Tabitha, FNP Taking Active             SDOH:  (Social Determinants of Health) assessments and interventions performed: No - done Jan2024 SDOH Interventions    Flowsheet Row Clinical Support from 10/04/2022 in [place] at [place] Visit from 01/25/2020 in [place] at [place] Interventions Intervention Not Indicated --  Housing Interventions Intervention Not Indicated --  Transportation Interventions Intervention Not Indicated --  Depression Interventions/Treatment  -- [id number] Score <4 Follow-up Not Indicated, Currently on Treatment  Financial Strain Interventions Intervention Not Indicated --  Physical Activity Interventions Intervention Not Indicated --  Stress Interventions Intervention Not Indicated --  Social Connections Interventions Intervention Not Indicated --       Medication Assistance: None required.  Patient affirms current coverage meets needs.  Medication Access: Within the past 30 days, how often has patient missed a dose of medication? 0 Is a pillbox or other method used to improve adherence? Yes  Factors that may affect medication adherence? no barriers identified Are meds synced by  current pharmacy? No  Are meds delivered by current pharmacy? No  Does patient experience delays in picking up medications due to transportation concerns? No   [facility] Services Reviewed: Is  patient disadvantaged to use [facility]?: Yes  Current Rx insurance plan: HTA Name and location of Current pharmacy[facility]  [id number] [place], [place] [place] [address] Phone: [phone] Fax: [phone]  [facility] [place] [place] [facility] [place] [address] [place] [address] Phone: [phone] Fax: [phone]  [facility] services reviewed with patient today?: No  Patient requests to transfer care to [facility]?: No  Reason patient declined to change pharmacies: Disadvantaged due to insurance/mail order  Compliance/Adherence/Medication fill history: Care Gaps: None  Star-Rating Drugs: None   Assessment/Plan   Hypertension (BP goal <130/80) -Controlled - pt is not checking BP at home often but reports it is typically in 120s-130s/60-70s -Current treatment: Amlodipine 5 mg daily - Appropriate, Effective, Safe, Accessible -Medications previously tried: lisinopril, hctz  -Current home BP readings: n/a -Denies hypotensive/hypertensive symptoms -Educated on BP goals and benefits of medications for prevention of heart attack, stroke and kidney damage; Symptoms of hypotension and importance of maintaining adequate hydration; -Counseled to monitor BP at home 1-2x weekly -Recommended to continue current medication  Familial Hyperlipidemia: (LDL goal < 70) -Controlled - LDL 62 (04/2022), improved from 213 with PCSK9-I; pt recently switched to Pushtronex device; she reports Repatha is $10 per month -Follows with cardiology; per team Leqvio was cost-prohibitive with HTA -Current treatment: Repatha Pushtronex 420 mg q30 days - Appropriate, Effective, Safe, Accessible -Medications previously tried: pravastatin, rosuvastatin, simvastatin  -Educated on Cholesterol goals;  -Recommended to continue current medication  Depression/Anxiety (Goal: manage symptoms) -Not ideally controlled - pt reports she has  been on Lexapro for many years and she is concerned it is not working as well; she has a lot of stress related to caring for special needs granddaughter and is interested in optimizing her regimen -of note, pt has been using her daughter's supply of bupropion XL 300 mg and cutting these in 1/2; discussed it is not advisable to cut XL products in half as it will compromise the XL properties of the tablet -PHQ9: 0 (10/2022) -GAD7: 0 (10/2022) -Connected with PCP for mental health support -Current treatment: Bupropion XL 150 mg daily - Appropriate, Effective, Safe, Accessible Escitalopram 20 mg daily (2016- ) - Appropriate, Query Effective -Medications previously tried/failed: citalopram, fluoxetine, venlafaxine (all ineffective after a period) -Reviewed duration of effect for SSRI therapy - it is possible that effect is waning as studies have shown reduction in SSRI effect after 5-10 years; she has tried multiple agents previously for varying amounts of time; would consider switching to SNRI (Duloxetine has not been tried before) or something like Trintellix (though cost may be a concern); could also consider pharmacogenomic testing to optimize therapy -Recommend to stop escitalopram: taper to 1/2 dose x 1 week then stop. Then start Duloxetine 30 mg daily x 1 month. Can increase to 60 mg after 4-6 weeks if additional benefit needed  ? Pancreatic insufficiency  -Hx of nausea, abd pain. Ruled out cholecystitis, appendicitis. Concern for pancreatic insufficiency now. -Follows with GI (Dr Silverio Decamp); starting trial of Creon x 2 weeks to assess for improvement -Current treatment  None -Medications previously tried: n/a  -Advised to follow up with GI  GERD (Goal: minimize symptoms of reflux ) -Controlled - pt was told to use omeprazole daily  for a month to see if it helped GI symptoms, she is not sure it has -Current treatment  Omeprazole 20 mg PRN - Appropriate, Effective, Safe,  Accessible -Medications previously tried: none reported  -Reviewed long term side effects of PPI including reduced bone density/fracture risk; pt has not had a DEXA since 2010, advised she schedule DEXA appt for April -Recommended to continue current medication  Prediabetes (Goal: A1c < 6.5%) -Controlled - A1c 6.4% (09/2020)  Health Maintenance -Vaccine gaps: Shingrix -Due for DEXA and mammogram: call  [phone] -Allergies: flonase, astelin spray -Headache/pain:  pt uses ibuprofen 800 mg very rarely -Hx Breast cancer (2008), tamoxifen x 3 years. Remote hx hysterectomy -OTC: multivitamin, Cinnamon supplement, Turmeric supplement   Charlene Brooke, PharmD, [place], CPP Clinical Education administrator [facility] [phone]

## 2022-12-11 NOTE — Telephone Encounter (Signed)
Ok, thank you

## 2022-12-12 ENCOUNTER — Telehealth: Payer: Self-pay | Admitting: Pharmacist

## 2022-12-12 DIAGNOSIS — F419 Anxiety disorder, unspecified: Secondary | ICD-10-CM

## 2022-12-12 MED ORDER — BUPROPION HCL ER (XL) 150 MG PO TB24: 150.0000 mg | ORAL_TABLET | Freq: Every day | ORAL | 1 refills | Status: DC

## 2022-12-12 NOTE — Telephone Encounter (Signed)
I do agree with this plan and thank you so much for seeing this patient with your recommendations.   However, can you clarify, we would also want to taper off of the wellbutrin as well prior to starting SNRI correct

## 2022-12-12 NOTE — Telephone Encounter (Signed)
LV- 10/22/22 LR- 06/18/22 ( 90 tab/ no refill) NV- Not Scheduled

## 2022-12-12 NOTE — Telephone Encounter (Signed)
Discussed anxiety with patient. She has been on Lexapro for about 8 years and she is concerned efficacy is waning; indeed it is common for SSRI efficacy to decline after 5-10 years; she has tried multiple other medications (citalopram, fluoxetine, venlafaxine) and is interested in switching therapy now. Discussed options; given multiple failed SSRIs it may be better to switch to an SNRI or alternative agent, like Trintellix. Pt would rather pursue generic options before trying Trintellix.  Recommendations -Recommend to stop escitalopram: taper to 1/2 dose x 1 week then stop. Then start Duloxetine 30 mg daily x 1 month. Can increase to 60 mg after 4-6 weeks if additional benefit is needed -PharmD will follow up 4 weeks after change to re-assess.  Routing to PCP for input. Pt is open to office visit to discuss.

## 2022-12-13 NOTE — Telephone Encounter (Signed)
Sure! That is a common concern given bupropion does have some weak norepinephrine activity, however in practice bupropion is used as adjunct therapy with both SSRI or SNRI, and is generally well tolerated in combination with either, so it should be safe to continue. If you would like to be more cautious we can start the duloxetine at the lower 20 mg dose.  In my discussion with patient, she did mention she thought the bupropion was helpful and was interested in staying on it if possible.

## 2022-12-16 NOTE — Telephone Encounter (Signed)
Ok, then let's proceed as you first recommended. For some reason I always though bupropion was not desired with SNRI. Thanks!

## 2022-12-17 ENCOUNTER — Encounter: Payer: Self-pay | Admitting: Family

## 2022-12-17 MED ORDER — DULOXETINE HCL 30 MG PO CPEP: 30.0000 mg | ORAL_CAPSULE | Freq: Every day | ORAL | 0 refills | Status: DC

## 2022-12-17 NOTE — Telephone Encounter (Signed)
eRx sent for Duloxetine.  Plan: -Taper escitalopram: take 1/2 dose x 1 week then stop. -Then start Duloxetine 30 mg daily x 1 month.  -F/u call with PharmD 1 month

## 2022-12-17 NOTE — Progress Notes (Signed)
Spoke with patient to confirm she understands medication taper instructions. Patient advised to contact us for any other questions or concerns after starting Duloxetine.  Charlene Brooke, PharmD notified  Avel Sensor, [place] Assistant [phone]

## 2022-12-17 NOTE — Telephone Encounter (Signed)
Please advise Taper dosing, or does this needs to wait until her provider is back in office?

## 2023-01-10 ENCOUNTER — Ambulatory Visit: Payer: PPO | Admitting: Pharmacist

## 2023-01-10 DIAGNOSIS — D3131 Benign neoplasm of right choroid: Secondary | ICD-10-CM | POA: Diagnosis not present

## 2023-01-10 DIAGNOSIS — H2513 Age-related nuclear cataract, bilateral: Secondary | ICD-10-CM | POA: Diagnosis not present

## 2023-01-10 NOTE — Progress Notes (Signed)
Care Management & Coordination Services Pharmacy Note  01/10/2023 Name:  [name] MRN:  [id number] DOB:  10-31-1955  Summary: F/U visit -Anxiety: pt reports improvement in anxiety since switching Lexapro to duloxetine ~2 weeks ago; she also has resumed bupropion 150 mg XL (previously was cutting her daughter's 300 mg XL tablets in half which would inactivate the XL properties of the drug); she reports mild appetite suppression and has lost 5 lbs, she is trying to lose weight so this is desirable effect for her -HTN: pt reports BP has been lower; today 117/76, she has no symptoms of hypotension; discussed wt loss, improved anxiety may contribute to improved BP; she is monitoring BP daily  Recommendations/Changes made from today's visit: -No med changes -Discussed to continue daily monitoring of BP; contact provider if BP < 100/60  Follow up plan: -Health Concierge will call patient June 2024 for BP update -Pharmacist follow up televisit scheduled for 6 months -GI appt 01/24/23; Cardiology appt 01/30/23    Subjective: [name] is an 67 y.o. year old female who is a primary patient of Mort Sawyers, FNP.  The care coordination team was consulted for assistance with disease management and care coordination needs.    Engaged with patient face to face for initial visit. Patient lives at home with her husband and granddaughter. Her granddaughter is on the autism spectrum and pt has helped care for her while patient's daughter "works on herself".   Recent office visits: 10/22/22-Tabitha Dugal,FNP OV: RLQ abd pain, nausea. D/C estrogen cream (not taking). Ordered CT abdomen to r/o cholecystitis.   10/07/22-Tabitha Dugal,FNP OV: annual - ordered DEXA, mammogram. D/C fish oil (not taking).  06/18/22-Tabitha Dugal,FNP OV: chronic fatigue, sinusitis. Rx Augmentin, fluconazole. D/C lisinopril and start amlodipine 2.5 mg.  Recent consult visits: 12/10/22 GI stool test results: pancreatic  elastase stool test low, start Creon 36K.  11/26/22-Arpita Maniar,MD [facility]): choroidal nevus  10/31/22 Dr End (Cardiology): Familial HLD,- discussed Leqvio as an alternative therapy; BP 142/86 - increase amlodipine to 5 mg daily. F/u 3 months  10/29/22 Colleen Kennedy-Smith,NP (GI): fatty pancreas, abd pain. CT suggests pancreatic insufficiency. Consider pancreatic MRI. Rec RUQ sono and EGD.  10/16/22-Keirnan Willett,MD(ophthal)- no data 06/11/22-Govinda Brahmanday,MD(onc)-consult elevated B12 levels.Labs,  Hospital visits: None in previous 6 months   Objective:  Lab Results  Component Value Date   CREATININE 0.78 10/22/2022   BUN 15 10/22/2022   GFR 79.30 10/22/2022   GFRNONAA >60 06/11/2022   GFRAA >60 02/01/2019   NA 142 10/22/2022   K 3.9 10/22/2022   CALCIUM 9.2 10/22/2022   CO2 29 10/22/2022   GLUCOSE 100 (H) 10/22/2022    Lab Results  Component Value Date/Time   HGBA1C 6.4 09/27/2020 10:10 AM   HGBA1C 6.4 03/08/2014 08:42 AM   GFR 79.30 10/22/2022 09:33 AM   GFR 91.29 10/01/2021 10:38 AM   MICROALBUR <0.7 10/07/2022 12:58 PM    Last diabetic Eye exam: No results found for: "HMDIABEYEEXA"  Last diabetic Foot exam: No results found for: "HMDIABFOOTEX"   Lab Results  Component Value Date   CHOL 151 05/14/2022   HDL 50.90 05/14/2022   LDLCALC 62 05/14/2022   LDLDIRECT 209.0 06/04/2019   TRIG 190.0 (H) 05/14/2022   CHOLHDL 3 05/14/2022       Latest Ref Rng & Units 10/22/2022    9:33 AM 06/11/2022   12:13 PM 01/11/2022   10:55 AM  Hepatic Function  Total Protein 6.0 - 8.3 g/dL 7.5  7.8  7.5   Albumin 3.5 - 5.2 g/dL 4.4  4.0  4.0   AST 0 - 37 U/L 15  21  22    ALT 0 - 35 U/L 12  15  18    Alk Phosphatase 39 - 117 U/L 70  74  67   Total Bilirubin 0.2 - 1.2 mg/dL 0.4  0.5  0.5     Lab Results  Component Value Date/Time   TSH 1.71 03/28/2022 01:24 PM   TSH 2.14 10/01/2021 10:38 AM   FREET4 0.84 02/16/2015 11:02 AM   FREET4 0.67 03/08/2014 08:42 AM        Latest Ref Rng & Units 10/22/2022    9:33 AM 06/11/2022   12:13 PM 10/01/2021   10:38 AM  CBC  WBC 4.0 - 10.5 K/uL 6.6  5.1  4.7   Hemoglobin 12.0 - 15.0 g/dL 16.1  09.6  04.5   Hematocrit 36.0 - 46.0 % 38.4  37.4  39.6   Platelets 150.0 - 400.0 K/uL 270.0  246  253.0     Lab Results  Component Value Date/Time   VD25OH 28.50 (L) 02/16/2015 11:02 AM   VD25OH 30.28 03/08/2014 08:42 AM   VITAMINB12 3,386 (H) 06/11/2022 12:13 PM   VITAMINB12 >1500 (H) 05/14/2022 11:32 AM    Clinical ASCVD: Yes  The 10-year ASCVD risk score (Arnett DK, et al., 2019) is: 6.3%   Values used to calculate the score:     Age: 24 years     Sex: Female     Is Non-Hispanic African American: No     Diabetic: No     Tobacco smoker: No     Systolic Blood Pressure: 117 mmHg     Is BP treated: Yes     HDL Cholesterol: 50.9 mg/dL     Total Cholesterol: 151 mg/dL       4/0/9811    9:14 PM 10/07/2022   12:33 PM 10/04/2022    9:35 AM  Depression screen PHQ 2/9  Decreased Interest 0 0 0  Down, Depressed, Hopeless 0 0 0  PHQ - 2 Score 0 0 0  Altered sleeping 0 0   Tired, decreased energy 0 0   Change in appetite 0 0   Feeling bad or failure about yourself  0 0   Trouble concentrating 0 0   Moving slowly or fidgety/restless 0 0   Suicidal thoughts 0 0   PHQ-9 Score 0 0   Difficult doing work/chores Not difficult at all Not difficult at all        10/22/2022    2:35 PM 10/07/2022   12:33 PM 01/25/2020    4:03 PM 12/15/2018    8:12 AM  GAD 7 : Generalized Anxiety Score  Nervous, Anxious, on Edge 0 0 0 0  Control/stop worrying 0 0 1 0  Worry too much - different things 0 0 1 0  Trouble relaxing 0 0 2 0  Restless 0  0 1  Easily annoyed or irritable 0 0 2 0  Afraid - awful might happen 0 0 0 0  Total GAD 7 Score 0  6 1  Anxiety Difficulty Not difficult at all Not difficult at all Somewhat difficult Not difficult at all     Social History   Tobacco Use  Smoking Status Never  Smokeless  Tobacco Never   BP Readings from Last 3 Encounters:  01/10/23 117/76  10/31/22 (!) 142/86  10/29/22 136/84   Pulse Readings from Last 3 Encounters:  10/31/22 79  10/29/22 81  10/22/22 76   Wt Readings from Last 3 Encounters:  10/31/22 172 lb 8 oz (78.2 kg)  10/29/22 173 lb (78.5 kg)  10/22/22 172 lb 3.2 oz (78.1 kg)   BMI Readings from Last 3 Encounters:  10/31/22 28.71 kg/m  10/29/22 28.79 kg/m  10/22/22 28.66 kg/m    Allergies  Allergen Reactions   Ezetimibe Other (See Comments)    Fatigue and constipation   Lisinopril Other (See Comments)    Increased fatigue, sleeping more than needed   Statins Other (See Comments)    Muscle aches   Losartan Other (See Comments)    Fatigue, malaise    Medications Reviewed Today     Reviewed by Kathyrn Sheriff, [facility] (Pharmacist) on 01/10/23 at 1337  Med List Status: <None>   Medication Order Taking? Sig Documenting Provider Last Dose Status Informant  amLODipine (NORVASC) 5 MG tablet [id number] Yes Take 1 tablet (5 mg total) by mouth daily. End, Cristal Deer, MD Taking Active   azelastine (ASTELIN) 0.1 % nasal spray [id number] Yes SPRAY 1-2 SPRAYS IN EACH NOSTRIL TWICE DAILY. [provider] Taking Active   buPROPion (WELLBUTRIN XL) 150 MG 24 hr tablet [id number] Yes Take 1 tablet (150 mg total) by mouth daily. Mort Sawyers, FNP Taking Active   DULoxetine (CYMBALTA) 30 MG capsule [id number] Yes Take 1 capsule (30 mg total) by mouth daily. Start after escitalopram taper is complete Mort Sawyers, FNP Taking Active   Evolocumab with Infusor (REPATHA PUSHTRONEX SYSTEM) 420 MG/3.5ML SOCT [id number] Yes Inject 3.6 mLs into the skin every 30 (thirty) days. End, Cristal Deer, MD Taking Active   fluticasone [facility]) 50 MCG/ACT nasal spray [id number] Yes Place 2 sprays into both nostrils daily. Mort Sawyers, FNP Taking Active   glucosamine-chondroitin 500-400 MG tablet [id number] Yes Take 1 tablet by mouth. [provider] Taking Active   ibuprofen (ADVIL) 800 MG tablet [id number] Yes Take 1 tablet (800 mg total) by mouth every 8 (eight) hours as needed. Gweneth Dimitri, MD Taking Active   lipase/protease/amylase (CREON) 36000 UNITS CPEP capsule [id number] Yes Take 2 capsules (72,000 Units total) by mouth 3 (three) times daily with meals. Napoleon Form, MD Taking Active   Multiple Vitamin (MULTIVITAMIN) tablet [id number] Yes Take 1 tablet by mouth daily. [provider] Taking Active Self  omeprazole (PRILOSEC) 20 MG capsule [id number] Yes TAKE 1 CAPSULE BY MOUTH EVERY DAY Dugal, Tabitha, FNP Taking Active             SDOH:  (Social Determinants of Health) assessments and interventions performed: No - done Jan2024 SDOH Interventions    Flowsheet Row Clinical Support from 10/04/2022 in [facility] Office Visit from 01/25/2020 in [facility] [place]  SDOH Interventions    Food Insecurity Interventions Intervention Not Indicated --  Housing Interventions Intervention Not Indicated --  Transportation Interventions Intervention Not Indicated --  Depression Interventions/Treatment  -- ATF5-7 Score <4 Follow-up Not Indicated, Currently on Treatment  Financial Strain Interventions Intervention Not Indicated --  Physical Activity Interventions Intervention Not Indicated --  Stress Interventions Intervention Not Indicated --  Social Connections Interventions Intervention Not Indicated --       Medication Assistance: None required.  Patient affirms current coverage meets needs.  Medication Access: Within the past 30 days, how often has patient missed a dose of medication? 0 Is a pillbox or other method used to improve adherence? Yes  Factors that may affect medication  adherence? no barriers identified Are meds synced by current pharmacy? No  Are meds delivered by current pharmacy? No  Does patient experience delays in picking up  medications due to transportation concerns? No   Upstream Services Reviewed: Is patient disadvantaged to use [facility]?: Yes  Current Rx insurance plan: HTA Name and location of Current pharmacy:  [id number] [place], [place] - [address] [place] [address] Phone: [phone] Fax: [phone]  [id number] [place] [place] [facility] [address] [place] [address] Phone: [phone] Fax: [phone]  [facility] services reviewed with patient today?: No  Patient requests to transfer care to [facility] Pharm[facility]?: No  Reason patient declined to change pharmacies: Disadvantaged due to insurance/mail order  Compliance/Adherence/Medication fill history: Care Gaps: None  Star-Rating Drugs: None   Assessment/Plan   Hypertension (BP goal <130/80) -Controlled - pt reports BP has been lower lately, she has lost 5 lbs and anxiety is improved -Current home BP readings: 117/76 -Current treatment: Amlodipine 5 mg daily - Appropriate, Effective, Safe, Accessible -Medications previously tried: lisinopril, hctz  -Denies hypotensive/hypertensive symptoms -Educated on BP goals and benefits of medications for prevention of heart attack, stroke and kidney damage; Symptoms of hypotension and importance of maintaining adequate hydration; -Counseled to monitor BP at home daily; discussed contacting provider if BP < 100/60 -Recommended to continue current medication  Familial Hyperlipidemia: (LDL goal < 70) -Controlled - LDL 62 (04/2022), improved from 213 with PCSK9-I; pt recently switched to Pushtronex device; she reports Repatha is $10 per month -Follows with cardiology; per team Leqvio was cost-prohibitive with HTA -Current treatment: Repatha Pushtronex 420 mg q30 days - Appropriate, Effective, Safe, Accessible -Medications previously tried: pravastatin, rosuvastatin, simvastatin  -Educated on Cholesterol goals;  -Recommended to  continue current medication  Depression/Anxiety (Goal: manage symptoms) -Improved - pt reports improvement in symptoms since switching Lexapro to Duloxetine, she has now been on duloxetine about 2 weeks and has noticed a difference; she reports she has noticed mild appetite suppression and has lost about 5 lbs, but she is trying to lose weight so this is desirable for her -PHQ9: 0 (10/2022) -GAD7: 0 (10/2022) -Connected with PCP for mental health support -Current treatment: Bupropion XL 150 mg daily - Appropriate, Effective, Safe, Accessible Duloxetine 30 mg daily - Appropriate, Effective, Safe, Accessible -Medications previously tried/failed: citalopram, fluoxetine, venlafaxine (all ineffective after a period) -Recommend to continue current medication  Pancreatic insufficiency  -Hx of nausea, abd pain. Ruled out cholecystitis, appendicitis. Concern for pancreatic insufficiency now. -Follows with GI (Dr Lavon Paganini); starting trial of Creon x 2 weeks to assess for improvement. Pt reports improved symptoms -Current treatment  Creon 36,000 units - 2 cap TID w/ meals - Appropriate, Effective, Safe, Accessible -Medications previously tried: n/a  -Recommend to continue current medication  GERD (Goal: minimize symptoms of reflux ) -Controlled - pt was told to use omeprazole daily for a month to see if it helped GI symptoms, she is not sure it has -Current treatment  Omeprazole 20 mg PRN - Appropriate, Effective, Safe, Accessible -Medications previously tried: none reported  -Reviewed long term side effects of PPI including reduced bone density/fracture risk; pt has not had a DEXA since 2010, pt has now scheduled DEXA for Oct 2024 -Recommended to continue current medication  Prediabetes (Goal: A1c < 6.5%) -Controlled - A1c 6.4% (09/2020)  Health Maintenance -Vaccine gaps: Shingrix -Allergies: flonase, astelin spray -Headache/pain:  pt uses ibuprofen 800 mg very rarely -  Hx Breast cancer (2008),  tamoxifen x 3 years. Remote hx hysterectomy -OTC: multivitamin, Cinnamon supplement, Turmeric supplement   Al Corpus, PharmD, [place], CPP Clinical Sports administrator Healthcare at [facility] [phone]

## 2023-01-11 ENCOUNTER — Other Ambulatory Visit: Payer: Self-pay | Admitting: Family

## 2023-01-20 NOTE — Telephone Encounter (Signed)
Pt called in requesting a call back to discuss medication #[id number]

## 2023-01-22 ENCOUNTER — Ambulatory Visit
Admission: RE | Admit: 2023-01-22 | Discharge: 2023-01-22 | Disposition: A | Discharge status: Home / Self Care | Payer: PPO | Source: Ambulatory Visit | Attending: Family | Admitting: Family

## 2023-01-22 DIAGNOSIS — Z1231 Encounter for screening mammogram for malignant neoplasm of breast: Secondary | ICD-10-CM | POA: Diagnosis not present

## 2023-01-22 DIAGNOSIS — N6489 Other specified disorders of breast: Secondary | ICD-10-CM

## 2023-01-24 ENCOUNTER — Ambulatory Visit: Payer: PPO | Admitting: Gastroenterology

## 2023-01-24 ENCOUNTER — Encounter: Payer: Self-pay | Admitting: Gastroenterology

## 2023-01-24 ENCOUNTER — Other Ambulatory Visit: Payer: PPO

## 2023-01-24 VITALS — BP 120/80 | HR 80 | Ht 65.0 in | Wt 169.0 lb

## 2023-01-24 DIAGNOSIS — K8689 Other specified diseases of pancreas: Secondary | ICD-10-CM

## 2023-01-24 DIAGNOSIS — K8020 Calculus of gallbladder without cholecystitis without obstruction: Secondary | ICD-10-CM

## 2023-01-24 DIAGNOSIS — K861 Other chronic pancreatitis: Secondary | ICD-10-CM

## 2023-01-24 DIAGNOSIS — R1013 Epigastric pain: Secondary | ICD-10-CM | POA: Diagnosis not present

## 2023-01-24 DIAGNOSIS — R110 Nausea: Secondary | ICD-10-CM | POA: Diagnosis not present

## 2023-01-24 MED ORDER — PANCRELIPASE (LIP-PROT-AMYL) 36000-114000 UNITS PO CPEP: 72000.0000 [IU] | ORAL_CAPSULE | Freq: Three times a day (TID) | ORAL | 11 refills | Status: DC

## 2023-01-24 NOTE — Progress Notes (Signed)
[name]    [id number]    1956-08-12  Primary Care Physician:Dugal, Wyatt Mage, FNP  Referring Physician: Mort Sawyers, FNP [id number] [place],  [place] [address]   Chief complaint:  Abnormal CT of pancreas  HPI:  67 year old very pleasant female here for follow-up visit for abnormal CT of pancreas and pancreatic insufficiency  She feels overall her symptoms have improved with daily Creon, she is taking 1 to 2 capsules 3 times daily with meals She continues to have excessive abdominal bloating and flatulence Denies diarrhea when she takes Creon, no blood in stool or melena.  Right upper quadrant abdominal ultrasound 02/02/2023 Probable sludge in the gallbladder. No evidence of acute cholecystitis.  CT abdomen pelvis with contrast 10/22/2022 1. No acute abnormality. No evidence of bowel obstruction or acute bowel inflammation. Mild sigmoid diverticulosis, with no evidence of acute diverticulitis. Normal appendix. 2. Chronic fatty replacement of much of the pancreas. Consider pancreatic insufficiency. 3.  Aortic Atherosclerosis   Fecal elastase 2/22 2024: 224, normal Normal amylase and lipase October 22, 2022  Colonoscopy March 06, 2022 - One 1 mm polyp in the transverse colon, removed with a cold biopsy forceps. Resected and retrieved. - Diverticulosis in the sigmoid colon. - Non-bleeding external and internal hemorrhoids.   Outpatient Encounter Medications as of 01/24/2023  Medication Sig   amLODipine (NORVASC) 5 MG tablet Take 1 tablet (5 mg total) by mouth daily.   azelastine (ASTELIN) 0.1 % nasal spray SPRAY 1-2 SPRAYS IN EACH NOSTRIL TWICE DAILY.   buPROPion (WELLBUTRIN XL) 150 MG 24 hr tablet Take 1 tablet (150 mg total) by mouth daily.   DULoxetine (CYMBALTA) 30 MG capsule TAKE 1 CAPSULE (30 MG TOTAL) BY MOUTH DAILY. START AFTER ESCITALOPRAM TAPER IS COMPLETE (Patient taking differently: Take 60 mg by mouth daily. Start after escitalopram  taper is complete)   Evolocumab with Infusor (REPATHA PUSHTRONEX SYSTEM) 420 MG/3.5ML SOCT Inject 3.6 mLs into the skin every 30 (thirty) days.   fluticasone (FLONASE) 50 MCG/ACT nasal spray Place 2 sprays into both nostrils daily.   glucosamine-chondroitin 500-400 MG tablet Take 1 tablet by mouth.   ibuprofen (ADVIL) 800 MG tablet Take 1 tablet (800 mg total) by mouth every 8 (eight) hours as needed.   lipase/protease/amylase (CREON) 36000 UNITS CPEP capsule Take 2 capsules (72,000 Units total) by mouth 3 (three) times daily with meals.   Multiple Vitamin (MULTIVITAMIN) tablet Take 1 tablet by mouth daily.   omeprazole (PRILOSEC) 20 MG capsule TAKE 1 CAPSULE BY MOUTH EVERY DAY   No facility-administered encounter medications on file as of 01/24/2023.    Allergies as of 01/24/2023 - Review Complete 01/24/2023  Allergen Reaction Noted   Ezetimibe Other (See Comments) 01/11/2022   Lisinopril Other (See Comments) 06/18/2022   Statins Other (See Comments) 05/15/2015   Losartan Other (See Comments) 01/11/2022    Past Medical History:  Diagnosis Date   Anemia    Anxiety    Blood transfusion without reported diagnosis    Breast cancer (HCC) 02/27/2007   Right   COVID-19 08/30/2020   GERD (gastroesophageal reflux disease)    Hypercholesterolemia    Hypertension    Migraine    Hx of in past   Personal history of radiation therapy 2008   Right   Wears dentures    partial upper and lower    Past Surgical History:  Procedure Laterality Date   ABDOMINAL HYSTERECTOMY  BREAST BIOPSY Right 02/27/2007   BREAST LUMPECTOMY Right 04/02/2007   BREAST SURGERY  01/14/2013   breast reduction left side   BROW LIFT Bilateral 06/17/2017   Procedure: BLEPHAROPLASTY UPPER EYELID WITH EXCESS SKIN;  Surgeon: Imagene Riches, MD;  Location: [facility];  Service: Ophthalmology;  Laterality: Bilateral;   BROW LIFT Bilateral 11/30/2021   Procedure: BROW PTOSIS REPAIR BILATERAL;  Surgeon: Imagene Riches, MD;  Location[facility];  Service: Ophthalmology;  Laterality: Bilateral;   CESAREAN SECTION     two times   COLONOSCOPY     FOOT SURGERY Right    PTOSIS REPAIR Bilateral 06/17/2017   Procedure: PTOSIS REPAIR RESECT EX;  Surgeon: Imagene Riches, MD;  Location: [facility];  Service: Ophthalmology;  Laterality: Bilateral;   REDUCTION MAMMAPLASTY Left    SHOULDER ARTHROSCOPY WITH ROTATOR CUFF REPAIR AND OPEN BICEPS TENODESIS Right 02/01/2019   Procedure: RIGHT SHOULDER ARTHROSCOPY, DEBRIDEMENT, BICEPS TENODESIS, MINI ROTATOR CUFF TEAR REPAIR;  Surgeon: Cammy Copa, MD;  Location: [facility];  Service: Orthopedics;  Laterality: Right;    Family History  Problem Relation Age of Onset   Thyroid disease Mother    Hypertension Mother    Hyperlipidemia Mother    Alzheimer's disease Mother    Heart failure Mother    Esophageal cancer Father    Hyperlipidemia Father    Heart murmur Father    Colon polyps Father    COPD Father    Aneurysm Father    Throat cancer Father    Diabetes Sister    Breast cancer Sister    Irritable bowel syndrome Sister    Breast cancer Sister    Ulcerative colitis Sister    Rheum arthritis Sister    Alzheimer's disease Maternal Aunt    Alzheimer's disease Maternal Uncle    Diabetes Paternal Uncle    Alzheimer's disease Maternal Grandmother    Diabetes Paternal Grandmother    Breast cancer Cousin    Breast cancer Cousin    Colon cancer Neg Hx    Stomach cancer Neg Hx    Rectal cancer Neg Hx     Social History   Socioeconomic History   Marital status: Married    Spouse name: Onalee Hua   Number of children: 2   Years of education: high school   Highest education level: Not on file  Occupational History    Employer: retired  Tobacco Use   Smoking status: Never   Smokeless tobacco: Never  Vaping Use   Vaping Use: Never used  Substance and Sexual Activity   Alcohol use: Not Currently   Drug use: No    Sexual activity: Yes    Partners: Male    Birth control/protection: Surgical  Other Topics Concern   Not on file  Social History Narrative   01/25/20   From: the area   Living: with husband, Onalee Hua (1980) and her mother   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      Family: 2 daughters - Asher Muir and Annabelle Harman - 3 grandchildren      Enjoys: boating, walking, beach, fishing, outdoor activity      Exercise: keeps busy moving around the house, irregular   Diet: chicken, some read meat, veggies, salads - trying to limit sugar      Safety   Seat belts: Yes    Guns: Yes  and secure   Safe in relationships: Yes  Social Determinants of Health   Financial Resource Strain: Low Risk  (10/04/2022)   Overall Financial Resource Strain (CARDIA)    Difficulty of Paying Living Expenses: Not hard at all  Food Insecurity: No Food Insecurity (10/04/2022)   Hunger Vital Sign    Worried About Running Out of Food in the Last Year: Never true    Ran Out of Food in the Last Year: Never true  Transportation Needs: No Transportation Needs (10/04/2022)   PRAPARE - Administrator, Civil Service (Medical): No    Lack of Transportation (Non-Medical): No  Physical Activity: Sufficiently Active (10/04/2022)   Exercise Vital Sign    Days of Exercise per Week: 5 days    Minutes of Exercise per Session: 60 min  Stress: No Stress Concern Present (10/04/2022)   [employer] of Occupational Health - Occupational Stress Questionnaire    Feeling of Stress : Not at all  Social Connections: Socially Integrated (10/04/2022)   Social Connection and Isolation Panel [NHANES]    Frequency of Communication with Friends and Family: More than three times a week    Frequency of Social Gatherings with Friends and Family: More than three times a week    Attends Religious Services: More than 4 times per year    Active Member of [employer] or Organizations: Yes    Attends Tax inspector Meetings: 1 to 4 times per year    Marital Status: Married  Catering manager Violence: Not At Risk (10/04/2022)   Humiliation, Afraid, Rape, and Kick questionnaire    Fear of Current or Ex-Partner: No    Emotionally Abused: No    Physically Abused: No    Sexually Abused: No      Review of systems: All other review of systems negative except as mentioned in the HPI.   Physical Exam: Vitals:   01/24/23 0930  BP: 120/80  Pulse: 80   Body mass index is 28.12 kg/m. Gen:      No acute distress HEENT:  sclera anicteric Abd:      soft, non-tender; no palpable masses, no distension Ext:    No edema Neuro: alert and oriented x 3 Psych: normal mood and affect  Data Reviewed:  Reviewed labs, radiology imaging, old records and pertinent past GI work up   Assessment and Plan/Recommendations:  67 year old very pleasant female with pancreatic insufficiency secondary to chronic fatty replacement of pancreas unclear etiology Continue Creon 1 to 2 capsules 3 times daily with meals Check ANA, IgG total and IgG4 level to exclude autoimmune chronic pancreatitis  Will plan for MRCP to better delineate pancreas and exclude any pancreatic neoplastic lesions She has history of cholelithiasis but no cholecystitis, continue to monitor  Discussed in detail all the workup she had so far including labs and imaging  Return in 1 year or sooner if needed  This visit required >40 minutes of patient care (this includes precharting, chart review, review of results, face-to-face time used for counseling as well as treatment plan and follow-up. The patient was provided an opportunity to ask questions and all were answered. The patient agreed with the plan and demonstrated an understanding of the instructions.  Iona Beard , MD    CC: Mort Sawyers, FNP

## 2023-01-24 NOTE — Patient Instructions (Signed)
We have sent the following medications to your pharmacy for you to pick up at your convenience:   Your provider has requested that you go to the basement level for lab work before leaving today. Press "B" on the elevator. The lab is located at the first door on the left as you exit the elevator.   You will have a recall MRI/MRCP in 10/2003, we will send you a reminder letter  Follow up in March 2025 after your MRI  Due to recent changes in healthcare laws, you may see the results of your imaging and laboratory studies on MyChart before your provider has had a chance to review them.  We understand that in some cases there may be results that are confusing or concerning to you. Not all laboratory results come back in the same time frame and the provider may be waiting for multiple results in order to interpret others.  Please give [place] 48 hours in order for your provider to thoroughly review all the results before contacting the office for clarification of your results.    I appreciate the  opportunity to care for you  Thank You   Marsa Aris , MD

## 2023-01-25 LAB — ANA: Anti Nuclear Antibody (ANA): NEGATIVE

## 2023-01-25 LAB — IGG: IgG (Immunoglobin G), Serum: 1126 mg/dL (ref 600–1540)

## 2023-01-29 LAB — IGG 4: IgG, Subclass 4: 58 mg/dL (ref 2–96)

## 2023-01-30 ENCOUNTER — Encounter: Payer: Self-pay | Admitting: Internal Medicine

## 2023-01-30 ENCOUNTER — Ambulatory Visit: Payer: PPO | Attending: Internal Medicine | Admitting: Internal Medicine

## 2023-01-30 VITALS — BP 130/84 | HR 88 | Ht 65.0 in | Wt 169.2 lb

## 2023-01-30 DIAGNOSIS — I10 Essential (primary) hypertension: Secondary | ICD-10-CM | POA: Diagnosis not present

## 2023-01-30 DIAGNOSIS — E7801 Familial hypercholesterolemia: Secondary | ICD-10-CM

## 2023-01-30 NOTE — Progress Notes (Signed)
Follow-up Outpatient Visit Date: 01/30/2023  Primary Care Provider: Mort Sawyers, FNP [address] [place] [address]  Chief Complaint: Follow-up hypertension  HPI:  [name] is a 67 y.o. female with history of hypertension, heterozygous familial hyperlipidemia and statin intolerance, breast cancer, GERD, and migraine headaches, who presents for follow-up of hypertension and hyperlipidemia.  I last saw her in February, which time she was feeling well other than some preceding nausea and right upper quadrant tenderness.  She was undergoing workup for hepatobiliary and pancreatic issues.  She was started on Creon with improvement in her symptoms.  Blood pressure was noted to be mildly elevated, prompting [place] to escalate amlodipine.  She was also switched to once monthly dosing of Repatha.  Today, [name] reports that she has been feeling well.  She is tolerating Repatha and increased dose of amlodipine well.  She denies chest pain, shortness of breath, palpitations, lightheadedness, and edema.  Home blood pressures have been well-controlled. --------------------------------------------------------------------------------------------------  Cardiovascular History & Procedures: Cardiovascular Problems: Heterozygous familial hyperlipidemia Aortic atherosclerosis   Risk Factors: Hyperlipidemia   Cath/PCI: None   CV Surgery: None   EP Procedures and Devices: None   Non-Invasive Evaluation(s): TTE (05/28/2022): Normal LV size and wall thickness with LVEF 60-65% and normal wall motion.  Grade 1 diastolic dysfunction.  Normal RV size and function.  Mild mitral valve calcification without stenosis or regurgitation.  Otherwise, no significant valvular abnormalities.  Normal CVP. Coronary calcium score (07/27/2018): CAC 0 (low risk).  No incidental extracardiac findings in the chest.  Recent CV Pertinent Labs: Lab Results  Component Value Date   CHOL 151 05/14/2022    HDL 50.90 05/14/2022   LDLCALC 62 05/14/2022   LDLDIRECT 209.0 06/04/2019   TRIG 190.0 (H) 05/14/2022   CHOLHDL 3 05/14/2022   K 3.9 10/22/2022   K 3.9 10/18/2009   MG 2.1 05/14/2022   BUN 15 10/22/2022   BUN 12 10/18/2009   CREATININE 0.78 10/22/2022   CREATININE 0.7 10/18/2009    Past medical and surgical history were reviewed and updated in EPIC.  Current Meds  Medication Sig   amLODipine (NORVASC) 5 MG tablet Take 1 tablet (5 mg total) by mouth daily.   azelastine (ASTELIN) 0.1 % nasal spray SPRAY 1-2 SPRAYS IN EACH NOSTRIL TWICE DAILY.   buPROPion (WELLBUTRIN XL) 150 MG 24 hr tablet Take 1 tablet (150 mg total) by mouth daily.   DULoxetine (CYMBALTA) 30 MG capsule TAKE 1 CAPSULE (30 MG TOTAL) BY MOUTH DAILY. START AFTER ESCITALOPRAM TAPER IS COMPLETE (Patient taking differently: Take 60 mg by mouth daily. Start after escitalopram taper is complete)   Evolocumab with Infusor (REPATHA PUSHTRONEX SYSTEM) 420 MG/3.5ML SOCT Inject 3.6 mLs into the skin every 30 (thirty) days.   fluticasone (FLONASE) 50 MCG/ACT nasal spray Place 2 sprays into both nostrils daily.   glucosamine-chondroitin 500-400 MG tablet Take 1 tablet by mouth.   ibuprofen (ADVIL) 800 MG tablet Take 1 tablet (800 mg total) by mouth every 8 (eight) hours as needed.   lipase/protease/amylase (CREON) 36000 UNITS CPEP capsule Take 2 capsules (72,000 Units total) by mouth 3 (three) times daily with meals.   Multiple Vitamin (MULTIVITAMIN) tablet Take 1 tablet by mouth daily.   omeprazole (PRILOSEC) 20 MG capsule TAKE 1 CAPSULE BY MOUTH EVERY DAY    Allergies: Ezetimibe, Lisinopril, Statins, and Losartan  Social History   Tobacco Use   Smoking status: Never   Smokeless tobacco: Never  Vaping Use  Vaping Use: Never used  Substance Use Topics   Alcohol use: Not Currently   Drug use: No    Family History  Problem Relation Age of Onset   Thyroid disease Mother    Hypertension Mother    Hyperlipidemia Mother     Alzheimer's disease Mother    Heart failure Mother    Esophageal cancer Father    Hyperlipidemia Father    Heart murmur Father    Colon polyps Father    COPD Father    Aneurysm Father    Throat cancer Father    Diabetes Sister    Breast cancer Sister    Irritable bowel syndrome Sister    Breast cancer Sister    Ulcerative colitis Sister    Rheum arthritis Sister    Alzheimer's disease Maternal Aunt    Alzheimer's disease Maternal Uncle    Diabetes Paternal Uncle    Alzheimer's disease Maternal Grandmother    Diabetes Paternal Grandmother    Breast cancer Cousin    Breast cancer Cousin    Colon cancer Neg Hx    Stomach cancer Neg Hx    Rectal cancer Neg Hx     Review of Systems: A 12-system review of systems was performed and was negative except as noted in the HPI.  --------------------------------------------------------------------------------------------------  Physical Exam: BP 130/84 (BP Location: Right Arm, Patient Position: Sitting, Cuff Size: Normal)   Pulse 88   Ht 5\' 5"  (1.651 m)   Wt 169 lb 3.2 oz (76.7 kg)   SpO2 97%   BMI 28.16 kg/m   General:  NAD. Neck: No JVD or HJR. Lungs: Clear to auscultation bilaterally without wheezes or crackles. Heart: Regular rate and rhythm without murmurs, rubs, or gallops. Abdomen: Soft, nontender, nondistended. Extremities: No lower extremity edema.  Lab Results  Component Value Date   WBC 6.6 10/22/2022   HGB 12.9 10/22/2022   HCT 38.4 10/22/2022   MCV 84.8 10/22/2022   PLT 270.0 10/22/2022    Lab Results  Component Value Date   NA 142 10/22/2022   K 3.9 10/22/2022   CL 104 10/22/2022   CO2 29 10/22/2022   BUN 15 10/22/2022   CREATININE 0.78 10/22/2022   GLUCOSE 100 (H) 10/22/2022   ALT 12 10/22/2022    Lab Results  Component Value Date   CHOL 151 05/14/2022   HDL 50.90 05/14/2022   LDLCALC 62 05/14/2022   LDLDIRECT 209.0 06/04/2019   TRIG 190.0 (H) 05/14/2022   CHOLHDL 3 05/14/2022     --------------------------------------------------------------------------------------------------  ASSESSMENT AND PLAN: Hypertension: Blood pressure improved today.  Continue amlodipine 5 mg daily.  Female heterozygous hyperlipidemia: Mr. Valentino Saxon is tolerating monthly autoinjector formulation of Repatha well.  I informed her that this formulation may no longer be available in the near future, at which time our lipid clinic can help her transition to alternative therapy.  Leqvio found to be cost prohibitive.  She will be due for repeat lipid panel around August or September, which can be done through her PCPs office or our clinic.  Follow-up: Return to clinic in 1 year.  Leah Kendall, MD 01/30/2023 10:23 AM

## 2023-01-30 NOTE — Patient Instructions (Addendum)
Medication Instructions:  Your Physician recommend you continue on your current medication as directed.    *If you need a refill on your cardiac medications before your next appointment, please call your pharmacy*   Lab Work: Your physician recommends that you have Lipid panel completed August or September at your PCP office. Please have them fax results to [phone] for review.    Testing/Procedures: None ordered today   Follow-Up: At [facility], you and your health needs are our priority.  As part of our continuing mission to provide you with exceptional heart care, we have created designated Provider Care Teams.  These Care Teams include your primary Cardiologist (physician) and Advanced Practice Providers (APPs -  Physician Assistants and Nurse Practitioners) who all work together to provide you with the care you need, when you need it.  We recommend signing up for the patient portal called "MyChart".  Sign up information is provided on this After Visit Summary.  MyChart is used to connect with patients for Virtual Visits (Telemedicine).  Patients are able to view lab/test results, encounter notes, upcoming appointments, etc.  Non-urgent messages can be sent to your provider as well.   To learn more about what you can do with MyChart, go to [email or web address].    Your next appointment:   1 year   Provider:   You may see Yvonne Kendall, MD or one of the following Advanced Practice Providers on your designated Care Team:   Nicolasa Ducking, NP Eula Listen, PA-C Cadence Fransico Michael, PA-C Charlsie Quest, NP

## 2023-02-14 ENCOUNTER — Telehealth: Payer: Self-pay

## 2023-02-14 NOTE — Progress Notes (Signed)
Care Management & Coordination Services Pharmacy Team  Reason for Encounter: Appointment Reminder  Contacted patient to confirm telephone appointment with Al Corpus , PharmD on 02/19/23 at 11:45. Spoke with patient on 02/14/2023   Do you have any problems getting your medications? No  What is your top health concern you would like to discuss at your upcoming visit?   Patient reports recent changes in medication and tolerating well   Have you seen any other providers since your last visit with PCP? Yes  [facility]aster[facility]ology  Hospital visits:  None in previous 6 months   Star Rating Drugs:  Medication:  Last Fill: Day Supply No star medications identified  Care Gaps: Annual wellness visit in last year? Yes   Al Corpus, PharmD notified  Burt Knack, [facility] Clinical Pharmacy Assistant [phone]

## 2023-02-17 ENCOUNTER — Telehealth: Payer: Self-pay | Admitting: Gastroenterology

## 2023-02-17 NOTE — Telephone Encounter (Signed)
Inbound call from patient wanting to speak with a nurse , patient stated she is having a allergic reaction to "Creon". Please advise.

## 2023-02-18 NOTE — Telephone Encounter (Signed)
Patient new on Creon since 01/24/23. Doing well. Past week she developed a flushing and redness of her face and rash on her bottom that is itches. She stopped the Creon on Sunday. Rash and flushing of the face has improved. She felt she had improvement on her GI issues on Creon. She is not sure if she should try it again. Please advise.

## 2023-02-19 ENCOUNTER — Ambulatory Visit: Payer: PPO | Admitting: Pharmacist

## 2023-02-19 DIAGNOSIS — F419 Anxiety disorder, unspecified: Secondary | ICD-10-CM

## 2023-02-19 NOTE — Progress Notes (Signed)
Care Management & Coordination Services Pharmacy Note  02/19/2023 Name:  [name] MRN:  [id number] DOB:  1955-10-11  Summary: F/U visit -Anxiety: pt reports improvement with duloxetine, she is taking 60 mg (2 x 30 mg) per day and decided to self-taper off of bupropion recently, she reports doing just as well with only duloxetine and does not wish to resume bupropion; discussed contacting provider before stopping medication in the future -Familial HLD: LDL 62 (04/2022) improved on Repatha; Discussed Pushtronex device discontinuation end of June, she did not do well with Sureclicks auto-injector; -Pancreatic insufficiency: pt reports Creon was helpful but she developed a rash, she has contacted GI for alternative; discussed there are alternatives available (ie, Zenpep)  Recommendations/Changes made from today's visit: -Update Duloxetine Rx to 60 mg capsule daily -advised she refill her Pushtronex device before end of June  -Advised to f/u with GI for Creon alternative  Follow up plan: -Health Concierge will call patient 3 months for general update -Pharmacist follow up televisit scheduled for 6 months -DEXA 06/19/23    Subjective: [name] is an 67 y.o. year old female who is a primary patient of Mort Sawyers, FNP.  The care coordination team was consulted for assistance with disease management and care coordination needs.    Engaged with patient by telephone for follow up visit. Patient lives at home with her husband and granddaughter. Her granddaughter is on the autism spectrum and pt has helped care for her while patient's daughter "works on herself".   Recent office visits: 10/22/22-Tabitha Dugal,FNP OV: RLQ abd pain, nausea. D/C estrogen cream (not taking). Ordered CT abdomen to r/o cholecystitis.   10/07/22-Tabitha Dugal,FNP OV: annual - ordered DEXA, mammogram. D/C fish oil (not taking).  06/18/22-Tabitha Dugal,FNP OV: chronic fatigue, sinusitis. Rx Augmentin, fluconazole.  D/C lisinopril and start amlodipine 2.5 mg.  Recent consult visits: 01/30/23 Dr End (Cardiology): Tolerating Repatha Pushtronex which may not be available soon.  01/24/23 Dr Lavon Paganini (GI): chronic pancreatitis. Continue Creon.  12/10/22 GI stool test results: pancreatic elastase stool test low, start Creon 36K.  11/26/22-Arpita Maniar,MD [facility] ophthalmology): choroidal nevus  10/31/22 Dr End (Cardiology): Familial HLD,- discussed Leqvio as an alternative therapy; BP 142/86 - increase amlodipine to 5 mg daily. F/u 3 months  10/29/22 Colleen Kennedy-Smith,NP (GI): fatty pancreas, abd pain. CT suggests pancreatic insufficiency. Consider pancreatic MRI. Rec RUQ sono and EGD.  10/16/22-Keirnan Willett,MD(ophthal)- no data 06/11/22-Govinda Brahmanday,MD(onc)-consult elevated B12 levels.Labs,  Hospital visits: None in previous 6 months   Objective:  Lab Results  Component Value Date   CREATININE 0.78 10/22/2022   BUN 15 10/22/2022   GFR 79.30 10/22/2022   GFRNONAA >60 06/11/2022   GFRAA >60 02/01/2019   NA 142 10/22/2022   K 3.9 10/22/2022   CALCIUM 9.2 10/22/2022   CO2 29 10/22/2022   GLUCOSE 100 (H) 10/22/2022    Lab Results  Component Value Date/Time   HGBA1C 6.4 09/27/2020 10:10 AM   HGBA1C 6.4 03/08/2014 08:42 AM   GFR 79.30 10/22/2022 09:33 AM   GFR 91.29 10/01/2021 10:38 AM   MICROALBUR <0.7 10/07/2022 12:58 PM    Last diabetic Eye exam: No results found for: "HMDIABEYEEXA"  Last diabetic Foot exam: No results found for: "HMDIABFOOTEX"   Lab Results  Component Value Date   CHOL 151 05/14/2022   HDL 50.90 05/14/2022   LDLCALC 62 05/14/2022   LDLDIRECT 209.0 06/04/2019   TRIG 190.0 (H) 05/14/2022   CHOLHDL 3 05/14/2022       Latest Ref  Rng & Units 10/22/2022    9:33 AM 06/11/2022   12:13 PM 01/11/2022   10:55 AM  Hepatic Function  Total Protein 6.0 - 8.3 g/dL 7.5  7.8  7.5   Albumin 3.5 - 5.2 g/dL 4.4  4.0  4.0   AST 0 - 37 U/L 15  21  22    ALT 0 - 35 U/L 12  15   18    Alk Phosphatase 39 - 117 U/L 70  74  67   Total Bilirubin 0.2 - 1.2 mg/dL 0.4  0.5  0.5     Lab Results  Component Value Date/Time   TSH 1.71 03/28/2022 01:24 PM   TSH 2.14 10/01/2021 10:38 AM   FREET4 0.84 02/16/2015 11:02 AM   FREET4 0.67 03/08/2014 08:42 AM       Latest Ref Rng & Units 10/22/2022    9:33 AM 06/11/2022   12:13 PM 10/01/2021   10:38 AM  CBC  WBC 4.0 - 10.5 K/uL 6.6  5.1  4.7   Hemoglobin 12.0 - 15.0 g/dL 16.1  09.6  04.5   Hematocrit 36.0 - 46.0 % 38.4  37.4  39.6   Platelets 150.0 - 400.0 K/uL 270.0  246  253.0     Lab Results  Component Value Date/Time   VD25OH 28.50 (L) 02/16/2015 11:02 AM   VD25OH 30.28 03/08/2014 08:42 AM   VITAMINB12 3,386 (H) 06/11/2022 12:13 PM   VITAMINB12 >1500 (H) 05/14/2022 11:32 AM    Clinical ASCVD: Yes  The 10-year ASCVD risk score (Arnett DK, et al., 2019) is: 7.7%   Values used to calculate the score:     Age: 27 years     Sex: Female     Is Non-Hispanic African American: No     Diabetic: No     Tobacco smoker: No     Systolic Blood Pressure: 130 mmHg     Is BP treated: Yes     HDL Cholesterol: 50.9 mg/dL     Total Cholesterol: 151 mg/dL       4/0/9811    9:14 PM 10/07/2022   12:33 PM 10/04/2022    9:35 AM  Depression screen PHQ 2/9  Decreased Interest 0 0 0  Down, Depressed, Hopeless 0 0 0  PHQ - 2 Score 0 0 0  Altered sleeping 0 0   Tired, decreased energy 0 0   Change in appetite 0 0   Feeling bad or failure about yourself  0 0   Trouble concentrating 0 0   Moving slowly or fidgety/restless 0 0   Suicidal thoughts 0 0   PHQ-9 Score 0 0   Difficult doing work/chores Not difficult at all Not difficult at all        10/22/2022    2:35 PM 10/07/2022   12:33 PM 01/25/2020    4:03 PM 12/15/2018    8:12 AM  GAD 7 : Generalized Anxiety Score  Nervous, Anxious, on Edge 0 0 0 0  Control/stop worrying 0 0 1 0  Worry too much - different things 0 0 1 0  Trouble relaxing 0 0 2 0  Restless 0  0 1  Easily  annoyed or irritable 0 0 2 0  Afraid - awful might happen 0 0 0 0  Total GAD 7 Score 0  6 1  Anxiety Difficulty Not difficult at all Not difficult at all Somewhat difficult Not difficult at all     Social History   Tobacco Use  Smoking  Status Never  Smokeless Tobacco Never   BP Readings from Last 3 Encounters:  01/30/23 130/84  01/24/23 120/80  01/10/23 117/76   Pulse Readings from Last 3 Encounters:  01/30/23 88  01/24/23 80  10/31/22 79   Wt Readings from Last 3 Encounters:  01/30/23 169 lb 3.2 oz (76.7 kg)  01/24/23 169 lb (76.7 kg)  10/31/22 172 lb 8 oz (78.2 kg)   BMI Readings from Last 3 Encounters:  01/30/23 28.16 kg/m  01/24/23 28.12 kg/m  10/31/22 28.71 kg/m    Allergies  Allergen Reactions   Ezetimibe Other (See Comments)    Fatigue and constipation   Lisinopril Other (See Comments)    Increased fatigue, sleeping more than needed   Statins Other (See Comments)    Muscle aches   Losartan Other (See Comments)    Fatigue, malaise    Medications Reviewed Today     Reviewed by Kathyrn Sheriff, [facility] (Pharmacist) on 02/19/23 at 1200  Med List Status: <None>   Medication Order Taking? Sig Documenting Provider Last Dose Status Informant  amLODipine (NORVASC) 5 MG tablet [id number] Yes Take 1 tablet (5 mg total) by mouth daily. End, Cristal Deer, MD Taking Active   azelastine (ASTELIN) 0.1 % nasal spray [id number] Yes SPRAY 1-2 SPRAYS IN EACH NOSTRIL TWICE DAILY. [provider] Taking Active   DULoxetine (CYMBALTA) 30 MG capsule [id number] Yes TAKE 1 CAPSULE (30 MG TOTAL) BY MOUTH DAILY. START AFTER ESCITALOPRAM TAPER IS COMPLETE  Patient taking differently: Take 60 mg by mouth daily. Start after escitalopram taper is complete   Mort Sawyers, FNP Taking Active   Evolocumab with Infusor (REPATHA PUSHTRONEX SYSTEM) 420 MG/3.5ML SOCT [id number] Yes Inject 3.6 mLs into the skin every 30 (thirty) days. End, Cristal Deer, MD Taking Active   fluticasone  [facility]) 50 MCG/ACT nasal spray [id number] Yes Place 2 sprays into both nostrils daily. Mort Sawyers, FNP Taking Active   glucosamine-chondroitin 500-400 MG tablet [id number] Yes Take 1 tablet by mouth. [provider] Taking Active   ibuprofen (ADVIL) 800 MG tablet [id number] Yes Take 1 tablet (800 mg total) by mouth every 8 (eight) hours as needed. Gweneth Dimitri, MD Taking Active   lipase/protease/amylase (CREON) 36000 UNITS CPEP capsule [id number] Yes Take 2 capsules (72,000 Units total) by mouth 3 (three) times daily with meals. Napoleon Form, MD Taking Active   Multiple Vitamin (MULTIVITAMIN) tablet [id number] Yes Take 1 tablet by mouth daily. [provider] Taking Active Self  omeprazole (PRILOSEC) 20 MG capsule [id number] Yes TAKE 1 CAPSULE BY MOUTH EVERY DAY Dugal, Tabitha, FNP Taking Active             SDOH:  (Social Determinants of Health) assessments and interventions performed: No - done Jan2024 SDOH Interventions    Flowsheet Row Clinical Support from 10/04/2022 in [facility] Office Visit from 01/25/2020 in [facility] [place]  SDOH Interventions    Food Insecurity Interventions Intervention Not Indicated --  Housing Interventions Intervention Not Indicated --  Transportation Interventions Intervention Not Indicated --  Depression Interventions/Treatment  -- TKZ6-0 Score <4 Follow-up Not Indicated, Currently on Treatment  Financial Strain Interventions Intervention Not Indicated --  Physical Activity Interventions Intervention Not Indicated --  Stress Interventions Intervention Not Indicated --  Social Connections Interventions Intervention Not Indicated --       Medication Assistance: None required.  Patient affirms current coverage meets needs. - pt has LIS/Extra help  Medication Access: Within the past  30 days, how often has patient missed a dose of medication? 0 Is a pillbox or other  method used to improve adherence? Yes  Factors that may affect medication adherence? no barriers identified Are meds synced by current pharmacy? No  Are meds delivered by current pharmacy? No  Does patient experience delays in picking up medications due to transportation concerns? No  Name and location of Current pharmacy:  [id number] [place], [place] - [address] [place] [address] Phone: [phone] Fax: [phone]  [id number] [place] [place] - [address] [place] [address] Phone: [phone] Fax: [phone]   Compliance/Adherence/Medication fill history: Care Gaps: None  Star-Rating Drugs: None   Assessment/Plan  Hypertension (BP goal <130/80) -Controlled - pt reports BP has been lower lately, she has lost 5 lbs and anxiety is improved -Current home BP readings: 117/76 -Current treatment: Amlodipine 5 mg daily - Appropriate, Effective, Safe, Accessible -Medications previously tried: lisinopril, hctz  -Denies hypotensive/hypertensive symptoms -Educated on BP goals and benefits of medications for prevention of heart attack, stroke and kidney damage; Symptoms of hypotension and importance of maintaining adequate hydration; -Counseled to monitor BP at home daily; discussed contacting provider if BP < 100/60 -Recommended to continue current medication  Familial Hyperlipidemia: (LDL goal < 70) -Controlled - LDL 62 (04/2022), improved from 213 with PCSK9-I; pt recently switched to Pushtronex device, however this will be discontinued by July 2024; she reports Repatha is $11 per month -Follows with cardiology; per team Leqvio was cost-prohibitive with HTA -Current treatment: Repatha Pushtronex 420 mg q30 days - Appropriate, Effective, Safe, Accessible -Medications previously tried: pravastatin, rosuvastatin, simvastatin  -Educated on Cholesterol goals;  -Discussed Pushtronex device discontinuation, she did not do  well with Sureclicks auto-injector; advised she refill her Pushtronex device before end of June  -Recommended to continue current medication  Depression/Anxiety (Goal: manage symptoms) -Improved - pt tapered off bupropion on her own, feels well with duloxetine alone -PHQ9: 0 (10/2022) -GAD7: 0 (10/2022) -Connected with PCP for mental health support -Current treatment: Bupropion XL 150 mg daily - tapered off. Removed from list Duloxetine 30 mg - 2 cap daily - Appropriate, Effective, Safe, Accessible -Medications previously tried/failed: citalopram, fluoxetine, venlafaxine (all ineffective after a period) -Advised to contact provider before stopping medication in future -Recommend update duloxetine rx to 60 mg capsules  Pancreatic insufficiency  -Not ideally controlled - pt stopped taking Creon due to rash, which improved without Creon; Creon did help with GI symptoms and she has asked GI about alternative -Follows with GI (Dr Lavon Paganini) -Current treatment  Creon 36,000 units - 2 cap TID w/ meals - not taking -Medications previously tried: n/a  -Discussed that alternatives to Creon are available, advised to await decision from GI  GERD (Goal: minimize symptoms of reflux ) -Controlled - pt was told to use omeprazole daily for a month to see if it helped GI symptoms, she is not sure it has -Current treatment  Omeprazole 20 mg PRN - Appropriate, Effective, Safe, Accessible -Medications previously tried: none reported  -Reviewed long term side effects of PPI including reduced bone density/fracture risk; pt has not had a DEXA since 2010, pt has now scheduled DEXA for Oct 2024 -Recommended to continue current medication  Prediabetes (Goal: A1c < 6.5%) -Query Controlled - A1c 6.4% (09/2020) -Recommend repeat A1c at next OV  Health Maintenance -Vaccine gaps: Shingrix -Allergies: flonase, astelin spray -Headache/pain:  pt uses ibuprofen 800 mg very rarely -  Hx Breast cancer (2008), tamoxifen x  3 years. Remote hx hysterectomy -OTC: multivitamin, Cinnamon supplement, Turmeric supplement   Al Corpus, PharmD, [place], CPP Clinical Sports administrator Healthcare at [facility] [phone]

## 2023-02-21 MED ORDER — DULOXETINE HCL 60 MG PO CPEP: 60.0000 mg | ORAL_CAPSULE | Freq: Every day | ORAL | 1 refills | Status: DC

## 2023-02-25 NOTE — Telephone Encounter (Signed)
Please advise patient to contact PMD for rash on her bottom and flushing, feel it is likely unrelated to Creon.

## 2023-02-25 NOTE — Telephone Encounter (Signed)
Patient reports her symptoms of flushing and rash are gone. She will try Creon again.

## 2023-02-27 ENCOUNTER — Other Ambulatory Visit: Payer: Self-pay | Admitting: Family

## 2023-02-27 ENCOUNTER — Encounter: Payer: Self-pay | Admitting: Family

## 2023-02-27 DIAGNOSIS — K2960 Other gastritis without bleeding: Secondary | ICD-10-CM

## 2023-02-28 NOTE — Telephone Encounter (Signed)
Patient sent [place] a message through My Chart.  I had asked her to let us know if she had further problems.  "I started   creon yesterday today I have the itching a headache & gas  I cant take this!      [name] 01/24/1956"  She has been instructed to hold the Creon pending your review.

## 2023-03-04 NOTE — Telephone Encounter (Signed)
Agree with holding Creon.  Please schedule MRI/MRCP of pancreas and follow-up office visit after that.  Thank you

## 2023-03-05 ENCOUNTER — Other Ambulatory Visit: Payer: Self-pay

## 2023-03-05 DIAGNOSIS — K8689 Other specified diseases of pancreas: Secondary | ICD-10-CM

## 2023-03-05 DIAGNOSIS — I10 Essential (primary) hypertension: Secondary | ICD-10-CM | POA: Diagnosis not present

## 2023-03-05 DIAGNOSIS — K861 Other chronic pancreatitis: Secondary | ICD-10-CM

## 2023-03-05 DIAGNOSIS — E785 Hyperlipidemia, unspecified: Secondary | ICD-10-CM | POA: Diagnosis not present

## 2023-03-05 DIAGNOSIS — F419 Anxiety disorder, unspecified: Secondary | ICD-10-CM | POA: Diagnosis not present

## 2023-03-05 DIAGNOSIS — E663 Overweight: Secondary | ICD-10-CM | POA: Diagnosis not present

## 2023-03-05 NOTE — Telephone Encounter (Signed)
Called the patient. No answer. Left her a message to return my call on her voicemail.

## 2023-03-05 NOTE — Telephone Encounter (Signed)
Spoke with the patient. She agrees to this plan of care. Asks to have her MRI/MRCP in [place].  Orders placed. Radiology scheduling notified. Appointment 06/19/23.

## 2023-03-05 NOTE — Addendum Note (Signed)
Addended by: He[place] A on: 03/05/2023 01:33 PM   Modules accepted: Orders

## 2023-03-24 ENCOUNTER — Other Ambulatory Visit: Payer: Self-pay | Admitting: Internal Medicine

## 2023-03-24 ENCOUNTER — Ambulatory Visit
Admission: RE | Admit: 2023-03-24 | Discharge: 2023-03-24 | Disposition: A | Discharge status: Home / Self Care | Payer: PPO | Source: Ambulatory Visit | Attending: Internal Medicine | Admitting: Internal Medicine

## 2023-03-24 DIAGNOSIS — K861 Other chronic pancreatitis: Secondary | ICD-10-CM

## 2023-03-24 DIAGNOSIS — R109 Unspecified abdominal pain: Secondary | ICD-10-CM | POA: Diagnosis not present

## 2023-03-24 DIAGNOSIS — K8689 Other specified diseases of pancreas: Secondary | ICD-10-CM

## 2023-03-24 DIAGNOSIS — R630 Anorexia: Secondary | ICD-10-CM | POA: Diagnosis not present

## 2023-03-24 DIAGNOSIS — R110 Nausea: Secondary | ICD-10-CM | POA: Diagnosis not present

## 2023-03-24 MED ORDER — GADOBUTROL 1 MMOL/ML IV SOLN
6.0000 mL | Freq: Once | INTRAVENOUS | Status: AC | PRN
  Administered 2023-03-24: 6 mL via INTRAVENOUS

## 2023-03-25 DIAGNOSIS — D3131 Benign neoplasm of right choroid: Secondary | ICD-10-CM | POA: Diagnosis not present

## 2023-04-08 ENCOUNTER — Ambulatory Visit (INDEPENDENT_AMBULATORY_CARE_PROVIDER_SITE_OTHER): Payer: PPO | Admitting: Family

## 2023-04-08 ENCOUNTER — Encounter: Payer: Self-pay | Admitting: Family

## 2023-04-08 VITALS — BP 142/80 | HR 95 | Temp 97.7°F | Ht 65.0 in | Wt 170.0 lb

## 2023-04-08 DIAGNOSIS — J029 Acute pharyngitis, unspecified: Secondary | ICD-10-CM

## 2023-04-08 DIAGNOSIS — F411 Generalized anxiety disorder: Secondary | ICD-10-CM | POA: Diagnosis not present

## 2023-04-08 DIAGNOSIS — F419 Anxiety disorder, unspecified: Secondary | ICD-10-CM | POA: Diagnosis not present

## 2023-04-08 DIAGNOSIS — J020 Streptococcal pharyngitis: Secondary | ICD-10-CM | POA: Insufficient documentation

## 2023-04-08 LAB — POCT RAPID STREP A (OFFICE): Rapid Strep A Screen: POSITIVE — AB

## 2023-04-08 MED ORDER — HYDROXYZINE PAMOATE 25 MG PO CAPS: 25.0000 mg | ORAL_CAPSULE | Freq: Three times a day (TID) | ORAL | 0 refills | Status: DC | PRN

## 2023-04-08 MED ORDER — AMOXICILLIN 875 MG PO TABS
875.0000 mg | ORAL_TABLET | Freq: Two times a day (BID) | ORAL | 0 refills | Status: AC
Start: 2023-04-08 — End: 2023-04-18

## 2023-04-08 NOTE — Patient Instructions (Signed)
------------------------------------    You were found to be strep positive,  Take antibiotics that have been sent to the pharmacy.  Change your toothbrush after 24 hours on the antibiotics.  Gargle with warm salt water as needed for sore throat.   ------------------------------------  

## 2023-04-08 NOTE — Assessment & Plan Note (Addendum)
Strep tested positive in office.  rx for amox 875 mg po bid x 10 days.  Ibuprofen/tyelnol prn sore throat/fever Pt told to F/u if no improvement in the next 2-3 days.

## 2023-04-08 NOTE — Progress Notes (Signed)
Established Patient Office Visit  Subjective:   Patient ID: [name], female    DOB: 12-Mar-1956  Age: 67 y.o. MRN: [id number]  CC:  Chief Complaint  Patient presents with   Sore Throat    HPI: [name] is a 68 y.o. female presenting on 04/08/2023 for Sore Throat  One week ago started with increasing fatigue, sore throat, hoarseness, nasal drainage, right ear fullness. Has noticed white spots on her throat.   No fever or chills.   Anxiety, back in June 2024 , still on 60 mg of cymbalta. She self d/c the wellbutrin didn't seem to be helping. She does feel this has helped until the last few weeks she has had increased anxiety and increased stress.       ROS: Negative unless specifically indicated above in HPI.   Relevant past medical history reviewed and updated as indicated.   Allergies and medications reviewed and updated.   Current Outpatient Medications:    amoxicillin (AMOXIL) 875 MG tablet, Take 1 tablet (875 mg total) by mouth 2 (two) times daily for 10 days., Disp: 20 tablet, Rfl: 0   hydrOXYzine (VISTARIL) 25 MG capsule, Take 1 capsule (25 mg total) by mouth every 8 (eight) hours as needed., Disp: 30 capsule, Rfl: 0   amLODipine (NORVASC) 5 MG tablet, Take 1 tablet (5 mg total) by mouth daily., Disp: 90 tablet, Rfl: 3   azelastine (ASTELIN) 0.1 % nasal spray, SPRAY 1-2 SPRAYS IN EACH NOSTRIL TWICE DAILY., Disp: , Rfl:    DULoxetine (CYMBALTA) 60 MG capsule, Take 1 capsule (60 mg total) by mouth daily., Disp: 90 capsule, Rfl: 1   Evolocumab with Infusor (REPATHA PUSHTRONEX SYSTEM) 420 MG/3.5ML SOCT, Inject 3.6 mLs into the skin every 30 (thirty) days., Disp: 3.6 mL, Rfl: 11   fluticasone (FLONASE) 50 MCG/ACT nasal spray, Place 2 sprays into both nostrils daily., Disp: 16 g, Rfl: 6   glucosamine-chondroitin 500-400 MG tablet, Take 1 tablet by mouth., Disp: , Rfl:    ibuprofen (ADVIL) 800 MG tablet, Take 1 tablet (800 mg total) by mouth every 8 (eight) hours  as needed., Disp: 90 tablet, Rfl: 0   Multiple Vitamin (MULTIVITAMIN) tablet, Take 1 tablet by mouth daily., Disp: , Rfl:    omeprazole (PRILOSEC) 20 MG capsule, TAKE 1 CAPSULE BY MOUTH EVERY DAY, Disp: 90 capsule, Rfl: 0  Allergies  Allergen Reactions   Ezetimibe Other (See Comments)    Fatigue and constipation   Lisinopril Other (See Comments)    Increased fatigue, sleeping more than needed   Statins Other (See Comments)    Muscle aches   Creon [Pancrelipase (Lip-Prot-Amyl)] Other (See Comments)    itching   Losartan Other (See Comments)    Fatigue, malaise    Objective:   BP (!) 142/80   Pulse 95   Temp 97.7 F (36.5 C) (Temporal)   Ht 5\' 5"  (1.651 m)   Wt 170 lb (77.1 kg)   SpO2 97%   BMI 28.29 kg/m    Physical Exam Constitutional:      General: She is not in acute distress.    Appearance: Normal appearance. She is normal weight. She is not ill-appearing, toxic-appearing or diaphoretic.  HENT:     Head: Normocephalic.     Right Ear: Tympanic membrane normal.     Left Ear: Tympanic membrane normal.     Nose: Nose normal.     Mouth/Throat:     Mouth: Mucous membranes are dry.  Pharynx: Posterior oropharyngeal erythema present. No oropharyngeal exudate.     Tonsils: Tonsillar exudate present.  Eyes:     Extraocular Movements: Extraocular movements intact.     Pupils: Pupils are equal, round, and reactive to light.  Cardiovascular:     Rate and Rhythm: Normal rate and regular rhythm.     Pulses: Normal pulses.     Heart sounds: Normal heart sounds.  Pulmonary:     Effort: Pulmonary effort is normal.     Breath sounds: Normal breath sounds.  Musculoskeletal:     Cervical back: Normal range of motion.  Lymphadenopathy:     Cervical: Cervical adenopathy present.  Neurological:     General: No focal deficit present.     Mental Status: She is alert and oriented to person, place, and time. Mental status is at baseline.  Psychiatric:        Mood and Affect:  Mood normal.        Behavior: Behavior normal.        Thought Content: Thought content normal.        Judgment: Judgment normal.     Assessment & Plan:  Sore throat -     POCT rapid strep A  Anxiety state -     hydrOXYzine Pamoate; Take 1 capsule (25 mg total) by mouth every 8 (eight) hours as needed.  Dispense: 30 capsule; Refill: 0  Anxiety Assessment & Plan: Worsening recently  Hydroxyxine 25 mg trial prn  Continue duloxetine 60 mg    Strep pharyngitis Assessment & Plan: Strep tested positive in office.  rx for amox 875 mg po bid x 10 days.  Ibuprofen/tyelnol prn sore throat/fever Pt told to F/u if no improvement in the next 2-3 days.   Orders: -     Amoxicillin; Take 1 tablet (875 mg total) by mouth 2 (two) times daily for 10 days.  Dispense: 20 tablet; Refill: 0     Follow up plan: Return if symptoms worsen or fail to improve.  Mort Sawyers, FNP

## 2023-04-08 NOTE — Assessment & Plan Note (Signed)
Worsening recently  Hydroxyxine 25 mg trial prn  Continue duloxetine 60 mg

## 2023-04-16 DIAGNOSIS — M79661 Pain in right lower leg: Secondary | ICD-10-CM | POA: Diagnosis not present

## 2023-04-16 DIAGNOSIS — I83893 Varicose veins of bilateral lower extremities with other complications: Secondary | ICD-10-CM | POA: Diagnosis not present

## 2023-04-16 DIAGNOSIS — R600 Localized edema: Secondary | ICD-10-CM | POA: Diagnosis not present

## 2023-04-16 DIAGNOSIS — M79604 Pain in right leg: Secondary | ICD-10-CM | POA: Diagnosis not present

## 2023-04-22 ENCOUNTER — Encounter: Payer: Self-pay | Admitting: Family

## 2023-04-23 NOTE — Telephone Encounter (Signed)
Good morning! I was curious if you could help me with coming up with a medication change option. She was recently changed from lexapro to cymbalta which she did well on , but was on wellbutrin with lexapro before the change. Weaned off of that as well. She wants to restart the wellbutrin but this is not recommended with an SNRI correct? What about buspirone? I tried hydroxyxine for her 'panic' moments but made her too drowsy. Any other thoughts? I hate to change her again to something like venlafaxine. Thank you!

## 2023-04-24 ENCOUNTER — Other Ambulatory Visit: Payer: Self-pay | Admitting: Family

## 2023-04-24 ENCOUNTER — Encounter: Payer: Self-pay | Admitting: Family

## 2023-04-24 DIAGNOSIS — F411 Generalized anxiety disorder: Secondary | ICD-10-CM

## 2023-04-24 MED ORDER — BUSPIRONE HCL 5 MG PO TABS: 5.0000 mg | ORAL_TABLET | Freq: Two times a day (BID) | ORAL | 0 refills | Status: DC

## 2023-04-24 NOTE — Telephone Encounter (Signed)
I appreciate your insight, thank you! Will start her on buspirone.

## 2023-05-05 DIAGNOSIS — I83891 Varicose veins of right lower extremities with other complications: Secondary | ICD-10-CM | POA: Diagnosis not present

## 2023-05-06 ENCOUNTER — Encounter: Payer: Self-pay | Admitting: Family

## 2023-05-06 DIAGNOSIS — F411 Generalized anxiety disorder: Secondary | ICD-10-CM

## 2023-05-07 MED ORDER — BUSPIRONE HCL 10 MG PO TABS: 10.0000 mg | ORAL_TABLET | Freq: Two times a day (BID) | ORAL | Status: DC

## 2023-05-07 MED ORDER — DULOXETINE HCL 30 MG PO CPEP
30.0000 mg | ORAL_CAPSULE | Freq: Every day | ORAL | 0 refills | Status: DC
Start: 2023-05-07 — End: 2023-07-02

## 2023-05-07 NOTE — Addendum Note (Signed)
Addended by: Mort Sawyers on: 05/07/2023 08:31 AM   Modules accepted: Orders

## 2023-05-12 ENCOUNTER — Encounter: Payer: Self-pay | Admitting: Family

## 2023-05-13 DIAGNOSIS — I83892 Varicose veins of left lower extremities with other complications: Secondary | ICD-10-CM | POA: Diagnosis not present

## 2023-05-27 DIAGNOSIS — I83891 Varicose veins of right lower extremities with other complications: Secondary | ICD-10-CM | POA: Diagnosis not present

## 2023-05-27 DIAGNOSIS — I8001 Phlebitis and thrombophlebitis of superficial vessels of right lower extremity: Secondary | ICD-10-CM | POA: Diagnosis not present

## 2023-05-28 ENCOUNTER — Other Ambulatory Visit: Payer: Self-pay | Admitting: Family Medicine

## 2023-05-28 DIAGNOSIS — K2960 Other gastritis without bleeding: Secondary | ICD-10-CM

## 2023-05-29 ENCOUNTER — Other Ambulatory Visit: Payer: Self-pay | Admitting: Family

## 2023-05-29 DIAGNOSIS — J309 Allergic rhinitis, unspecified: Secondary | ICD-10-CM

## 2023-06-02 DIAGNOSIS — I83812 Varicose veins of left lower extremities with pain: Secondary | ICD-10-CM | POA: Diagnosis not present

## 2023-06-02 DIAGNOSIS — I83892 Varicose veins of left lower extremities with other complications: Secondary | ICD-10-CM | POA: Diagnosis not present

## 2023-06-10 DIAGNOSIS — M7989 Other specified soft tissue disorders: Secondary | ICD-10-CM | POA: Diagnosis not present

## 2023-06-10 DIAGNOSIS — I83891 Varicose veins of right lower extremities with other complications: Secondary | ICD-10-CM | POA: Diagnosis not present

## 2023-06-10 DIAGNOSIS — I83811 Varicose veins of right lower extremities with pain: Secondary | ICD-10-CM | POA: Diagnosis not present

## 2023-06-16 ENCOUNTER — Telehealth: Payer: Self-pay | Admitting: Internal Medicine

## 2023-06-16 DIAGNOSIS — I83892 Varicose veins of left lower extremities with other complications: Secondary | ICD-10-CM | POA: Diagnosis not present

## 2023-06-16 DIAGNOSIS — I83812 Varicose veins of left lower extremities with pain: Secondary | ICD-10-CM | POA: Diagnosis not present

## 2023-06-16 IMAGING — MG DIGITAL DIAGNOSTIC BILAT W/ TOMO W/ CAD
8 series · 8 of 24 positions shown · non-contrast
Comparison: Previous exam(s).

CLINICAL DATA: Short-term follow-up for a probably benign left
breast asymmetry, initially assessed on 12/12/2020. Patient has a
history right lumpectomy for breast carcinoma, performed in 3007.

EXAM:
DIGITAL DIAGNOSTIC BILATERAL MAMMOGRAM WITH TOMOSYNTHESIS AND CAD
TECHNIQUE: Bilateral digital diagnostic mammography and breast tomosynthesis
was performed. The images were evaluated with computer-aided
detection.

[R MLO synth-2D]
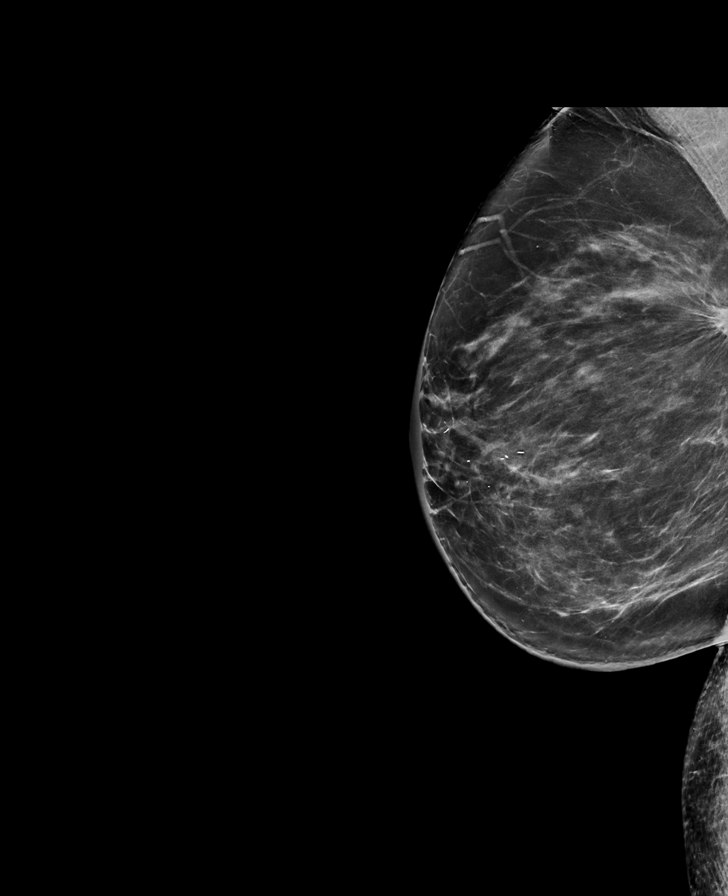

[L MLO synth-2D]
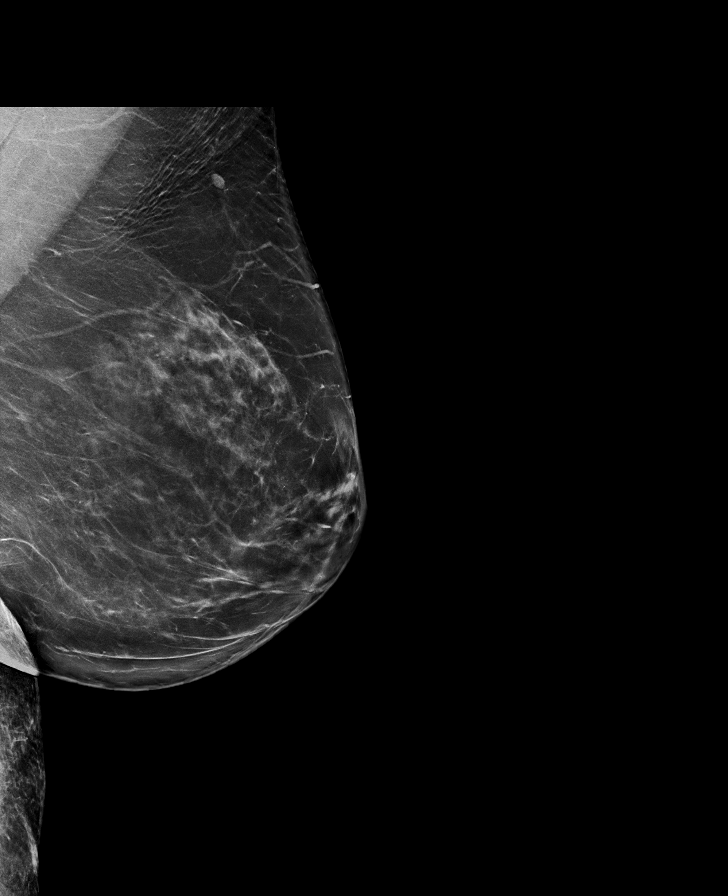

[L CC synth-2D]
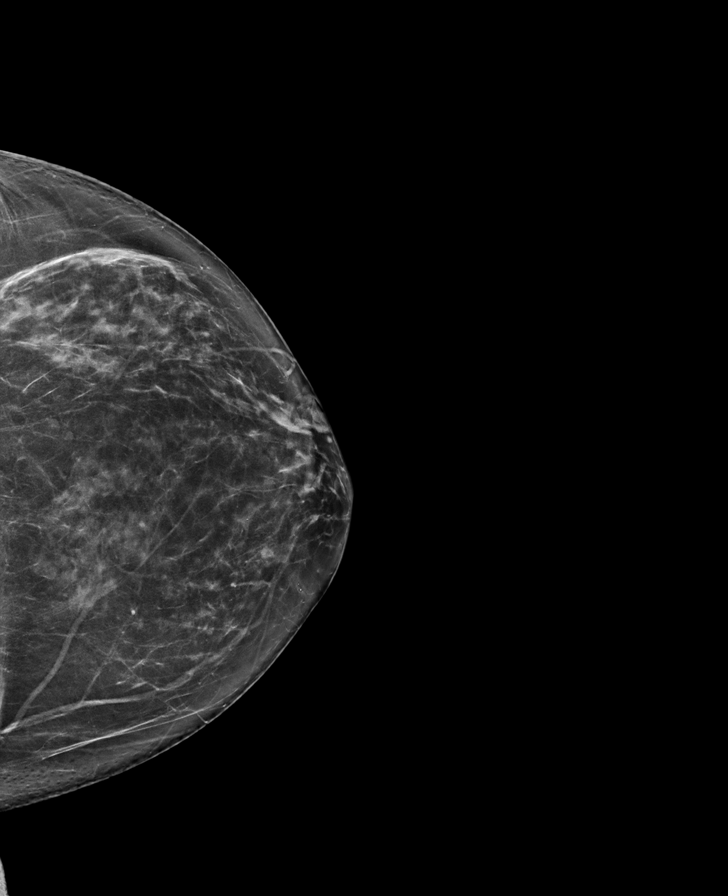

[R CC synth-2D]
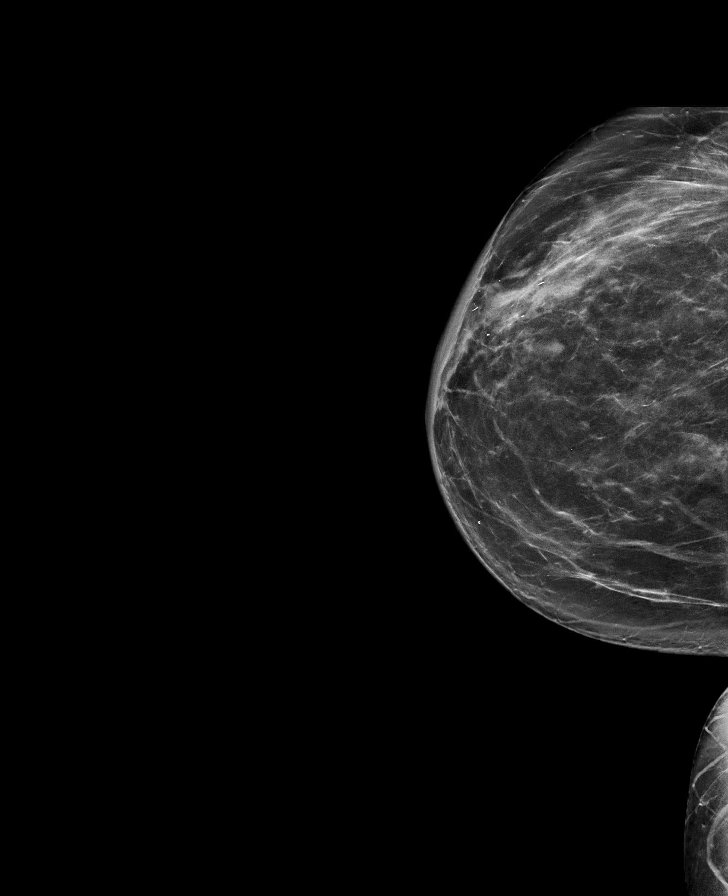

[R CC tomo · tomo slice 41/80.0]
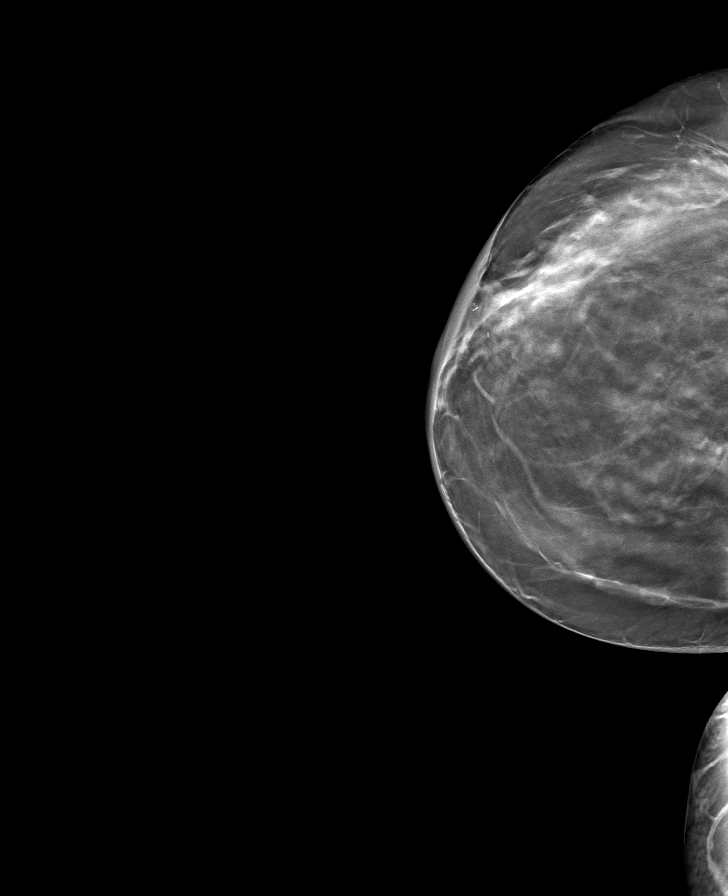

[L MLO tomo · tomo slice 45/88.0]
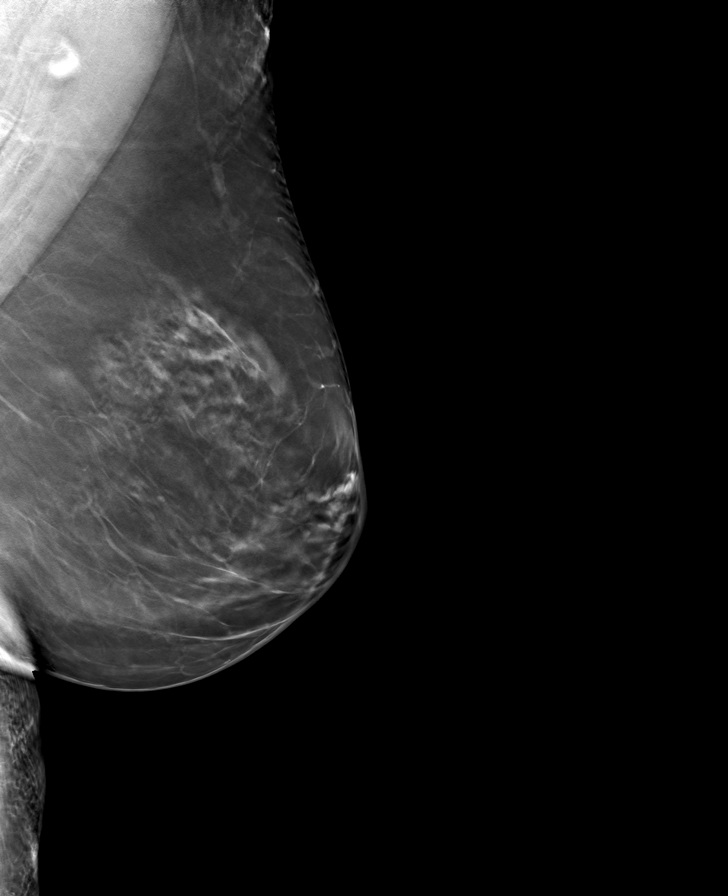

[L CC tomo · tomo slice 37/74.0]
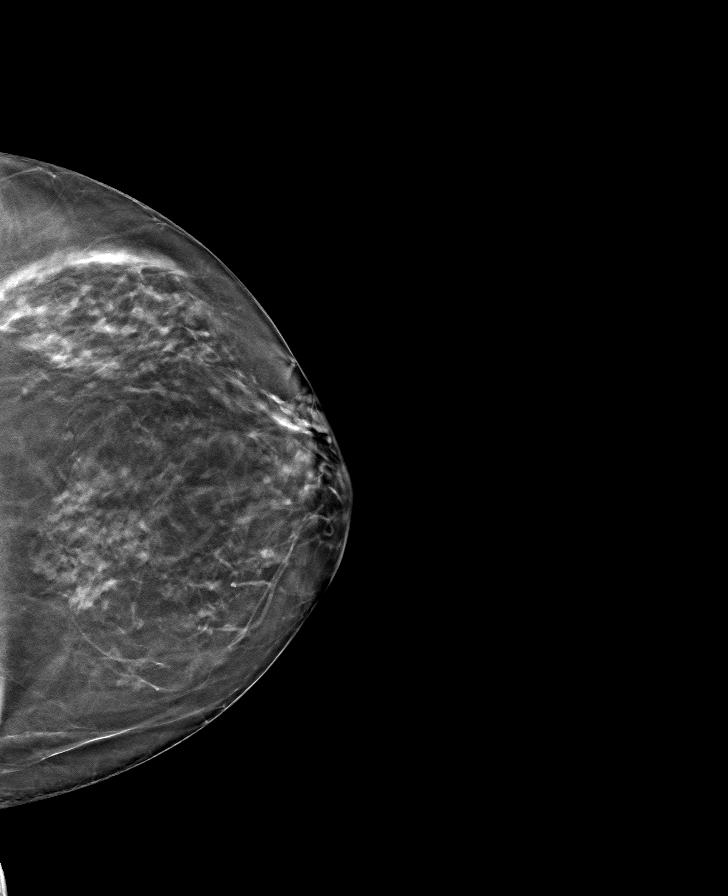

[R MLO tomo · tomo slice 42/83.0]
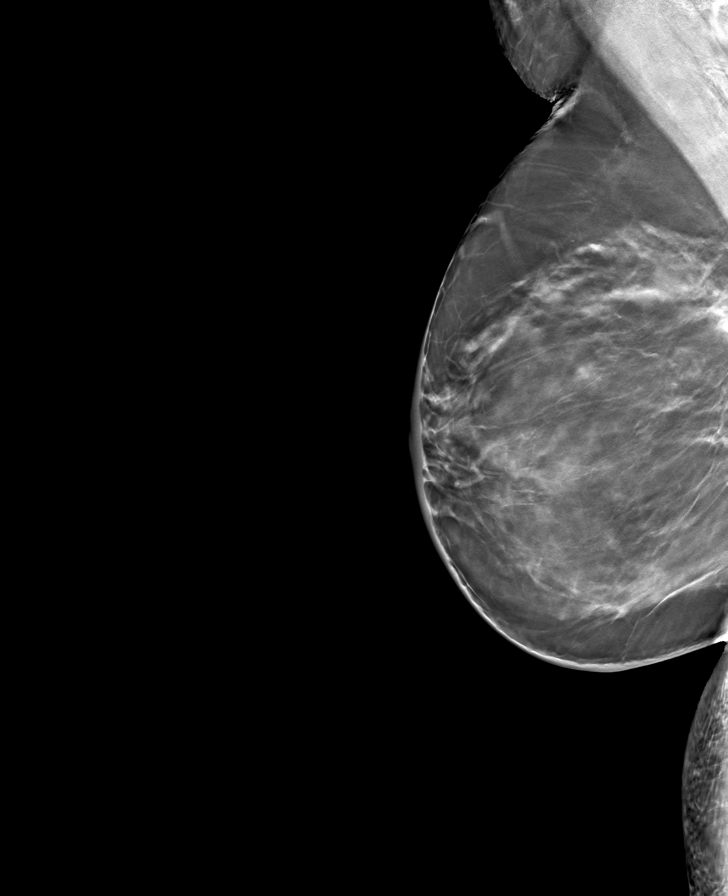

[8 of 24 positions shown; findings below may reference images not displayed]

ACR Breast Density Category b: There are scattered areas of
fibroglandular density.
FINDINGS: The small area of asymmetry in the posterior left breast is
unchanged from the most recent prior exam, consistent with a focal
area of fibroglandular tissue.

Post lumpectomy changes on right, in the posterolateral breast, are
stable.

There are no masses, areas of nonsurgical architectural distortion
or new areas of asymmetry. No suspicious calcifications.
IMPRESSION: 1. Probably benign left breast asymmetry, stable for 1 year. No new
abnormalities.
2. Benign post lumpectomy changes on the right.

RECOMMENDATION:
1. Diagnostic mammography in 1 year to provide 2 years of stability
for the probably benign area of asymmetry in the left breast.

I have discussed the findings and recommendations with the patient.
If applicable, a reminder letter will be sent to the patient
regarding the next appointment.

BI-RADS CATEGORY  3: Probably benign.

## 2023-06-16 MED ORDER — REPATHA SURECLICK 140 MG/ML ~~LOC~~ SOAJ: 140.0000 mg | SUBCUTANEOUS | 3 refills | Status: DC

## 2023-06-16 NOTE — Telephone Encounter (Signed)
Called patient and informed her of the following from Dr. Okey Dupre.  My understanding is that the Repatha Pushtronex system for injection every month is being discontinued.  I recommend that we transition [name] to the [facility] system with 140 mg subcutaneous every 2 weeks.  If she needs help with learning how to use the system, our pharmacy team should be able to assist her.   Leah Kendall, MD [facility]     Patient verbalizes understanding. Prescription sent to preferred pharmacy.

## 2023-06-16 NOTE — Telephone Encounter (Signed)
Pt c/o medication issue:  1. Name of Medication: Repatha Click  2. How are you currently taking this medication (dosage and times per day)?   3. Are you having a reaction (difficulty breathing--STAT)?   4. What is your medication issue? All the pharmacies aroun in the surrounding area, is out of the Repatha. She wants to know what does she need to do? Her next sot is due on 06-20-23

## 2023-06-16 NOTE — Telephone Encounter (Signed)
My understanding is that the Repatha Pushtronex system for injection every month is being discontinued.  I recommend that we transition [name] to the [facility] system with 140 mg subcutaneous every 2 weeks.  If she needs help with learning how to use the system, our pharmacy team should be able to assist her.  Leah Kendall, MD [facility]

## 2023-06-17 NOTE — Progress Notes (Signed)
[name]    [id number]    [birth year]  Primary Care Physician:Dugal, Wyatt Mage, FNP  Referring Physician: Mort Sawyers, FNP [address] [place],  [place] [address]   Chief complaint:   Chief Complaint  Patient presents with   pancreatic insufficiency    Allergic to Creon   back pain    Sharp Back pain starting in right lower area and radiating around to RLQ of abd    Nausea    Intermittent nausea when eating   HPI:  67 year old very pleasant female here for follow-up visit for abnormal CT of pancreas and pancreatic insufficiency Last seen on 01-24-23  Today, she complains of nausea which she tends to experience shortly after eating. Despite that. She states that her weight has been stable. She reports that she often has a BM shortly after eating, as a result, she typically has a  BM 3x daily. She also reports taking Miralax when she is unable to have a BM which tends to be once a week. She repots drinking sweet tea but denies any alcohol or soda intake.  She states that she previously took creon for few weeks but then she experienced a rash and itching in her perineal area. She tried OTC medication without much relief. She then stopped creon which caused the rash to resolve. We also reviewed her pancreatic CT scan and discussed the results.   She also complains of intermittent sharp back pain that radiates to the abdominal region that occurs when she bends or twists in a certain way.   Patient denies any  blood in stool, black stool, vomiting, bloating, unintentional weight loss, reflux, dysphagia.   GI Hx: MR Abdomen MRCP 03-24-23 -Diffuse fatty replacement of the pancreas, consistent with pancreatic insufficiency. -No acute findings.  No evidence of pancreatitis or pancreatic mass.  Right upper quadrant abdominal ultrasound 11/04/2022 Probable sludge in the gallbladder. No evidence of acute cholecystitis.  CT abdomen pelvis with contrast  10/22/2022 1. No acute abnormality. No evidence of bowel obstruction or acute bowel inflammation. Mild sigmoid diverticulosis, with no evidence of acute diverticulitis. Normal appendix. 2. Chronic fatty replacement of much of the pancreas. Consider pancreatic insufficiency. 3.  Aortic Atherosclerosis   Fecal elastase 2/22 2024: 224, normal Normal amylase and lipase October 22, 2022  Colonoscopy March 06, 2022 - One 1 mm polyp in the transverse colon, removed with a cold biopsy forceps. Resected and retrieved. - Diverticulosis in the sigmoid colon. - Non-bleeding external and internal hemorrhoids.   Current Outpatient Medications:    amLODipine (NORVASC) 5 MG tablet, Take 1 tablet (5 mg total) by mouth daily., Disp: 90 tablet, Rfl: 3   azelastine (ASTELIN) 0.1 % nasal spray, SPRAY 1-2 SPRAYS IN EACH NOSTRIL TWICE DAILY., Disp: , Rfl:    DULoxetine (CYMBALTA) 30 MG capsule, Take 1 capsule (30 mg total) by mouth daily. (Patient taking differently: Take 60 mg by mouth daily.), Disp: 90 capsule, Rfl: 0   Evolocumab (REPATHA SURECLICK) 140 MG/ML SOAJ, Inject 140 mg into the skin every 14 (fourteen) days., Disp: 6 mL, Rfl: 3   fluticasone (FLONASE) 50 MCG/ACT nasal spray, SPRAY 2 SPRAYS INTO EACH NOSTRIL EVERY DAY, Disp: 48 mL, Rfl: 2   glucosamine-chondroitin 500-400 MG tablet, Take 1 tablet by mouth., Disp: , Rfl:    ibuprofen (ADVIL) 800 MG tablet, Take 1 tablet (800 mg total) by mouth every 8 (eight) hours as needed., Disp: 90 tablet, Rfl:  0   Multiple Vitamin (MULTIVITAMIN) tablet, Take 1 tablet by mouth daily., Disp: , Rfl:    omeprazole (PRILOSEC) 20 MG capsule, TAKE 1 CAPSULE BY MOUTH EVERY DAY, Disp: 90 capsule, Rfl: 0    Allergies as of 06/19/2023 - Review Complete 06/19/2023  Allergen Reaction Noted   Ezetimibe Other (See Comments) 01/11/2022   Hydroxyzine Other (See Comments) 04/22/2023   Lisinopril Other (See Comments) 06/18/2022   Statins Other (See Comments) 05/15/2015    Creon [pancrelipase (lip-prot-amyl)] Other (See Comments) 04/08/2023   Losartan Other (See Comments) 01/11/2022    Past Medical History:  Diagnosis Date   Anemia    Anxiety    Blood transfusion without reported diagnosis    Breast cancer (HCC) 02/27/2007   Right   COVID-19 08/30/2020   GERD (gastroesophageal reflux disease)    Hypercholesterolemia    Hypertension    Migraine    Hx of in past   Personal history of radiation therapy 2008   Right   Wears dentures    partial upper and lower    Past Surgical History:  Procedure Laterality Date   ABDOMINAL HYSTERECTOMY     BREAST BIOPSY Right 02/27/2007   BREAST LUMPECTOMY Right 04/02/2007   BREAST SURGERY  01/14/2013   breast reduction left side   BROW LIFT Bilateral 06/17/2017   Procedure: BLEPHAROPLASTY UPPER EYELID WITH EXCESS SKIN;  Surgeon: Imagene Riches, MD;  [facility];  Service: Ophthalmology;  Laterality: Bilateral;   BROW LIFT Bilateral 11/30/2021   Procedure: BROW PTOSIS REPAIR BILATERAL;  Surgeon: Imagene Riches, MD;  Location: [facility];  Service: Ophthalmology;  Laterality: Bilateral;   CESAREAN SECTION     two times   COLONOSCOPY     FOOT SURGERY Right    PTOSIS REPAIR Bilateral 06/17/2017   Procedure: PTOSIS REPAIR RESECT EX;  Surgeon: Imagene Riches, MD;  Location: [facility];  Service: Ophthalmology;  Laterality: Bilateral;   REDUCTION MAMMAPLASTY Left    SHOULDER ARTHROSCOPY WITH ROTATOR CUFF REPAIR AND OPEN BICEPS TENODESIS Right 02/01/2019   Procedure: RIGHT SHOULDER ARTHROSCOPY, DEBRIDEMENT, BICEPS TENODESIS, MINI ROTATOR CUFF TEAR REPAIR;  Surgeon: Cammy Copa, MD;  Location:  SURGERY CENTER;  Service: Orthopedics;  Laterality: Right;    Family History  Problem Relation Age of Onset   Thyroid disease Mother    Hypertension Mother    Hyperlipidemia Mother    Alzheimer's disease Mother    Heart failure Mother    Esophageal cancer Father     Hyperlipidemia Father    Heart murmur Father    Colon polyps Father    COPD Father    Aneurysm Father    Throat cancer Father    Diabetes Sister    Breast cancer Sister    Irritable bowel syndrome Sister    Breast cancer Sister    Ulcerative colitis Sister    Rheum arthritis Sister    Alzheimer's disease Maternal Aunt    Alzheimer's disease Maternal Uncle    Diabetes Paternal Uncle    Alzheimer's disease Maternal Grandmother    Diabetes Paternal Grandmother    Breast cancer Cousin    Breast cancer Cousin    Colon cancer Neg Hx    Stomach cancer Neg Hx    Rectal cancer Neg Hx     Social History   Socioeconomic History   Marital status: Married    Spouse name: Onalee Hua   Number of children: 2   Years of education: high school  Highest education level: Not on file  Occupational History    Employer: retired  Tobacco Use   Smoking status: Never   Smokeless tobacco: Never  Vaping Use   Vaping status: Never Used  Substance and Sexual Activity   Alcohol use: Not Currently   Drug use: No   Sexual activity: Yes    Partners: Male    Birth control/protection: Surgical  Other Topics Concern   Not on file  Social History Narrative   01/25/20   From: the area   Living: with husband, Onalee Hua (1980) and her mother   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      Family: 2 daughters - Asher Muir and Annabelle Harman - 3 grandchildren      Enjoys: boating, walking, beach, fishing, outdoor activity      Exercise: keeps busy moving around the house, irregular   Diet: chicken, some read meat, veggies, salads - trying to limit sugar      Safety   Seat belts: Yes    Guns: Yes  and secure   Safe in relationships: Yes    Social Determinants of Health   Financial Resource Strain: Low Risk  (10/04/2022)   Overall Financial Resource Strain (CARDIA)    Difficulty of Paying Living Expenses: Not hard at all  Food Insecurity: No Food Insecurity (10/04/2022)    Hunger Vital Sign    Worried About Running Out of Food in the Last Year: Never true    Ran Out of Food in the Last Year: Never true  Transportation Needs: No Transportation Needs (10/04/2022)   PRAPARE - Administrator, Civil Service (Medical): No    Lack of Transportation (Non-Medical): No  Physical Activity: Sufficiently Active (10/04/2022)   Exercise Vital Sign    Days of Exercise per Week: 5 days    Minutes of Exercise per Session: 60 min  Stress: No Stress Concern Present (10/04/2022)   [employer] of Occupational Health - Occupational Stress Questionnaire    Feeling of Stress : Not at all  Social Connections: Socially Integrated (10/04/2022)   Social Connection and Isolation Panel [NHANES]    Frequency of Communication with Friends and Family: More than three times a week    Frequency of Social Gatherings with Friends and Family: More than three times a week    Attends Religious Services: More than 4 times per year    Active Member of [employer] or Organizations: Yes    Attends Banker Meetings: 1 to 4 times per year    Marital Status: Married  Catering manager Violence: Not At Risk (10/04/2022)   Humiliation, Afraid, Rape, and Kick questionnaire    Fear of Current or Ex-Partner: No    Emotionally Abused: No    Physically Abused: No    Sexually Abused: No    Review of systems: Review of Systems  Constitutional:  Negative for unexpected weight change.  HENT:  Negative for trouble swallowing.   Gastrointestinal:  Positive for constipation, diarrhea and nausea. Negative for abdominal distention, abdominal pain, anal bleeding, blood in stool, rectal pain and vomiting.  Musculoskeletal:  Positive for back pain.      Physical Exam: Vitals:   06/19/23 0930  BP: 110/70  Pulse: 96    Body mass index is 29.45 kg/m. General: well-appearing   Eyes: sclera anicteric, no redness ENT: oral mucosa moist without lesions, no cervical or supraclavicular  lymphadenopathy CV: RRR, no JVD, no peripheral edema Resp:  clear to auscultation bilaterally, normal RR and effort noted GI: soft, no tenderness, with active bowel sounds. No guarding or palpable organomegaly noted. Skin; warm and dry, no rash or jaundice noted Neuro: awake, alert and oriented x 3. Normal gross motor function and fluent speech   Data Reviewed:  Reviewed labs, radiology imaging, old records and pertinent past GI work up   Assessment and Plan/Recommendations:  67 year old very pleasant female with pancreatic insufficiency secondary to chronic fatty replacement of pancreas unclear etiology MRCP negative for any neoplastic lesion.  She has history of cholelithiasis but no cholecystitis, continue to monitor  Zenpep 60000 units samples - one with large meals  Advised patient to use over-the-counter digestive enzymes 3 times daily with meals, if she does not tolerate Zenpep  IBS constipation and diarrhea -continue Miralax once weekly  -Advised to avoid/decrease sugary drinks, artifical sweeteners, and sweets   Return in 1 year or sooner if needed  This visit required >40 minutes of patient care (this includes precharting, chart review, review of results, face-to-face time used for counseling as well as treatment plan and follow-up. The patient was provided an opportunity to ask questions and all were answered. The patient agreed with the plan and demonstrated an understanding of the instructions.  Iona Beard , MD    CC: Mort Sawyers, FNP   Ladona Mow Hewitt Shorts as a scribe for Marsa Aris, MD.,have documented all relevant documentation on the behalf of Marsa Aris, MD,as directed by  Marsa Aris, MD while in the presence of Marsa Aris, MD.   I, Marsa Aris, MD, have reviewed all documentation for this visit. The documentation on 06/19/23 for the exam, diagnosis, procedures, and orders are all accurate and complete.

## 2023-06-19 ENCOUNTER — Ambulatory Visit
Admission: RE | Admit: 2023-06-19 | Discharge: 2023-06-19 | Disposition: A | Discharge status: Home / Self Care | Payer: PPO | Source: Ambulatory Visit | Attending: Family | Admitting: Family

## 2023-06-19 ENCOUNTER — Ambulatory Visit: Payer: PPO | Admitting: Gastroenterology

## 2023-06-19 ENCOUNTER — Encounter: Payer: Self-pay | Admitting: Gastroenterology

## 2023-06-19 VITALS — BP 110/70 | HR 96 | Ht 64.5 in | Wt 174.2 lb

## 2023-06-19 DIAGNOSIS — K582 Mixed irritable bowel syndrome: Secondary | ICD-10-CM | POA: Diagnosis not present

## 2023-06-19 DIAGNOSIS — N958 Other specified menopausal and perimenopausal disorders: Secondary | ICD-10-CM | POA: Diagnosis not present

## 2023-06-19 DIAGNOSIS — E349 Endocrine disorder, unspecified: Secondary | ICD-10-CM | POA: Diagnosis not present

## 2023-06-19 DIAGNOSIS — K8689 Other specified diseases of pancreas: Secondary | ICD-10-CM

## 2023-06-19 DIAGNOSIS — Z780 Asymptomatic menopausal state: Secondary | ICD-10-CM

## 2023-06-19 NOTE — Patient Instructions (Signed)
Use OTC Digestive Enzymes Three times a day  We have given you ZenPep Samples 60,000 units today, let us know how they work for you and we can send you in a prescription  Follow up in 1 year   _______________________________________________________  If your blood pressure at your visit was 140/90 or greater, please contact your primary care physician to follow up on this.  _______________________________________________________  If you are age 67 or older, your body mass index should be between 23-30. Your Body mass index is 29.45 kg/m. If this is out of the aforementioned range listed, please consider follow up with your Primary Care Provider.  If you are age 30 or younger, your body mass index should be between 19-25. Your Body mass index is 29.45 kg/m. If this is out of the aformentioned range listed, please consider follow up with your Primary Care Provider.   ________________________________________________________  The [facility] providers would like to encourage you to use [facility] to communicate with providers for non-urgent requests or questions.  Due to long hold times on the telephone, sending your provider a message by [facility] may be a faster and more efficient way to get a response.  Please allow 48 business hours for a response.  Please remember that this is for non-urgent requests.  _______________________________________________________   I appreciate the  opportunity to care for you  Thank You   Marsa Aris , MD

## 2023-06-24 DIAGNOSIS — I83891 Varicose veins of right lower extremities with other complications: Secondary | ICD-10-CM | POA: Diagnosis not present

## 2023-06-24 DIAGNOSIS — I872 Venous insufficiency (chronic) (peripheral): Secondary | ICD-10-CM | POA: Diagnosis not present

## 2023-06-30 DIAGNOSIS — I83812 Varicose veins of left lower extremities with pain: Secondary | ICD-10-CM | POA: Diagnosis not present

## 2023-06-30 DIAGNOSIS — I83892 Varicose veins of left lower extremities with other complications: Secondary | ICD-10-CM | POA: Diagnosis not present

## 2023-07-01 ENCOUNTER — Encounter: Payer: Self-pay | Admitting: Gastroenterology

## 2023-07-01 NOTE — Progress Notes (Unsigned)
Leah Gaulin T. Hayven [name], MD, CAQ Sports Medicine [facility] [address] [place], [address]  Phone: [phone]  FAX: [phone]  [name] - 67 y.o. female  MRN [id number]  Date of Birth: Dec 14, 1955  Date: 07/02/2023  PCP: Mort Sawyers, FNP  Referral: Mort Sawyers, FNP  Leah chief complaint on file.  Subjective:   [name] is a 67 y.o. very pleasant female patient with There is Leah height or weight on file to calculate BMI. who presents with the following:  [name] presents with some ongoing mid to low back pain.    Review of Systems is noted in the HPI, as appropriate  Objective:   There were Leah vitals taken for this visit.  GEN: Leah acute distress; alert,appropriate. PULM: Breathing comfortably in Leah respiratory distress PSYCH: Normally interactive.   Laboratory and Imaging Data:  Assessment and Plan:   ***

## 2023-07-02 ENCOUNTER — Encounter: Payer: Self-pay | Admitting: Family Medicine

## 2023-07-02 ENCOUNTER — Ambulatory Visit: Payer: PPO | Admitting: Family Medicine

## 2023-07-02 VITALS — BP 128/80 | HR 105 | Temp 98.3°F | Ht 64.75 in | Wt 177.0 lb

## 2023-07-02 DIAGNOSIS — M545 Low back pain, unspecified: Secondary | ICD-10-CM

## 2023-07-02 LAB — POC URINALSYSI DIPSTICK (AUTOMATED)
Bilirubin, UA: NEGATIVE
Glucose, UA: NEGATIVE
Ketones, UA: NEGATIVE
Leukocytes, UA: NEGATIVE
Nitrite, UA: NEGATIVE
Protein, UA: NEGATIVE
Spec Grav, UA: 1.01 (ref 1.010–1.025)
Urobilinogen, UA: 0.2 U/dL
pH, UA: 7.5 (ref 5.0–8.0)

## 2023-07-02 MED ORDER — PREDNISONE 20 MG PO TABS: ORAL_TABLET | ORAL | 0 refills | Status: DC

## 2023-07-10 ENCOUNTER — Encounter: Payer: PPO | Admitting: Pharmacist

## 2023-07-20 ENCOUNTER — Other Ambulatory Visit: Payer: Self-pay | Admitting: Family

## 2023-07-20 DIAGNOSIS — F411 Generalized anxiety disorder: Secondary | ICD-10-CM

## 2023-07-21 ENCOUNTER — Encounter: Payer: Self-pay | Admitting: Family

## 2023-07-22 ENCOUNTER — Encounter: Payer: Self-pay | Admitting: Family

## 2023-07-24 ENCOUNTER — Other Ambulatory Visit: Payer: Self-pay | Admitting: Family

## 2023-07-24 DIAGNOSIS — M546 Pain in thoracic spine: Secondary | ICD-10-CM | POA: Diagnosis not present

## 2023-07-24 DIAGNOSIS — F411 Generalized anxiety disorder: Secondary | ICD-10-CM

## 2023-07-24 DIAGNOSIS — M542 Cervicalgia: Secondary | ICD-10-CM | POA: Diagnosis not present

## 2023-07-24 DIAGNOSIS — M9901 Segmental and somatic dysfunction of cervical region: Secondary | ICD-10-CM | POA: Diagnosis not present

## 2023-07-24 DIAGNOSIS — M9902 Segmental and somatic dysfunction of thoracic region: Secondary | ICD-10-CM | POA: Diagnosis not present

## 2023-07-24 NOTE — Telephone Encounter (Signed)
Medication has also been removed from medication list.

## 2023-07-24 NOTE — Telephone Encounter (Signed)
Spoke with pharmacy and cancelled rx

## 2023-07-24 NOTE — Addendum Note (Signed)
Addended by: Wendie Simmer B on: 07/24/2023 02:45 PM   Modules accepted: Orders

## 2023-07-24 NOTE — Telephone Encounter (Signed)
Can we please call pharmacy and cancel the refill on buspirone and let them know she is no longer taking this med? I actually approved it and this is the second request they have sent me even though I denied the first.

## 2023-07-25 ENCOUNTER — Other Ambulatory Visit: Payer: Self-pay | Admitting: Family

## 2023-07-25 DIAGNOSIS — F411 Generalized anxiety disorder: Secondary | ICD-10-CM

## 2023-07-28 DIAGNOSIS — M9902 Segmental and somatic dysfunction of thoracic region: Secondary | ICD-10-CM | POA: Diagnosis not present

## 2023-07-28 DIAGNOSIS — M542 Cervicalgia: Secondary | ICD-10-CM | POA: Diagnosis not present

## 2023-07-28 DIAGNOSIS — M546 Pain in thoracic spine: Secondary | ICD-10-CM | POA: Diagnosis not present

## 2023-07-28 DIAGNOSIS — M9901 Segmental and somatic dysfunction of cervical region: Secondary | ICD-10-CM | POA: Diagnosis not present

## 2023-07-30 DIAGNOSIS — M546 Pain in thoracic spine: Secondary | ICD-10-CM | POA: Diagnosis not present

## 2023-07-30 DIAGNOSIS — M9901 Segmental and somatic dysfunction of cervical region: Secondary | ICD-10-CM | POA: Diagnosis not present

## 2023-07-30 DIAGNOSIS — M542 Cervicalgia: Secondary | ICD-10-CM | POA: Diagnosis not present

## 2023-07-30 DIAGNOSIS — M9902 Segmental and somatic dysfunction of thoracic region: Secondary | ICD-10-CM | POA: Diagnosis not present

## 2023-07-31 ENCOUNTER — Ambulatory Visit (INDEPENDENT_AMBULATORY_CARE_PROVIDER_SITE_OTHER): Payer: PPO | Admitting: Family

## 2023-07-31 ENCOUNTER — Encounter: Payer: Self-pay | Admitting: Family

## 2023-07-31 VITALS — BP 122/82 | HR 80 | Temp 98.0°F | Ht 64.75 in | Wt 175.6 lb

## 2023-07-31 DIAGNOSIS — R635 Abnormal weight gain: Secondary | ICD-10-CM | POA: Insufficient documentation

## 2023-07-31 DIAGNOSIS — I10 Essential (primary) hypertension: Secondary | ICD-10-CM | POA: Diagnosis not present

## 2023-07-31 DIAGNOSIS — J0120 Acute ethmoidal sinusitis, unspecified: Secondary | ICD-10-CM | POA: Diagnosis not present

## 2023-07-31 DIAGNOSIS — Z6828 Body mass index (BMI) 28.0-28.9, adult: Secondary | ICD-10-CM | POA: Diagnosis not present

## 2023-07-31 DIAGNOSIS — R7303 Prediabetes: Secondary | ICD-10-CM

## 2023-07-31 DIAGNOSIS — Z20822 Contact with and (suspected) exposure to covid-19: Secondary | ICD-10-CM

## 2023-07-31 DIAGNOSIS — E663 Overweight: Secondary | ICD-10-CM | POA: Diagnosis not present

## 2023-07-31 DIAGNOSIS — Z13220 Encounter for screening for lipoid disorders: Secondary | ICD-10-CM | POA: Diagnosis not present

## 2023-07-31 LAB — T4, FREE: Free T4: 0.79 ng/dL (ref 0.60–1.60)

## 2023-07-31 LAB — LIPID PANEL
Cholesterol: 160 mg/dL (ref 0–200)
HDL: 56.5 mg/dL (ref >39.00)
LDL Cholesterol: 67 mg/dL (ref 0–99)
NonHDL: 103.31
Total CHOL/HDL Ratio: 3
Triglycerides: 183 mg/dL — ABNORMAL HIGH (ref 0.0–149.0)
VLDL: 36.6 mg/dL (ref 0.0–40.0)

## 2023-07-31 LAB — HEMOGLOBIN A1C: Hgb A1c MFr Bld: 6.6 % — ABNORMAL HIGH (ref 4.6–6.5)

## 2023-07-31 LAB — TSH: TSH: 1.77 u[IU]/mL (ref 0.35–5.50)

## 2023-07-31 LAB — POC COVID19 BINAXNOW: SARS Coronavirus 2 Ag: NEGATIVE

## 2023-07-31 MED ORDER — CEFDINIR 300 MG PO CAPS
300.0000 mg | ORAL_CAPSULE | Freq: Two times a day (BID) | ORAL | 0 refills | Status: AC
Start: 2023-07-31 — End: 2023-08-07

## 2023-07-31 NOTE — Assessment & Plan Note (Signed)
Pt advised of the following: Work on a diabetic diet, try to incorporate exercise at least 20-30 a day for 3 days a week or more.  Repeat A1c today pending results.

## 2023-07-31 NOTE — Addendum Note (Signed)
Addended by: Jaynee Eagles C on: 07/31/2023 11:20 AM   Modules accepted: Orders

## 2023-07-31 NOTE — Progress Notes (Signed)
Established Patient Office Visit  Subjective:   Patient ID: [name], female    DOB: [birth year]  Age: 67 y.o. MRN: [id number]  CC:  Chief Complaint  Patient presents with   Acute Visit    Reports sinus pressure/congestion, fatigue, PND and ear pain. Symptoms have been ongoing for about a week. Has been taking Mucinex Sinus, flonase and Astelin nasal sprays.    HPI: [name] is a 67 y.o. female presenting on 07/31/2023 for Acute Visit (Reports sinus pressure/congestion, fatigue, PND and ear pain. Symptoms have been ongoing for about a week. Has been taking Mucinex Sinus, flonase and Astelin nasal sprays.)  Five days ago started with a frontal headache, which progressed to nasal congestion, ear pain and post nasal drip. Also c/o sinus pressure, lethargy and  No fever but did feel hot one night. No cough or chest congestion. No sore throat anymore, was throat at first.   Taking otc mucinex sinus, flonase and astelin nose spray.   Exercise: very active, up and down stairs.  Diet: trying to increase fruit and vegetables nad tyring to make healthier decisions and only eats rare amount of red meats and only chicken. Still at BMI 29 and she finds this frustrating. She has been going to light therapy for her weight as well with some mild improvement in 'size' but not weight.  She is inquiring about the GLP 1 for weight loss.   Wt Readings from Last 3 Encounters:  07/31/23 175 lb 9.6 oz (79.7 kg)  07/02/23 177 lb (80.3 kg)  06/19/23 174 lb 4 oz (79 kg)       ROS: Negative unless specifically indicated above in HPI.   Relevant past medical history reviewed and updated as indicated.   Allergies and medications reviewed and updated.   Current Outpatient Medications:    cefdinir (OMNICEF) 300 MG capsule, Take 1 capsule (300 mg total) by mouth 2 (two) times daily for 7 days., Disp: 14 capsule, Rfl: 0   amLODipine (NORVASC) 5 MG tablet, Take 1 tablet (5 mg total) by mouth  daily., Disp: 90 tablet, Rfl: 3   azelastine (ASTELIN) 0.1 % nasal spray, SPRAY 1-2 SPRAYS IN EACH NOSTRIL TWICE DAILY., Disp: , Rfl:    DULoxetine (CYMBALTA) 60 MG capsule, Take 60 mg by mouth daily., Disp: , Rfl:    Evolocumab (REPATHA SURECLICK) 140 MG/ML SOAJ, Inject 140 mg into the skin every 14 (fourteen) days., Disp: 6 mL, Rfl: 3   fluticasone (FLONASE) 50 MCG/ACT nasal spray, SPRAY 2 SPRAYS INTO EACH NOSTRIL EVERY DAY, Disp: 48 mL, Rfl: 2   glucosamine-chondroitin 500-400 MG tablet, Take 1 tablet by mouth., Disp: , Rfl:    ibuprofen (ADVIL) 800 MG tablet, Take 1 tablet (800 mg total) by mouth every 8 (eight) hours as needed., Disp: 90 tablet, Rfl: 0   Multiple Vitamin (MULTIVITAMIN) tablet, Take 1 tablet by mouth daily., Disp: , Rfl:    omeprazole (PRILOSEC) 20 MG capsule, TAKE 1 CAPSULE BY MOUTH EVERY DAY, Disp: 90 capsule, Rfl: 0  Allergies  Allergen Reactions   Ezetimibe Other (See Comments)    Fatigue and constipation   Hydroxyzine Other (See Comments)    Drowsiness, excessive   Lisinopril Other (See Comments)    Increased fatigue, sleeping more than needed   Statins Other (See Comments)    Muscle aches   Creon [Pancrelipase (Lip-Prot-Amyl)] Other (See Comments)    itching   Losartan Other (See Comments)    Fatigue, malaise  Objective:   BP 122/82 (BP Location: Right Arm, Patient Position: Sitting, Cuff Size: Normal)   Pulse 80   Temp 98 F (36.7 C) (Temporal)   Ht 5' 4.75" (1.645 m)   Wt 175 lb 9.6 oz (79.7 kg)   SpO2 98%   BMI 29.45 kg/m    Physical Exam Constitutional:      General: She is not in acute distress.    Appearance: Normal appearance. She is normal weight. She is not ill-appearing, toxic-appearing or diaphoretic.  HENT:     Head: Normocephalic.     Right Ear: Tympanic membrane normal.     Left Ear: Tympanic membrane normal.     Nose:     Right Turbinates: Swollen.     Left Turbinates: Swollen.     Right Sinus: Maxillary sinus tenderness  present.     Left Sinus: Maxillary sinus tenderness present.     Mouth/Throat:     Mouth: Mucous membranes are dry.     Pharynx: No oropharyngeal exudate or posterior oropharyngeal erythema.  Eyes:     Extraocular Movements: Extraocular movements intact.     Pupils: Pupils are equal, round, and reactive to light.  Cardiovascular:     Rate and Rhythm: Normal rate and regular rhythm.     Pulses: Normal pulses.     Heart sounds: Normal heart sounds.  Pulmonary:     Effort: Pulmonary effort is normal.     Breath sounds: Normal breath sounds.  Musculoskeletal:     Cervical back: Normal range of motion.  Neurological:     General: No focal deficit present.     Mental Status: She is alert and oriented to person, place, and time. Mental status is at baseline.  Psychiatric:        Mood and Affect: Mood normal.        Behavior: Behavior normal.        Thought Content: Thought content normal.        Judgment: Judgment normal.     Assessment & Plan:  Suspected COVID-19 virus infection  Primary hypertension -     Lipid panel  Screening for lipoid disorders -     Lipid panel  Weight increasing -     TSH -     T4, free  Prediabetes Assessment & Plan: Pt advised of the following: Work on a diabetic diet, try to incorporate exercise at least 20-30 a day for 3 days a week or more.  Repeat A1c today pending results.   Orders: -     Hemoglobin A1c  Acute non-recurrent ethmoidal sinusitis Assessment & Plan: Pt to continue tylenol/ibuprofen prn sinus pain. Continue with humidifier prn and steam showers recommended as well. instructed If no symptom improvement in 48 hours please f/u Rx cefdinir 300 mg po bid x 7 days   Orders: -     Cefdinir; Take 1 capsule (300 mg total) by mouth 2 (two) times daily for 7 days.  Dispense: 14 capsule; Refill: 0  Overweight with body mass index (BMI) of 28 to 28.9 in adult Assessment & Plan: May consider GLP 1 pending results of A1C for more  direction  The beneficiary does not have any FDA labeled contraindications to the requested agent including pregnancy, lactation, h/o medullary thyroid cancer or multiple endocrine neoplasia type II.       Covid in office negative   Follow up plan: Return if symptoms worsen or fail to improve.  Mort Sawyers, FNP

## 2023-07-31 NOTE — Assessment & Plan Note (Signed)
Pt to continue tylenol/ibuprofen prn sinus pain. Continue with humidifier prn and steam showers recommended as well. instructed If no symptom improvement in 48 hours please f/u Rx cefdinir 300 mg po bid x 7 days

## 2023-07-31 NOTE — Assessment & Plan Note (Signed)
May consider GLP 1 pending results of A1C for more direction  The beneficiary does not have any FDA labeled contraindications to the requested agent including pregnancy, lactation, h/o medullary thyroid cancer or multiple endocrine neoplasia type II.

## 2023-08-03 ENCOUNTER — Encounter: Payer: Self-pay | Admitting: Family

## 2023-08-03 DIAGNOSIS — E119 Type 2 diabetes mellitus without complications: Secondary | ICD-10-CM

## 2023-08-04 DIAGNOSIS — M9902 Segmental and somatic dysfunction of thoracic region: Secondary | ICD-10-CM | POA: Diagnosis not present

## 2023-08-04 DIAGNOSIS — M542 Cervicalgia: Secondary | ICD-10-CM | POA: Diagnosis not present

## 2023-08-04 DIAGNOSIS — M546 Pain in thoracic spine: Secondary | ICD-10-CM | POA: Diagnosis not present

## 2023-08-04 DIAGNOSIS — M9901 Segmental and somatic dysfunction of cervical region: Secondary | ICD-10-CM | POA: Diagnosis not present

## 2023-08-04 MED ORDER — MOUNJARO 7.5 MG/0.5ML ~~LOC~~ SOAJ: 7.5000 mg | SUBCUTANEOUS | 0 refills | Status: DC

## 2023-08-04 MED ORDER — TIRZEPATIDE 5 MG/0.5ML ~~LOC~~ SOAJ
SUBCUTANEOUS | 2 refills | Status: DC
Start: 2023-08-25 — End: 2023-10-10

## 2023-08-04 MED ORDER — TIRZEPATIDE 2.5 MG/0.5ML ~~LOC~~ SOAJ: SUBCUTANEOUS | 0 refills | Status: DC

## 2023-08-07 ENCOUNTER — Other Ambulatory Visit (HOSPITAL_COMMUNITY): Payer: Self-pay

## 2023-08-07 ENCOUNTER — Telehealth: Payer: Self-pay

## 2023-08-07 NOTE — Telephone Encounter (Signed)
Pharmacy Patient Advocate Encounter   Received notification from Patient Advice Request messages that prior authorization for Mounjaro 2.5MG /0.5ML auto-injectors is required/requested.   Insurance verification completed.   The patient is insured through [facility] ADVANTAGE/RX ADVANCE .   Per test claim: APPROVED from 08/07/23 to 08/06/24. Ran test claim, Copay is $11.20. This test claim was processed through [facility]- copay amounts may vary at other pharmacies due to pharmacy/plan contracts, or as the patient moves through the different stages of their insurance plan.   Key: [id number] PA Case ID #: [id number]

## 2023-08-18 ENCOUNTER — Encounter: Payer: Self-pay | Admitting: Family

## 2023-08-20 ENCOUNTER — Encounter: Payer: PPO | Admitting: Pharmacist

## 2023-08-27 ENCOUNTER — Encounter: Payer: Self-pay | Admitting: Family

## 2023-08-30 ENCOUNTER — Other Ambulatory Visit: Payer: Self-pay | Admitting: Family

## 2023-08-30 DIAGNOSIS — K2960 Other gastritis without bleeding: Secondary | ICD-10-CM

## 2023-09-11 ENCOUNTER — Encounter: Payer: Self-pay | Admitting: Gastroenterology

## 2023-09-18 ENCOUNTER — Other Ambulatory Visit: Payer: Self-pay

## 2023-09-18 MED ORDER — ZENPEP 60000-189600 UNITS PO CPEP: 1.0000 | ORAL_CAPSULE | Freq: Three times a day (TID) | ORAL | 6 refills | Status: DC

## 2023-09-25 ENCOUNTER — Encounter: Payer: Self-pay | Admitting: Gastroenterology

## 2023-09-28 ENCOUNTER — Encounter: Payer: Self-pay | Admitting: Family

## 2023-09-29 ENCOUNTER — Encounter: Payer: Self-pay | Admitting: Gastroenterology

## 2023-09-30 ENCOUNTER — Ambulatory Visit (INDEPENDENT_AMBULATORY_CARE_PROVIDER_SITE_OTHER): Payer: PPO

## 2023-09-30 VITALS — BP 120/80 | Ht 64.75 in | Wt 167.0 lb

## 2023-09-30 DIAGNOSIS — Z0000 Encounter for general adult medical examination without abnormal findings: Secondary | ICD-10-CM

## 2023-09-30 NOTE — Progress Notes (Signed)
 Subjective:   [name] is a 68 y.o. female who presents for Medicare Annual (Subsequent) preventive examination.  Visit Complete: In person  Patient Medicare AWV questionnaire was completed by the patient on 09/30/23; I have confirmed that all information answered by patient is correct and no changes since this date.  Cardiac Risk Factors include: advanced age (>27men, >57 women);hypertension    Objective:    Today's Vitals   09/30/23 0939  BP: 120/80  Weight: 167 lb (75.8 kg)  Height: 5' 4.75 (1.645 m)  PainSc: 0-No pain   Body mass index is 28.01 kg/m.     09/30/2023   10:04 AM 10/04/2022    9:37 AM 08/30/2022    4:13 PM 06/11/2022   11:32 AM 03/15/2019    8:18 AM 02/01/2019    8:51 AM 01/28/2019   12:13 PM  Advanced Directives  Does Patient Have a Medical Advance Directive? No No No No No No No  Would patient like information on creating a medical advance directive?  No - Patient declined   No - Patient declined No - Patient declined     Current Medications (verified) Outpatient Encounter Medications as of 09/30/2023  Medication Sig   amLODipine  (NORVASC ) 5 MG tablet Take 1 tablet (5 mg total) by mouth daily.   azelastine (ASTELIN) 0.1 % nasal spray SPRAY 1-2 SPRAYS IN EACH NOSTRIL TWICE DAILY.   DULoxetine  (CYMBALTA ) 60 MG capsule Take 60 mg by mouth daily.   Evolocumab  (REPATHA  SURECLICK) 140 MG/ML SOAJ Inject 140 mg into the skin every 14 (fourteen) days.   fluticasone  (FLONASE ) 50 MCG/ACT nasal spray SPRAY 2 SPRAYS INTO EACH NOSTRIL EVERY DAY   glucosamine-chondroitin 500-400 MG tablet Take 1 tablet by mouth.   ibuprofen  (ADVIL ) 800 MG tablet Take 1 tablet (800 mg total) by mouth every 8 (eight) hours as needed.   Multiple Vitamin (MULTIVITAMIN) tablet Take 1 tablet by mouth daily.   omeprazole  (PRILOSEC) 20 MG capsule TAKE 1 CAPSULE BY MOUTH EVERY DAY   Pancrelipase , Lip-Prot-Amyl, (ZENPEP ) 60000-189600 units CPEP Take 1 capsule by mouth 3 (three) times daily  with meals.   tirzepatide  (MOUNJARO ) 2.5 MG/0.5ML Pen Inject 2.5 mg [place] once weekly for four weeks, then increase to 5.0 mg [place] weekly (new prescription)   tirzepatide  (MOUNJARO ) 5 MG/0.5ML Pen After completing four weeks of 2.5 mg qweek [place], start 5.0 mg weekly qweekly   tirzepatide  (MOUNJARO ) 7.5 MG/0.5ML Pen Inject 7.5 mg into the skin once a week.   No facility-administered encounter medications on file as of 09/30/2023.    Allergies (verified) Ezetimibe , Hydroxyzine , Lisinopril , Statins, Creon  [pancrelipase  (lip-prot-amyl)], and Losartan    History: Past Medical History:  Diagnosis Date   Anemia    Anxiety    Blood transfusion without reported diagnosis    Breast cancer (HCC) 02/27/2007   Right   COVID-19 08/30/2020   GERD (gastroesophageal reflux disease)    Hypercholesterolemia    Hypertension    Migraine    Hx of in past   Personal history of radiation therapy 2008   Right   Wears dentures    partial upper and lower   Past Surgical History:  Procedure Laterality Date   ABDOMINAL HYSTERECTOMY     BREAST BIOPSY Right 02/27/2007   BREAST LUMPECTOMY Right 04/02/2007   BREAST SURGERY  01/14/2013   breast reduction left side   BROW LIFT Bilateral 06/17/2017   Procedure: BLEPHAROPLASTY UPPER EYELID WITH EXCESS SKIN;  Surgeon: Ashley Greig HERO, MD;  Location: [facility];  Service: Ophthalmology;  Laterality: Bilateral;   BROW LIFT Bilateral 11/30/2021   Procedure: BROW PTOSIS REPAIR BILATERAL;  Surgeon: Ashley Greig HERO, MD;  Location: [facility];  Service: Ophthalmology;  Laterality: Bilateral;   CESAREAN SECTION     two times   COLONOSCOPY     FOOT SURGERY Right    PTOSIS REPAIR Bilateral 06/17/2017   Procedure: PTOSIS REPAIR RESECT EX;  Surgeon: Ashley Greig HERO, MD;  Location: [facility];  Service: Ophthalmology;  Laterality: Bilateral;   REDUCTION MAMMAPLASTY Left    SHOULDER ARTHROSCOPY WITH ROTATOR CUFF REPAIR AND OPEN BICEPS TENODESIS Right 02/01/2019    Procedure: RIGHT SHOULDER ARTHROSCOPY, DEBRIDEMENT, BICEPS TENODESIS, MINI ROTATOR CUFF TEAR REPAIR;  Surgeon: Addie Cordella Hamilton, MD;  Location: [facility];  Service: Orthopedics;  Laterality: Right;   Family History  Problem Relation Age of Onset   Thyroid  disease Mother    Hypertension Mother    Hyperlipidemia Mother    Alzheimer's disease Mother    Heart failure Mother    Esophageal cancer Father    Hyperlipidemia Father    Heart murmur Father    Colon polyps Father    COPD Father    Aneurysm Father    Throat cancer Father    Diabetes Sister    Breast cancer Sister    Irritable bowel syndrome Sister    Breast cancer Sister    Ulcerative colitis Sister    Rheum arthritis Sister    Alzheimer's disease Maternal Aunt    Alzheimer's disease Maternal Uncle    Diabetes Paternal Uncle    Alzheimer's disease Maternal Grandmother    Diabetes Paternal Grandmother    Breast cancer Cousin    Breast cancer Cousin    Colon cancer Neg Hx    Stomach cancer Neg Hx    Rectal cancer Neg Hx    Social History   Socioeconomic History   Marital status: Married    Spouse name: Alm   Number of children: 2   Years of education: high school   Highest education level: GED or equivalent  Occupational History    Employer: retired  Tobacco Use   Smoking status: Never   Smokeless tobacco: Never  Vaping Use   Vaping status: Never Used  Substance and Sexual Activity   Alcohol use: Not Currently   Drug use: No   Sexual activity: Yes    Partners: Male    Birth control/protection: Surgical  Other Topics Concern   Not on file  Social History Narrative   01/25/20   From: the area   Living: with husband, Alm (1980) and her mother   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      Family: 2 daughters - Warren and Lonell - 3 grandchildren      Enjoys: boating, walking, beach, fishing, outdoor activity      Exercise: keeps busy moving  around the house, irregular   Diet: chicken, some read meat, veggies, salads - trying to limit sugar      Safety   Seat belts: Yes    Guns: Yes  and secure   Safe in relationships: Yes    Social Drivers of Health   Financial Resource Strain: Low Risk  (09/30/2023)   Overall Financial Resource Strain (CARDIA)    Difficulty of Paying Living Expenses: Not hard at all  Food Insecurity: No Food Insecurity (09/30/2023)   Hunger Vital Sign    Worried About  Running Out of Food in the Last Year: Never true    Ran Out of Food in the Last Year: Never true  Transportation Needs: No Transportation Needs (09/30/2023)   PRAPARE - Administrator, Civil Service (Medical): No    Lack of Transportation (Non-Medical): No  Physical Activity: Sufficiently Active (09/30/2023)   Exercise Vital Sign    Days of Exercise per Week: 5 days    Minutes of Exercise per Session: 50 min  Stress: No Stress Concern Present (09/30/2023)   [employer] of Occupational Health - Occupational Stress Questionnaire    Feeling of Stress : Not at all  Social Connections: Socially Integrated (09/30/2023)   Social Connection and Isolation Panel [NHANES]    Frequency of Communication with Friends and Family: More than three times a week    Frequency of Social Gatherings with Friends and Family: Twice a week    Attends Religious Services: More than 4 times per year    Active Member of [employer] or Organizations: Yes    Attends Banker Meetings: 1 to 4 times per year    Marital Status: Married    Tobacco Counseling Counseling given: Not Answered  Clinical Intake:  Pre-visit preparation completed: Yes  Pain : No/denies pain Pain Score: 0-No pain   BMI - recorded: 28.01 Nutritional Status: BMI 25 -29 Overweight Nutritional Risks: None Diabetes: No  How often do you need to have someone help you when you read instructions, pamphlets, or other written materials from your doctor or pharmacy?: 1 -  Never  Interpreter Needed?: No  Comments: lives with husband Information entered by :: B.Elijan Googe,LPN  Activities of Daily Living    09/30/2023    8:42 AM 10/04/2022    9:39 AM  In your present state of health, do you have any difficulty performing the following activities:  Hearing? 0 0  Vision? 0 0  Difficulty concentrating or making decisions? 0 0  Walking or climbing stairs? 0 0  Dressing or bathing? 0 0  Doing errands, shopping? 0 0  Preparing Food and eating ? N N  Using the Toilet? N N  In the past six months, have you accidently leaked urine? N N  Do you have problems with loss of bowel control? N N  Managing your Medications? N N  Managing your Finances? N N  Housekeeping or managing your Housekeeping? N N    Patient Care Team: Corwin Antu, FNP as PCP - General (Family Medicine) End, Lonni, MD as PCP - Cardiology (Cardiology) Fate Morna SAILOR, [facility] (Inactive) as Pharmacist (Pharmacist) Myrna Adine Anes, MD as Consulting Physician (Ophthalmology)  Indicate any recent Medical Services you may have received from other than [facility] providers in the past year (date may be approximate).     Assessment:   This is a routine wellness examination for [name].  Hearing/Vision screen Hearing Screening - Comments:: Pt says her hearing is great Vision Screening - Comments:: Pt says her vision is good with glasses   Goals Addressed             This Visit's Progress    Patient Stated       I would like to lose more weight (20lbs) by walking and eating healthier.       Depression Screen    09/30/2023   10:01 AM 10/22/2022    2:35 PM 10/07/2022   12:33 PM 10/04/2022    9:35 AM 10/01/2021   10:33 AM 10/01/2021    9:55  AM 09/27/2020   10:03 AM  PHQ 2/9 Scores  PHQ - 2 Score 0 0 0 0 0 0 0  PHQ- 9 Score  0 0   0     Fall Risk    09/30/2023    9:57 AM 09/30/2023    8:42 AM 10/22/2022    2:34 PM 10/07/2022   12:33 PM 10/04/2022    9:39 AM  Fall Risk   Falls in  the past year? 0 0 0 0 0  Number falls in past yr:   0 0 0  Injury with Fall?   0 0 0  Risk for fall due to : No Fall Risks    Impaired vision  Follow up Falls prevention discussed;Education provided  Falls evaluation completed;Education provided Falls evaluation completed;Education provided Falls prevention discussed    MEDICARE RISK AT HOME: Medicare Risk at Home Any stairs in or around the home?: (Patient-Rptd) Yes If so, are there any without handrails?: (Patient-Rptd) No Home free of loose throw rugs in walkways, pet beds, electrical cords, etc?: (Patient-Rptd) Yes Adequate lighting in your home to reduce risk of falls?: (Patient-Rptd) Yes Life alert?: (Patient-Rptd) No Use of a cane, walker or w/c?: (Patient-Rptd) No Grab bars in the bathroom?: (Patient-Rptd) No Shower chair or bench in shower?: (Patient-Rptd) No Elevated toilet seat or a handicapped toilet?: (Patient-Rptd) No  TIMED UP AND GO:  Was the test performed?  Yes  Length of time to ambulate 10 feet: 10 sec Gait steady and fast without use of assistive device    Cognitive Function:        09/30/2023   10:05 AM 10/04/2022    9:40 AM  6CIT Screen  What Year? 0 points 0 points  What month? 0 points 0 points  What time? 0 points 0 points  Count back from 20 0 points 0 points  Months in reverse 0 points 0 points  Repeat phrase 2 points 0 points  Total Score 2 points 0 points    Immunizations Immunization History  Administered Date(s) Administered   Fluad Quad(high Dose 65+) 10/01/2021   Influenza,inj,Quad PF,6+ Mos 05/11/2014, 05/15/2015, 05/16/2016, 09/27/2020   PNEUMOCOCCAL CONJUGATE-20 10/01/2021   Tdap 08/31/2013    TDAP status: Up to date  Flu Vaccine status: Declined, Education has been provided regarding the importance of this vaccine but patient still declined. Advised may receive this vaccine at local pharmacy or Health Dept. Aware to provide a copy of the vaccination record if obtained from  local pharmacy or Health Dept. Verbalized acceptance and understanding.  Pneumococcal vaccine status: Up to date  Covid-19 vaccine status: Declined, Education has been provided regarding the importance of this vaccine but patient still declined. Advised may receive this vaccine at local pharmacy or Health Dept.or vaccine clinic. Aware to provide a copy of the vaccination record if obtained from local pharmacy or Health Dept. Verbalized acceptance and understanding.  Qualifies for Shingles Vaccine? Yes   Zostavax completed No   Shingrix Completed?: No.    Education has been provided regarding the importance of this vaccine. Patient has been advised to call insurance company to determine out of pocket expense if they have not yet received this vaccine. Advised may also receive vaccine at local pharmacy or Health Dept. Verbalized acceptance and understanding.  Screening Tests Health Maintenance  Topic Date Due   DTaP/Tdap/Td (2 - Td or Tdap) 09/01/2023   Diabetic kidney evaluation - Urine ACR  10/08/2023   Diabetic kidney evaluation - eGFR measurement  10/23/2023   Fecal DNA (Cologuard)  10/05/2023 (Originally 06/17/2021)   INFLUENZA VACCINE  12/15/2023 (Originally 04/17/2023)   Medicare Annual Wellness (AWV)  09/29/2024   MAMMOGRAM  01/21/2025   Pneumonia Vaccine 36+ Years old  Completed   DEXA SCAN  Completed   HPV VACCINES  Aged Out   COVID-19 Vaccine  Discontinued   Hepatitis C Screening  Discontinued   Zoster Vaccines- Shingrix  Discontinued    Health Maintenance  Health Maintenance Due  Topic Date Due   DTaP/Tdap/Td (2 - Td or Tdap) 09/01/2023   Diabetic kidney evaluation - Urine ACR  10/08/2023   Diabetic kidney evaluation - eGFR measurement  10/23/2023    Colorectal cancer screening: Type of screening: Colonoscopy. Completed 2022. Repeat every 5 years  Mammogram status: Completed 01/22/2023. Repeat every year  Bone Density status: Completed 06/19/2023. Results reflect: Bone  density results: NORMAL. Repeat every 5 years.  Lung Cancer Screening: (Low Dose CT Chest recommended if Age 34-80 years, 20 pack-year currently smoking OR have quit w/in 15years.) does not qualify.   Lung Cancer Screening Referral: no  Additional Screening:  Hepatitis C Screening: does not qualify; Completed no  Vision Screening: Recommended annual ophthalmology exams for early detection of glaucoma and other disorders of the eye. Is the patient up to date with their annual eye exam?  Yes  Who is the provider or what is the name of the office in which the patient attends annual eye exams? Dr Myrna If pt is not established with a provider, would they like to be referred to a provider to establish care? No .   Dental Screening: Recommended annual dental exams for proper oral hygiene  Diabetic Foot Exam: n/a  Community Resource Referral / Chronic Care Management: CRR required this visit?  No   CCM required this visit?  No    Plan:     I have personally reviewed and noted the following in the patient's chart:   Medical and social history Use of alcohol, tobacco or illicit drugs  Current medications and supplements including opioid prescriptions. Patient is not currently taking opioid prescriptions. Functional ability and status Nutritional status Physical activity Advanced directives List of other physicians Hospitalizations, surgeries, and ER visits in previous 12 months Vitals Screenings to include cognitive, depression, and falls Referrals and appointments  In addition, I have reviewed and discussed with patient certain preventive protocols, quality metrics, and best practice recommendations. A written personalized care plan for preventive services as well as general preventive health recommendations were provided to patient.    Erminio LITTIE Saris, LPN   [id number]   After Visit Summary: (MyChart) Due to this being a telephonic visit, the after visit summary with patients  personalized plan was offered to patient via MyChart   Nurse Notes: The patient states she is doing well and has no concerns or questions at this time.

## 2023-09-30 NOTE — Patient Instructions (Addendum)
 Ms. Nylander , Thank you for taking time to come for your Medicare Wellness Visit. I appreciate your ongoing commitment to your health goals. Please review the following plan we discussed and let me know if I can assist you in the future.   Referrals/Orders/Follow-Ups/Clinician Recommendations: none  This is a list of the screening recommended for you and due dates:  Health Maintenance  Topic Date Due   DTaP/Tdap/Td vaccine (2 - Td or Tdap) 09/01/2023   Yearly kidney health urinalysis for diabetes  10/08/2023   Yearly kidney function blood test for diabetes  10/23/2023   Cologuard (Stool DNA test)  10/05/2023*   Flu Shot  12/15/2023*   Medicare Annual Wellness Visit  09/29/2024   Mammogram  01/21/2025   Pneumonia Vaccine  Completed   DEXA scan (bone density measurement)  Completed   HPV Vaccine  Aged Out   COVID-19 Vaccine  Discontinued   Hepatitis C Screening  Discontinued   Zoster (Shingles) Vaccine  Discontinued  *Topic was postponed. The date shown is not the original due date.    Advanced directives: (Declined) Advance directive discussed with you today. Even though you declined this today, please call our office should you change your mind, and we can give you the proper paperwork for you to fill out.  Next Medicare Annual Wellness Visit scheduled for next year: Yes 10/01/24 @ 11:30am in person

## 2023-10-05 ENCOUNTER — Encounter: Payer: Self-pay | Admitting: Family

## 2023-10-05 DIAGNOSIS — J0110 Acute frontal sinusitis, unspecified: Secondary | ICD-10-CM

## 2023-10-06 MED ORDER — AMOXICILLIN-POT CLAVULANATE 875-125 MG PO TABS: 1.0000 | ORAL_TABLET | Freq: Two times a day (BID) | ORAL | 0 refills | Status: AC

## 2023-10-10 ENCOUNTER — Encounter: Payer: Self-pay | Admitting: Family

## 2023-10-10 ENCOUNTER — Ambulatory Visit (INDEPENDENT_AMBULATORY_CARE_PROVIDER_SITE_OTHER): Payer: PPO | Admitting: Family

## 2023-10-10 VITALS — BP 130/82 | HR 96 | Temp 98.0°F | Ht 64.75 in | Wt 167.4 lb

## 2023-10-10 DIAGNOSIS — E119 Type 2 diabetes mellitus without complications: Secondary | ICD-10-CM | POA: Diagnosis not present

## 2023-10-10 DIAGNOSIS — F411 Generalized anxiety disorder: Secondary | ICD-10-CM | POA: Diagnosis not present

## 2023-10-10 DIAGNOSIS — I700 Atherosclerosis of aorta: Secondary | ICD-10-CM

## 2023-10-10 DIAGNOSIS — Z6828 Body mass index (BMI) 28.0-28.9, adult: Secondary | ICD-10-CM | POA: Diagnosis not present

## 2023-10-10 DIAGNOSIS — I10 Essential (primary) hypertension: Secondary | ICD-10-CM

## 2023-10-10 DIAGNOSIS — E663 Overweight: Secondary | ICD-10-CM

## 2023-10-10 DIAGNOSIS — Z7985 Long-term (current) use of injectable non-insulin antidiabetic drugs: Secondary | ICD-10-CM | POA: Diagnosis not present

## 2023-10-10 DIAGNOSIS — Z0000 Encounter for general adult medical examination without abnormal findings: Secondary | ICD-10-CM | POA: Insufficient documentation

## 2023-10-10 DIAGNOSIS — K8689 Other specified diseases of pancreas: Secondary | ICD-10-CM | POA: Diagnosis not present

## 2023-10-10 DIAGNOSIS — E7849 Other hyperlipidemia: Secondary | ICD-10-CM | POA: Diagnosis not present

## 2023-10-10 DIAGNOSIS — R7303 Prediabetes: Secondary | ICD-10-CM

## 2023-10-10 DIAGNOSIS — R7989 Other specified abnormal findings of blood chemistry: Secondary | ICD-10-CM

## 2023-10-10 DIAGNOSIS — Z23 Encounter for immunization: Secondary | ICD-10-CM

## 2023-10-10 MED ORDER — TIRZEPATIDE 2.5 MG/0.5ML ~~LOC~~ SOAJ: 2.5000 mg | SUBCUTANEOUS | 0 refills | Status: DC

## 2023-10-10 NOTE — Patient Instructions (Signed)
  Stop by the lab prior to leaving today. I will notify you of your results once received.   Recommendations on keeping yourself healthy:  - Exercise at least 30-45 minutes a day, 3-4 days a week.  - Eat a low-fat diet with lots of fruits and vegetables, up to 7-9 servings per day.  - Seatbelts can save your life. Wear them always.  - Smoke detectors on every level of your home, check batteries every year.  - Eye Doctor - have an eye exam every 1-2 years  - Safe sex - if you may be exposed to STDs, use a condom.  - Alcohol -  If you drink, do it moderately, less than 2 drinks per day.  - Health Care Power of Attorney. Choose someone to speak for you if you are not able.  - Depression is common in our stressful world.If you're feeling down or losing interest in things you normally enjoy, please come in for a visit.  - Violence - If anyone is threatening or hurting you, please call immediately.  Due to recent changes in healthcare laws, you may see results of your imaging and/or laboratory studies on MyChart before I have had a chance to review them.  I understand that in some cases there may be results that are confusing or concerning to you. Please understand that not all results are received at the same time and often I may need to interpret multiple results in order to provide you with the best plan of care or course of treatment. Therefore, I ask that you please give me 2 business days to thoroughly review all your results before contacting my office for clarification. Should we see a critical lab result, you will be contacted sooner.   I will see you again in one year for your annual comprehensive exam unless otherwise stated and or with acute concerns.  It was a pleasure seeing you today! Please do not hesitate to reach out with any questions and or concerns.  Regards,   Mort Sawyers

## 2023-10-10 NOTE — Assessment & Plan Note (Signed)
Strict lipid control  She is on repatha

## 2023-10-10 NOTE — Progress Notes (Addendum)
Subjective:  Patient ID: [name], female    DOB: December 16, 1955  Age: 68 y.o. MRN: [id number]  Patient Care Team: Mort Sawyers, FNP as PCP - General (Family Medicine) End, Cristal Deer, MD as PCP - Cardiology (Cardiology) Kathyrn Sheriff, [facility] (Inactive) as Pharmacist (Pharmacist) Nevada Crane, MD as Consulting Physician (Ophthalmology)   CC:  Chief Complaint  Patient presents with  . Annual Exam    HPI [name] is a 68 y.o. female who presents today for an annual physical exam. She reports consuming a general diet.  For exercise she is constantly on her feet however not a an actual exercise routine  She generally feels well. She reports sleeping well. She does have additional problems to discuss today.   Vision:Within last year Dental:Receives regular dental care  Mammogram: May 8th 2024  Last pap: > 41 y/o  Colonoscopy: 03/06/22 Bone density scan: 06/19/23 normal   Pt is with acute concerns.   Obesity: was taking mounjaro however pharmacy only filled her 5 mg first and she had a lot of nausea vomiting, she also was in the holiday season so felt more ill because of how she was eating. She did get permission and clearance from her GI to proceed with GLP 1.she would like to retry at the lowest dose. She has had success in the short term with weight loss 8 pounds.   Wt Readings from Last 3 Encounters:  10/10/23 167 lb 6.4 oz (75.9 kg)  09/30/23 167 lb (75.8 kg)  07/31/23 175 lb 9.6 oz (79.7 kg)     Advanced Directives Patient does not have advanced directives    DEPRESSION SCREENING    10/10/2023   10:25 AM 09/30/2023   10:01 AM 10/22/2022    2:35 PM 10/07/2022   12:33 PM 10/04/2022    9:35 AM 10/01/2021   10:33 AM 10/01/2021    9:55 AM  PHQ 2/9 Scores  PHQ - 2 Score 0 0 0 0 0 0 0  PHQ- 9 Score 0  0 0   0     ROS: Negative unless specifically indicated above in HPI.    Current Outpatient Medications:  .  amLODipine (NORVASC) 5 MG tablet, Take 1  tablet (5 mg total) by mouth daily., Disp: 90 tablet, Rfl: 3 .  amoxicillin-clavulanate (AUGMENTIN) 875-125 MG tablet, Take 1 tablet by mouth 2 (two) times daily for 7 days., Disp: 14 tablet, Rfl: 0 .  azelastine (ASTELIN) 0.1 % nasal spray, SPRAY 1-2 SPRAYS IN EACH NOSTRIL TWICE DAILY., Disp: , Rfl:  .  DULoxetine (CYMBALTA) 60 MG capsule, Take 60 mg by mouth daily., Disp: , Rfl:  .  Evolocumab (REPATHA SURECLICK) 140 MG/ML SOAJ, Inject 140 mg into the skin every 14 (fourteen) days., Disp: 6 mL, Rfl: 3 .  fluticasone (FLONASE) 50 MCG/ACT nasal spray, SPRAY 2 SPRAYS INTO EACH NOSTRIL EVERY DAY, Disp: 48 mL, Rfl: 2 .  ibuprofen (ADVIL) 800 MG tablet, Take 1 tablet (800 mg total) by mouth every 8 (eight) hours as needed., Disp: 90 tablet, Rfl: 0 .  Multiple Vitamin (MULTIVITAMIN) tablet, Take 1 tablet by mouth daily., Disp: , Rfl:  .  omeprazole (PRILOSEC) 20 MG capsule, TAKE 1 CAPSULE BY MOUTH EVERY DAY, Disp: 90 capsule, Rfl: 0 .  Pancrelipase, Lip-Prot-Amyl, (ZENPEP) 40981-191478 units CPEP, Take 1 capsule by mouth 3 (three) times daily with meals., Disp: 90 capsule, Rfl: 6 .  tirzepatide (MOUNJARO) 2.5 MG/0.5ML Pen, Inject 2.5 mg into the skin once a  week., Disp: 2 mL, Rfl: 0    Objective:    BP 130/82 (BP Location: Left Arm, Patient Position: Sitting, Cuff Size: Normal)   Pulse 96   Temp 98 F (36.7 C) (Temporal)   Ht 5' 4.75" (1.645 m)   Wt 167 lb 6.4 oz (75.9 kg)   SpO2 97%   BMI 28.07 kg/m   BP Readings from Last 3 Encounters:  10/10/23 130/82  09/30/23 120/80  07/31/23 122/82      Physical Exam Constitutional:      General: She is not in acute distress.    Appearance: Normal appearance. She is normal weight. She is not ill-appearing.  HENT:     Head: Normocephalic.     Right Ear: Tympanic membrane normal.     Left Ear: Tympanic membrane normal.     Nose: Nose normal.     Mouth/Throat:     Mouth: Mucous membranes are moist.  Eyes:     Extraocular Movements:  Extraocular movements intact.     Pupils: Pupils are equal, round, and reactive to light.  Cardiovascular:     Rate and Rhythm: Normal rate and regular rhythm.  Pulmonary:     Effort: Pulmonary effort is normal.     Breath sounds: Normal breath sounds.  Abdominal:     General: Abdomen is flat. Bowel sounds are normal.     Palpations: Abdomen is soft.     Tenderness: There is no guarding or rebound.  Musculoskeletal:        General: Normal range of motion.     Cervical back: Normal range of motion.  Skin:    General: Skin is warm.     Capillary Refill: Capillary refill takes less than 2 seconds.  Neurological:     General: No focal deficit present.     Mental Status: She is alert.  Psychiatric:        Mood and Affect: Mood normal.        Behavior: Behavior normal.        Thought Content: Thought content normal.        Judgment: Judgment normal.         Assessment & Plan:  Pancreatic insufficiency  Overweight with body mass index (BMI) of 28 to 28.9 in adult Assessment & Plan: Pt advised to work on diet and exercise as tolerated Restart mounjaro 2.5 mg weekly  Slow increase to help with symptom control    Orders: -     Tirzepatide; Inject 2.5 mg into the skin once a week.  Dispense: 2 mL; Refill: 0  Prediabetes Assessment & Plan: Pt advised of the following: Work on a diabetic diet, try to incorporate exercise at least 20-30 a day for 3 days a week or more.  Repeat A1c today pending results.    Primary hypertension Assessment & Plan: Stable. Continue medications as prescribed.  Orders: -     Tirzepatide; Inject 2.5 mg into the skin once a week.  Dispense: 2 mL; Refill: 0  Aortic atherosclerosis (HCC) Assessment & Plan: Strict lipid control  She is on repatha   Orders: -     Tirzepatide; Inject 2.5 mg into the skin once a week.  Dispense: 2 mL; Refill: 0  Encounter for general adult medical examination without abnormal findings Assessment &  Plan: Patient Counseling(The following topics were reviewed):  Preventative care handout given to pt  Health maintenance and immunizations reviewed. Please refer to Health maintenance section. Pt advised on safe sex, wearing seatbelts  in car, and proper nutrition labwork ordered today for annual Dental health: Discussed importance of regular tooth brushing, flossing, and dental visits.     Familial hyperlipidemia Assessment & Plan: Followed by lipid clinic doing well on repatha   Anxiety state Assessment & Plan: Stable  Continue Cymbalta 60 mg once daily    Controlled type 2 diabetes mellitus without complication, without long-term current use of insulin (HCC) -     Tirzepatide; Inject 2.5 mg into the skin once a week.  Dispense: 2 mL; Refill: 0 -     Microalbumin / creatinine urine ratio; Future -     Comprehensive metabolic panel; Future -     Hemoglobin A1c; Future  High serum vitamin B12 -     Vitamin B12; Future      Follow-up: Return in about 3 months (around 01/08/2024) for f/u weight loss medication, f/u diabetes.   Mort Sawyers, FNP

## 2023-10-10 NOTE — Assessment & Plan Note (Signed)
Pt advised to work on diet and exercise as tolerated Restart mounjaro 2.5 mg weekly  Slow increase to help with symptom control

## 2023-10-10 NOTE — Assessment & Plan Note (Addendum)

## 2023-10-10 NOTE — Assessment & Plan Note (Signed)
-  Stable  -Continue Cymbalta 60 mg once daily

## 2023-10-10 NOTE — Assessment & Plan Note (Signed)
Pt advised of the following: Work on a diabetic diet, try to incorporate exercise at least 20-30 a day for 3 days a week or more.  Repeat A1c today pending results.

## 2023-10-10 NOTE — Assessment & Plan Note (Signed)
Followed by lipid clinic doing well on repatha

## 2023-10-10 NOTE — Addendum Note (Signed)
Addended by: Jaynee Eagles C on: 10/10/2023 11:01 AM   Modules accepted: Orders

## 2023-10-10 NOTE — Assessment & Plan Note (Signed)
Stable. Continue medications as prescribed.

## 2023-10-10 NOTE — Addendum Note (Signed)
Addended by: Mort Sawyers on: 10/10/2023 10:51 AM   Modules accepted: Level of Service

## 2023-10-16 ENCOUNTER — Other Ambulatory Visit: Payer: Self-pay | Admitting: Internal Medicine

## 2023-10-16 DIAGNOSIS — D225 Melanocytic nevi of trunk: Secondary | ICD-10-CM | POA: Diagnosis not present

## 2023-10-16 DIAGNOSIS — D2271 Melanocytic nevi of right lower limb, including hip: Secondary | ICD-10-CM | POA: Diagnosis not present

## 2023-10-16 DIAGNOSIS — D2261 Melanocytic nevi of right upper limb, including shoulder: Secondary | ICD-10-CM | POA: Diagnosis not present

## 2023-10-16 DIAGNOSIS — E7801 Familial hypercholesterolemia: Secondary | ICD-10-CM

## 2023-10-16 DIAGNOSIS — Z808 Family history of malignant neoplasm of other organs or systems: Secondary | ICD-10-CM | POA: Diagnosis not present

## 2023-10-16 DIAGNOSIS — D2262 Melanocytic nevi of left upper limb, including shoulder: Secondary | ICD-10-CM | POA: Diagnosis not present

## 2023-10-16 DIAGNOSIS — L821 Other seborrheic keratosis: Secondary | ICD-10-CM | POA: Diagnosis not present

## 2023-10-16 DIAGNOSIS — D2272 Melanocytic nevi of left lower limb, including hip: Secondary | ICD-10-CM | POA: Diagnosis not present

## 2023-10-16 NOTE — Telephone Encounter (Signed)
Refill Request.

## 2023-10-16 NOTE — Telephone Encounter (Signed)
Please see note below.

## 2023-10-17 ENCOUNTER — Encounter: Payer: Self-pay | Admitting: Family

## 2023-10-19 ENCOUNTER — Other Ambulatory Visit: Payer: Self-pay | Admitting: Family

## 2023-10-19 DIAGNOSIS — F419 Anxiety disorder, unspecified: Secondary | ICD-10-CM

## 2023-11-03 ENCOUNTER — Encounter: Payer: Self-pay | Admitting: Family

## 2023-11-13 NOTE — Progress Notes (Unsigned)
 Cardiology Clinic Note   Date: 11/14/2023 ID: Kevia, Zaucha 1955/11/15, MRN [id number]  Primary Cardiologist:  Yvonne Kendall, MD  Chief Complaint   [name] is a 67 y.o. female who presents to the clinic today for routine follow up.   Patient Profile   [name] is followed by Dr. Okey Dupre for the history outlined below.      Past medical history significant for: Dyspnea. Echo 05/28/2022: EF 60 to 65%.  No RWMA.  Grade 1 DD.  Normal RV size/function.  No significant valvular abnormalities. Aortic atherosclerosis. Hypertension. Familial hyperlipidemia. CT cardiac scoring 07/27/2018: Coronary calcium score of 0. Lipid panel 07/31/2023: LDL 67, HDL 57, TG 183, total 160. T2DM. GERD. Right breast cancer.  In summary, patient was previously been seen by Dr. Elease Hashimoto in September 2015 for lower extremity edema with no other symptoms and a normal BNP and no further workup was indicated at that time.  She was first evaluated by Dr. Okey Dupre on 07/08/2018 for hyperlipidemia with history of statin intolerance at the request of Dr. Dayton Martes.  She reported intolerance to multiple statins including pravastatin, rosuvastatin, and simvastatin even at low doses.  She underwent CT cardiac scoring which showed a calcium score of 0.  Given persistently high cholesterol with LDL in the 200s patient was started on Repatha.  In August 2023 patient complained of fatigue and DOE.  Echo September 2023 showed EF 60 to 65% as detailed above.  Patient was last seen in the office by Dr. Okey Dupre on 01/30/2023 for routine follow-up.  She was doing well at that time with well-controlled BP.  No medication changes were made.     History of Present Illness    Today, patient is doing well. Patient denies shortness of breath, dyspnea on exertion, lower extremity edema, orthopnea or PND. No chest pain, pressure, or tightness. No palpitations.  She recently started on Mounjaro and has last 15 lb. She does not plan on  staying on it long term but is hoping it will kick start her weight loss. She joined a gym and is looking into yoga. She loves to walk and does this weather permitting. She also has 2 flights of stairs in her home that she navigates several times a day for laundry and other tasks.     ROS: All other systems reviewed and are otherwise negative except as noted in History of Present Illness.  EKGs/Labs Reviewed    EKG Interpretation Date/Time:  Friday November 14 2023 13:58:36 EST Ventricular Rate:  84 PR Interval:  128 QRS Duration:  82 QT Interval:  362 QTC Calculation: 427 R Axis:   -20  Text Interpretation: Normal sinus rhythm Nonspecific ST and T wave abnormality When compared with ECG of 10/31/2022 (Not in [facility]) No significant change Confirmed by Carlos Levering [phone]) on 11/14/2023 2:03:52 PM   07/31/2023: TSH 1.77    Physical Exam    VS:  BP 126/78   Pulse 84   Ht 5\' 5"  (1.651 m)   Wt 160 lb 3.2 oz (72.7 kg)   SpO2 96%   BMI 26.66 kg/m  , BMI Body mass index is 26.66 kg/m.  GEN: Well nourished, well developed, in no acute distress. Neck: No JVD or carotid bruits. Cardiac:  RRR. No murmurs. No rubs or gallops.   Respiratory:  Respirations regular and unlabored. Clear to auscultation without rales, wheezing or rhonchi. GI: Soft, nontender, nondistended. Extremities: Radials/DP/PT 2+ and equal bilaterally. No clubbing or cyanosis. No  edema.  Skin: Warm and dry, no rash. Neuro: Strength intact.  Assessment & Plan   Hypertension BP today 126/78. No report of dizziness or headaches.  -Continue amlodipine. -She has lab work coming up with PCP.   Familial hyperlipidemia CT cardiac scoring November 2019 demonstrating calcium score of 0.  LDL November 2024 67, at goal. -Continue Repatha.  Disposition: Return in 1 year or sooner as needed.          Signed, Etta Grandchild. Ahlana Slaydon, DNP, NP-C

## 2023-11-14 ENCOUNTER — Ambulatory Visit: Payer: HMO | Attending: Student | Admitting: Student

## 2023-11-14 ENCOUNTER — Encounter: Payer: Self-pay | Admitting: Student

## 2023-11-14 VITALS — BP 126/78 | HR 84 | Ht 65.0 in | Wt 160.2 lb

## 2023-11-14 DIAGNOSIS — I10 Essential (primary) hypertension: Secondary | ICD-10-CM

## 2023-11-14 DIAGNOSIS — E7801 Familial hypercholesterolemia: Secondary | ICD-10-CM | POA: Diagnosis not present

## 2023-11-14 NOTE — Patient Instructions (Signed)
 Medication Instructions:  Your Physician recommend you continue on your current medication as directed.    *If you need a refill on your cardiac medications before your next appointment, please call your pharmacy*   Lab Work: None ordered at this time  If you have labs (blood work) drawn today and your tests are completely normal, you will receive your results only by: MyChart Message (if you have MyChart) OR A paper copy in the mail If you have any lab test that is abnormal or we need to change your treatment, we will call you to review the results.   Follow-Up: At [facility], you and your health needs are our priority.  As part of our continuing mission to provide you with exceptional heart care, we have created designated Provider Care Teams.  These Care Teams include your primary Cardiologist (physician) and Advanced Practice Providers (APPs -  Physician Assistants and Nurse Practitioners) who all work together to provide you with the care you need, when you need it.   Your next appointment:   1 year(s)  Provider:   Yvonne Kendall, MD or Carlos Levering, NP

## 2023-11-18 DIAGNOSIS — G2581 Restless legs syndrome: Secondary | ICD-10-CM | POA: Diagnosis not present

## 2023-11-18 DIAGNOSIS — I872 Venous insufficiency (chronic) (peripheral): Secondary | ICD-10-CM | POA: Diagnosis not present

## 2023-11-18 DIAGNOSIS — R252 Cramp and spasm: Secondary | ICD-10-CM | POA: Diagnosis not present

## 2023-11-18 DIAGNOSIS — I83893 Varicose veins of bilateral lower extremities with other complications: Secondary | ICD-10-CM | POA: Diagnosis not present

## 2023-11-18 DIAGNOSIS — R600 Localized edema: Secondary | ICD-10-CM | POA: Diagnosis not present

## 2023-11-25 ENCOUNTER — Encounter: Payer: Self-pay | Admitting: Family

## 2023-11-28 ENCOUNTER — Other Ambulatory Visit: Payer: Self-pay | Admitting: Family

## 2023-11-28 ENCOUNTER — Encounter: Payer: Self-pay | Admitting: Family

## 2023-11-28 DIAGNOSIS — Z6828 Body mass index (BMI) 28.0-28.9, adult: Secondary | ICD-10-CM

## 2023-11-28 DIAGNOSIS — I10 Essential (primary) hypertension: Secondary | ICD-10-CM

## 2023-11-28 DIAGNOSIS — I700 Atherosclerosis of aorta: Secondary | ICD-10-CM

## 2023-11-28 DIAGNOSIS — E119 Type 2 diabetes mellitus without complications: Secondary | ICD-10-CM

## 2023-11-30 ENCOUNTER — Other Ambulatory Visit: Payer: Self-pay | Admitting: Family

## 2023-11-30 DIAGNOSIS — K2960 Other gastritis without bleeding: Secondary | ICD-10-CM

## 2023-12-02 ENCOUNTER — Encounter: Payer: Self-pay | Admitting: Pharmacist

## 2023-12-02 NOTE — Progress Notes (Signed)
 Pharmacy Quality Measure Review  This patient is appearing on a report for being at risk of failing the adherence measure for diabetes medications this calendar year.   Medication: Mounjaro 2.5 mg  Last fill date: 10/10/23 for 28 day supply  Insurance report was not up to date. No action needed at this time.  Prescription refill has been processed as of 12/02/23 for 84 day supply  No further action needed at this time.

## 2023-12-30 ENCOUNTER — Other Ambulatory Visit (INDEPENDENT_AMBULATORY_CARE_PROVIDER_SITE_OTHER)

## 2023-12-30 DIAGNOSIS — R7989 Other specified abnormal findings of blood chemistry: Secondary | ICD-10-CM

## 2023-12-30 DIAGNOSIS — E119 Type 2 diabetes mellitus without complications: Secondary | ICD-10-CM | POA: Diagnosis not present

## 2023-12-30 LAB — COMPREHENSIVE METABOLIC PANEL WITH GFR
ALT: 15 U/L (ref 0–35)
AST: 19 U/L (ref 0–37)
Albumin: 4.5 g/dL (ref 3.5–5.2)
Alkaline Phosphatase: 81 U/L (ref 39–117)
BUN: 14 mg/dL (ref 6–23)
CO2: 34 meq/L — ABNORMAL HIGH (ref 19–32)
Calcium: 10 mg/dL (ref 8.4–10.5)
Chloride: 101 meq/L (ref 96–112)
Creatinine, Ser: 0.8 mg/dL (ref 0.40–1.20)
GFR: 76.29 mL/min (ref >60.00)
Glucose, Bld: 106 mg/dL — ABNORMAL HIGH (ref 70–99)
Potassium: 4.1 meq/L (ref 3.5–5.1)
Sodium: 141 meq/L (ref 135–145)
Total Bilirubin: 0.5 mg/dL (ref 0.2–1.2)
Total Protein: 7.5 g/dL (ref 6.0–8.3)

## 2023-12-30 LAB — MICROALBUMIN / CREATININE URINE RATIO
Creatinine,U: 110.1 mg/dL
Microalb Creat Ratio: 7.7 mg/g (ref 0.0–30.0)
Microalb, Ur: 0.9 mg/dL (ref 0.0–1.9)

## 2023-12-30 LAB — VITAMIN B12: Vitamin B-12: 910 pg/mL (ref 211–911)

## 2023-12-30 LAB — HEMOGLOBIN A1C: Hgb A1c MFr Bld: 6.3 % (ref 4.6–6.5)

## 2023-12-31 ENCOUNTER — Encounter: Payer: Self-pay | Admitting: Family

## 2024-01-08 ENCOUNTER — Ambulatory Visit: Payer: PPO | Admitting: Family

## 2024-01-12 ENCOUNTER — Encounter: Payer: Self-pay | Admitting: Family

## 2024-01-12 ENCOUNTER — Ambulatory Visit (INDEPENDENT_AMBULATORY_CARE_PROVIDER_SITE_OTHER): Admitting: Family

## 2024-01-12 VITALS — BP 120/80 | HR 88 | Temp 98.1°F | Wt 160.4 lb

## 2024-01-12 DIAGNOSIS — Z7985 Long-term (current) use of injectable non-insulin antidiabetic drugs: Secondary | ICD-10-CM | POA: Diagnosis not present

## 2024-01-12 DIAGNOSIS — E119 Type 2 diabetes mellitus without complications: Secondary | ICD-10-CM

## 2024-01-12 MED ORDER — TIRZEPATIDE 5 MG/0.5ML ~~LOC~~ SOAJ: 5.0000 mg | SUBCUTANEOUS | 0 refills | Status: DC

## 2024-01-12 NOTE — Assessment & Plan Note (Signed)
 Increase mounjaro to 5 mg weekly  Pt advised to:  Work on diabetic diet and exercise as tolerated. Yearly foot exam, and annual eye exam.

## 2024-01-12 NOTE — Progress Notes (Signed)
 Established Patient Office Visit  Subjective:      CC:  Chief Complaint  Patient presents with   Medical Management of Chronic Issues    HPI: [name] is a 68 y.o. female presenting on 01/12/2024 for Medical Management of Chronic Issues . DM2: last A1c 6.3 12/30/23, on mounjaro 2.5 mg weekly tolerating well only slight nausea and also indigestion.        Social history:  Relevant past medical, surgical, family and social history reviewed and updated as indicated. Interim medical history since our last visit reviewed.  Allergies and medications reviewed and updated.  DATA REVIEWED: CHART IN EPIC     ROS: Negative unless specifically indicated above in HPI.    Current Outpatient Medications:    amLODipine  (NORVASC ) 5 MG tablet, Take 1 tablet (5 mg total) by mouth daily., Disp: 90 tablet, Rfl: 3   azelastine (ASTELIN) 0.1 % nasal spray, SPRAY 1-2 SPRAYS IN EACH NOSTRIL TWICE DAILY., Disp: , Rfl:    DULoxetine  (CYMBALTA ) 60 MG capsule, TAKE 1 CAPSULE BY MOUTH EVERY DAY, Disp: 90 capsule, Rfl: 1   Evolocumab  (REPATHA  SURECLICK) 140 MG/ML SOAJ, INJECT 1 ML SUBCUTANEOUSLY EVERY 2 WEEKS IN ABDOMEN,THIGH,OR OUTER AREA OF UPPER ARM (ROTATE SITES), Disp: 2 mL, Rfl: 5   fluticasone  (FLONASE ) 50 MCG/ACT nasal spray, SPRAY 2 SPRAYS INTO EACH NOSTRIL EVERY DAY, Disp: 48 mL, Rfl: 2   ibuprofen  (ADVIL ) 800 MG tablet, Take 1 tablet (800 mg total) by mouth every 8 (eight) hours as needed., Disp: 90 tablet, Rfl: 0   Multiple Vitamin (MULTIVITAMIN) tablet, Take 1 tablet by mouth daily., Disp: , Rfl:    omeprazole  (PRILOSEC) 20 MG capsule, TAKE 1 CAPSULE BY MOUTH EVERY DAY, Disp: 90 capsule, Rfl: 1   Pancrelipase , Lip-Prot-Amyl, (ZENPEP ) 60000-189600 units CPEP, Take 1 capsule by mouth 3 (three) times daily with meals., Disp: 90 capsule, Rfl: 6   tirzepatide (MOUNJARO) 5 MG/0.5ML Pen, Inject 5 mg into the skin once a week., Disp: 6 mL, Rfl: 0      Objective:    BP 120/80 (BP  Location: Left Arm, Patient Position: Sitting, Cuff Size: Normal)   Pulse 88   Temp 98.1 F (36.7 C) (Temporal)   Wt 160 lb 6.4 oz (72.8 kg)   SpO2 97%   BMI 26.69 kg/m   Wt Readings from Last 3 Encounters:  01/12/24 160 lb 6.4 oz (72.8 kg)  11/14/23 160 lb 3.2 oz (72.7 kg)  10/10/23 167 lb 6.4 oz (75.9 kg)    Physical Exam Vitals reviewed.  Constitutional:      General: She is not in acute distress.    Appearance: Normal appearance. She is normal weight. She is not ill-appearing, toxic-appearing or diaphoretic.  HENT:     Head: Normocephalic.  Cardiovascular:     Rate and Rhythm: Normal rate and regular rhythm.  Pulmonary:     Effort: Pulmonary effort is normal.  Musculoskeletal:        General: Normal range of motion.  Neurological:     General: No focal deficit present.     Mental Status: She is alert and oriented to person, place, and time. Mental status is at baseline.  Psychiatric:        Mood and Affect: Mood normal.        Behavior: Behavior normal.        Thought Content: Thought content normal.        Judgment: Judgment normal.  Assessment & Plan:  Controlled type 2 diabetes mellitus without complication, without long-term current use of insulin (HCC) Assessment & Plan: Increase mounjaro to 5 mg weekly  Pt advised to:  Work on diabetic diet and exercise as tolerated. Yearly foot exam, and annual eye exam.    Orders: -     Tirzepatide; Inject 5 mg into the skin once a week.  Dispense: 6 mL; Refill: 0     Return in about 6 months (around 07/13/2024) for f/u diabetes.  Felicita Horns, MSN, APRN, FNP-C  [facility]

## 2024-01-15 ENCOUNTER — Other Ambulatory Visit: Payer: Self-pay | Admitting: Internal Medicine

## 2024-01-15 DIAGNOSIS — I10 Essential (primary) hypertension: Secondary | ICD-10-CM

## 2024-01-20 DIAGNOSIS — D3131 Benign neoplasm of right choroid: Secondary | ICD-10-CM | POA: Diagnosis not present

## 2024-01-20 DIAGNOSIS — H2513 Age-related nuclear cataract, bilateral: Secondary | ICD-10-CM | POA: Diagnosis not present

## 2024-01-20 DIAGNOSIS — H04123 Dry eye syndrome of bilateral lacrimal glands: Secondary | ICD-10-CM | POA: Diagnosis not present

## 2024-02-02 ENCOUNTER — Encounter: Payer: Self-pay | Admitting: Gastroenterology

## 2024-02-02 ENCOUNTER — Ambulatory Visit: Admitting: Gastroenterology

## 2024-02-02 VITALS — BP 122/78 | HR 78 | Ht 65.0 in | Wt 158.0 lb

## 2024-02-02 DIAGNOSIS — K8689 Other specified diseases of pancreas: Secondary | ICD-10-CM | POA: Diagnosis not present

## 2024-02-02 DIAGNOSIS — K582 Mixed irritable bowel syndrome: Secondary | ICD-10-CM

## 2024-02-02 DIAGNOSIS — K219 Gastro-esophageal reflux disease without esophagitis: Secondary | ICD-10-CM | POA: Diagnosis not present

## 2024-02-02 MED ORDER — ZENPEP 60000-189600 UNITS PO CPEP: 1.0000 | ORAL_CAPSULE | Freq: Three times a day (TID) | ORAL | 3 refills | Status: DC

## 2024-02-02 MED ORDER — OMEPRAZOLE 20 MG PO CPDR: 20.0000 mg | DELAYED_RELEASE_CAPSULE | Freq: Every day | ORAL | 3 refills | Status: AC

## 2024-02-02 NOTE — Patient Instructions (Addendum)
 We have sent the following medications to your pharmacy for you to pick up at your convenience: Zenpep  and omeprazole .   _______________________________________________________  If your blood pressure at your visit was 140/90 or greater, please contact your primary care physician to follow up on this.  _______________________________________________________  If you are age 68 or older, your body mass index should be between 23-30. Your Body mass index is 26.29 kg/m. If this is out of the aforementioned range listed, please consider follow up with your Primary Care Provider.  If you are age 63 or younger, your body mass index should be between 19-25. Your Body mass index is 26.29 kg/m. If this is out of the aformentioned range listed, please consider follow up with your Primary Care Provider.   ________________________________________________________  The [facility] providers would like to encourage you to use MYCHART to communicate with providers for non-urgent requests or questions.  Due to long hold times on the telephone, sending your provider a message by [facility] may be a faster and more efficient way to get a response.  Please allow 48 business hours for a response.  Please remember that this is for non-urgent requests.  _______________________________________________________

## 2024-02-02 NOTE — Progress Notes (Signed)
 02/02/2024 [name] [id number] November 13, 1955   HISTORY OF PRESENT ILLNESS:  This is a 68 year old very female who is a patient of Dr. Allean Aran.  She is here for follow-up visit for pancreatic insufficiency and GERD.   Last seen on 06-19-23 and was given samples of Zenpep  60K units to take, 1 tablet 3 times a day with meals.  She says that she is taking that and tolerating it well, works well.  Using MiraLAX, titrating it to help with her bowel habits as well.  Takes omeprazole  20 mg daily, but really just more on an as-needed basis for reflux.  Needs refills on her Zenpep  and omeprazole .  She says that overall she is feeling really well, no complaints today.   GI Hx: MR Abdomen MRCP 03-24-23 -Diffuse fatty replacement of the pancreas, consistent with pancreatic insufficiency. -No acute findings.  No evidence of pancreatitis or pancreatic mass.   Right upper quadrant abdominal ultrasound 11/04/2022 Probable sludge in the gallbladder. No evidence of acute cholecystitis.   CT abdomen pelvis with contrast 10/22/2022 1. No acute abnormality. No evidence of bowel obstruction or acute bowel inflammation. Mild sigmoid diverticulosis, with no evidence of acute diverticulitis. Normal appendix. 2. Chronic fatty replacement of much of the pancreas. Consider pancreatic insufficiency. 3.  Aortic Atherosclerosis    Fecal elastase 2/22 2024: 224, normal Normal amylase and lipase October 22, 2022   Colonoscopy March 06, 2022 - One 1 mm polyp in the transverse colon, removed with a cold biopsy forceps. Resected and retrieved. - Diverticulosis in the sigmoid colon. - Non-bleeding external and internal hemorrhoids.    Past Medical History:  Diagnosis Date   Anemia    Anxiety    Blood transfusion without reported diagnosis    Breast cancer (HCC) 02/27/2007   Right   COVID-19 08/30/2020   GERD (gastroesophageal reflux disease)    Hypercholesterolemia    Hypertension    Migraine     Hx of in past   Personal history of radiation therapy 2008   Right   Wears dentures    partial upper and lower   Past Surgical History:  Procedure Laterality Date   ABDOMINAL HYSTERECTOMY     BREAST BIOPSY Right 02/27/2007   BREAST LUMPECTOMY Right 04/02/2007   BREAST SURGERY  01/14/2013   breast reduction left side   BROW LIFT Bilateral 06/17/2017   Procedure: BLEPHAROPLASTY UPPER EYELID WITH EXCESS SKIN;  Surgeon: Zacarias Hermann, MD;  Location: [facility];  Service: Ophthalmology;  Laterality: Bilateral;   BROW LIFT Bilateral 11/30/2021   Procedure: BROW PTOSIS REPAIR BILATERAL;  Surgeon: Zacarias Hermann, MD;  Location: [facility];  Service: Ophthalmology;  Laterality: Bilateral;   CESAREAN SECTION     two times   COLONOSCOPY     FOOT SURGERY Right    PTOSIS REPAIR Bilateral 06/17/2017   Procedure: PTOSIS REPAIR RESECT EX;  Surgeon: Zacarias Hermann, MD;  Location: [facility];  Service: Ophthalmology;  Laterality: Bilateral;   REDUCTION MAMMAPLASTY Left    SHOULDER ARTHROSCOPY WITH ROTATOR CUFF REPAIR AND OPEN BICEPS TENODESIS Right 02/01/2019   Procedure: RIGHT SHOULDER ARTHROSCOPY, DEBRIDEMENT, BICEPS TENODESIS, MINI ROTATOR CUFF TEAR REPAIR;  Surgeon: Jasmine Mesi, MD;  Location: [facility];  Service: Orthopedics;  Laterality: Right;    reports that she has never smoked. She has never used smokeless tobacco. She reports that she does not currently use alcohol. She reports that she does not use drugs. family history  includes Alzheimer's disease in her maternal aunt, maternal grandmother, maternal uncle, and mother; Aneurysm in her father; Breast cancer in her cousin, cousin, sister, and sister; COPD in her father; Colon polyps in her father; Diabetes in her paternal grandmother, paternal uncle, and sister; Esophageal cancer in her father; Heart failure in her mother; Heart murmur in her father; Hyperlipidemia in her father and mother;  Hypertension in her mother; Irritable bowel syndrome in her sister; Rheum arthritis in her sister; Throat cancer in her father; Thyroid  disease in her mother; Ulcerative colitis in her sister. Allergies  Allergen Reactions   Ezetimibe  Other (See Comments)    Fatigue and constipation   Hydroxyzine  Other (See Comments)    Drowsiness, excessive   Lisinopril  Other (See Comments)    Increased fatigue, sleeping more than needed   Statins Other (See Comments)    Muscle aches   Creon  [Pancrelipase  (Lip-Prot-Amyl)] Other (See Comments)    itching   Losartan  Other (See Comments)    Fatigue, malaise      Outpatient Encounter Medications as of 02/02/2024  Medication Sig   amLODipine  (NORVASC ) 5 MG tablet TAKE 1 TABLET (5 MG TOTAL) BY MOUTH DAILY.   azelastine (ASTELIN) 0.1 % nasal spray SPRAY 1-2 SPRAYS IN EACH NOSTRIL TWICE DAILY.   DULoxetine  (CYMBALTA ) 60 MG capsule TAKE 1 CAPSULE BY MOUTH EVERY DAY   Evolocumab  (REPATHA  SURECLICK) 140 MG/ML SOAJ INJECT 1 ML SUBCUTANEOUSLY EVERY 2 WEEKS IN ABDOMEN,THIGH,OR OUTER AREA OF UPPER ARM (ROTATE SITES)   fluticasone  (FLONASE ) 50 MCG/ACT nasal spray SPRAY 2 SPRAYS INTO EACH NOSTRIL EVERY DAY   ibuprofen  (ADVIL ) 800 MG tablet Take 1 tablet (800 mg total) by mouth every 8 (eight) hours as needed. (Patient taking differently: Take 800 mg by mouth every 8 (eight) hours as needed. PRN)   Multiple Vitamin (MULTIVITAMIN) tablet Take 1 tablet by mouth daily.   omeprazole  (PRILOSEC) 20 MG capsule TAKE 1 CAPSULE BY MOUTH EVERY DAY   Pancrelipase , Lip-Prot-Amyl, (ZENPEP ) 60000-189600 units CPEP Take 1 capsule by mouth 3 (three) times daily with meals.   tirzepatide (MOUNJARO) 5 MG/0.5ML Pen Inject 5 mg into the skin once a week.   No facility-administered encounter medications on file as of 02/02/2024.    REVIEW OF SYSTEMS  : All other systems reviewed and negative except where noted in the History of Present Illness.   PHYSICAL EXAM: BP 122/78   Pulse 78    Ht 5\' 5"  (1.651 m)   Wt 158 lb (71.7 kg)   SpO2 98%   BMI 26.29 kg/m  General: Well developed white female in no acute distress Head: Normocephalic and atraumatic Eyes:  Sclerae anicteric, conjunctiva pink. Ears: Normal auditory acuity Musculoskeletal: Symmetrical with no gross deformities  Skin: No lesions on visible extremities Neurological: Alert oriented x 4, grossly non-focal Psychological:  Alert and cooperative. Normal mood and affect  ASSESSMENT AND PLAN: 68 year old very pleasant female with pancreatic insufficiency secondary to chronic fatty replacement of pancreas unclear etiology MRCP negative for any neoplastic lesion.  She has history of cholelithiasis but no cholecystitis.  Doing well with Zenpep  60000 units, one with large meals.  Will continue.   IBS constipation and diarrhea:  Continue Miralax titrated prn.  GERD:  On omeprazole  20 mg daily, really takes it more so prn.    **New prescriptions for Zenpep  and omeprazole  sent to her pharmacy.   Return in 1 year or sooner if needed.   CC:  Felicita Horns, FNP

## 2024-02-13 ENCOUNTER — Encounter: Payer: Self-pay | Admitting: Family

## 2024-02-13 DIAGNOSIS — Z1231 Encounter for screening mammogram for malignant neoplasm of breast: Secondary | ICD-10-CM

## 2024-02-27 ENCOUNTER — Other Ambulatory Visit: Payer: Self-pay | Admitting: *Deleted

## 2024-02-27 DIAGNOSIS — J309 Allergic rhinitis, unspecified: Secondary | ICD-10-CM

## 2024-02-27 MED ORDER — FLUTICASONE PROPIONATE 50 MCG/ACT NA SUSP: 2.0000 | Freq: Every day | NASAL | 3 refills | Status: AC

## 2024-03-01 ENCOUNTER — Ambulatory Visit
Admission: RE | Admit: 2024-03-01 | Discharge: 2024-03-01 | Disposition: A | Discharge status: Home / Self Care | Source: Ambulatory Visit | Attending: Family | Admitting: Family

## 2024-03-01 DIAGNOSIS — Z1231 Encounter for screening mammogram for malignant neoplasm of breast: Secondary | ICD-10-CM | POA: Insufficient documentation

## 2024-03-03 ENCOUNTER — Ambulatory Visit: Payer: Self-pay | Admitting: Family

## 2024-03-08 ENCOUNTER — Encounter: Payer: Self-pay | Admitting: Family

## 2024-03-12 ENCOUNTER — Ambulatory Visit (INDEPENDENT_AMBULATORY_CARE_PROVIDER_SITE_OTHER): Admitting: Family Medicine

## 2024-03-12 ENCOUNTER — Ambulatory Visit: Admitting: Family Medicine

## 2024-03-12 VITALS — BP 114/68 | HR 83 | Temp 98.7°F | Resp 20 | Ht 65.0 in | Wt 156.4 lb

## 2024-03-12 DIAGNOSIS — R300 Dysuria: Secondary | ICD-10-CM

## 2024-03-12 LAB — POC URINALSYSI DIPSTICK (AUTOMATED)
Bilirubin, UA: NEGATIVE
Blood, UA: NEGATIVE
Glucose, UA: NEGATIVE
Ketones, UA: POSITIVE
Nitrite, UA: NEGATIVE
Protein, UA: POSITIVE — AB
Spec Grav, UA: 1.025 (ref 1.010–1.025)
Urobilinogen, UA: NEGATIVE U/dL — AB
pH, UA: 5.5 (ref 5.0–8.0)

## 2024-03-12 MED ORDER — SULFAMETHOXAZOLE-TRIMETHOPRIM 800-160 MG PO TABS: 1.0000 | ORAL_TABLET | Freq: Two times a day (BID) | ORAL | 0 refills | Status: DC

## 2024-03-12 NOTE — Progress Notes (Unsigned)
 She is off mounjaro  for the last week.    dysuria: yes, burning, frequency, incomplete voiding.  duration of symptoms: about 5 days.  abdominal pain:no fevers:no back pain:no vomiting:no Had been taking cystex.    Meds, vitals, and allergies reviewed.   Per HPI unless specifically indicated in ROS section   GEN: nad, alert and oriented HEENT: ncat NECK: supple CV: rrr.  PULM: ctab, no inc wob ABD: soft, +bs, suprapubic area tender EXT: no edema SKIN: no acute rash BACK: no CVA pain

## 2024-03-12 NOTE — Patient Instructions (Signed)
Drink plenty of water and start the antibiotics today.  We'll contact you with your lab report.  Take care.   

## 2024-03-13 LAB — URINE CULTURE
MICRO NUMBER:: 16634357
SPECIMEN QUALITY:: ADEQUATE

## 2024-03-14 ENCOUNTER — Ambulatory Visit: Payer: Self-pay | Admitting: Family Medicine

## 2024-03-14 DIAGNOSIS — R300 Dysuria: Secondary | ICD-10-CM | POA: Insufficient documentation

## 2024-03-14 NOTE — Assessment & Plan Note (Addendum)
 Likely cystitis.  Nontoxic.  Okay for outpatient follow-up.  Check urine culture.  Start Septra.  Drink plenty fluids and update us  as needed.  Routine cautions given to patient.  She agrees to plan.

## 2024-04-08 ENCOUNTER — Encounter: Payer: Self-pay | Admitting: Family

## 2024-04-12 ENCOUNTER — Other Ambulatory Visit: Payer: Self-pay | Admitting: Family

## 2024-04-12 DIAGNOSIS — F419 Anxiety disorder, unspecified: Secondary | ICD-10-CM

## 2024-04-14 DIAGNOSIS — M9901 Segmental and somatic dysfunction of cervical region: Secondary | ICD-10-CM | POA: Diagnosis not present

## 2024-04-14 DIAGNOSIS — M542 Cervicalgia: Secondary | ICD-10-CM | POA: Diagnosis not present

## 2024-04-14 DIAGNOSIS — M546 Pain in thoracic spine: Secondary | ICD-10-CM | POA: Diagnosis not present

## 2024-04-14 DIAGNOSIS — M9902 Segmental and somatic dysfunction of thoracic region: Secondary | ICD-10-CM | POA: Diagnosis not present

## 2024-04-19 ENCOUNTER — Ambulatory Visit (INDEPENDENT_AMBULATORY_CARE_PROVIDER_SITE_OTHER): Admitting: Family

## 2024-04-19 ENCOUNTER — Encounter: Payer: Self-pay | Admitting: Family

## 2024-04-19 VITALS — BP 128/84 | HR 76 | Temp 98.4°F | Ht 64.75 in | Wt 159.4 lb

## 2024-04-19 DIAGNOSIS — N814 Uterovaginal prolapse, unspecified: Secondary | ICD-10-CM | POA: Diagnosis not present

## 2024-04-19 DIAGNOSIS — N952 Postmenopausal atrophic vaginitis: Secondary | ICD-10-CM | POA: Insufficient documentation

## 2024-04-19 DIAGNOSIS — R300 Dysuria: Secondary | ICD-10-CM | POA: Diagnosis not present

## 2024-04-19 DIAGNOSIS — R82998 Other abnormal findings in urine: Secondary | ICD-10-CM | POA: Diagnosis not present

## 2024-04-19 DIAGNOSIS — N9489 Other specified conditions associated with female genital organs and menstrual cycle: Secondary | ICD-10-CM

## 2024-04-19 DIAGNOSIS — R3915 Urgency of urination: Secondary | ICD-10-CM | POA: Diagnosis not present

## 2024-04-19 LAB — POCT URINE DIPSTICK
Bilirubin, UA: NEGATIVE
Glucose, UA: NEGATIVE mg/dL
Ketones, POC UA: NEGATIVE mg/dL
Nitrite, UA: NEGATIVE
POC PROTEIN,UA: NEGATIVE
Spec Grav, UA: 1.015 (ref 1.010–1.025)
Urobilinogen, UA: 0.2 U/dL
pH, UA: 6 (ref 5.0–8.0)

## 2024-04-19 NOTE — Progress Notes (Signed)
 Established Patient Office Visit  Subjective:   Patient ID: [name], female    DOB: [birth year]  Age: 68 y.o. MRN: [id number]  CC: No chief complaint on file.   HPI: [name] is a 68 y.o. female presenting on 04/19/2024 for No chief complaint on file.  Seen in our office 6/27 for dysuria for about five days. Urine culture came back negative for infection. Had been given bactrim  without relief. She states she had slightr improvement for a short period but then back again with burning with urination and today states she has urinary urgency but also as if it 'burning from the inside out' . No visual vaginal rash. She states the burning inside is intermittent and chronic, sometimes worse than others.   She does state that during sex if she uses enough lubrication it will be ok but otherwise is painful. When she pees after sex there is some discomfort as well.   She was using cystex but stopped two days ago.   Did have sex two days ago.  Has tried otc replens doesn't remember if there was much relief.        ROS: Negative unless specifically indicated above in HPI.   Relevant past medical history reviewed and updated as indicated.   Allergies and medications reviewed and updated.   Current Outpatient Medications:    amLODipine  (NORVASC ) 5 MG tablet, TAKE 1 TABLET (5 MG TOTAL) BY MOUTH DAILY., Disp: 90 tablet, Rfl: 3   azelastine (ASTELIN) 0.1 % nasal spray, SPRAY 1-2 SPRAYS IN EACH NOSTRIL TWICE DAILY., Disp: , Rfl:    DULoxetine  (CYMBALTA ) 60 MG capsule, TAKE 1 CAPSULE BY MOUTH EVERY DAY, Disp: 90 capsule, Rfl: 1   Evolocumab  (REPATHA  SURECLICK) 140 MG/ML SOAJ, INJECT 1 ML SUBCUTANEOUSLY EVERY 2 WEEKS IN ABDOMEN,THIGH,OR OUTER AREA OF UPPER ARM (ROTATE SITES), Disp: 2 mL, Rfl: 5   fluticasone  (FLONASE ) 50 MCG/ACT nasal spray, Place 2 sprays into both nostrils daily., Disp: 48 mL, Rfl: 3   Multiple Vitamin (MULTIVITAMIN) tablet, Take 1 tablet by mouth daily., Disp: ,  Rfl:    omeprazole  (PRILOSEC) 20 MG capsule, Take 1 capsule (20 mg total) by mouth daily., Disp: 90 capsule, Rfl: 3   Pancrelipase , Lip-Prot-Amyl, (ZENPEP ) 60000-189600 units CPEP, Take 1 capsule by mouth 3 (three) times daily with meals., Disp: 270 capsule, Rfl: 3  Allergies  Allergen Reactions   Ezetimibe  Other (See Comments)    Fatigue and constipation   Hydroxyzine  Other (See Comments)    Drowsiness, excessive   Lisinopril  Other (See Comments)    Increased fatigue, sleeping more than needed   Statins Other (See Comments)    Muscle aches   Creon  [Pancrelipase  (Lip-Prot-Amyl)] Other (See Comments)    itching   Losartan  Other (See Comments)    Fatigue, malaise    Objective:   BP 128/84 (BP Location: Left Arm, Patient Position: Sitting, Cuff Size: Normal)   Pulse 76   Temp 98.4 F (36.9 C) (Temporal)   Ht 5' 4.75 (1.645 m)   Wt 159 lb 6.4 oz (72.3 kg)   SpO2 98%   BMI 26.73 kg/m    Physical Exam Vitals reviewed.  Genitourinary:    General: Normal vulva.     Urethra: Prolapse present.     Vagina: Lesions and prolapsed vaginal walls present. No vaginal discharge or tenderness.     Rectum: Normal.     Assessment & Plan:  Burning with urination -     POCT URINE DIPSTICK -  Ambulatory referral to Urogynecology  Leukocytes in urine -     Urinalysis w microscopic + reflex cultur  Vaginal burning -     WET PREP BY MOLECULAR PROBE  Cystocele with prolapse Assessment & Plan: Evidenced on physical exam.  Referral to urogyn Discussed could consider pelvic floor therapy   Orders: -     Ambulatory referral to Urogynecology  Urinary urgency  Vaginal atrophy Assessment & Plan: Replens try daily if no improvement consider intrarosa  Premarin  cx due to personal h/o breast cancer      Follow up plan: Return if symptoms worsen or fail to improve.  Ginger Patrick, FNP

## 2024-04-19 NOTE — Assessment & Plan Note (Signed)
 Evidenced on physical exam.  Referral to urogyn Discussed could consider pelvic floor therapy

## 2024-04-19 NOTE — Assessment & Plan Note (Signed)
 Replens try daily if no improvement consider intrarosa  Premarin  cx due to personal h/o breast cancer

## 2024-04-20 ENCOUNTER — Ambulatory Visit: Payer: Self-pay | Admitting: Family

## 2024-04-20 DIAGNOSIS — B9689 Other specified bacterial agents as the cause of diseases classified elsewhere: Secondary | ICD-10-CM | POA: Insufficient documentation

## 2024-04-20 LAB — WET PREP BY MOLECULAR PROBE
Candida species: NOT DETECTED
MICRO NUMBER:: 16782979
SPECIMEN QUALITY:: ADEQUATE
Trichomonas vaginosis: NOT DETECTED

## 2024-04-20 MED ORDER — METRONIDAZOLE 500 MG PO TABS
500.0000 mg | ORAL_TABLET | Freq: Two times a day (BID) | ORAL | 0 refills | Status: AC
Start: 2024-04-20 — End: 2024-04-27

## 2024-04-21 LAB — URINE CULTURE
MICRO NUMBER:: 16785818
SPECIMEN QUALITY:: ADEQUATE

## 2024-04-21 LAB — URINALYSIS W MICROSCOPIC + REFLEX CULTURE
Bilirubin Urine: NEGATIVE
Glucose, UA: NEGATIVE
Hyaline Cast: NONE SEEN /LPF
Ketones, ur: NEGATIVE
Nitrites, Initial: NEGATIVE
Protein, ur: NEGATIVE
Specific Gravity, Urine: 1.015 (ref 1.001–1.035)
WBC, UA: NONE SEEN /HPF (ref 0–5)
pH: 6.5 (ref 5.0–8.0)

## 2024-04-21 LAB — CULTURE INDICATED

## 2024-06-02 DIAGNOSIS — L03032 Cellulitis of left toe: Secondary | ICD-10-CM | POA: Diagnosis not present

## 2024-06-16 DIAGNOSIS — L03032 Cellulitis of left toe: Secondary | ICD-10-CM | POA: Diagnosis not present

## 2024-06-28 ENCOUNTER — Encounter: Payer: Self-pay | Admitting: Family

## 2024-07-01 ENCOUNTER — Encounter: Payer: Self-pay | Admitting: Pharmacist

## 2024-07-01 NOTE — Progress Notes (Signed)
 Pharmacy Quality Measure Review  This patient is appearing on a report for being at risk of failing the adherence measure for diabetes medications this calendar year.   Medication: Mounjaro  5 mg Last fill date: 01/12/24 for 84 day supply  No longer prescribed. Patient has stopped taking.  No further action.

## 2024-07-06 ENCOUNTER — Ambulatory Visit: Admitting: Family

## 2024-07-06 ENCOUNTER — Encounter: Payer: Self-pay | Admitting: Family

## 2024-07-06 VITALS — BP 132/80 | HR 105 | Temp 98.3°F | Ht 64.75 in | Wt 160.0 lb

## 2024-07-06 DIAGNOSIS — F5104 Psychophysiologic insomnia: Secondary | ICD-10-CM

## 2024-07-06 DIAGNOSIS — F419 Anxiety disorder, unspecified: Secondary | ICD-10-CM

## 2024-07-06 DIAGNOSIS — F411 Generalized anxiety disorder: Secondary | ICD-10-CM | POA: Diagnosis not present

## 2024-07-06 DIAGNOSIS — F418 Other specified anxiety disorders: Secondary | ICD-10-CM | POA: Diagnosis not present

## 2024-07-06 MED ORDER — DULOXETINE HCL 60 MG PO CPEP: ORAL_CAPSULE | ORAL | Status: DC

## 2024-07-06 MED ORDER — DULOXETINE HCL 30 MG PO CPEP: ORAL_CAPSULE | ORAL | 1 refills | Status: DC

## 2024-07-06 MED ORDER — TRAZODONE HCL 50 MG PO TABS: 25.0000 mg | ORAL_TABLET | Freq: Every evening | ORAL | 3 refills | Status: DC | PRN

## 2024-07-06 NOTE — Progress Notes (Signed)
 Established Patient Office Visit  Subjective:      CC:  Chief Complaint  Patient presents with   Medical Management of Chronic Issues    HPI: [name] is a 68 y.o. female presenting on 07/06/2024 for Medical Management of Chronic Issues .  Discussed the use of AI scribe software for clinical note transcription with the patient, who gave verbal consent to proceed.  History of Present Illness [name] is a 68 year old female who presents with stress and sleep disturbances.  She is experiencing significant stress and emotional turmoil due to multiple family-related issues, including her mother's declining health and her grandson leaving for college. Her daughter's unstable situation, including a recent hospitalization for an infection and ongoing personal issues, adds to her stress. She feels overwhelmed by the demands placed on her by family members, including her daughter and her mother, who frequently calls her due to dementia-related paranoia.  She is currently taking Cymbalta  (duloxetine ) 60 mg daily for her symptoms, which has been helpful in the past. She reports waking up at night and not sleeping well, contributing to her fatigue and stress.  She is retired but remains very active, often feeling overwhelmed by family responsibilities. She struggles with setting boundaries and taking time for herself. She has a supportive relationship with her husband, Alm, but notes that he is also affected by the family dynamics, particularly concerning their daughter Warren.  In the review of symptoms, she reports difficulty sleeping, waking up multiple times during the night, and feeling tired during the day. No glaucoma.         Social history:  Relevant past medical, surgical, family and social history reviewed and updated as indicated. Interim medical history since our last visit reviewed.  Allergies and medications reviewed and updated.  DATA REVIEWED: CHART IN  EPIC     ROS: Negative unless specifically indicated above in HPI.    Current Outpatient Medications:    amLODipine  (NORVASC ) 5 MG tablet, TAKE 1 TABLET (5 MG TOTAL) BY MOUTH DAILY., Disp: 90 tablet, Rfl: 3   azelastine (ASTELIN) 0.1 % nasal spray, SPRAY 1-2 SPRAYS IN EACH NOSTRIL TWICE DAILY., Disp: , Rfl:    DULoxetine  (CYMBALTA ) 30 MG capsule, Take one 30 mg tablet along with 60 mg tablet once daily (for total of 90 mg once a day), Disp: 30 capsule, Rfl: 1   Evolocumab  (REPATHA  SURECLICK) 140 MG/ML SOAJ, INJECT 1 ML SUBCUTANEOUSLY EVERY 2 WEEKS IN ABDOMEN,THIGH,OR OUTER AREA OF UPPER ARM (ROTATE SITES), Disp: 2 mL, Rfl: 5   fluticasone  (FLONASE ) 50 MCG/ACT nasal spray, Place 2 sprays into both nostrils daily., Disp: 48 mL, Rfl: 3   Multiple Vitamin (MULTIVITAMIN) tablet, Take 1 tablet by mouth daily., Disp: , Rfl:    omeprazole  (PRILOSEC) 20 MG capsule, Take 1 capsule (20 mg total) by mouth daily., Disp: 90 capsule, Rfl: 3   Pancrelipase , Lip-Prot-Amyl, (ZENPEP ) 60000-189600 units CPEP, Take 1 capsule by mouth 3 (three) times daily with meals., Disp: 270 capsule, Rfl: 3   traZODone (DESYREL) 50 MG tablet, Take 0.5-1 tablets (25-50 mg total) by mouth at bedtime as needed for sleep., Disp: 30 tablet, Rfl: 3   DULoxetine  (CYMBALTA ) 60 MG capsule, Take one 60 mg tablet along with one 30 mg tablet for total of 90 mg once daily, Disp: , Rfl:         Objective:        BP 132/80 (BP Location: Left Arm, Patient Position: Sitting, Cuff Size:  Large)   Pulse (!) 105   Temp 98.3 F (36.8 C) (Temporal)   Ht 5' 4.75 (1.645 m)   Wt 160 lb (72.6 kg)   SpO2 96%   BMI 26.83 kg/m   Physical Exam   Wt Readings from Last 3 Encounters:  07/06/24 160 lb (72.6 kg)  04/19/24 159 lb 6.4 oz (72.3 kg)  03/12/24 156 lb 6 oz (70.9 kg)    Physical Exam Vitals reviewed.  Constitutional:      General: She is not in acute distress.    Appearance: Normal appearance. She is normal weight. She is  not ill-appearing, toxic-appearing or diaphoretic.  HENT:     Head: Normocephalic.  Cardiovascular:     Rate and Rhythm: Normal rate and regular rhythm.  Pulmonary:     Effort: Pulmonary effort is normal.  Musculoskeletal:        General: Normal range of motion.  Neurological:     General: No focal deficit present.     Mental Status: She is alert and oriented to person, place, and time. Mental status is at baseline.  Psychiatric:        Attention and Perception: Attention and perception normal.        Mood and Affect: Mood and affect normal.        Speech: Speech normal.        Behavior: Behavior normal. Behavior is cooperative.        Thought Content: Thought content normal. Thought content does not include homicidal or suicidal ideation. Thought content does not include homicidal or suicidal plan.        Judgment: Judgment normal.             Results   Assessment & Plan:   Assessment and Plan Assessment & Plan Major depressive disorder and anxiety symptoms Experiencing increased stress and anxiety due to family dynamics and personal responsibilities. Currently on duloxetine  60 mg daily, which has been effective in the past. Considering increasing the dose to 90 mg to manage heightened stress and nervousness. Discussed potential benefits and risks of increasing duloxetine  to 90 mg. She is agreeable to trying the increased dose if needed. - Increase duloxetine  to 90 mg if needed, adding a 30 mg tablet to the current 60 mg dose. - Refer to therapy for boundary setting and stress management. Provide referral to [facility] and [facility] for therapy options.  Insomnia Experiencing difficulty staying asleep, waking up multiple times during the night, contributing to increased anxiety and fatigue. Discussed the use of trazodone for sleep, noting it is generally well-tolerated but can cause morning fogginess. She prefers to start at a lower dose due to sensitivity to medications. -  Prescribe trazodone 25-50 mg at night as needed for sleep. - Advise starting with 25 mg and increasing to 50 mg if needed. - Monitor for side effects such as morning fogginess. - Evaluate the effectiveness of trazodone on sleep before increasing duloxetine .  General Health Maintenance Discussed the importance of scheduling rest and self-care activities to manage stress and improve overall well-being. - Encourage scheduling regular rest periods and engaging in relaxing activities such as reading or watching TV.  Follow-Up Plan to reassess the effectiveness of medication changes and therapy referral in six weeks. - Schedule follow-up appointment in six weeks to evaluate response to medication changes and therapy.        Return in about 6 weeks (around 08/17/2024) for f/u depression.     Ginger Patrick, MSN, APRN, FNP-C   [facility]

## 2024-07-06 NOTE — Patient Instructions (Signed)
  Call [facility] and see if Leah is taking new patients In general the office is really good:   [facility]  [name] tel: [phone]

## 2024-07-13 ENCOUNTER — Encounter: Payer: Self-pay | Admitting: Family

## 2024-07-14 ENCOUNTER — Ambulatory Visit: Payer: Self-pay

## 2024-07-14 NOTE — Telephone Encounter (Signed)
 FYI Only or Action Required?: FYI only for provider: appointment scheduled on 08/15/2024.  Patient was last seen in primary care on 07/06/2024 by Corwin Antu, FNP.  Called Nurse Triage reporting Cough.  Symptoms began several days ago.  Interventions attempted: OTC medications: Mucinex.  Symptoms are: gradually worsening.  Triage Disposition: See Physician Within 24 Hours  Patient/caregiver understands and will follow disposition?: Yes     Copied from CRM [id number]. Topic: Clinical - Red Word Triage >> Jul 14, 2024  9:59 AM Nessti S wrote: [facility] that prompted transfer to Nurse Triage: coughing up green phlegm   ----------------------------------------------------------------------- From previous Reason for Contact - Scheduling: Patient/patient representative is calling to schedule an appointment. Refer to attachments for appointment information. Reason for Disposition  [1] Continuous (nonstop) coughing interferes with work or school AND [2] no improvement using cough treatment per Care Advice  Answer Assessment - Initial Assessment Questions 1. ONSET: When did the cough begin?      6 days ago  2. SEVERITY: How bad is the cough today?      Moderate  3. SPUTUM: Describe the color of your sputum (e.g., none, dry cough; clear, white, yellow, green)     Green  4. HEMOPTYSIS: Are you coughing up any blood? If Yes, ask: How much? (e.g., flecks, streaks, tablespoons, etc.)     Denies  5. DIFFICULTY BREATHING: Are you having difficulty breathing? If Yes, ask: How bad is it? (e.g., mild, moderate, severe)      Denies  6. FEVER: Do you have a fever? If Yes, ask: What is your temperature, how was it measured, and when did it start?     Denies  7. CARDIAC HISTORY: Do you have any history of heart disease? (e.g., heart attack, congestive heart failure)      Denies  8. LUNG HISTORY: Do you have any history of lung disease?  (e.g., pulmonary embolus, asthma,  emphysema)     Denies  9. PE RISK FACTORS: Do you have a history of blood clots? (or: recent major surgery, recent prolonged travel, bedridden)     Denies  10. OTHER SYMPTOMS: Do you have any other symptoms? (e.g., runny nose, wheezing, chest pain)       Headache, mild chest discomfort   Currently taking Mucinex sinus and cough for symptoms. Symptoms are worse when waking in the morning and at night.  Protocols used: Cough - Acute Productive-A-AH

## 2024-07-15 ENCOUNTER — Ambulatory Visit: Admitting: Family Medicine

## 2024-07-15 ENCOUNTER — Encounter: Payer: Self-pay | Admitting: Family Medicine

## 2024-07-15 VITALS — BP 134/76 | HR 91 | Temp 98.0°F | Ht 64.75 in | Wt 159.5 lb

## 2024-07-15 DIAGNOSIS — R051 Acute cough: Secondary | ICD-10-CM | POA: Diagnosis not present

## 2024-07-15 LAB — POC COVID19 BINAXNOW: SARS Coronavirus 2 Ag: NEGATIVE

## 2024-07-15 MED ORDER — PREDNISONE 20 MG PO TABS: ORAL_TABLET | ORAL | 0 refills | Status: DC

## 2024-07-15 MED ORDER — AMOXICILLIN 500 MG PO CAPS: 1000.0000 mg | ORAL_CAPSULE | Freq: Two times a day (BID) | ORAL | 0 refills | Status: DC

## 2024-07-15 NOTE — Assessment & Plan Note (Signed)
 Acute, likely initial viral URI now with bacterial superinfection.   Will treat with antibiotics and prednisone  course.   Amoxicillin  1000 mg BID x 10 days  Continue mucinex and add nasal saline.  Return ER precautions provided.

## 2024-07-15 NOTE — Progress Notes (Signed)
 Patient ID: [name], female    DOB: 1956/09/15, 68 y.o.   MRN: [id number]  This visit was conducted in person.  BP 134/76   Pulse 91   Temp 98 F (36.7 C) (Oral)   Ht 5' 4.75 (1.645 m)   Wt 159 lb 8 oz (72.3 kg)   SpO2 95%   BMI 26.75 kg/m    CC:  Chief Complaint  Patient presents with   Cough    C/o prod cough- green mucus, nasal drainage, fatigue and sinus pressure. Started 07/09/24. Tried Mucinex.    Subjective:   HPI: [name] is a 68 y.o. female presenting on 07/15/2024 for Cough (C/o prod cough- green mucus, nasal drainage, fatigue and sinus pressure. Started 07/09/24. Tried Mucinex.)   Date of onset:  07/09/2024 Initial symptoms included  cough Symptoms progressed to worsened cough  Cough keeping her up at night.  Now cough productive green sputum.  No fever, no body aches.  Decreased appetite.  Chest soreness with coughing, chest tightness.  Mild SOB, no wheeze.  Right ear tender off and on.  Headache, frontal face pain   Sick contacts:  family member with cough COVID testing:   none     She has tried to treat with  mucinex, robitussin     No history of chronic lung disease such as asthma or COPD. Non-smoker.      Relevant past medical, surgical, family and social history reviewed and updated as indicated. Interim medical history since our last visit reviewed. Allergies and medications reviewed and updated. Outpatient Medications Prior to Visit  Medication Sig Dispense Refill   amLODipine  (NORVASC ) 5 MG tablet TAKE 1 TABLET (5 MG TOTAL) BY MOUTH DAILY. 90 tablet 3   azelastine (ASTELIN) 0.1 % nasal spray SPRAY 1-2 SPRAYS IN EACH NOSTRIL TWICE DAILY.     DULoxetine  (CYMBALTA ) 60 MG capsule Take one 60 mg tablet along with one 30 mg tablet for total of 90 mg once daily     Evolocumab  (REPATHA  SURECLICK) 140 MG/ML SOAJ INJECT 1 ML SUBCUTANEOUSLY EVERY 2 WEEKS IN ABDOMEN,THIGH,OR OUTER AREA OF UPPER ARM (ROTATE SITES) 2 mL 5    fluticasone  (FLONASE ) 50 MCG/ACT nasal spray Place 2 sprays into both nostrils daily. 48 mL 3   Multiple Vitamin (MULTIVITAMIN) tablet Take 1 tablet by mouth daily.     omeprazole  (PRILOSEC) 20 MG capsule Take 1 capsule (20 mg total) by mouth daily. 90 capsule 3   Pancrelipase , Lip-Prot-Amyl, (ZENPEP ) 60000-189600 units CPEP Take 1 capsule by mouth 3 (three) times daily with meals. 270 capsule 3   DULoxetine  (CYMBALTA ) 30 MG capsule Take one 30 mg tablet along with 60 mg tablet once daily (for total of 90 mg once a day) (Patient not taking: Reported on 07/15/2024) 30 capsule 1   traZODone (DESYREL) 50 MG tablet Take 0.5-1 tablets (25-50 mg total) by mouth at bedtime as needed for sleep. (Patient not taking: Reported on 07/15/2024) 30 tablet 3   No facility-administered medications prior to visit.     Per HPI unless specifically indicated in ROS section below Review of Systems  Constitutional:  Positive for fatigue. Negative for fever.  HENT:  Positive for congestion, ear pain, postnasal drip, sinus pressure and sore throat.   Eyes:  Negative for pain.  Respiratory:  Positive for cough and shortness of breath.   Cardiovascular:  Negative for chest pain, palpitations and leg swelling.  Gastrointestinal:  Negative for abdominal pain.  Genitourinary:  Negative  for dysuria and vaginal bleeding.  Musculoskeletal:  Negative for back pain.  Neurological:  Negative for syncope, light-headedness and headaches.  Psychiatric/Behavioral:  Negative for dysphoric mood.   She Objective:  BP 134/76   Pulse 91   Temp 98 F (36.7 C) (Oral)   Ht 5' 4.75 (1.645 m)   Wt 159 lb 8 oz (72.3 kg)   SpO2 95%   BMI 26.75 kg/m   Wt Readings from Last 3 Encounters:  07/15/24 159 lb 8 oz (72.3 kg)  07/06/24 160 lb (72.6 kg)  04/19/24 159 lb 6.4 oz (72.3 kg)      Physical Exam Constitutional:      General: She is not in acute distress.    Appearance: She is well-developed. She is not ill-appearing or  toxic-appearing.  HENT:     Head: Normocephalic.     Right Ear: Hearing, tympanic membrane, ear canal and external ear normal. Tympanic membrane is not erythematous, retracted or bulging.     Left Ear: Hearing, tympanic membrane, ear canal and external ear normal. Tympanic membrane is not erythematous, retracted or bulging.     Nose: Mucosal edema and rhinorrhea present.     Right Sinus: No maxillary sinus tenderness or frontal sinus tenderness.     Left Sinus: No maxillary sinus tenderness or frontal sinus tenderness.     Mouth/Throat:     Pharynx: Uvula midline.  Eyes:     General: Lids are normal. Lids are everted, no foreign bodies appreciated.     Conjunctiva/sclera: Conjunctivae normal.     Pupils: Pupils are equal, round, and reactive to light.  Neck:     Thyroid : No thyroid  mass or thyromegaly.     Vascular: No carotid bruit.     Trachea: Trachea normal.  Cardiovascular:     Rate and Rhythm: Normal rate and regular rhythm.     Pulses: Normal pulses.     Heart sounds: Normal heart sounds, S1 normal and S2 normal. No murmur heard.    No friction rub. No gallop.  Pulmonary:     Effort: Pulmonary effort is normal. No tachypnea or respiratory distress.     Breath sounds: Normal breath sounds. No decreased breath sounds, wheezing, rhonchi or rales.  Musculoskeletal:     Cervical back: Normal range of motion and neck supple.  Skin:    General: Skin is warm and dry.     Findings: No rash.  Neurological:     Mental Status: She is alert.  Psychiatric:        Mood and Affect: Mood is not anxious or depressed.        Speech: Speech normal.        Behavior: Behavior normal. Behavior is cooperative.        Judgment: Judgment normal.       Results for orders placed or performed in visit on 07/15/24  POC COVID-19 BinaxNow   Collection Time: 07/15/24  2:31 PM  Result Value Ref Range   SARS Coronavirus 2 Ag Negative Negative    Assessment and Plan  Acute cough Assessment &  Plan:  Acute, likely initial viral URI now with bacterial superinfection.   Will treat with antibiotics and prednisone  course.   Amoxicillin  1000 mg BID x 10 days  Continue mucinex and add nasal saline.  Return ER precautions provided.  Orders: -     POC COVID-19 BinaxNow  Other orders -     predniSONE ; 3 tabs by mouth daily x 3 days, then  2 tabs by mouth daily x 2 days then 1 tab by mouth daily x 2 days  Dispense: 15 tablet; Refill: 0 -     Amoxicillin ; Take 2 capsules (1,000 mg total) by mouth 2 (two) times daily.  Dispense: 40 capsule; Refill: 0    No follow-ups on file.   Greig Ring, MD

## 2024-07-19 ENCOUNTER — Encounter: Payer: Self-pay | Admitting: Family

## 2024-07-19 NOTE — Telephone Encounter (Signed)
 Have you heard anything about an amlodipine  recall?

## 2024-07-22 ENCOUNTER — Encounter: Payer: Self-pay | Admitting: Family

## 2024-07-26 ENCOUNTER — Ambulatory Visit: Admitting: Family

## 2024-07-31 ENCOUNTER — Other Ambulatory Visit: Payer: Self-pay | Admitting: Family

## 2024-07-31 DIAGNOSIS — F418 Other specified anxiety disorders: Secondary | ICD-10-CM

## 2024-08-04 ENCOUNTER — Ambulatory Visit: Admitting: Obstetrics and Gynecology

## 2024-08-04 ENCOUNTER — Encounter: Payer: Self-pay | Admitting: Obstetrics and Gynecology

## 2024-08-04 VITALS — BP 142/82 | HR 86 | Ht 64.75 in | Wt 159.0 lb

## 2024-08-04 DIAGNOSIS — N393 Stress incontinence (female) (male): Secondary | ICD-10-CM

## 2024-08-04 DIAGNOSIS — N8110 Cystocele, unspecified: Secondary | ICD-10-CM

## 2024-08-04 DIAGNOSIS — N952 Postmenopausal atrophic vaginitis: Secondary | ICD-10-CM | POA: Diagnosis not present

## 2024-08-04 DIAGNOSIS — M62838 Other muscle spasm: Secondary | ICD-10-CM | POA: Diagnosis not present

## 2024-08-04 DIAGNOSIS — R350 Frequency of micturition: Secondary | ICD-10-CM | POA: Diagnosis not present

## 2024-08-04 DIAGNOSIS — N3281 Overactive bladder: Secondary | ICD-10-CM

## 2024-08-04 LAB — POC URINALSYSI DIPSTICK (AUTOMATED)
Bilirubin, UA: NEGATIVE
Glucose, UA: NEGATIVE
Ketones, UA: NEGATIVE
Nitrite, UA: NEGATIVE
Protein, UA: NEGATIVE
Spec Grav, UA: 1.015 (ref 1.010–1.025)
Urobilinogen, UA: 1 U/dL
pH, UA: 7 (ref 5.0–8.0)

## 2024-08-04 MED ORDER — CYCLOBENZAPRINE HCL 5 MG PO TABS: 5.0000 mg | ORAL_TABLET | Freq: Three times a day (TID) | ORAL | 1 refills | Status: AC | PRN

## 2024-08-04 MED ORDER — ESTRADIOL 0.01 % VA CREA: 0.5000 g | TOPICAL_CREAM | VAGINAL | 11 refills | Status: AC

## 2024-08-04 NOTE — Patient Instructions (Addendum)
 Today we talked about ways to manage bladder urgency such as altering your diet to avoid irritative beverages and foods (bladder diet) as well as attempting to decrease stress and other exacerbating factors.  You can also chew a plain Tums 1-3 times per day to make your urine less acidic, especially if you have eating/drinking acidic things.   There is a website with helpful information for people with bladder irritation, called the IC Network at https://www.ic-network.com. This website has more information about a healthy bladder diet and patient forums for support.  The Most Bothersome Foods* The Least Bothersome Foods*  Coffee - Regular & Decaf Tea - caffeinated Carbonated beverages - cola, non-colas, diet & caffeine-free Alcohols - Beer, Red Wine, White Wine, [address] - Grapefruit, [place], Orange, [employer] - Cranberry, Grapefruit, Orange, Pineapple Vegetables - Tomato & Tomato Products Flavor Enhancers - Hot peppers, Spicy foods, Chili, Horseradish, Vinegar, Monosodium glutamate (MSG) Artificial Sweeteners - NutraSweet, Sweet 'N Low, Equal (sweetener), Saccharin Ethnic foods - Mexican, Thai, Indian food [employer] - low-fat & whole Fruits - Bananas, Blueberries, Honeydew melon, Pears, Raisins, Watermelon Vegetables - Broccoli, [address] Sprouts, [place], Carrots, Cauliflower, [place], Cucumber, Mushrooms, Peas, Radishes, Squash, Zucchini, White potatoes, Sweet potatoes & yams Poultry - Chicken, Eggs, Turkey, [employer] - Beef, [employer], Lamb Seafood - Shrimp, [place] fish, Salmon Grains - Oat, Rice Snacks - Pretzels, Popcorn  *Mitch ALF et al. Diet and its role in interstitial cystitis/bladder pain syndrome (IC/BPS) and comorbid conditions. [employer]. BJU Int. 2012 Jan 11.    Please use the estrogen cream nightly for the first two weeks and then after that use it twice a week. Please use the lubrication for intercourse that is silicon blend or base.   Consider the  option of urethral bulking for the leakage with cough and sneeze.   You have significant pelvic floor muscle tension. Please start pelvic floor PT. Take the muscle relaxant for pelvic floor tension prior to intercourse.

## 2024-08-04 NOTE — Progress Notes (Unsigned)
 [facility] New Patient Evaluation and Consultation  Referring Provider: Corwin Antu, FNP PCP: Corwin Antu, FNP Date of Service: 08/04/2024  SUBJECTIVE Chief Complaint: Establish Care (Cystocele with prolapse)  History of Present Illness: [name] is a 68 y.o. White or Caucasian female seen in consultation at the request of NP Dugal for evaluation of prolapse.    Review of records significant for: Hx of breast cancer  Urinary Symptoms: Leaks urine with cough/ sneeze, laughing, exercise, lifting, going from sitting to standing, during sex, with a full bladder, and with movement to the bathroom Leaks 3 time(s) per days.  Pad use: 3 liners/ mini-pads per day.   Patient is bothered by UI symptoms.  Day time voids 5.  Nocturia: 1 times per night to void. Voiding dysfunction:  does not empty bladder well.  Patient does not use a catheter to empty bladder.  When urinating, patient feels dribbling after finishing Drinks: Drinks water (40oz), coffee(16oz), tea (16oz) per day  UTIs: 4 UTI's in the last year.   Reports history of blood in urine No results found for the last 90 days.   Pelvic Organ Prolapse Symptoms:                  Patient Admits to a feeling of a bulge the vaginal area. It has been present for 2 years.  Patient Denies seeing a bulge.  This bulge is bothersome.  Bowel Symptom: Bowel movements: 1 time(s) per day Stool consistency: soft  Straining: yes.  Splinting: yes.  Incomplete evacuation: yes.  Patient Denies accidental bowel leakage / fecal incontinence Bowel regimen: fiber and miralax Last colonoscopy: Date 03/06/22, Results 1 polyp HM Colonoscopy          Upcoming     Colonoscopy (Every 5 Years) Next due on 03/07/2027    03/06/2022  COLONOSCOPY   Only the first 1 history entries have been loaded, but more history exists.                Sexual Function Sexually active: yes.  Sexual orientation: Straight Pain with  sex: Yes, deep in the pelvis, has discomfort due to dryness  Pelvic Pain Denies pelvic pain    Past Medical History:  Past Medical History:  Diagnosis Date   Anemia    Anxiety    Arthritis    Blood transfusion without reported diagnosis    Breast cancer (HCC) 02/27/2007   Right   COVID-19 08/30/2020   GERD (gastroesophageal reflux disease)    Hypercholesterolemia    Hypertension    Migraine    Hx of in past   Personal history of radiation therapy 2008   Right   Wears dentures    partial upper and lower     Past Surgical History:   Past Surgical History:  Procedure Laterality Date   ABDOMINAL HYSTERECTOMY  1983   BREAST BIOPSY Right 02/27/2007   BREAST LUMPECTOMY Right 04/02/2007   BREAST SURGERY Left 04/02/2007   breast reduction left side   BROW LIFT Bilateral 06/17/2017   Procedure: BLEPHAROPLASTY UPPER EYELID WITH EXCESS SKIN;  Surgeon: Ashley Greig HERO, MD;  Location: [facility];  Service: Ophthalmology;  Laterality: Bilateral;   BROW LIFT Bilateral 11/30/2021   Procedure: BROW PTOSIS REPAIR BILATERAL;  Surgeon: Ashley Greig HERO, MD;  Location: [facility];  Service: Ophthalmology;  Laterality: Bilateral;   CESAREAN SECTION     two times   COLONOSCOPY     COSMETIC SURGERY  Breast Left side reduction   EYE SURGERY  2020   Saggy skin enterferring with sight   FOOT SURGERY Right    PTOSIS REPAIR Bilateral 06/17/2017   Procedure: PTOSIS REPAIR RESECT EX;  Surgeon: Ashley Greig HERO, MD;  Location: [facility];  Service: Ophthalmology;  Laterality: Bilateral;   REDUCTION MAMMAPLASTY Left    SHOULDER ARTHROSCOPY WITH ROTATOR CUFF REPAIR AND OPEN BICEPS TENODESIS Right 02/01/2019   Procedure: RIGHT SHOULDER ARTHROSCOPY, DEBRIDEMENT, BICEPS TENODESIS, MINI ROTATOR CUFF TEAR REPAIR;  Surgeon: Addie Cordella Hamilton, MD;  Location: [facility];  Service: Orthopedics;  Laterality: Right;   TUBAL LIGATION  1981     Past OB/GYN  History:  [id number] Vaginal deliveries: 0,  Forceps/ Vacuum deliveries: 0, Cesarean section: 2 Menopausal: Yes, at age 33 Contraception: Hyst. Last pap smear was 1980.  Any history of abnormal pap smears: no. HM PAP   This patient has no relevant Health Maintenance data.     Medications: Patient has a current medication list which includes the following prescription(s): amlodipine , azelastine, cyclobenzaprine , duloxetine , estradiol , repatha  sureclick, fluticasone , multivitamin, omeprazole , and zenpep .   Allergies: Patient is allergic to ezetimibe , hydroxyzine , lisinopril , statins, trazodone and nefazodone, creon  [pancrelipase  (lip-prot-amyl)], and losartan .   Social History:  Social History   Tobacco Use   Smoking status: Never   Smokeless tobacco: Never  Vaping Use   Vaping status: Never Used  Substance Use Topics   Alcohol use: Not Currently   Drug use: No    Relationship status: married Patient lives with husband.   Patient is not employed . Regular exercise: Yes: Walking History of abuse: No  Family History:   Family History  Problem Relation Age of Onset   Thyroid  disease Mother    Hypertension Mother    Hyperlipidemia Mother    Alzheimer's disease Mother    Heart failure Mother    ADD / ADHD Mother    Anxiety disorder Mother    Depression Mother    Kidney disease Mother    Stroke Mother    Varicose Veins Mother    Esophageal cancer Father    Hyperlipidemia Father    Heart murmur Father    Colon polyps Father    COPD Father    Aneurysm Father    Throat cancer Father    Diabetes Sister    Arthritis Sister    Depression Sister    Obesity Sister    Varicose Veins Sister    Breast cancer Sister    Asthma Sister    Cancer Sister    Irritable bowel syndrome Sister    Breast cancer Sister    Ulcerative colitis Sister    Rheum arthritis Sister    Alzheimer's disease Maternal Aunt    Alzheimer's disease Maternal Uncle    Diabetes Paternal Uncle     Alzheimer's disease Maternal Grandmother    Diabetes Paternal Grandmother    Stroke Paternal Grandmother    Breast cancer Cousin    Breast cancer Cousin    ADD / ADHD Daughter    ADD / ADHD Daughter    Alcohol abuse Brother    Cancer Sister    COPD Sister    Depression Sister    Obesity Sister    ADD / ADHD Daughter    ADD / ADHD Daughter    Alcohol abuse Brother    COPD Sister    Depression Sister    Obesity Sister    Colon cancer Neg Hx    Stomach  cancer Neg Hx    Rectal cancer Neg Hx      Review of Systems: Review of Systems  Constitutional:  Positive for malaise/fatigue. Negative for chills and fever.  Respiratory:  Positive for cough. Negative for shortness of breath.   Cardiovascular:  Negative for chest pain and palpitations.  Gastrointestinal:  Negative for abdominal pain, blood in stool, constipation and diarrhea.  Skin:  Negative for rash.  Neurological:  Positive for dizziness and headaches. Negative for weakness.  Endo/Heme/Allergies:  Bruises/bleeds easily.       +Hot Flashes  Psychiatric/Behavioral:  Negative for depression and suicidal ideas. The patient is nervous/anxious.      OBJECTIVE Physical Exam: Vitals:   08/04/24 1008  BP: (!) 142/82  Pulse: 86  Weight: 159 lb (72.1 kg)  Height: 5' 4.75 (1.645 m)    Physical Exam Vitals reviewed. Exam conducted with a chaperone present.  Constitutional:      Appearance: Normal appearance.  Pulmonary:     Effort: Pulmonary effort is normal.  Abdominal:     Palpations: Abdomen is soft.  Neurological:     General: No focal deficit present.     Mental Status: She is alert and oriented to person, place, and time.  Psychiatric:        Mood and Affect: Mood normal.        Behavior: Behavior normal. Behavior is cooperative.        Thought Content: Thought content normal.      GU / Detailed Urogynecologic Evaluation:  Pelvic Exam: Normal external female genitalia; Bartholin's and Skene's glands normal  in appearance; urethral meatus normal in appearance, no urethral masses or discharge.   CST: positive  s/p hysterectomy: Speculum exam reveals normal vaginal mucosa with  atrophy and normal vaginal cuff.  Adnexa normal adnexa.    With apex supported, anterior compartment defect was present  Pelvic floor strength II/V  Pelvic floor musculature: Right levator non-tender, Right obturator tender, Left levator tender, Left obturator tender  POP-Q:   POP-Q  -1                                            Aa   -1                                           Ba  -8                                              C   3.5                                            Gh  5                                            Pb  10  tvl   -2                                            Ap  -2                                            Bp                                                 D      Rectal Exam:  Normal external exam  Post-Void Residual (PVR) by Bladder Scan: In order to evaluate bladder emptying, we discussed obtaining a postvoid residual and patient agreed to this procedure.  Procedure: The ultrasound unit was placed on the patient's abdomen in the suprapubic region after the patient had voided.    Post Void Residual - 08/04/24 1037       Post Void Residual   Post Void Residual 1 mL           Laboratory Results: Lab Results  Component Value Date   COLORU yellow 08/04/2024   CLARITYU clear 08/04/2024   GLUCOSEUR Negative 08/04/2024   BILIRUBINUR neg 08/04/2024   KETONESU neg 08/04/2024   SPECGRAV 1.015 08/04/2024   RBCUR trace 08/04/2024   PHUR 7.0 08/04/2024   PROTEINUR Negative 08/04/2024   UROBILINOGEN 1.0 08/04/2024   LEUKOCYTESUR Small (1+) (A) 08/04/2024    Lab Results  Component Value Date   CREATININE 0.80 12/30/2023   CREATININE 0.78 10/22/2022   CREATININE 0.88 06/11/2022    Lab Results  Component Value  Date   HGBA1C 6.3 12/30/2023    Lab Results  Component Value Date   HGB 12.9 10/22/2022     ASSESSMENT AND PLAN [name] is a 68 y.o. with:  1. Urinary frequency   2. OAB (overactive bladder)   3. Levator spasm   4. SUI (stress urinary incontinence, female)   5. Vaginal atrophy   6. Prolapse of anterior vaginal wall    We discussed the symptoms of overactive bladder (OAB), which include urinary urgency, urinary frequency, nocturia, with or without urge incontinence.  While we do not know the exact etiology of OAB, several treatment options exist. We discussed management including behavioral therapy (decreasing bladder irritants, urge suppression strategies, timed voids, bladder retraining), physical therapy, medication; for refractory cases posterior tibial nerve stimulation, sacral neuromodulation, and intravesical botulinum toxin injection. For anticholinergic medications, we discussed the potential side effects of anticholinergics including dry eyes, dry mouth, constipation, cognitive impairment and urinary retention. For Beta-3 agonist medication, we discussed the potential side effect of elevated blood pressure which is more likely to occur in individuals with uncontrolled hypertension. For patient's OAB and levator spasm we will start with pelvic floor PT. Referral placed to [facility] re[facility] to do PT and see if this improves OAB and levator spasm.  Patient has pain with intercourse. Her muscles, especially at the obturator muscles are tender and tight. We discussed doing PT. Referral placed.  For patient's stress urinary incontinence we discussed doing a pessary, urethral bulking, or surgical sling. She is most interested in urethral bulking and would like to  read information. [employer] handout given to patient. She would like to plan to have this done next year.  Patient has vaginal atrophy on exam. She would benefit from estrogen cream. Patient to use a blueberry sized amount into the  vagina. She may use this nightly for 2 weeks and then twice weekly after. We discussed using her finger instead of using the applicator.  Based on patient's symptoms, I do not think her prolapse is the cause of her pain with intercourse. She is not heavily symptomatic of her prolapse. If she were to desire a surgical sling, she would most likely need an anterior repair, but otherwise at this time will plan for expectant management. We did discuss pessaries if she would like or she can follow up with a surgeon. Will do PT first and then can re-evaluate what she would like to do.   Patinet to follow up for Urethral bulking or sooner if needed.     Tabria Steines G Sanye Ledesma, NP

## 2024-08-05 ENCOUNTER — Encounter: Payer: Self-pay | Admitting: Obstetrics and Gynecology

## 2024-08-06 ENCOUNTER — Encounter: Payer: Self-pay | Admitting: Obstetrics and Gynecology

## 2024-08-17 ENCOUNTER — Ambulatory Visit: Admitting: Family

## 2024-08-24 ENCOUNTER — Ambulatory Visit: Admitting: Clinical

## 2024-08-24 DIAGNOSIS — F4323 Adjustment disorder with mixed anxiety and depressed mood: Secondary | ICD-10-CM
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
## 2024-08-24 NOTE — Progress Notes (Signed)
   Darice Seats, LCSW

## 2024-09-13 ENCOUNTER — Encounter: Payer: Self-pay | Admitting: Family

## 2024-09-13 ENCOUNTER — Encounter: Payer: Self-pay | Admitting: Obstetrics and Gynecology

## 2024-09-13 ENCOUNTER — Ambulatory Visit: Admitting: Family

## 2024-09-13 VITALS — BP 130/82 | HR 90 | Temp 98.3°F | Ht 64.75 in | Wt 161.6 lb

## 2024-09-13 DIAGNOSIS — R0989 Other specified symptoms and signs involving the circulatory and respiratory systems: Secondary | ICD-10-CM

## 2024-09-13 DIAGNOSIS — I10 Essential (primary) hypertension: Secondary | ICD-10-CM

## 2024-09-13 DIAGNOSIS — E119 Type 2 diabetes mellitus without complications: Secondary | ICD-10-CM

## 2024-09-13 DIAGNOSIS — Z20822 Contact with and (suspected) exposure to covid-19: Secondary | ICD-10-CM | POA: Diagnosis not present

## 2024-09-13 DIAGNOSIS — J0100 Acute maxillary sinusitis, unspecified: Secondary | ICD-10-CM | POA: Diagnosis not present

## 2024-09-13 DIAGNOSIS — G250 Essential tremor: Secondary | ICD-10-CM | POA: Insufficient documentation

## 2024-09-13 LAB — HEMOGLOBIN A1C: Hgb A1c MFr Bld: 6.1 % (ref 4.6–6.5)

## 2024-09-13 LAB — POCT INFLUENZA A/B
Influenza A, POC: NEGATIVE
Influenza B, POC: NEGATIVE

## 2024-09-13 LAB — TSH: TSH: 1.77 u[IU]/mL (ref 0.35–5.50)

## 2024-09-13 LAB — POC COVID19 BINAXNOW: SARS Coronavirus 2 Ag: NEGATIVE

## 2024-09-13 MED ORDER — DOXYCYCLINE HYCLATE 100 MG PO TABS: 100.0000 mg | ORAL_TABLET | Freq: Two times a day (BID) | ORAL | 0 refills | Status: AC

## 2024-09-13 MED ORDER — TIRZEPATIDE 2.5 MG/0.5ML ~~LOC~~ SOAJ: 2.5000 mg | SUBCUTANEOUS | 0 refills | Status: AC

## 2024-09-13 NOTE — Addendum Note (Signed)
 Addended by: CORWIN ANTU on: 09/13/2024 03:14 PM   Modules accepted: Orders

## 2024-09-13 NOTE — Progress Notes (Addendum)
 "  Established Patient Office Visit  Subjective:      CC:  Chief Complaint  Patient presents with   Acute Visit    Reports that she has been having tremors in her head.    HPI: [name] is a 68 y.o. female presenting on 09/13/2024 for Acute Visit (Reports that she has been having tremors in her head.) .  Discussed the use of AI scribe software for clinical note transcription with the patient, who gave verbal consent to proceed.  History of Present Illness [name] is a 68 year old female who presents with head tremors and memory concerns.  She has been experiencing head tremors for some time, which are more pronounced at night when lying down. The tremors occur when she is sitting still and involve her head moving back and forth. No other tremors are present in her body.  She participated in a dementia and Alzheimer's study about four years ago, which indicated she has two copies of a gene, unsure? Her family history includes dementia and Parkinson's disease, with her mother's family having a significant history of dementia. She reports memory issues, particularly with remembering names, but attributes this to stress.  She has been experiencing sinus headaches for over a week, which she attributes to weather changes. No fever, but she has had ear pain and tenderness in her sinuses. She uses Flonase  inconsistently for allergies.         Social history:  Relevant past medical, surgical, family and social history reviewed and updated as indicated. Interim medical history since our last visit reviewed.  Allergies and medications reviewed and updated.  DATA REVIEWED: CHART IN EPIC     ROS: Negative unless specifically indicated above in HPI.   Current Medications[1]        Objective:        BP 130/82 (BP Location: Left Arm, Patient Position: Sitting, Cuff Size: Normal)   Pulse 90   Temp 98.3 F (36.8 C) (Temporal)   Ht 5' 4.75 (1.645 m)   Wt  161 lb 9.6 oz (73.3 kg)   SpO2 97%   BMI 27.10 kg/m   Physical Exam NECK: Tenderness present. NEUROLOGICAL: Awake and alert, normal memory assessment, normal short-term memory recall, normal calculation ability.  Wt Readings from Last 3 Encounters:  09/13/24 161 lb 9.6 oz (73.3 kg)  08/04/24 159 lb (72.1 kg)  07/15/24 159 lb 8 oz (72.3 kg)    Physical Exam Vitals reviewed.  Constitutional:      General: She is not in acute distress.    Appearance: Normal appearance. She is normal weight. She is not ill-appearing, toxic-appearing or diaphoretic.  HENT:     Head: Normocephalic.     Right Ear: Tympanic membrane normal.     Left Ear: Tympanic membrane normal.     Nose:     Right Sinus: Maxillary sinus tenderness present.     Left Sinus: Maxillary sinus tenderness present.     Mouth/Throat:     Mouth: Mucous membranes are dry.     Pharynx: No oropharyngeal exudate or posterior oropharyngeal erythema.  Eyes:     Extraocular Movements: Extraocular movements intact.     Pupils: Pupils are equal, round, and reactive to light.  Cardiovascular:     Rate and Rhythm: Normal rate and regular rhythm.     Pulses: Normal pulses.     Heart sounds: Normal heart sounds.  Pulmonary:     Effort: Pulmonary effort is normal.  Breath sounds: Normal breath sounds.  Musculoskeletal:     Cervical back: Normal range of motion.  Neurological:     General: No focal deficit present.     Mental Status: She is alert and oriented to person, place, and time. Mental status is at baseline.     Cranial Nerves: Cranial nerves 2-12 are intact. No cranial nerve deficit or facial asymmetry.     Motor: Tremor (head) present. No pronator drift.     Coordination: Finger-Nose-Finger Test abnormal.  Psychiatric:        Mood and Affect: Mood normal.        Behavior: Behavior normal.        Thought Content: Thought content normal.        Judgment: Judgment normal.          Results   Assessment & Plan:    Assessment and Plan Assessment & Plan Essential tremor Intermittent head tremor, more pronounced when sitting still, with no associated tremors in other parts of the body. No shuffling gait. Family history of Parkinson's disease. Differential includes essential tremor and thyroid  dysfunction. Memory issues present, but cognitive testing shows normal results. Family history of dementia and Alzheimer's disease with E4 gene presence. - Referred to neurology for tremor evaluation and potential MRI. - Ordered thyroid  function tests.  Acute sinusitis Sinus headache persisting for over a week, with recent exacerbation. No fever, sore throat, or significant congestion. Mild tenderness in the sinus area. Symptoms possibly related to weather changes. - Test for COVID-19 and flu, both of which negative  - Advised regular use of Flonase  for allergy management. -RX doxycycline  100 mg po bid x 10 days  General health maintenance Discussion of family history of dementia and Alzheimer's disease. Previous study participation for dementia and Alzheimer's. - Continue regular follow-up with primary care for general health maintenance.  Controlled type 2 diabetes:  Ordering A1c today  Continue mounjaro  2.5 mg weekly      Return for as scheduled for CPE .     Ginger Patrick, MSN, APRN, FNP-C [facility]        [1]  Current Outpatient Medications:    amLODipine  (NORVASC ) 5 MG tablet, TAKE 1 TABLET (5 MG TOTAL) BY MOUTH DAILY., Disp: 90 tablet, Rfl: 3   azelastine (ASTELIN) 0.1 % nasal spray, SPRAY 1-2 SPRAYS IN EACH NOSTRIL TWICE DAILY., Disp: , Rfl:    cyclobenzaprine  (FLEXERIL ) 5 MG tablet, Take 1 tablet (5 mg total) by mouth 3 (three) times daily as needed for muscle spasms. Use 30 minutes prior to intercourse, Disp: 60 tablet, Rfl: 1   DULoxetine  (CYMBALTA ) 60 MG capsule, Take one 60 mg tablet along with one 30 mg tablet for total of 90 mg once daily, Disp: , Rfl:     estradiol  (ESTRACE ) 0.01 % CREA vaginal cream, Place 0.5 g vaginally 2 (two) times a week. Place 0.5g nightly for two weeks then twice a week after, Disp: 42 g, Rfl: 11   Evolocumab  (REPATHA  SURECLICK) 140 MG/ML SOAJ, INJECT 1 ML SUBCUTANEOUSLY EVERY 2 WEEKS IN ABDOMEN,THIGH,OR OUTER AREA OF UPPER ARM (ROTATE SITES), Disp: 2 mL, Rfl: 5   fluticasone  (FLONASE ) 50 MCG/ACT nasal spray, Place 2 sprays into both nostrils daily., Disp: 48 mL, Rfl: 3   Multiple Vitamin (MULTIVITAMIN) tablet, Take 1 tablet by mouth daily., Disp: , Rfl:    omeprazole  (PRILOSEC) 20 MG capsule, Take 1 capsule (20 mg total) by mouth daily., Disp: 90 capsule, Rfl: 3  "

## 2024-09-14 ENCOUNTER — Other Ambulatory Visit (HOSPITAL_COMMUNITY): Payer: Self-pay

## 2024-09-14 ENCOUNTER — Telehealth: Payer: Self-pay

## 2024-09-14 ENCOUNTER — Ambulatory Visit: Payer: Self-pay | Admitting: Family

## 2024-09-14 NOTE — Telephone Encounter (Signed)
 Pharmacy Patient Advocate Encounter   Received notification from Onbase that prior authorization for tirzepatide  (MOUNJARO ) 2.5 MG/0.5ML Pen  is required/requested.   Insurance verification completed.   The patient is insured through [facility] ADVANTAGE/RX ADVANCE.   Per test claim: PA required; PA submitted to above mentioned insurance via Latent Key/confirmation #/EOC [id number] Status is pending

## 2024-09-15 ENCOUNTER — Other Ambulatory Visit (HOSPITAL_COMMUNITY): Payer: Self-pay

## 2024-09-15 NOTE — Telephone Encounter (Signed)
 Pharmacy Patient Advocate Encounter  Received notification from [facility] ADVANTAGE/RX ADVANCE that Prior Authorization for tirzepatide  (MOUNJARO ) 2.5 MG/0.5ML Pen has been APPROVED from 09/15/24 to 09/15/25. Unable to obtain price due to refill too soon rejection, last fill date 09/15/24 next available fill date01/21/26   PA #/Case ID/Reference #: [id number]

## 2024-09-19 ENCOUNTER — Encounter: Payer: Self-pay | Admitting: Family

## 2024-09-27 ENCOUNTER — Encounter: Payer: Self-pay | Admitting: *Deleted

## 2024-09-30 ENCOUNTER — Ambulatory Visit: Payer: PPO

## 2024-10-01 ENCOUNTER — Ambulatory Visit: Payer: PPO

## 2024-10-04 ENCOUNTER — Ambulatory Visit

## 2024-10-04 VITALS — BP 121/70 | Ht 64.75 in | Wt 155.0 lb

## 2024-10-04 DIAGNOSIS — Z0000 Encounter for general adult medical examination without abnormal findings: Secondary | ICD-10-CM

## 2024-10-04 NOTE — Patient Instructions (Signed)
 [name],  Thank you for taking the time for your Medicare Wellness Visit. I appreciate your continued commitment to your health goals. Please review the care plan we discussed, and feel free to reach out if I can assist you further.  Please note that Annual Wellness Visits do not include a physical exam. Some assessments may be limited, especially if the visit was conducted virtually. If needed, we may recommend an in-person follow-up with your provider.  Ongoing Care Seeing your primary care provider every 3 to 6 months helps us  monitor your health and provide consistent, personalized care.   Referrals If a referral was made during today's visit and you haven't received any updates within two weeks, please contact the referred provider directly to check on the status.  Recommended Screenings:  Health Maintenance  Topic Date Due   Complete foot exam   Never done   Medicare Annual Wellness Visit  09/29/2024   Flu Shot  12/14/2024*   Yearly kidney function blood test for diabetes  12/29/2024   Kidney health urinalysis for diabetes  12/29/2024   Eye exam for diabetics  01/19/2025   Hemoglobin A1C  03/14/2025   Breast Cancer Screening  03/01/2026   Colon Cancer Screening  03/07/2027   DTaP/Tdap/Td vaccine (4 - Td or Tdap) 10/09/2033   Pneumococcal Vaccine for age over 27  Completed   Osteoporosis screening with Bone Density Scan  Completed   Meningitis B Vaccine  Aged Out   COVID-19 Vaccine  Discontinued   Hepatitis C Screening  Discontinued   Cologuard (Stool DNA test)  Discontinued   Zoster (Shingles) Vaccine  Discontinued  *Topic was postponed. The date shown is not the original due date.       10/04/2024    1:07 PM  Advanced Directives  Does Patient Have a Medical Advance Directive? No  Would patient like information on creating a medical advance directive? No - Patient declined    Vision: Annual vision screenings are recommended for early detection of glaucoma,  cataracts, and diabetic retinopathy. These exams can also reveal signs of chronic conditions such as diabetes and high blood pressure.  Dental: Annual dental screenings help detect early signs of oral cancer, gum disease, and other conditions linked to overall health, including heart disease and diabetes.  Please see the attached documents for additional preventive care recommendations.

## 2024-10-04 NOTE — Progress Notes (Signed)
 " I connected with  [name] on 10/04/24 by a audio enabled telemedicine application and verified that I am speaking with the correct person using two identifiers.  Patient Location: Home  Provider Location: Home Office  Persons Participating in Visit: Patient.  I discussed the limitations of evaluation and management by telemedicine. The patient expressed understanding and agreed to proceed.  Vital Signs: Because this visit was a virtual/telehealth visit, some criteria may be missing or patient reported. Any vitals not documented were not able to be obtained and vitals that have been documented are patient reported.  Chief Complaint  Patient presents with   Medicare Wellness     Subjective:   [name] is a 69 y.o. female who presents for a Medicare Annual Wellness Visit.  Visit info / Clinical Intake: Medicare Wellness Visit Type:: Subsequent Annual Wellness Visit Persons participating in visit and providing information:: patient Medicare Wellness Visit Mode:: Telephone If telephone:: video declined Since this visit was completed virtually, some vitals may be partially provided or unavailable. Missing vitals are due to the limitations of the virtual format.: Documented vitals are patient reported If Telephone or Video please confirm:: I connected with patient using audio/video enable telemedicine. I verified patient identity with two identifiers, discussed telehealth limitations, and patient agreed to proceed. Patient Location:: home Provider Location:: home office Interpreter Needed?: No Pre-visit prep was completed: yes AWV questionnaire completed by patient prior to visit?: no Living arrangements:: lives with spouse/significant other Patient's Overall Health Status Rating: very good Typical amount of pain: none Does pain affect daily life?: no Are you currently prescribed opioids?: no  Dietary Habits and Nutritional Risks How many meals a day?: 2 Eats fruit and  vegetables daily?: yes Most meals are obtained by: preparing own meals In the last 2 weeks, have you had any of the following?: none Diabetic:: no Any non-healing wounds?: no How often do you check your BS?: as needed Would you like to be referred to a Nutritionist or for Diabetic Management? : no  Functional Status Activities of Daily Living (to include ambulation/medication): Independent Ambulation: Independent Medication Administration: Independent Home Management (perform basic housework or laundry): Independent Manage your own finances?: yes Primary transportation is: driving Concerns about vision?: no *vision screening is required for WTM* Concerns about hearing?: no  Fall Screening Falls in the past year?: 1 Number of falls in past year: 0 Was there an injury with Fall?: 0 Fall Risk Category Calculator: 1 Patient Fall Risk Level: Low Fall Risk  Fall Risk Patient at Risk for Falls Due to: History of fall(s) Fall risk Follow up: Falls evaluation completed; Education provided; Falls prevention discussed  Home and Transportation Safety: All rugs have non-skid backing?: yes All stairs or steps have railings?: yes Grab bars in the bathtub or shower?: yes Have non-skid surface in bathtub or shower?: yes Good home lighting?: yes Regular seat belt use?: yes Hospital stays in the last year:: no  Cognitive Assessment Difficulty concentrating, remembering, or making decisions? : no Will 6CIT or Mini Cog be Completed: yes What year is it?: 0 points What month is it?: 0 points Give patient an address phrase to remember (5 components): remember words apple, table,penny About what time is it?: 0 points Count backwards from 20 to 1: 0 points Say the months of the year in reverse: 0 points Repeat the address phrase from earlier: 0 points 6 CIT Score: 0 points  Advance Directives (For Healthcare) Does Patient Have a Medical Advance Directive?: No  Would patient like information  on creating a medical advance directive?: No - Patient declined  Reviewed/Updated  Reviewed/Updated: Reviewed All (Medical, Surgical, Family, Medications, Allergies, Care Teams, Patient Goals)    Allergies (verified) Ezetimibe , Hydroxyzine , Lisinopril , Statins, Trazodone  and nefazodone, Creon  [pancrelipase  (lip-prot-amyl)], and Losartan    Current Medications (verified) Outpatient Encounter Medications as of 10/04/2024  Medication Sig   amLODipine  (NORVASC ) 5 MG tablet TAKE 1 TABLET (5 MG TOTAL) BY MOUTH DAILY.   azelastine (ASTELIN) 0.1 % nasal spray SPRAY 1-2 SPRAYS IN EACH NOSTRIL TWICE DAILY.   cyclobenzaprine  (FLEXERIL ) 5 MG tablet Take 1 tablet (5 mg total) by mouth 3 (three) times daily as needed for muscle spasms. Use 30 minutes prior to intercourse   DULoxetine  (CYMBALTA ) 60 MG capsule Take one 60 mg tablet along with one 30 mg tablet for total of 90 mg once daily   estradiol  (ESTRACE ) 0.01 % CREA vaginal cream Place 0.5 g vaginally 2 (two) times a week. Place 0.5g nightly for two weeks then twice a week after   Evolocumab  (REPATHA  SURECLICK) 140 MG/ML SOAJ INJECT 1 ML SUBCUTANEOUSLY EVERY 2 WEEKS IN ABDOMEN,THIGH,OR OUTER AREA OF UPPER ARM (ROTATE SITES)   fluticasone  (FLONASE ) 50 MCG/ACT nasal spray Place 2 sprays into both nostrils daily.   Multiple Vitamin (MULTIVITAMIN) tablet Take 1 tablet by mouth daily.   omeprazole  (PRILOSEC) 20 MG capsule Take 1 capsule (20 mg total) by mouth daily.   tirzepatide  (MOUNJARO ) 2.5 MG/0.5ML Pen Inject 2.5 mg into the skin once a week.   No facility-administered encounter medications on file as of 10/04/2024.    History: Past Medical History:  Diagnosis Date   Anemia    Anxiety    Arthritis    Blood transfusion without reported diagnosis    Breast cancer (HCC) 02/27/2007   Cohen   COVID-19 08/30/2020   GERD (gastroesophageal reflux disease)    Hypercholesterolemia    Hypertension    Migraine    Hx of in past   Personal history of  radiation therapy 2008   Cohen   Wears dentures    partial upper and lower   Past Surgical History:  Procedure Laterality Date   ABDOMINAL HYSTERECTOMY  1983   BREAST BIOPSY Cohen 02/27/2007   BREAST LUMPECTOMY Cohen 04/02/2007   BREAST SURGERY Left 04/02/2007   breast reduction left side   BROW LIFT Bilateral 06/17/2017   Procedure: BLEPHAROPLASTY UPPER EYELID WITH EXCESS SKIN;  Surgeon: Ashley Greig HERO, MD;  Location: [facility];  Service: Ophthalmology;  Laterality: Bilateral;   BROW LIFT Bilateral 11/30/2021   Procedure: BROW PTOSIS REPAIR BILATERAL;  Surgeon: Ashley Greig HERO, MD;  Location: [facility];  Service: Ophthalmology;  Laterality: Bilateral;   CESAREAN SECTION     two times   COLONOSCOPY     COSMETIC SURGERY     Breast Left side reduction   EYE SURGERY  2020   Saggy skin enterferring with sight   FOOT SURGERY Cohen    PTOSIS REPAIR Bilateral 06/17/2017   Procedure: PTOSIS REPAIR RESECT EX;  Surgeon: Ashley Greig HERO, MD;  Location: [facility];  Service: Ophthalmology;  Laterality: Bilateral;   REDUCTION MAMMAPLASTY Left    SHOULDER ARTHROSCOPY WITH ROTATOR CUFF REPAIR AND OPEN BICEPS TENODESIS Cohen 02/01/2019   Procedure: Cohen SHOULDER ARTHROSCOPY, DEBRIDEMENT, BICEPS TENODESIS, MINI ROTATOR CUFF TEAR REPAIR;  Surgeon: Addie Cordella Hamilton, MD;  Location: [facility];  Service: Orthopedics;  Laterality: Cohen;   TUBAL LIGATION  1981   Family  History  Problem Relation Age of Onset   Thyroid  disease Mother    Hypertension Mother    Hyperlipidemia Mother    Alzheimer's disease Mother    Heart failure Mother    ADD / ADHD Mother    Anxiety disorder Mother    Depression Mother    Kidney disease Mother    Stroke Mother    Varicose Veins Mother    Esophageal cancer Father    Hyperlipidemia Father    Heart murmur Father    Colon polyps Father    COPD Father    Aneurysm Father    Throat cancer Father    Diabetes Sister     Arthritis Sister    Depression Sister    Obesity Sister    Varicose Veins Sister    Breast cancer Sister    Asthma Sister    Cancer Sister    Irritable bowel syndrome Sister    Breast cancer Sister    Ulcerative colitis Sister    Rheum arthritis Sister    Alzheimer's disease Maternal Aunt    Alzheimer's disease Maternal Uncle    Diabetes Paternal Uncle    Alzheimer's disease Maternal Grandmother    Diabetes Paternal Grandmother    Stroke Paternal Grandmother    Breast cancer Cousin    Breast cancer Cousin    ADD / ADHD Daughter    ADD / ADHD Daughter    Alcohol abuse Brother    Cancer Sister    COPD Sister    Depression Sister    Obesity Sister    ADD / ADHD Daughter    ADD / ADHD Daughter    Alcohol abuse Brother    COPD Sister    Depression Sister    Obesity Sister    Colon cancer Neg Hx    Stomach cancer Neg Hx    Rectal cancer Neg Hx    Social History   Occupational History    Employer: retired  Tobacco Use   Smoking status: Never   Smokeless tobacco: Never  Vaping Use   Vaping status: Never Used  Substance and Sexual Activity   Alcohol use: Not Currently   Drug use: No   Sexual activity: Yes    Partners: Male    Birth control/protection: Surgical   Tobacco Counseling Counseling given: Not Answered  SDOH Screenings   Food Insecurity: No Food Insecurity (10/04/2024)  Housing: Low Risk (10/04/2024)  Transportation Needs: No Transportation Needs (10/04/2024)  Utilities: Not At Risk (10/04/2024)  Alcohol Screen: Low Risk (09/30/2023)  Depression (PHQ2-9): Low Risk (10/04/2024)  Financial Resource Strain: Low Risk (07/05/2024)  Physical Activity: Sufficiently Active (10/04/2024)  Social Connections: Socially Integrated (10/04/2024)  Stress: No Stress Concern Present (10/04/2024)  Tobacco Use: Low Risk (10/04/2024)  Health Literacy: Adequate Health Literacy (10/04/2024)   See flowsheets for full screening details  Depression Screen PHQ 2 & 9 Depression  Scale- Over the past 2 weeks, how often have you been bothered by any of the following problems? Little interest or pleasure in doing things: 0 Feeling down, depressed, or hopeless (PHQ Adolescent also includes...irritable): 0 PHQ-2 Total Score: 0 Trouble falling or staying asleep, or sleeping too much: 1 Feeling tired or having little energy: 0 Poor appetite or overeating (PHQ Adolescent also includes...weight loss): 0 Feeling bad about yourself - or that you are a failure or have let yourself or your family down: 0 Trouble concentrating on things, such as reading the newspaper or watching television (PHQ Adolescent also includes...like school  work): 0 Moving or speaking so slowly that other people could have noticed. Or the opposite - being so fidgety or restless that you have been moving around a lot more than usual: 0 Thoughts that you would be better off dead, or of hurting yourself in some way: 0 PHQ-9 Total Score: 1 If you checked off any problems, how difficult have these problems made it for you to do your work, take care of things at home, or get along with other people?: Not difficult at all  Depression Treatment Depression Interventions/Treatment : EYV7-0 Score <4 Follow-up Not Indicated     Goals Addressed               This Visit's Progress     Patient Stated (pt-stated)        Lose 20 lbs              Objective:    Today's Vitals   10/04/24 1303  BP: 121/70  Weight: 155 lb (70.3 kg)  Height: 5' 4.75 (1.645 m)   Body mass index is 25.99 kg/m.  Hearing/Vision screen Hearing Screening - Comments:: No difficulties. Vision Screening - Comments:: Patient wears glasses . Patient sees [facility]  Immunizations and Health Maintenance Health Maintenance  Topic Date Due   FOOT EXAM  Never done   Medicare Annual Wellness (AWV)  09/29/2024   Influenza Vaccine  12/14/2024 (Originally 04/16/2024)   Diabetic kidney evaluation - eGFR measurement  12/29/2024    Diabetic kidney evaluation - Urine ACR  12/29/2024   OPHTHALMOLOGY EXAM  01/19/2025   HEMOGLOBIN A1C  03/14/2025   Mammogram  03/01/2026   Colonoscopy  03/07/2027   DTaP/Tdap/Td (4 - Td or Tdap) 10/09/2033   Pneumococcal Vaccine: 50+ Years  Completed   Bone Density Scan  Completed   Meningococcal B Vaccine  Aged Out   COVID-19 Vaccine  Discontinued   Hepatitis C Screening  Discontinued   Fecal DNA (Cologuard)  Discontinued   Zoster Vaccines- Shingrix  Discontinued        Assessment/Plan:  This is a routine wellness examination for [name].  Patient Care Team: Corwin Antu, FNP as PCP - General (Family Medicine) End, Lonni, MD as PCP - Cardiology (Cardiology) Fate Morna SAILOR, [facility] (Inactive) as Pharmacist (Pharmacist) Myrna Adine Anes, MD as Consulting Physician (Ophthalmology)  I have personally reviewed and noted the following in the patients chart:   Medical and social history Use of alcohol, tobacco or illicit drugs  Current medications and supplements including opioid prescriptions. Functional ability and status Nutritional status Physical activity Advanced directives List of other physicians Hospitalizations, surgeries, and ER visits in previous 12 months Vitals Screenings to include cognitive, depression, and falls Referrals and appointments  No orders of the defined types were placed in this encounter.  In addition, I have reviewed and discussed with patient certain preventive protocols, quality metrics, and best practice recommendations. A written personalized care plan for preventive services as well as general preventive health recommendations were provided to patient.   [name], [place]   10/04/2024   No follow-ups on file.  After Visit Summary: (MyChart) Due to this being a telephonic visit, the after visit summary with patients personalized plan was offered to patient via MyChart   Nurse Notes:No concerns  "

## 2024-10-05 ENCOUNTER — Ambulatory Visit: Admitting: Clinical

## 2024-10-05 DIAGNOSIS — F4323 Adjustment disorder with mixed anxiety and depressed mood: Secondary | ICD-10-CM

## 2024-10-05 NOTE — Progress Notes (Signed)
   Darice Seats, LCSW

## 2024-10-05 NOTE — Progress Notes (Signed)
 "  [facility] Counselor/Therapist Progress Note  Patient ID: [name], MRN: [id number],    Date: 10/05/2024  Time Spent: 8:41am - 9:29am : 48 minutes   Treatment Type: Individual Therapy  Reported Symptoms: anxiety  Mental Status Exam: Appearance:  Neat and Well Groomed     Behavior: Appropriate  Motor: Normal  Speech/Language:  Clear and Coherent and Normal Rate  Affect: Appropriate  Mood: normal  Thought process: normal  Thought content:   WNL  Sensory/Perceptual disturbances:   WNL  Orientation: oriented to person, place, time/date, and situation  Attention: Good  Concentration: Good  Memory: WNL  Fund of knowledge:  Good  Insight:   Good  Judgment:  Good  Impulse Control: Good   Risk Assessment: Danger to Self:  No Patient denied current suicidal ideation  Self-injurious Behavior: No Danger to Others: No Patient denied current homicidal ideation Duty to Warn:no Physical Aggression / Violence:No  Access to Firearms a concern: No  Gang Involvement:No   Subjective: Patient reported patient's husband was informed last week that his mother has 3 months to live.  Patient reported good, I think I've been doing pretty good in response to mood since last session. Patient reported no questions regarding diagnosis. Patient stated, I care a lot about people and I carry that with me. Patient stated, I think that's great, I do totally agree with it in response to treatment recommendations.  Patient reported patient's daughter is in the process of entering into a 84 day treatment program in [place], [place]. Patient reported patient's daughter is currently working with [facility]. Patient stated, to be able to say no and it's ok to say that and not feel bad in response to goals for therapy. Patient stated, To learn that I can't do everything for everybody in response to goals for therapy.   Interventions: Motivational Interviewing. Clinician conducted session  in person at clinician's office at [facility]. Clinician reviewed diagnosis and treatment recommendations; and provided psycho education related to diagnosis and treatment as part of treatment planning process. Clinician utilized motivational interviewing to explore potential goals for therapy. Clinician utilized a task centered approach in collaboration with patient to develop treatment plan. Patient participated in development of treatment plan and verbally agreed to treatment plan.  Diagnosis:Adjustment disorder with mixed anxiety and depressed mood  Collaboration of Care: Primary Care Provider AEB Discussed consent for patient's PCP, Tabitha Dugal, FNP  Patient/Guardian was advised Release of Information must be obtained prior to any record release in order to collaborate their care with an outside provider. Patient/Guardian was advised if they have not already done so to contact [facility] to sign all necessary forms in order for us  to release information regarding their care.   Plan: Behavioral Health Treatment Plan   Name:[name] Medicare  MRN: [id number]   Treatment Plan Development Date: 10/05/24   Strengths: Supportive Relationships, Family, and Spirituality  Supports: Spouse and sister   Client Statement of Needs: At the time of assessment patient stated, I've been dealing with my oldest daughter for several years now, dealing with a lot of things I never thought she'd do, I think it bothers me health wise, I'm trying to see if I can learn boundaries and how to deal with her, seeing if I can learn how to try to let her grow up in reference to patient's daughter.     Treatment Level: outpatient therapy  Client Treatment Preferences: in person sessions  Diagnosis: Adjustment Disorder  Adjustment disorder with mixed anxiety and depressed mood  Symptoms:  The development of emotional or behavioral symptoms in response  to an identifiable stressor(s) occurring within 3 months of the onset of the stressor(s). and These symptoms or behaviors are clinically significant, as evidenced by one or both of the following:The stress-related disturbance does not meet the criteria for another mental disorder and is not merely an exacerbation of a preexisting mental disorder.  Goals:  To establish and maintain healthy boundaries with family members, practice effective communication strategies for patient to utilize when expressing her thoughts and feelings to others in a controlled and assertive way, Return to previous functioning and reduce or eliminate symptoms., Reduce feelings of sadness and hopelessness thereby improving mood., Reduce anxiety and stress using cognitive and behavioral techniques to manage symptoms., and Improve coping skills by learning effective ways to adapt and manage anxiety.  Objectives: Target Date For All Objectives: 10/05/25  Enhance problem-solving abilities and develop skills to address and resolve challenges., Promote positive behavior change and encourage healthy and productive activities., and Build resilience and learn to adapt to stress and hardships.  Progress Documentation:   Established 10/05/24  Interventions:  Cognitive Behavioral Therapy, Assertiveness/Communication, Interpersonal, and problem solving   Expected duration of treatment: 12 months  Party responsible for implementation of interventions: Darice Seats, MSW, LCSW, therapist and [name], patient.   This plan has been reviewed and created by the following participants: Darice Seats, MSW, LCSW, therapist and [name], patient.   A new plan will be created at least every 12 months.  The patient fully participated in the development of treatment plan with the clinician and verbally consents to such treatment.   Patient Treatment Plan Signature Obtained: Yes, please see shared S: Drive for sign off.    Darice Seats, LCSW     "

## 2024-10-05 NOTE — Progress Notes (Signed)
 Behavioral Health Treatment Plan   Name:[name] Medicare  MRN: [id number]   Treatment Plan Development Date: 10/05/24   Strengths: Supportive Relationships, Family, and Spirituality  Supports: Spouse and sister   Client Statement of Needs: At the time of assessment patient stated, I've been dealing with my oldest daughter for several years now, dealing with a lot of things I never thought she'd do, I think it bothers me health wise, I'm trying to see if I can learn boundaries and how to deal with her, seeing if I can learn how to try to let her grow up in reference to patient's daughter.     Treatment Level: outpatient therapy  Client Treatment Preferences: in person sessions   Diagnosis: Adjustment Disorder  Adjustment disorder with mixed anxiety and depressed mood  Symptoms:  The development of emotional or behavioral symptoms in response to an identifiable stressor(s) occurring within 3 months of the onset of the stressor(s). and These symptoms or behaviors are clinically significant, as evidenced by one or both of the following:The stress-related disturbance does not meet the criteria for another mental disorder and is not merely an exacerbation of a preexisting mental disorder.  Goals:  To establish and maintain healthy boundaries with family members, practice effective communication strategies for patient to utilize when expressing her thoughts and feelings to others in a controlled and assertive way, Return to previous functioning and reduce or eliminate symptoms., Reduce feelings of sadness and hopelessness thereby improving mood., Reduce anxiety and stress using cognitive and behavioral techniques to manage symptoms., and Improve coping skills by learning effective ways to adapt and manage anxiety.  Objectives: Target Date For All Objectives: 10/05/25  Enhance problem-solving abilities and develop skills to address and resolve challenges.,  Promote positive behavior change and encourage healthy and productive activities., and Build resilience and learn to adapt to stress and hardships.  Progress Documentation:   Established 10/05/24  Interventions:  Cognitive Behavioral Therapy, Assertiveness/Communication, Interpersonal, and problem solving   Expected duration of treatment: 12 months  Party responsible for implementation of interventions: Darice Seats, MSW, LCSW, therapist and Myrel Rappleye, patient.   This plan has been reviewed and created by the following participants: Darice Seats, MSW, LCSW, therapist and Imogean Ciampa, patient.   A new plan will be created at least every 12 months.  The patient fully participated in the development of treatment plan with the clinician and verbally consents to such treatment.   Patient Treatment Plan Signature Obtained: Yes, please see shared S: Drive for sign off.                  Darice Seats, LCSW

## 2024-10-09 ENCOUNTER — Other Ambulatory Visit: Payer: Self-pay | Admitting: Family

## 2024-10-09 DIAGNOSIS — F419 Anxiety disorder, unspecified: Secondary | ICD-10-CM

## 2024-10-19 ENCOUNTER — Encounter: Payer: Self-pay | Admitting: Physical Therapy

## 2024-10-19 ENCOUNTER — Ambulatory Visit: Admitting: Physical Therapy

## 2024-10-19 DIAGNOSIS — M533 Sacrococcygeal disorders, not elsewhere classified: Secondary | ICD-10-CM

## 2024-10-19 DIAGNOSIS — M546 Pain in thoracic spine: Secondary | ICD-10-CM

## 2024-10-19 DIAGNOSIS — R2689 Other abnormalities of gait and mobility: Secondary | ICD-10-CM

## 2024-10-19 NOTE — Patient Instructions (Signed)
 Avoid straining pelvic floor, abdominal muscles , spine  Use log rolling technique instead of getting out of bed with your neck or the sit-up  Log rolling into and out of bed Log rolling into and out of bed If getting out of bed on R side, Bent knees, scoot hips/ shoulder to L  Raise R arm completely overhead, rolling onto armpit  Then lower bent knees to bed to get into complete side lying position  Then drop legs off bed, and push up onto R elbow/forearm, and use L hand to push onto the bed __ Proper body mechanics with getting out of a chair to decrease strain  on back &pelvic floor    Avoid holding your breath when Getting out of the chair:  Scoot to front part of chair Feet are hip width apart,  Knees / feet slightly out in a V, Heels behind knees, feet are hip width apart, nose over toes  Inhale like you are smelling roses Exhale to stand , keeping weight across the ballmounds AND heels , navel over ballmounds  not just in the heels and not locking knees with navel shifting back _   Sitting with feet on ground, four points of contact Catch yourself crossing ankles and thighs

## 2024-10-26 ENCOUNTER — Ambulatory Visit: Admitting: Physical Therapy

## 2024-11-02 ENCOUNTER — Ambulatory Visit: Admitting: Physical Therapy

## 2024-11-08 ENCOUNTER — Ambulatory Visit: Admitting: Obstetrics and Gynecology

## 2024-11-09 ENCOUNTER — Ambulatory Visit: Admitting: Physical Therapy

## 2024-11-11 ENCOUNTER — Ambulatory Visit: Admitting: Clinical

## 2024-11-11 ENCOUNTER — Ambulatory Visit: Admitting: Obstetrics and Gynecology

## 2024-11-16 ENCOUNTER — Ambulatory Visit: Admitting: Physical Therapy

## 2024-11-23 ENCOUNTER — Ambulatory Visit: Admitting: Physical Therapy

## 2024-11-24 ENCOUNTER — Ambulatory Visit: Admitting: Clinical

## 2024-11-30 ENCOUNTER — Ambulatory Visit: Admitting: Physical Therapy

## 2024-12-07 ENCOUNTER — Ambulatory Visit: Admitting: Physical Therapy

## 2024-12-07 ENCOUNTER — Encounter: Admitting: Family

## 2024-12-14 ENCOUNTER — Ambulatory Visit: Admitting: Physical Therapy

## 2024-12-21 ENCOUNTER — Ambulatory Visit: Admitting: Physical Therapy

## 2024-12-28 ENCOUNTER — Ambulatory Visit: Admitting: Physical Therapy
